# Patient Record
Sex: Male | Born: 1975
Health system: Southern US, Community
[De-identification: ages and names within clinical notes are randomized; demographics above are authoritative.]

## PROBLEM LIST (undated history)

## (undated) DIAGNOSIS — I10 Essential (primary) hypertension: Secondary | ICD-10-CM

## (undated) DIAGNOSIS — J309 Allergic rhinitis, unspecified: Secondary | ICD-10-CM

## (undated) DIAGNOSIS — E785 Hyperlipidemia, unspecified: Secondary | ICD-10-CM

## (undated) DIAGNOSIS — I509 Heart failure, unspecified: Secondary | ICD-10-CM

## (undated) DIAGNOSIS — M7660 Achilles tendinitis, unspecified leg: Secondary | ICD-10-CM

## (undated) DIAGNOSIS — M722 Plantar fascial fibromatosis: Secondary | ICD-10-CM

## (undated) DIAGNOSIS — E669 Obesity, unspecified: Secondary | ICD-10-CM

## (undated) DIAGNOSIS — I428 Other cardiomyopathies: Secondary | ICD-10-CM

## (undated) DIAGNOSIS — G4730 Sleep apnea, unspecified: Secondary | ICD-10-CM

## (undated) DIAGNOSIS — K219 Gastro-esophageal reflux disease without esophagitis: Secondary | ICD-10-CM

## (undated) DIAGNOSIS — I2510 Atherosclerotic heart disease of native coronary artery without angina pectoris: Secondary | ICD-10-CM

## (undated) HISTORY — DX: Heart failure, unspecified: I50.9

## (undated) HISTORY — DX: Hyperlipidemia, unspecified: E78.5

## (undated) HISTORY — DX: Other cardiomyopathies: I42.8

## (undated) HISTORY — DX: Atherosclerotic heart disease of native coronary artery without angina pectoris: I25.10

## (undated) HISTORY — DX: Gastro-esophageal reflux disease without esophagitis: K21.9

## (undated) HISTORY — DX: Allergic rhinitis, unspecified: J30.9

## (undated) HISTORY — DX: Obesity, unspecified: E66.9

## (undated) HISTORY — PX: NO PAST SURGERIES: SHX2092

## (undated) HISTORY — DX: Plantar fascial fibromatosis: M72.2

## (undated) HISTORY — DX: Achilles tendinitis, unspecified leg: M76.60

## (undated) HISTORY — DX: Essential (primary) hypertension: I10

## (undated) HISTORY — DX: Sleep apnea, unspecified: G47.30

---

## 1999-09-11 ENCOUNTER — Encounter: Payer: Self-pay | Admitting: Emergency Medicine

## 1999-09-11 ENCOUNTER — Emergency Department (HOSPITAL_COMMUNITY): Admission: EM | Admit: 1999-09-11 | Discharge: 1999-09-11 | Payer: Self-pay | Admitting: Emergency Medicine

## 2000-02-25 ENCOUNTER — Emergency Department (HOSPITAL_COMMUNITY): Admission: EM | Admit: 2000-02-25 | Discharge: 2000-02-25 | Payer: Self-pay | Admitting: Emergency Medicine

## 2001-07-23 ENCOUNTER — Encounter: Payer: Self-pay | Admitting: Emergency Medicine

## 2001-07-23 ENCOUNTER — Emergency Department (HOSPITAL_COMMUNITY): Admission: EM | Admit: 2001-07-23 | Discharge: 2001-07-23 | Payer: Self-pay | Admitting: Emergency Medicine

## 2002-07-09 ENCOUNTER — Emergency Department (HOSPITAL_COMMUNITY): Admission: EM | Admit: 2002-07-09 | Discharge: 2002-07-09 | Payer: Self-pay | Admitting: Emergency Medicine

## 2002-07-09 ENCOUNTER — Encounter: Payer: Self-pay | Admitting: Emergency Medicine

## 2002-07-11 ENCOUNTER — Encounter: Payer: Self-pay | Admitting: Internal Medicine

## 2002-07-11 ENCOUNTER — Inpatient Hospital Stay (HOSPITAL_COMMUNITY): Admission: AD | Admit: 2002-07-11 | Discharge: 2002-07-14 | Payer: Self-pay | Admitting: Internal Medicine

## 2002-11-11 ENCOUNTER — Encounter: Admission: RE | Admit: 2002-11-11 | Discharge: 2002-11-11 | Payer: Self-pay | Admitting: Occupational Medicine

## 2002-11-11 ENCOUNTER — Encounter: Payer: Self-pay | Admitting: Occupational Medicine

## 2003-04-19 ENCOUNTER — Emergency Department (HOSPITAL_COMMUNITY): Admission: AD | Admit: 2003-04-19 | Discharge: 2003-04-19 | Payer: Self-pay | Admitting: Emergency Medicine

## 2003-05-11 ENCOUNTER — Emergency Department (HOSPITAL_COMMUNITY): Admission: AD | Admit: 2003-05-11 | Discharge: 2003-05-11 | Payer: Self-pay | Admitting: Family Medicine

## 2003-06-01 ENCOUNTER — Emergency Department (HOSPITAL_COMMUNITY): Admission: AD | Admit: 2003-06-01 | Discharge: 2003-06-01 | Payer: Self-pay | Admitting: Family Medicine

## 2003-06-02 ENCOUNTER — Emergency Department (HOSPITAL_COMMUNITY): Admission: AD | Admit: 2003-06-02 | Discharge: 2003-06-02 | Payer: Self-pay | Admitting: Family Medicine

## 2003-07-21 ENCOUNTER — Emergency Department (HOSPITAL_COMMUNITY): Admission: AD | Admit: 2003-07-21 | Discharge: 2003-07-21 | Payer: Self-pay | Admitting: Family Medicine

## 2004-03-05 ENCOUNTER — Emergency Department (HOSPITAL_COMMUNITY): Admission: EM | Admit: 2004-03-05 | Discharge: 2004-03-05 | Payer: Self-pay | Admitting: Emergency Medicine

## 2004-03-28 ENCOUNTER — Emergency Department (HOSPITAL_COMMUNITY): Admission: EM | Admit: 2004-03-28 | Discharge: 2004-03-28 | Payer: Self-pay | Admitting: Family Medicine

## 2004-05-09 ENCOUNTER — Emergency Department (HOSPITAL_COMMUNITY): Admission: EM | Admit: 2004-05-09 | Discharge: 2004-05-09 | Payer: Self-pay | Admitting: Family Medicine

## 2004-06-23 ENCOUNTER — Emergency Department (HOSPITAL_COMMUNITY): Admission: EM | Admit: 2004-06-23 | Discharge: 2004-06-23 | Payer: Self-pay | Admitting: Emergency Medicine

## 2004-06-30 ENCOUNTER — Ambulatory Visit: Payer: Self-pay | Admitting: Cardiology

## 2004-09-16 ENCOUNTER — Ambulatory Visit: Payer: Self-pay

## 2004-09-16 ENCOUNTER — Ambulatory Visit: Payer: Self-pay | Admitting: Internal Medicine

## 2004-09-17 ENCOUNTER — Ambulatory Visit: Payer: Self-pay

## 2004-09-21 ENCOUNTER — Ambulatory Visit: Payer: Self-pay | Admitting: Cardiology

## 2004-10-04 ENCOUNTER — Inpatient Hospital Stay (HOSPITAL_BASED_OUTPATIENT_CLINIC_OR_DEPARTMENT_OTHER): Admission: RE | Admit: 2004-10-04 | Discharge: 2004-10-04 | Payer: Self-pay | Admitting: Cardiology

## 2004-10-04 ENCOUNTER — Ambulatory Visit: Payer: Self-pay | Admitting: Cardiology

## 2004-10-07 ENCOUNTER — Inpatient Hospital Stay (HOSPITAL_BASED_OUTPATIENT_CLINIC_OR_DEPARTMENT_OTHER): Admission: RE | Admit: 2004-10-07 | Discharge: 2004-10-07 | Payer: Self-pay | Admitting: Cardiology

## 2004-10-07 ENCOUNTER — Ambulatory Visit: Payer: Self-pay | Admitting: Cardiology

## 2004-10-14 ENCOUNTER — Ambulatory Visit: Payer: Self-pay

## 2004-11-11 ENCOUNTER — Ambulatory Visit: Payer: Self-pay | Admitting: Cardiology

## 2005-01-30 ENCOUNTER — Emergency Department (HOSPITAL_COMMUNITY): Admission: EM | Admit: 2005-01-30 | Discharge: 2005-01-30 | Payer: Self-pay | Admitting: Family Medicine

## 2005-03-03 ENCOUNTER — Ambulatory Visit: Payer: Self-pay | Admitting: Internal Medicine

## 2005-03-21 ENCOUNTER — Ambulatory Visit: Payer: Self-pay | Admitting: Cardiology

## 2005-03-21 ENCOUNTER — Ambulatory Visit: Payer: Self-pay

## 2005-04-07 ENCOUNTER — Ambulatory Visit: Payer: Self-pay | Admitting: Internal Medicine

## 2005-06-17 ENCOUNTER — Emergency Department (HOSPITAL_COMMUNITY): Admission: EM | Admit: 2005-06-17 | Discharge: 2005-06-17 | Payer: Self-pay | Admitting: Family Medicine

## 2005-06-22 ENCOUNTER — Ambulatory Visit: Payer: Self-pay | Admitting: Cardiology

## 2005-07-06 ENCOUNTER — Ambulatory Visit: Payer: Self-pay

## 2005-08-10 ENCOUNTER — Ambulatory Visit: Payer: Self-pay | Admitting: Cardiology

## 2005-08-22 ENCOUNTER — Ambulatory Visit: Payer: Self-pay | Admitting: Internal Medicine

## 2005-12-30 ENCOUNTER — Emergency Department (HOSPITAL_COMMUNITY): Admission: EM | Admit: 2005-12-30 | Discharge: 2005-12-30 | Payer: Self-pay | Admitting: Family Medicine

## 2006-03-06 ENCOUNTER — Ambulatory Visit: Payer: Self-pay | Admitting: Cardiology

## 2006-04-06 ENCOUNTER — Ambulatory Visit: Payer: Self-pay | Admitting: Internal Medicine

## 2006-05-08 ENCOUNTER — Emergency Department (HOSPITAL_COMMUNITY): Admission: EM | Admit: 2006-05-08 | Discharge: 2006-05-08 | Payer: Self-pay | Admitting: Emergency Medicine

## 2006-07-19 ENCOUNTER — Ambulatory Visit: Payer: Self-pay | Admitting: Cardiology

## 2006-07-19 LAB — CONVERTED CEMR LAB
BUN: 12 mg/dL (ref 6–23)
CO2: 27 meq/L (ref 19–32)
Calcium: 8.8 mg/dL (ref 8.4–10.5)
Chloride: 105 meq/L (ref 96–112)
Creatinine, Ser: 1 mg/dL (ref 0.4–1.5)
GFR calc Af Amer: 111 mL/min
GFR calc non Af Amer: 92 mL/min
Glucose, Bld: 106 mg/dL — ABNORMAL HIGH (ref 70–99)
Potassium: 3.8 meq/L (ref 3.5–5.1)
Pro B Natriuretic peptide (BNP): 15 pg/mL (ref 0.0–100.0)
Sodium: 139 meq/L (ref 135–145)

## 2006-07-28 ENCOUNTER — Ambulatory Visit: Payer: Self-pay

## 2006-08-09 ENCOUNTER — Ambulatory Visit: Payer: Self-pay | Admitting: Endocrinology

## 2006-09-29 ENCOUNTER — Emergency Department (HOSPITAL_COMMUNITY): Admission: EM | Admit: 2006-09-29 | Discharge: 2006-09-29 | Payer: Self-pay | Admitting: Emergency Medicine

## 2006-10-20 ENCOUNTER — Ambulatory Visit: Payer: Self-pay | Admitting: Cardiology

## 2006-12-10 ENCOUNTER — Emergency Department (HOSPITAL_COMMUNITY): Admission: EM | Admit: 2006-12-10 | Discharge: 2006-12-10 | Payer: Self-pay | Admitting: Family Medicine

## 2007-03-12 ENCOUNTER — Encounter: Payer: Self-pay | Admitting: *Deleted

## 2007-03-12 DIAGNOSIS — I10 Essential (primary) hypertension: Secondary | ICD-10-CM

## 2007-03-12 DIAGNOSIS — E785 Hyperlipidemia, unspecified: Secondary | ICD-10-CM | POA: Insufficient documentation

## 2007-03-12 DIAGNOSIS — G4733 Obstructive sleep apnea (adult) (pediatric): Secondary | ICD-10-CM | POA: Insufficient documentation

## 2007-03-12 DIAGNOSIS — G4730 Sleep apnea, unspecified: Secondary | ICD-10-CM

## 2007-03-12 HISTORY — DX: Essential (primary) hypertension: I10

## 2007-03-12 HISTORY — DX: Hyperlipidemia, unspecified: E78.5

## 2007-03-12 HISTORY — DX: Sleep apnea, unspecified: G47.30

## 2007-05-30 ENCOUNTER — Ambulatory Visit: Payer: Self-pay | Admitting: Cardiology

## 2007-05-30 LAB — CONVERTED CEMR LAB
ALT: 38 units/L (ref 0–53)
AST: 26 units/L (ref 0–37)
Albumin: 3.7 g/dL (ref 3.5–5.2)
Alkaline Phosphatase: 59 units/L (ref 39–117)
BUN: 12 mg/dL (ref 6–23)
Bilirubin, Direct: 0.1 mg/dL (ref 0.0–0.3)
CO2: 29 meq/L (ref 19–32)
Calcium: 8.6 mg/dL (ref 8.4–10.5)
Chloride: 104 meq/L (ref 96–112)
Cholesterol: 178 mg/dL (ref 0–200)
Creatinine, Ser: 1 mg/dL (ref 0.4–1.5)
GFR calc Af Amer: 111 mL/min
GFR calc non Af Amer: 91 mL/min
Glucose, Bld: 102 mg/dL — ABNORMAL HIGH (ref 70–99)
HDL: 33.9 mg/dL — ABNORMAL LOW (ref >39.0)
LDL Cholesterol: 133 mg/dL — ABNORMAL HIGH (ref 0–99)
Potassium: 4.1 meq/L (ref 3.5–5.1)
Sodium: 139 meq/L (ref 135–145)
Total Bilirubin: 0.7 mg/dL (ref 0.3–1.2)
Total CHOL/HDL Ratio: 5.3
Total Protein: 7 g/dL (ref 6.0–8.3)
Triglycerides: 58 mg/dL (ref 0–149)
VLDL: 12 mg/dL (ref 0–40)

## 2007-12-04 ENCOUNTER — Ambulatory Visit: Payer: Self-pay | Admitting: Internal Medicine

## 2007-12-04 DIAGNOSIS — I509 Heart failure, unspecified: Secondary | ICD-10-CM | POA: Insufficient documentation

## 2007-12-04 DIAGNOSIS — I2510 Atherosclerotic heart disease of native coronary artery without angina pectoris: Secondary | ICD-10-CM

## 2007-12-04 HISTORY — DX: Heart failure, unspecified: I50.9

## 2007-12-04 HISTORY — DX: Atherosclerotic heart disease of native coronary artery without angina pectoris: I25.10

## 2007-12-09 ENCOUNTER — Emergency Department (HOSPITAL_COMMUNITY): Admission: EM | Admit: 2007-12-09 | Discharge: 2007-12-09 | Payer: Self-pay | Admitting: Emergency Medicine

## 2008-04-17 ENCOUNTER — Ambulatory Visit: Payer: Self-pay | Admitting: Internal Medicine

## 2008-04-28 ENCOUNTER — Ambulatory Visit: Payer: Self-pay | Admitting: Cardiology

## 2008-05-12 ENCOUNTER — Ambulatory Visit: Payer: Self-pay

## 2008-05-12 ENCOUNTER — Encounter: Payer: Self-pay | Admitting: Cardiology

## 2008-07-30 ENCOUNTER — Telehealth (INDEPENDENT_AMBULATORY_CARE_PROVIDER_SITE_OTHER): Payer: Self-pay | Admitting: *Deleted

## 2008-09-07 ENCOUNTER — Emergency Department (HOSPITAL_BASED_OUTPATIENT_CLINIC_OR_DEPARTMENT_OTHER): Admission: EM | Admit: 2008-09-07 | Discharge: 2008-09-07 | Payer: Self-pay | Admitting: Emergency Medicine

## 2008-09-17 ENCOUNTER — Ambulatory Visit: Payer: Self-pay | Admitting: Cardiology

## 2008-09-17 LAB — CONVERTED CEMR LAB
BUN: 14 mg/dL (ref 6–23)
CO2: 28 meq/L (ref 19–32)
Calcium: 8.8 mg/dL (ref 8.4–10.5)
Chloride: 108 meq/L (ref 96–112)
Creatinine, Ser: 0.9 mg/dL (ref 0.4–1.5)
GFR calc non Af Amer: 123.65 mL/min (ref >60)
Glucose, Bld: 77 mg/dL (ref 70–99)
Potassium: 3.8 meq/L (ref 3.5–5.1)
Sodium: 141 meq/L (ref 135–145)

## 2008-10-16 ENCOUNTER — Ambulatory Visit: Payer: Self-pay | Admitting: Internal Medicine

## 2008-10-16 DIAGNOSIS — M7660 Achilles tendinitis, unspecified leg: Secondary | ICD-10-CM | POA: Insufficient documentation

## 2008-10-16 HISTORY — DX: Achilles tendinitis, unspecified leg: M76.60

## 2008-10-21 ENCOUNTER — Encounter: Payer: Self-pay | Admitting: Internal Medicine

## 2008-10-21 LAB — CONVERTED CEMR LAB
ALT: 32 units/L (ref 0–53)
AST: 25 units/L (ref 0–37)
Albumin: 3.7 g/dL (ref 3.5–5.2)
Alkaline Phosphatase: 71 units/L (ref 39–117)
BUN: 9 mg/dL (ref 6–23)
Basophils Absolute: 0.1 10*3/uL (ref 0.0–0.1)
Basophils Relative: 0.7 % (ref 0.0–3.0)
Bilirubin Urine: NEGATIVE
Bilirubin, Direct: 0.1 mg/dL (ref 0.0–0.3)
CO2: 29 meq/L (ref 19–32)
Calcium: 8.7 mg/dL (ref 8.4–10.5)
Chloride: 109 meq/L (ref 96–112)
Cholesterol: 228 mg/dL — ABNORMAL HIGH (ref 0–200)
Creatinine, Ser: 0.9 mg/dL (ref 0.4–1.5)
Direct LDL: 164.4 mg/dL
Eosinophils Absolute: 0.4 10*3/uL (ref 0.0–0.7)
Eosinophils Relative: 4.7 % (ref 0.0–5.0)
GFR calc non Af Amer: 123.59 mL/min (ref >60)
Glucose, Bld: 76 mg/dL (ref 70–99)
HCT: 42.3 % (ref 39.0–52.0)
HDL: 28.8 mg/dL — ABNORMAL LOW (ref >39.00)
Hemoglobin: 14.4 g/dL (ref 13.0–17.0)
Ketones, ur: NEGATIVE mg/dL
Leukocytes, UA: NEGATIVE
Lymphocytes Relative: 32.7 % (ref 12.0–46.0)
Lymphs Abs: 2.6 10*3/uL (ref 0.7–4.0)
MCHC: 34.1 g/dL (ref 30.0–36.0)
MCV: 87.7 fL (ref 78.0–100.0)
Monocytes Absolute: 0.9 10*3/uL (ref 0.1–1.0)
Monocytes Relative: 11 % (ref 3.0–12.0)
Neutro Abs: 4.1 10*3/uL (ref 1.4–7.7)
Neutrophils Relative %: 50.9 % (ref 43.0–77.0)
Nitrite: NEGATIVE
Platelets: 213 10*3/uL (ref 150.0–400.0)
Potassium: 3.7 meq/L (ref 3.5–5.1)
RBC: 4.82 M/uL (ref 4.22–5.81)
RDW: 13.1 % (ref 11.5–14.6)
Sodium: 142 meq/L (ref 135–145)
Specific Gravity, Urine: 1.02 (ref 1.000–1.030)
TSH: 3.12 microintl units/mL (ref 0.35–5.50)
Total Bilirubin: 0.7 mg/dL (ref 0.3–1.2)
Total CHOL/HDL Ratio: 8
Total Protein, Urine: NEGATIVE mg/dL
Total Protein: 7.3 g/dL (ref 6.0–8.3)
Triglycerides: 178 mg/dL — ABNORMAL HIGH (ref 0.0–149.0)
Urine Glucose: NEGATIVE mg/dL
Urobilinogen, UA: 0.2 (ref 0.0–1.0)
VLDL: 35.6 mg/dL (ref 0.0–40.0)
WBC: 8.1 10*3/uL (ref 4.5–10.5)
pH: 6.5 (ref 5.0–8.0)

## 2008-11-24 ENCOUNTER — Telehealth: Payer: Self-pay | Admitting: Cardiology

## 2008-12-16 ENCOUNTER — Telehealth: Payer: Self-pay | Admitting: Cardiology

## 2009-01-06 ENCOUNTER — Ambulatory Visit: Payer: Self-pay | Admitting: Internal Medicine

## 2009-02-12 ENCOUNTER — Encounter (INDEPENDENT_AMBULATORY_CARE_PROVIDER_SITE_OTHER): Payer: Self-pay | Admitting: *Deleted

## 2009-04-01 ENCOUNTER — Ambulatory Visit: Payer: Self-pay | Admitting: Internal Medicine

## 2009-04-03 ENCOUNTER — Ambulatory Visit (HOSPITAL_COMMUNITY): Admission: RE | Admit: 2009-04-03 | Discharge: 2009-04-03 | Payer: Self-pay | Admitting: Internal Medicine

## 2009-04-21 DIAGNOSIS — E669 Obesity, unspecified: Secondary | ICD-10-CM

## 2009-04-21 DIAGNOSIS — I428 Other cardiomyopathies: Secondary | ICD-10-CM

## 2009-04-21 HISTORY — DX: Obesity, unspecified: E66.9

## 2009-04-21 HISTORY — DX: Other cardiomyopathies: I42.8

## 2009-05-09 ENCOUNTER — Ambulatory Visit: Payer: Self-pay | Admitting: Family Medicine

## 2009-05-11 ENCOUNTER — Ambulatory Visit: Payer: Self-pay | Admitting: Cardiology

## 2009-05-12 LAB — CONVERTED CEMR LAB
BUN: 11 mg/dL (ref 6–23)
CO2: 29 meq/L (ref 19–32)
Calcium: 8.5 mg/dL (ref 8.4–10.5)
Chloride: 106 meq/L (ref 96–112)
Creatinine, Ser: 0.9 mg/dL (ref 0.4–1.5)
GFR calc non Af Amer: 123.19 mL/min (ref >60)
Glucose, Bld: 118 mg/dL — ABNORMAL HIGH (ref 70–99)
Potassium: 3.6 meq/L (ref 3.5–5.1)
Sodium: 142 meq/L (ref 135–145)

## 2009-06-22 ENCOUNTER — Telehealth (INDEPENDENT_AMBULATORY_CARE_PROVIDER_SITE_OTHER): Payer: Self-pay | Admitting: *Deleted

## 2009-06-24 ENCOUNTER — Telehealth: Payer: Self-pay | Admitting: Cardiology

## 2009-08-13 ENCOUNTER — Ambulatory Visit: Payer: Self-pay | Admitting: Internal Medicine

## 2009-08-13 DIAGNOSIS — K5289 Other specified noninfective gastroenteritis and colitis: Secondary | ICD-10-CM | POA: Insufficient documentation

## 2009-08-13 DIAGNOSIS — J309 Allergic rhinitis, unspecified: Secondary | ICD-10-CM

## 2009-08-13 HISTORY — DX: Allergic rhinitis, unspecified: J30.9

## 2009-08-13 LAB — CONVERTED CEMR LAB
ALT: 28 units/L (ref 0–53)
AST: 20 units/L (ref 0–37)
Albumin: 3.7 g/dL (ref 3.5–5.2)
Alkaline Phosphatase: 70 units/L (ref 39–117)
BUN: 8 mg/dL (ref 6–23)
Basophils Absolute: 0 10*3/uL (ref 0.0–0.1)
Basophils Relative: 0.4 % (ref 0.0–3.0)
Bilirubin, Direct: 0.1 mg/dL (ref 0.0–0.3)
CO2: 30 meq/L (ref 19–32)
Calcium: 8.5 mg/dL (ref 8.4–10.5)
Chloride: 108 meq/L (ref 96–112)
Cholesterol, target level: 200 mg/dL
Creatinine, Ser: 0.9 mg/dL (ref 0.4–1.5)
Eosinophils Absolute: 0.4 10*3/uL (ref 0.0–0.7)
Eosinophils Relative: 6.9 % — ABNORMAL HIGH (ref 0.0–5.0)
GFR calc non Af Amer: 123.01 mL/min (ref >60)
Glucose, Bld: 101 mg/dL — ABNORMAL HIGH (ref 70–99)
HCT: 42.5 % (ref 39.0–52.0)
HDL goal, serum: 40 mg/dL
Hemoglobin: 13.8 g/dL (ref 13.0–17.0)
LDL Goal: 100 mg/dL
Lymphocytes Relative: 33.1 % (ref 12.0–46.0)
Lymphs Abs: 2.1 10*3/uL (ref 0.7–4.0)
MCHC: 32.5 g/dL (ref 30.0–36.0)
MCV: 88.1 fL (ref 78.0–100.0)
Monocytes Absolute: 0.7 10*3/uL (ref 0.1–1.0)
Monocytes Relative: 10.8 % (ref 3.0–12.0)
Neutro Abs: 3.1 10*3/uL (ref 1.4–7.7)
Neutrophils Relative %: 48.8 % (ref 43.0–77.0)
Platelets: 201 10*3/uL (ref 150.0–400.0)
Potassium: 3.8 meq/L (ref 3.5–5.1)
RBC: 4.82 M/uL (ref 4.22–5.81)
RDW: 13.4 % (ref 11.5–14.6)
Sodium: 142 meq/L (ref 135–145)
Total Bilirubin: 0.4 mg/dL (ref 0.3–1.2)
Total Protein: 7.2 g/dL (ref 6.0–8.3)
WBC: 6.3 10*3/uL (ref 4.5–10.5)

## 2009-08-14 ENCOUNTER — Encounter: Payer: Self-pay | Admitting: Cardiology

## 2009-10-01 ENCOUNTER — Emergency Department (HOSPITAL_COMMUNITY): Admission: EM | Admit: 2009-10-01 | Discharge: 2009-10-01 | Payer: Self-pay | Admitting: Family Medicine

## 2009-12-23 ENCOUNTER — Emergency Department (HOSPITAL_COMMUNITY): Admission: EM | Admit: 2009-12-23 | Discharge: 2009-12-23 | Payer: Self-pay | Admitting: Emergency Medicine

## 2010-01-07 ENCOUNTER — Encounter (INDEPENDENT_AMBULATORY_CARE_PROVIDER_SITE_OTHER): Payer: Self-pay | Admitting: *Deleted

## 2010-01-07 ENCOUNTER — Ambulatory Visit: Payer: Self-pay | Admitting: Internal Medicine

## 2010-01-07 DIAGNOSIS — J019 Acute sinusitis, unspecified: Secondary | ICD-10-CM | POA: Insufficient documentation

## 2010-02-16 ENCOUNTER — Ambulatory Visit: Payer: Self-pay | Admitting: Internal Medicine

## 2010-03-09 ENCOUNTER — Encounter: Payer: Self-pay | Admitting: Endocrinology

## 2010-03-09 ENCOUNTER — Ambulatory Visit: Payer: Self-pay | Admitting: Internal Medicine

## 2010-03-09 DIAGNOSIS — I889 Nonspecific lymphadenitis, unspecified: Secondary | ICD-10-CM | POA: Insufficient documentation

## 2010-03-12 ENCOUNTER — Emergency Department (HOSPITAL_COMMUNITY): Admission: EM | Admit: 2010-03-12 | Discharge: 2010-03-13 | Payer: Self-pay | Admitting: Emergency Medicine

## 2010-03-13 ENCOUNTER — Encounter (INDEPENDENT_AMBULATORY_CARE_PROVIDER_SITE_OTHER): Payer: Self-pay | Admitting: Emergency Medicine

## 2010-03-13 ENCOUNTER — Ambulatory Visit: Payer: Self-pay | Admitting: Vascular Surgery

## 2010-03-13 ENCOUNTER — Ambulatory Visit (HOSPITAL_COMMUNITY): Admission: RE | Admit: 2010-03-13 | Discharge: 2010-03-13 | Payer: Self-pay | Admitting: Emergency Medicine

## 2010-03-15 ENCOUNTER — Telehealth: Payer: Self-pay | Admitting: Internal Medicine

## 2010-04-28 ENCOUNTER — Emergency Department (HOSPITAL_COMMUNITY): Admission: EM | Admit: 2010-04-28 | Discharge: 2010-04-28 | Payer: Self-pay | Admitting: Family Medicine

## 2010-07-20 NOTE — Assessment & Plan Note (Signed)
Summary: food poisoning?/feels awful-lb   Vital Signs:  Patient profile:   35 year old male Height:      69 inches Weight:      383 pounds BMI:     56.76 O2 Sat:      97 % on Room air Temp:     96.8 degrees F oral Pulse rate:   60 / minute Pulse rhythm:   regular Resp:     16 per minute BP sitting:   118 / 82  (left arm) Cuff size:   large  Vitals Entered By: Rock Nephew CMA (August 13, 2009 9:45 AM)  Nutrition Counseling: Patient's BMI is greater than 25 and therefore counseled on weight management options.  O2 Flow:  Room air CC: vomiting, diarrhea, fatigue, bodyache x sunday, Hypertension Management, Lipid Management Is Patient Diabetic? No   Primary Care Provider:  [name] MD  CC:  vomiting, diarrhea, fatigue, bodyache x sunday, Hypertension Management, and Lipid Management.  History of Present Illness: New to me he complains that 4 days ago he developed N/V/D and has missed work. Those symptoms have resolved but he still has muscle aches and fatigue.   He is also having a flare of nasal allergies with nasal congestion and post-nasal drip.  Hypertension History:      He denies headache, chest pain, palpitations, dyspnea with exertion, orthopnea, PND, peripheral edema, visual symptoms, neurologic problems, syncope, and side effects from treatment.  He notes no problems with any antihypertensive medication side effects.        Positive major cardiovascular risk factors include hyperlipidemia and hypertension.  Negative major cardiovascular risk factors include male age less than 48 years old, no history of diabetes, negative family history for ischemic heart disease, and non-tobacco-user status.        Positive history for target organ damage include ASHD (either angina/prior MI/prior CABG) and cardiac end organ damage (either CHF or LVH).  Further assessment for target organ damage reveals no history of stroke/TIA, peripheral vascular disease, renal insufficiency,  or hypertensive retinopathy.    Lipid Management History:      Positive NCEP/ATP III risk factors include HDL cholesterol less than 40, hypertension, and ASHD (either angina/prior MI/prior CABG).  Negative NCEP/ATP III risk factors include male age less than 65 years old, non-diabetic, no family history for ischemic heart disease, non-tobacco-user status, no prior stroke/TIA, no peripheral vascular disease, and no history of aortic aneurysm.        The patient states that he knows about the "Therapeutic Lifestyle Change" diet.  His compliance with the TLC diet is not at all.  The patient expresses understanding of adjunctive measures for cholesterol lowering.  Adjunctive measures started by the patient include limit alcohol consumpton.  He expresses no side effects from his lipid-lowering medication.  The patient denies any symptoms to suggest myopathy or liver disease.      Current Medications (verified): 1)  Adult Aspirin Low Strength 81 Mg  Tbdp (Aspirin) .... Take 1 By Mouth Qd 2)  Diovan 160 Mg  Tabs (Valsartan) .... Take 1 By Mouth Two Times A Day Qd 3)  Furosemide 20 Mg  Tabs (Furosemide) .... 2 Tabs By Mouth Once Daily 4)  Norvasc 5 Mg  Tabs (Amlodipine Besylate) .... Take 1 By Mouth Qd 5)  Coreg 25 Mg  Tabs (Carvedilol) .... Take 2 By Mouth Two Times A Day Qd 6)  Elocon 0.1 %  Crea (Mometasone Furoate) .... Use Asd Two Times A Day  As Needed 7)  Tussionex Pennkinetic Er 8-10 Mg/68ml Lqcr (Chlorpheniramine-Hydrocodone) .Marland Kitchen.. 1 Tsp Two Times A Day As Needed Cough  Allergies (verified): 1)  Ace Inhibitors  Past History:  Past Medical History: Current Problems:  CARDIOMYOPATHY (ICD-425.4) CONGESTIVE HEART FAILURE (ICD-428.0) HYPERTENSION (ICD-401.9) HYPERLIPIDEMIA (ICD-272.4) SLEEP APNEA (ICD-780.57) Cough due to Ace Inhibitors restrictive lung disease Allergic rhinitis  Family History: Reviewed history from 12/04/2007 and no changes required. brother with asthma both parents  with HTN, CAD/MI  Social History: Reviewed history from 12/04/2007 and no changes required. 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   ENT:  Complains of nasal congestion and postnasal drainage; denies decreased hearing, difficulty swallowing, ear discharge, earache, hoarseness, nosebleeds, ringing in ears, sinus pressure, and sore throat. GI:  Complains of diarrhea; denies abdominal pain, bloody stools, change in bowel habits, indigestion, loss of appetite, nausea, vomiting, vomiting blood, and yellowish skin color.  Physical Exam  General:  alert, well-developed, well-nourished, well-hydrated, appropriate dress, cooperative to examination, good hygiene, and overweight-appearing.   Head:  normocephalic, atraumatic, no abnormalities observed, and no abnormalities palpated.   Eyes:  No corneal or conjunctival inflammation noted. EOMI. Perrla. Funduscopic exam benign, without hemorrhages, exudates or papilledema. Vision grossly normal. Ears:  R ear normal and L ear normal.   Nose:  no external erythema, no airflow obstruction, no intranasal foreign body, no nasal polyps, no nasal mucosal lesions, no mucosal friability, no active bleeding or clots, no sinus percussion tenderness, no septum abnormalities, nasal dischargemucosal pallor, and mucosal edema.   Mouth:  Oral mucosa and oropharynx without lesions or exudates.  Teeth in good repair. Neck:  supple, full ROM, no masses, no thyromegaly, no JVD, normal carotid upstroke, and no cervical lymphadenopathy.   Lungs:  normal respiratory effort, no intercostal retractions, no accessory muscle use, normal  breath sounds, no dullness, no fremitus, no crackles, and no wheezes.   Heart:  normal rate, regular rhythm, no murmur, no gallop, no rub, and no JVD.   Abdomen:  soft, non-tender, normal bowel sounds, no distention, no masses, no guarding, no rigidity, no rebound tenderness, no abdominal hernia, no inguinal hernia, no hepatomegaly, and no splenomegaly.   Msk:  No deformity or scoliosis noted of thoracic or lumbar spine.   Pulses:  R and L carotid,radial,femoral,dorsalis pedis and posterior tibial pulses are full and equal bilaterally Extremities:  No clubbing, cyanosis, edema, or deformity noted with normal full range of motion of all joints.   Neurologic:  No cranial nerve deficits noted. Station and gait are normal. Plantar reflexes are down-going bilaterally. DTRs are symmetrical throughout. Sensory, motor and coordinative functions appear intact. Skin:  Intact without suspicious lesions or rashes Cervical Nodes:  no anterior cervical adenopathy and no posterior cervical adenopathy.   Axillary Nodes:  no R axillary adenopathy and no L axillary adenopathy.   Inguinal Nodes:  no R inguinal adenopathy and no L inguinal adenopathy.   Psych:  Cognition and judgment appear intact. Alert and cooperative with normal attention span and concentration. No apparent delusions, illusions, hallucinations   Impression & Recommendations:  Problem # 1:  ALLERGIC RHINITIS (ICD-477.9)  he will not use a steroid nasal spray so will offer depo-medrol today and he'll continue the otc anti-histamine  Discussed  use of allergy medications and environmental measures.   Orders: Admin of Therapeutic Inj  intramuscular or subcutaneous ([address]) Depo- Medrol 40mg  (J1030)  Problem # 2:  GASTROENTERITIS (ICD-558.9) Assessment: New this sounds viral in nature so will treat supportively Orders: Venipuncture (60454) TLB-BMP (Basic Metabolic Panel-BMET) (80048-METABOL) TLB-CBC Platelet - w/Differential  (85025-CBCD) TLB-Hepatic/Liver Function Pnl (80076-HEPATIC) Depo- Medrol 80mg  (J1040)  Discussed use of medication and role of diet. Encouraged clear liquids and electrolyte replacement fluids. Instructed to call if any signs of worsening dehydration.   Problem # 3:  HYPERTENSION (ICD-401.9) Assessment: Improved  His updated medication list for this problem includes:    Diovan 160 Mg Tabs (Valsartan) .Marland Kitchen... Take 1 by mouth two times a day qd    Furosemide 20 Mg Tabs (Furosemide) .Marland Kitchen... 2 tabs by mouth once daily    Norvasc 5 Mg Tabs (Amlodipine besylate) .Marland Kitchen... Take 1 by mouth qd    Coreg 25 Mg Tabs (Carvedilol) .Marland Kitchen... Take 2 by mouth two times a day qd  Orders: Venipuncture ([address]) TLB-BMP (Basic Metabolic Panel-BMET) (80048-METABOL) TLB-CBC Platelet - w/Differential (85025-CBCD) TLB-Hepatic/Liver Function Pnl (80076-HEPATIC)  BP today: 118/82 Prior BP: 124/80 (05/11/2009)  Labs Reviewed: K+: 3.6 (05/11/2009) Creat: : 0.9 (05/11/2009)   Chol: 228 (10/16/2008)   HDL: 28.80 (10/16/2008)   LDL: 133 (05/30/2007)   TG: 178.0 (10/16/2008)  Complete Medication List: 1)  Adult Aspirin Low Strength 81 Mg Tbdp (Aspirin) .... Take 1 by mouth qd 2)  Diovan 160 Mg Tabs (Valsartan) .... Take 1 by mouth two times a day qd 3)  Furosemide 20 Mg Tabs (Furosemide) .... 2 tabs by mouth once daily 4)  Norvasc 5 Mg Tabs (Amlodipine besylate) .... Take 1 by mouth qd 5)  Coreg 25 Mg Tabs (Carvedilol) .... Take 2 by mouth two times a day qd 6)  Elocon 0.1 % Crea (Mometasone furoate) .... Use asd two times a day as needed  Hypertension Assessment/Plan:      The patient's hypertensive risk group is category C: Target organ damage and/or diabetes.  Today's blood pressure is 118/82.  His blood pressure goal is < 140/90.  Lipid Assessment/Plan:      Based on NCEP/ATP III, the patient's risk factor category is "history of coronary disease, peripheral vascular disease, cerebrovascular disease, or aortic  aneurysm".  The patient's lipid goals are as follows: Total cholesterol goal is 200; LDL cholesterol goal is 100; HDL cholesterol goal is 40; Triglyceride goal is 150.    Patient Instructions: 1)  Please schedule a follow-up appointment in 2 weeks. 2)  It is important that you exercise regularly at least 20 minutes 5 times a week. If you develop chest pain, have severe difficulty breathing, or feel very tired , stop exercising immediately and seek medical attention. 3)  You need to lose weight. Consider a lower calorie diet and regular exercise.  4)  Check your Blood Pressure regularly. If it is above 130/80: you should make an appointment. 5)  teh main problem with gastroenteritis is dehydration. Drink plenty of fluids and take solids as you feel better. If you are unable to keep anything down and/or you show signs of dehydration(dry/cracked lips, lack of tears, not urinating, very sleepy), call our office.   Medication Administration  Injection # 1:    Medication: Depo- Medrol 80mg     Diagnosis: GASTROENTERITIS (ICD-558.9)    Route: IM    Site: R deltoid    Exp Date: 02/2012    Lot #: Franchot Heidelberg    Mfr:  pfizer    Patient tolerated injection without complications    Given by: Rock Nephew CMA (August 13, 2009 10:14 AM)  Injection # 2:    Medication: Depo- Medrol 40mg     Diagnosis: ALLERGIC RHINITIS (ICD-477.9)    Route: IM    Site: R deltoid    Exp Date: 02/2012    Lot #: Bud Face    Mfr: pfizer    Patient tolerated injection without complications    Given by: Rock Nephew CMA (August 13, 2009 10:14 AM)  Orders Added: 1)  Venipuncture [40981] 2)  TLB-BMP (Basic Metabolic Panel-BMET) [80048-METABOL] 3)  TLB-CBC Platelet - w/Differential [85025-CBCD] 4)  TLB-Hepatic/Liver Function Pnl [80076-HEPATIC] 5)  Admin of Therapeutic Inj  intramuscular or subcutaneous [96372] 6)  Depo- Medrol 40mg  [J1030] 7)  Depo- Medrol 80mg  [J1040] 8)  Est. Patient Level V [19147]

## 2010-07-20 NOTE — Letter (Signed)
Summary: Out of Work  [facility]  [address] [place], [place] [address]   Phone: [phone]  Fax: [phone]    January 07, 2010   Employee:  [name], Montez Hageman    To Whom It May Concern:   For Medical reasons, please excuse the above named employee from work for the following dates:  Start:   01/07/2010  End:   01/08/2010  If you need additional information, please feel free to contact our office.         Sincerely,    Dr. Oliver Barre

## 2010-07-20 NOTE — Progress Notes (Signed)
Summary: REFILLMEDS  Medications Added LANOXIN 0.125 MG TABS (DIGOXIN)  LASIX 20 MG TABS (FUROSEMIDE)  NORVASC 10 MG TABS (AMLODIPINE BESYLATE)  TUSSIONEX PENNKINETIC ER 8-10 MG/5ML LQCR (CHLORPHENIRAMINE-HYDROCODONE) Apply 1 teaspoon by mouth twice a day       Phone Note Refill Request Call back at Home Phone [phone] Message from:  Patient on November 24, 2008 9:58 AM  Refills Requested: Medication #1:  NORVASC 5 MG  TABS take 1 by mouth qd [facility] [address]    Method Requested: Fax to Local Pharmacy Initial call taken by: Lorne Skeens,  November 24, 2008 9:58 AM    New/Updated Medications: LANOXIN 0.125 MG TABS (DIGOXIN)  LASIX 20 MG TABS (FUROSEMIDE)  NORVASC 10 MG TABS (AMLODIPINE BESYLATE)  TUSSIONEX PENNKINETIC ER 8-10 MG/5ML LQCR (CHLORPHENIRAMINE-HYDROCODONE) Apply 1 teaspoon by mouth twice a day   Prescriptions: NORVASC 5 MG  TABS (AMLODIPINE BESYLATE) take 1 by mouth qd  #30 x 12   Entered by:   Kem Parkinson   Authorized by:   Ferman Hamming, MD, [facility]   Signed by:   Kem Parkinson on 11/24/2008   Method used:   Electronically to        [facility] (retail)       [address]       [place], [place]  [address]       Ph: [phone]       Fax: [phone]   RxID:   [id number]

## 2010-07-20 NOTE — Progress Notes (Signed)
Summary: Call Report  Phone Note Other Incoming   Caller: [facility] Summary of Call: [facility] Triage Call Report Triage Record Num: [id number] Operator: Frederico Hamman Patient Name: Tyler [name]. Call Date & Time: 03/12/2010 9:52:43PM Patient Phone: [phone] PCP: Patient Gender: Male PCP Fax : Patient DOB: Apr 21, 1976 Practice Name: [facility] Reason for Call: Wife/ Latoya states front leg from just below knee to ankle is red and warm- onset 9/22. Muscle aches on 9/23. Leg slightly swollen. Being treated for enlarge lymph node- was seen in office 9/21.C/o tightness when standing. Advised to be seen in ED and follow up with office . Care advice given. Protocol(s) Used: Leg Non-Injury Recommended Outcome per Protocol: See ED Immediately Reason for Outcome: Recent onset of one-sided pain, tenderness, or aching that may worsen with standing or walking OR a cord-like swelling that may feel warm, look red or discolored. Care Advice:  ~ Another adult should drive.  ~ Call EMS [phone] if chest pain or shortness of breath develops; or begins coughing or spitting up blood.  ~ Do not massage affected area if red, swollen, warm, or tender to touch OR if history of blood clots.  ~ Elevate part to improve circulation and decrease discomfort.  ~ IMMEDIATE ACTION Write down provider's name. List or place the following in a bag for transport with the patient: current prescription and/or nonprescription medications; alternative treatments, therapies and medications; and street drugs.  ~ 09/ Initial call taken by: Margaret Pyle, CMA,  March 15, 2010 9:13 AM  Follow-up for Phone Call        noted Follow-up by: [name] MD,  March 15, 2010 2:10 PM

## 2010-07-20 NOTE — Letter (Signed)
Summary: Generic Letter  [facility]  [address] [place], [place] [address]   Phone: [phone]  Fax: [phone]    10/21/2008  STARR ENGEL [address] [place], [place]  [address]  Dear [name],  We have tried several times to contact you at the home number you have listed on your chart and have been unsuccesful. We would like to give you the following information on behalf of Dr. Jonny Ruiz:  Your recent labs were negative and normal  with No Acute problems, but there is a marked elevated cholesterol. So Dr. Jonny Ruiz would like you to do the following       1)   start a medication called "simvastatin" 40 mg per day - which I have a prescription here for           you in the office to pick up.           up        2)  After the medication has been started come back and have follow up labs in 4 wks. Please           call our office at [phone] and schedule these future labs.          Sincerely,   Windell Norfolk

## 2010-07-20 NOTE — Letter (Signed)
Summary: Out of Work  [facility]  [address] [place], [place] [address]   Phone: [phone]  Fax: [phone]      August 13, 2009   Employee:  [name], Montez Hageman    To Whom It May Concern:   For Medical reasons, please excuse the above named employee from work for the following dates:  Start:   08/10/2009  End:   08/17/2009  If you need additional information, please feel free to contact our office.         Sincerely,    Etta Grandchild MD

## 2010-07-20 NOTE — Assessment & Plan Note (Signed)
Summary: LUMP ON SIDE OF FACE/NWS  #   Vital Signs:  Patient profile:   35 year old male Height:      69 inches Weight:      379.50 pounds BMI:     56.24 O2 Sat:      96 % on Room air Temp:     97.9 degrees F oral Pulse rate:   71 / minute BP sitting:   138 / 82  (left arm) Cuff size:   large  Vitals Entered By: Margaret Pyle, CMA (March 09, 2010 5:09 PM)  O2 Flow:  Room air  CC: Swollen glands RT face   Primary Provider:  [name] MD  CC:  Swollen glands RT face.  History of Present Illness: pt states few days of slight swelling at the right pre-auricular area, and assoc pain.  no recent injury or dental work.    Allergies: 1)  Ace Inhibitors  Past History:  Past Medical History: Last updated: 08/13/2009 Current Problems:  CARDIOMYOPATHY (ICD-425.4) CONGESTIVE HEART FAILURE (ICD-428.0) HYPERTENSION (ICD-401.9) HYPERLIPIDEMIA (ICD-272.4) SLEEP APNEA (ICD-780.57) Cough due to Ace Inhibitors restrictive lung disease Allergic rhinitis  Review of Systems  The patient denies fever.         denies dysphagia  Physical Exam  General:  morbidly obese.  no distress  Head:  head: no deformity eyes: no periorbital swelling, no proptosis external nose and ears are normal mouth: no lesion seen there is slight swelling and tenderness at the right pre-auricular area. Ears:  right eac and tm are normal Neck:  Supple without thyroid enlargement or tenderness.  Cervical Nodes:  No significant adenopathy.    Impression & Recommendations:  Problem # 1:  LYMPHADENITIS (ICD-289.3) Assessment New uncertain etiology  Complete Medication List: 1)  Adult Aspirin Low Strength 81 Mg Tbdp (Aspirin) .... Take 1 by mouth qd 2)  Diovan 160 Mg Tabs (Valsartan) .... Take 1 by mouth two times a day qd 3)  Furosemide 20 Mg Tabs (Furosemide) .... 2 tabs by mouth once daily 4)  Norvasc 5 Mg Tabs (Amlodipine besylate) .... Take 1 by mouth qd 5)  Coreg 25 Mg Tabs  (Carvedilol) .... 2 tabs by mouth two times a day 6)  Elocon 0.1 % Crea (Mometasone furoate) .... Use asd two times a day as needed 7)  Doxycycline Hyclate 100 Mg Tabs (Doxycycline hyclate) .Marland Kitchen.. 1 tab two times a day  Other Orders: Admin 1st Vaccine (54098) Flu Vaccine 31yrs + (11914) Est. Patient Level III ([address])  Patient Instructions: 1)  doxycycline 100 mg two times a day 2)  call next week if not improving, or sooner if worse. Prescriptions: DOXYCYCLINE HYCLATE 100 MG TABS (DOXYCYCLINE HYCLATE) 1 tab two times a day  #14 x 0   Entered and Authorized by:   Minus Breeding MD   Signed by:   Minus Breeding MD on 03/09/2010   Method used:   Electronically to        [facility]  [address] [phone]* (retail)       [address]       [place], [place]  [id number]       Ph: [phone] or [phone]       Fax: [phone]   RxID:   [id number]   Flu Vaccine Consent Questions     Do you have a history of severe allergic reactions to this vaccine? no    Any prior  history of allergic reactions to egg and/or gelatin? no    Do you have a sensitivity to the preservative Thimersol? no    Do you have a past history of Guillan-Barre Syndrome? no    Do you currently have an acute febrile illness? no    Have you ever had a severe reaction to latex? no    Vaccine information given and explained to patient? yes    Are you currently pregnant? no    Lot Number:[id number]   Exp Date:12/18/2010   Site Given RIGHT Deltoid IMlbflu ......... Lamar Sprinkles, CMA  March 09, 2010 5:30 PM

## 2010-07-20 NOTE — Letter (Signed)
Summary: Out of Work  [facility]  [address] [place], [place] [address]   Phone: [phone]  Fax: [phone]    October 16, 2008   Employee:  [name], Montez Hageman    To Whom It May Concern:   For Medical reasons, please excuse the above named employee from work for the following dates:  Start:   October 16, 2008  End:   Oct 20, 2008  If you need additional information, please feel free to contact our office.         Sincerely,    Oliver Barre, MD

## 2010-07-20 NOTE — Miscellaneous (Signed)
  Clinical Lists Changes  Medications: Changed medication from COREG 25 MG  TABS (CARVEDILOL) take 2 by mouth two times a day qd to COREG 25 MG  TABS (CARVEDILOL) 2 tabs by mouth two times a day

## 2010-07-20 NOTE — Assessment & Plan Note (Signed)
Summary: CHEST CONGESTION/ SINUS /NWS   Vital Signs:  Patient profile:   35 year old male Height:      69 inches Weight:      377.50 pounds BMI:     55.95 O2 Sat:      95 % on Room air Temp:     97.6 degrees F oral Pulse rate:   75 / minute BP sitting:   134 / 84  (left arm) Cuff size:   large  Vitals Entered By: Zella Ball Ewing CMA Duncan Dull) (January 07, 2010 1:45 PM)  O2 Flow:  Room air CC: Chest and nasal congestion, cough/RE   Primary Care Provider:  [name] MD  CC:  Chest and nasal congestion and cough/RE.  History of Present Illness: here with 3 days acute onset mild to mod facial pain, pressure, fever , then one day ST and now prod cough with greenish sputum, but Pt denies CP, sob, doe, wheezing, orthopnea, pnd, worsening LE edema, palps, dizziness or syncope .  Pt denies new neuro symptoms such as headache, facial or extremity weakness  Overall good  complaince with meds, good tolerance.    Problems Prior to Update: 1)  Gastroenteritis  (ICD-558.9) 2)  Allergic Rhinitis  (ICD-477.9) 3)  Cardiomyopathy  (ICD-425.4) 4)  Coronary Artery Disease  (ICD-414.00) 5)  Congestive Heart Failure  (ICD-428.0) 6)  Hypertension  (ICD-401.9) 7)  Hyperlipidemia  (ICD-272.4) 8)  Obesity  (ICD-278.00) 9)  Achilles Tendinitis, Bilateral  (ICD-726.71) 10)  Preventive Health Care  (ICD-V70.0) 11)  Sleep Apnea  (ICD-780.57)  Medications Prior to Update: 1)  Adult Aspirin Low Strength 81 Mg  Tbdp (Aspirin) .... Take 1 By Mouth Qd 2)  Diovan 160 Mg  Tabs (Valsartan) .... Take 1 By Mouth Two Times A Day Qd 3)  Furosemide 20 Mg  Tabs (Furosemide) .... 2 Tabs By Mouth Once Daily 4)  Norvasc 5 Mg  Tabs (Amlodipine Besylate) .... Take 1 By Mouth Qd 5)  Coreg 25 Mg  Tabs (Carvedilol) .... 2 Tabs By Mouth Two Times A Day 6)  Elocon 0.1 %  Crea (Mometasone Furoate) .... Use Asd Two Times A Day As Needed  Current Medications (verified): 1)  Adult Aspirin Low Strength 81 Mg  Tbdp (Aspirin) ....  Take 1 By Mouth Qd 2)  Diovan 160 Mg  Tabs (Valsartan) .... Take 1 By Mouth Two Times A Day Qd 3)  Furosemide 20 Mg  Tabs (Furosemide) .... 2 Tabs By Mouth Once Daily 4)  Norvasc 5 Mg  Tabs (Amlodipine Besylate) .... Take 1 By Mouth Qd 5)  Coreg 25 Mg  Tabs (Carvedilol) .... 2 Tabs By Mouth Two Times A Day 6)  Elocon 0.1 %  Crea (Mometasone Furoate) .... Use Asd Two Times A Day As Needed 7)  Azithromycin 250 Mg Tabs (Azithromycin) .... 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  , mild ill  Head:  normocephalic and atraumatic.   Eyes:  vision grossly intact, pupils equal, and pupils round.   Ears:  bilat tm's mild red, sinus tender bilat Nose:  nasal dischargemucosal pallor and mucosal edema.   Mouth:  pharyngeal erythema and fair dentition.   Neck:  supple and cervical lymphadenopathy.   Lungs:  normal respiratory effort and normal breath sounds.   Heart:  normal rate and regular rhythm.   Extremities:  no edema, no erythema    Impression & Recommendations:  Problem # 1:  SINUSITIS- ACUTE-NOS (ICD-461.9)  His updated medication list for this problem includes:    Azithromycin 250 Mg Tabs (Azithromycin) .Marland Kitchen... 2po qd for 1 day, then 1po qd for 4days, then stop treat as above, f/u  any worsening signs or symptoms   Problem # 2:  BRONCHITIS-ACUTE (ICD-466.0)  His updated medication list for this problem includes:    Azithromycin 250 Mg Tabs (Azithromycin) .Marland Kitchen... 2po qd for 1 day, then 1po qd for 4days, then stop treat as above, f/u any worsening signs or symptoms  - as above  Problem # 3:  HYPERTENSION (ICD-401.9)  His updated medication list for this problem includes:    Diovan 160 Mg Tabs (Valsartan) .Marland Kitchen... Take 1 by mouth two times a day qd    Furosemide 20 Mg Tabs (Furosemide) .Marland Kitchen... 2 tabs by mouth once daily    Norvasc 5 Mg Tabs (Amlodipine besylate) .Marland Kitchen... Take 1 by mouth qd    Coreg 25 Mg Tabs (Carvedilol) .Marland Kitchen... 2 tabs by mouth two times a day  BP today: 134/84 Prior BP: 118/82 (08/13/2009)  Prior 10 Yr Risk Heart Disease: N/A (08/13/2009)  Labs Reviewed: K+: 3.8 (08/13/2009) Creat: : 0.9 (08/13/2009)   Chol: 228 (10/16/2008)   HDL: 28.80 (10/16/2008)   LDL: 133 (05/30/2007)   TG: 178.0 (10/16/2008) stable overall by hx and exam, ok to continue meds/tx as is   Problem # 4:  CONGESTIVE HEART FAILURE (ICD-428.0)  His updated medication list for this problem includes:    Adult Aspirin Low Strength 81 Mg Tbdp (Aspirin) .Marland Kitchen... Take 1 by mouth qd    Diovan 160 Mg Tabs (Valsartan) .Marland Kitchen... Take 1 by mouth two times a day qd    Furosemide 20 Mg Tabs (Furosemide) .Marland Kitchen... 2 tabs by mouth once daily    Coreg 25 Mg Tabs (Carvedilol) .Marland Kitchen... 2 tabs by mouth two times a day  stable overall by hx and exam, ok to continue meds/tx as is   Complete Medication List: 1)  Adult Aspirin Low Strength 81 Mg Tbdp (Aspirin) .... Take 1 by mouth qd 2)  Diovan 160 Mg Tabs (Valsartan) .... Take 1 by mouth two times a day qd 3)  Furosemide 20 Mg Tabs (Furosemide) .... 2 tabs by mouth once daily 4)  Norvasc 5 Mg Tabs (Amlodipine besylate) .... Take 1 by mouth qd 5)  Coreg 25 Mg Tabs (Carvedilol) .... 2 tabs by mouth two times a day 6)  Elocon 0.1 % Crea (Mometasone furoate) .... Use asd  two times a day as needed 7)  Azithromycin 250 Mg Tabs (Azithromycin) .... 2po qd for 1 day, then 1po qd for 4days, then stop  Patient Instructions: 1)  Please take all new medications as prescribed 2)  Continue all previous medications as before this visit , including the tussionex you have already at home 3)  you are given the work note today 4)  Please schedule a follow-up appointment as needed.  Prescriptions: AZITHROMYCIN 250 MG TABS (AZITHROMYCIN) 2po qd for 1 day, then 1po qd for 4days, then stop  #6 x 1   Entered and Authorized by:   [name] MD   Signed by:   [name] MD on 01/07/2010   Method used:   Print then Give to Patient   RxID:   [id number]

## 2010-07-20 NOTE — Assessment & Plan Note (Signed)
Summary: COLD//VGJ   Vital Signs:  Patient profile:   35 year old male Weight:      371 pounds O2 Sat:      93 % on Room air Temp:     98.1 degrees F oral Pulse rate:   71 / minute BP sitting:   122 / 74  (left arm) Cuff size:   large  Vitals Entered By: Alfred Levins, CMA (May 09, 2009 11:41 AM)  O2 Flow:  Room air CC: cough, congestion, achy, sob   History of Present Illness: Here for 3 days of aches, HA, ST, SOB, and coughing up yellow sputum. No fever.   Allergies: 1)  Ace Inhibitors  Past History:  Past Medical History: Reviewed history from 04/21/2009 and no changes required. Current Problems:  CARDIOMYOPATHY (ICD-425.4) CORONARY ARTERY DISEASE (ICD-414.00) CONGESTIVE HEART FAILURE (ICD-428.0) HYPERTENSION (ICD-401.9) HYPERLIPIDEMIA (ICD-272.4) SINUSITIS (ICD-473.9) OBESITY (ICD-278.00) ANKLE PAIN, LEFT (ICD-719.47) ACHILLES TENDINITIS, BILATERAL (ICD-726.71) PREVENTIVE HEALTH CARE (ICD-V70.0) RASH-NONVESICULAR (ICD-782.1) SLEEP APNEA (ICD-780.57) Cough due to Ace Inhibitors restrictive lung disease hx of pneumonia 2004 Coronary artery disease - minor  by cath 2006  Past Surgical History: Reviewed history from 12/04/2007 and no changes required. Denies surgical history  Review of Systems  The patient denies anorexia, fever, weight loss, weight gain, vision loss, decreased hearing, hoarseness, chest pain, syncope, peripheral edema, headaches, hemoptysis, abdominal pain, melena, hematochezia, severe indigestion/heartburn, hematuria, incontinence, genital sores, muscle weakness, suspicious skin lesions, transient blindness, difficulty walking, depression, unusual weight change, abnormal bleeding, enlarged lymph nodes, angioedema, breast masses, and testicular masses.    Physical Exam  General:  overweight-appearing.  appears ill Head:  Normocephalic and atraumatic without obvious abnormalities. No apparent alopecia or balding. Eyes:  No corneal or  conjunctival inflammation noted. EOMI. Perrla. Funduscopic exam benign, without hemorrhages, exudates or papilledema. Vision grossly normal. Ears:  External ear exam shows no significant lesions or deformities.  Otoscopic examination reveals clear canals, tympanic membranes are intact bilaterally without bulging, retraction, inflammation or discharge. Hearing is grossly normal bilaterally. Nose:  External nasal examination shows no deformity or inflammation. Nasal mucosa are pink and moist without lesions or exudates. Mouth:  Oral mucosa and oropharynx without lesions or exudates.  Teeth in good repair. Neck:  No deformities, masses, or tenderness noted. Lungs:  scattered rhonchi, no rales Heart:  Normal rate and regular rhythm. S1 and S2 normal without gallop, murmur, click, rub or other extra sounds.   Impression & Recommendations:  Problem # 1:  ACUTE BRONCHITIS (ICD-466.0)  His updated medication list for this problem includes:    Zithromax Z-pak 250 Mg Tabs (Azithromycin) .Marland Kitchen... As directed    Tussionex Pennkinetic Er 8-10 Mg/25ml Lqcr (Chlorpheniramine-hydrocodone) .Marland Kitchen... 1 tsp two times a day as needed cough  Orders: Nebulizer Tx ([address])  Complete Medication List: 1)  Adult Aspirin Low Strength 81 Mg Tbdp (Aspirin) .... Take 1 by mouth qd 2)  Diovan 160 Mg Tabs (Valsartan) .... Take 1 by mouth two times a day qd 3)  Furosemide 20 Mg Tabs (Furosemide) .... Take 1 by mouth qd 4)  Norvasc 5 Mg Tabs (Amlodipine besylate) .... Take 1 by mouth qd 5)  Coreg 25 Mg Tabs (Carvedilol) .... Take 2 by mouth two times a day qd 6)  Elocon 0.1 % Crea (Mometasone furoate) .... Use asd two times a day as needed 7)  Meloxicam 15 Mg Tabs (Meloxicam) .Marland Kitchen.. 1 by mouth once daily as needed pain 8)  Simvastatin 40 Mg Tabs (Simvastatin) .Marland Kitchen.. 1po once daily  9)  Zithromax Z-pak 250 Mg Tabs (Azithromycin) .... As directed 10)  Tussionex Pennkinetic Er 8-10 Mg/81ml Lqcr (Chlorpheniramine-hydrocodone) .Marland Kitchen.. 1 tsp  two times a day as needed cough  Patient Instructions: 1)  Please schedule a follow-up appointment as needed .  Prescriptions: Sandria Senter ER 8-10 MG/5ML LQCR (CHLORPHENIRAMINE-HYDROCODONE) 1 tsp two times a day as needed cough  #200 x 0   Entered and Authorized by:   Nelwyn Salisbury MD   Signed by:   Nelwyn Salisbury MD on 05/09/2009   Method used:   Print then Give to Patient   RxID:   [id number] ZITHROMAX Z-PAK 250 MG TABS (AZITHROMYCIN) as directed  #1 x 0   Entered and Authorized by:   Nelwyn Salisbury MD   Signed by:   Nelwyn Salisbury MD on 05/09/2009   Method used:   Print then Give to Patient   RxID:   [id number]

## 2010-07-20 NOTE — Assessment & Plan Note (Signed)
Summary: BIL HEEL PAIN/$50/JSS-PER PT RS EARLIER DY-HEEL PAIN-STC   Vital Signs:  Patient profile:   35 year old male Height:      69 inches (175.26 cm) Weight:      369.12 pounds (167.78 kg) O2 Sat:      97 % Temp:     98.2 degrees F (36.78 degrees C) oral Pulse rate:   65 / minute BP sitting:   132 / 90  (left arm) Cuff size:   large  Vitals Entered By: Orlan Leavens (January 06, 2009 10:58 AM) CC: Ongoing heel pain. Pt states was rx meloxicam by [facility] med not helping/ also complaining of irritation of (L) eye Is Patient Diabetic? No Pain Assessment Patient in pain? yes     Location: heel Type: aching   Primary Care Provider:  [name] MD  CC:  Ongoing heel pain. Pt states was rx meloxicam by [facility] med not helping/ also complaining of irritation of (L) eye.  History of Present Illness: here for evaluation of R>L heel and ankle pain onset symptoms 4/10 and has been on Mobic 15 mg daily since that time Symptoms have never been relieved in the past 2 months. Reports he was playing tennis last night and was experiencing pain again. This morning felt right ankle is swollen and very difficult to walk due to pain in ankle, right side does not recall feeling or hearing any pop Also, has pain in midfoot  c/o L eye burning since this AM -  no redness no tearing no allergies ?something in it? wants it checked  Current Medications (verified): 1)  Adult Aspirin Low Strength 81 Mg  Tbdp (Aspirin) .... Take 1 By Mouth Qd 2)  Diovan 160 Mg  Tabs (Valsartan) .... Take 1 By Mouth Two Times A Day Qd 3)  Furosemide 20 Mg  Tabs (Furosemide) .... Take 1 By Mouth Qd 4)  Norvasc 5 Mg  Tabs (Amlodipine Besylate) .... Take 1 By Mouth Qd 5)  Coreg 25 Mg  Tabs (Carvedilol) .... Take 2 By Mouth Two Times A Day Qd 6)  Elocon 0.1 %  Crea (Mometasone Furoate) .... Use Asd Two Times A Day As Needed 7)  Meloxicam 15 Mg Tabs (Meloxicam) .Marland Kitchen.. 1 By Mouth Once Daily As Needed Pain 8)  Simvastatin 40 Mg  Tabs (Simvastatin) .Marland Kitchen.. 1po Once Daily  Allergies (verified): 1)  Ace Inhibitors  Past History:  Past Medical History: Reviewed history from 12/04/2007 and no changes required. Hyperlipidemia Hypertension Nonischemic cardiomyopathy Cough due to Ace Inhibitors Congestive heart failure restrictive lung disease hx of pneumonia 2004 Coronary artery disease - minor  by cath 2006  Review of Systems  The patient denies fever, weight loss, chest pain, syncope, and peripheral edema.    Physical Exam  General:  overweight-appearing.  alert, well-developed, well-nourished, and cooperative to examination.    Eyes:  vision grossly intact; pupils equal, round and reactive to light.  conjunctiva and lids normal.   no foreign body noted in left eye Msk:  pain to palpation at right Achilles tendon attachment. No appreciable divet or irregularity to palpation. Achilles function intact bilaterally. R foot with inward pronation while standing as compared to left. 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 Take 1 by mouth qd 2)  Diovan 160 Mg Tabs (Valsartan) .... Take 1 by mouth two times a day qd 3)  Furosemide 20 Mg Tabs (Furosemide) .... Take 1 by mouth qd 4)  Norvasc 5 Mg Tabs (Amlodipine besylate) .... Take 1 by mouth qd 5)  Coreg 25 Mg Tabs (Carvedilol) .... Take 2 by mouth two  times a day qd 6)  Elocon 0.1 % Crea (Mometasone furoate) .... Use asd two times a day as needed 7)  Meloxicam 15 Mg Tabs (Meloxicam) .Marland Kitchen.. 1 by mouth once daily as needed pain 8)  Simvastatin 40 Mg Tabs (Simvastatin) .Marland Kitchen.. 1po once daily  Patient Instructions: 1)  you have an appointment today at [facility] with sports medicine specialist Dr. Margaretha Sheffield at 3:00pm. call [phone] if questions or directions needed.

## 2010-07-20 NOTE — Progress Notes (Signed)
Summary: [facility] [employer] request for records  [facility] Office request for records. Request forwarded to [employer]. Dena Chavis  June 22, 2009 12:07 PM

## 2010-07-20 NOTE — Progress Notes (Signed)
Summary: refill  Phone Note Refill Request   Refills Requested: Medication #1:  COREG 25 MG  TABS take 2 by mouth two times a day qd   Supply Requested: 3 months [facility] on [address]   Method Requested: Fax to Local Pharmacy Initial call taken by: Migdalia Dk,  June 24, 2009 9:42 AM    Prescriptions: COREG 25 MG  TABS (CARVEDILOL) take 2 by mouth two times a day qd  #120 x 3   Entered by:   Kem Parkinson   Authorized by:   Ferman Hamming, MD, [facility]   Signed by:   Kem Parkinson on 06/24/2009   Method used:   Electronically to        [facility] (retail)       [address]       [place], [place]  [address]       Ph: [phone]       Fax: [phone]   RxID:   [id number]

## 2010-07-20 NOTE — Progress Notes (Signed)
Summary: pt needs meds today  Phone Note Refill Request Call back at Home Phone [phone] Message from:  Patient on [facility] on [address] [phone]  Refills Requested: Medication #1:  NORVASC 10 MG TABS pt is completely out pls call today  Initial call taken by: Omer Jack,  December 16, 2008 2:21 PM Caller: Patient  Follow-up for Phone Call        pt was in hospital in march 2010 and pt norvsc was 5 mg two times a day so i refilled it to 10mg  1 once daily told pharmacy and called pt Follow-up by: Kem Parkinson,  December 16, 2008 2:47 PM

## 2010-07-20 NOTE — Assessment & Plan Note (Signed)
Summary: YEARLY/SL/ appt is 3:45/ gd   Primary Bradi Arbuthnot:  [name] MD  CC:  pt complains of heart burn and sob.  History of Present Illness: [name] is a pleasant gentleman who has a history of hypertension and nonischemic cardiomyopathy.  However, his most recent MUGA performed on July 29, 2006, showed that his ejection fraction had improved to 60%. I last saw him in November of 2009. We repeated his echocardiogram at that time and indeed his LV function was normal. There was mild left ventricular hypertrophy and mild left atrial enlargement. Since I last saw him he denies any dyspnea on exertion, orthopnea, PND,  palpitations, syncope or chest pain. He has noticed mild pedal edema worse towards the end of the day.  Current Medications (verified): 1)  Adult Aspirin Low Strength 81 Mg  Tbdp (Aspirin) .... Take 1 By Mouth Qd 2)  Diovan 160 Mg  Tabs (Valsartan) .... Take 1 By Mouth Two Times A Day Qd 3)  Furosemide 20 Mg  Tabs (Furosemide) .... 2 Tabs By Mouth Once Daily 4)  Norvasc 5 Mg  Tabs (Amlodipine Besylate) .... Take 1 By Mouth Qd 5)  Coreg 25 Mg  Tabs (Carvedilol) .... Take 2 By Mouth Two Times A Day Qd 6)  Elocon 0.1 %  Crea (Mometasone Furoate) .... Use Asd Two Times A Day As Needed 7)  Tussionex Pennkinetic Er 8-10 Mg/72ml Lqcr (Chlorpheniramine-Hydrocodone) .Marland Kitchen.. 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 Remaining systems are negative.   Vital Signs:  Patient profile:   35 year old male Height:      69 inches Weight:      376 pounds BMI:     55.73 Pulse rate:   71 / minute Resp:     12 per minute BP sitting:   124 / 80  (left arm)  Vitals Entered By: Kem Parkinson (May 11, 2009 4:09 PM)  Physical Exam  General:  well-developed morbidly obese in no acute distress. Skin is warm and dry. Head:  HEENT is normal. Neck:  supple no bruits. No thyromegaly. Lungs:  clear to auscultation Heart:  regular rate and rhythm Abdomen:  soft and nontender. No masses palpated. Extremities:  trace edema. Neurologic:  grossly intact.   EKG  Procedure date:  05/11/2009  Findings:      Sinus rhythm at a rate of 71. Axis normal. No ST changes.  Impression & Recommendations:  Problem # 1:  CARDIOMYOPATHY (ICD-425.4) His LV function has improved on most recent echocardiogram. Continue beta blocker and ARB as well as diuretic. Check bmet. His updated medication list for this problem includes:    Adult Aspirin Low Strength 81 Mg Tbdp (Aspirin) .Marland Kitchen... Take 1 by mouth qd    Diovan 160 Mg Tabs (Valsartan) .Marland Kitchen... Take 1 by mouth two times a day qd    Furosemide 20 Mg Tabs (Furosemide) .Marland Kitchen... 2 tabs by mouth once daily    Norvasc 5 Mg  Tabs (Amlodipine besylate) .Marland Kitchen... Take 1 by mouth qd    Coreg 25 Mg Tabs (Carvedilol) .Marland Kitchen... Take 2 by mouth two times a day qd  Problem # 2:  HYPERTENSION (ICD-401.9) His blood pressure is controlled on present medications. Will continue. His updated medication list for this problem includes:    Adult Aspirin Low Strength 81 Mg Tbdp (Aspirin) .Marland Kitchen... Take 1 by mouth qd    Diovan 160 Mg Tabs (Valsartan) .Marland Kitchen... Take 1 by mouth two times a day qd    Furosemide 20 Mg Tabs (Furosemide) .Marland Kitchen... 2 tabs by mouth once daily    Norvasc 5 Mg Tabs (Amlodipine besylate) .Marland Kitchen... Take 1 by mouth qd    Coreg 25  Mg Tabs (Carvedilol) .Marland Kitchen... Take 2 by mouth two times a day qd  Orders: TLB-BMP (Basic Metabolic Panel-BMET) (80048-METABOL)  Problem # 3:  HYPERLIPIDEMIA (ICD-272.4) Continue diet. Management per his primary care. The following medications were removed from the medication list:    Simvastatin 40 Mg Tabs (Simvastatin) .Marland Kitchen... 1po once daily  Problem # 4:  OBESITY (ICD-278.00) Discussed the importance of weight loss.  Problem # 5:  SLEEP APNEA (ICD-780.57) Management per  primary care.  Patient Instructions: 1)  Your physician recommends that you schedule a follow-up appointment in: ONE YEAR

## 2010-07-20 NOTE — Letter (Signed)
Summary: Out of Work  [facility]  [address] [place], [place] [address]   Phone: [phone]  Fax: [phone]    March 09, 2010   Employee:  [name], Montez Hageman    To Whom It May Concern:   For Medical reasons, please excuse the above named employee from work for the following dates:  Start:   03/09/10  End:   03/11/10    Sincerely,    Minus Breeding MD

## 2010-07-20 NOTE — Assessment & Plan Note (Signed)
Summary: BANGED UP L ANKLE/ SWELLING /NWS #   Vital Signs:  Patient profile:   35 year old male Height:      69 inches (175.26 cm) Weight:      379.50 pounds (172.50 kg) O2 Sat:      95 % on Room air Temp:     97.3 degrees F (36.28 degrees C) oral Pulse rate:   60 / minute BP sitting:   138 / 88  (left arm) Cuff size:   large  Vitals Entered By: Josph Macho CMA (April 01, 2009 3:03 PM)  O2 Flow:  Room air CC: Left ankle swollen X1 month/ pt states he wants flu vax/ CF   Primary Care Provider:  [name] MD  CC:  Left ankle swollen X1 month/ pt states he wants flu vax/ CF.  History of Present Illness: here 4 wks after unfortnately slipped a bit with walking down a few stairs where he missed a step such that the medial left ankle area scraped the side of the wooden stair to the next step with all his wt behind it, causing immed severe pain and sweling and bruising;  he has tried to tx  conservatively with OTC meds, elevation, ice and heat intermittently, but pain, swelling and bruising actually persist even the 4 wks; still limps to walk somewhat though not as bad; also with known achilles bone spur per last visit with dr Felicity Coyer but this is not involved this time;  no further falls, trauma, hx of gout, fever, or other joint involvment.  Pain overall now 5/10.  Pt denies CP, sob, doe, wheezing, orthopnea, pnd, worsening LE edema, palps, dizziness or syncope   Pt denies new neuro symptoms such as headache, facial or extremity weakness   Problems Prior to Update: 1)  Ankle Pain, Left  (ICD-719.47) 2)  Achilles Tendinitis, Bilateral  (ICD-726.71) 3)  Preventive Health Care  (ICD-V70.0) 4)  Rash-nonvesicular  (ICD-782.1) 5)  Coronary Artery Disease  (ICD-414.00) 6)  Congestive Heart Failure  (ICD-428.0) 7)  Sleep Apnea  (ICD-780.57) 8)  Hypertension  (ICD-401.9) 9)  Hyperlipidemia  (ICD-272.4)  Medications Prior to Update: 1)  Adult Aspirin Low Strength 81 Mg  Tbdp  (Aspirin) .... Take 1 By Mouth Qd 2)  Diovan 160 Mg  Tabs (Valsartan) .... Take 1 By Mouth Two Times A Day Qd 3)  Furosemide 20 Mg  Tabs (Furosemide) .... Take 1 By Mouth Qd 4)  Norvasc 5 Mg  Tabs (Amlodipine Besylate) .... Take 1 By Mouth Qd 5)  Coreg 25 Mg  Tabs (Carvedilol) .... Take 2 By Mouth Two Times A Day Qd 6)  Elocon 0.1 %  Crea (Mometasone Furoate) .... Use Asd Two Times A Day As Needed 7)  Meloxicam 15 Mg Tabs (Meloxicam) .Marland Kitchen.. 1 By Mouth Once Daily As Needed Pain 8)  Simvastatin 40 Mg Tabs (Simvastatin) .Marland Kitchen.. 1po Once Daily  Current Medications (verified): 1)  Adult Aspirin Low Strength 81 Mg  Tbdp (Aspirin) .... Take 1 By Mouth Qd 2)  Diovan 160 Mg  Tabs (Valsartan) .... Take 1 By Mouth Two Times A Day Qd 3)  Furosemide 20 Mg  Tabs (Furosemide) .... Take 1 By Mouth Qd 4)  Norvasc 5 Mg  Tabs (Amlodipine Besylate) .... Take 1 By Mouth Qd 5)  Coreg 25 Mg  Tabs (Carvedilol) .... Take 2 By Mouth Two Times A Day Qd 6)  Elocon 0.1 %  Crea (Mometasone Furoate) .... Use Asd Two Times A Day As  Needed 7)  Meloxicam 15 Mg Tabs (Meloxicam) .Marland Kitchen.. 1 By Mouth Once Daily As Needed Pain 8)  Simvastatin 40 Mg Tabs (Simvastatin) .Marland Kitchen.. 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   Head:  normocephalic and atraumatic.   Eyes:  vision grossly intact, pupils equal,  and pupils round.   Ears:  R ear normal and L ear normal.   Nose:  no external deformity and no nasal discharge.   Mouth:  no gingival abnormalities and pharynx pink and moist.   Neck:  supple and no masses.   Lungs:  normal respiratory effort and normal breath sounds.   Heart:  normal rate and regular rhythm.   Msk:  left medial ankle and distal leg wtih 1-2+ swelling, tender and bruise medial aspect only; ankle with FROM Extremities:  some left foot puffiness, but o/w no edema, no erythema  Neurologic:  left foot and leg o/w neuro intact   Impression & Recommendations:  Problem # 1:  ANKLE PAIN, LEFT (ICD-719.47) ? fx - for film today, consider ortho Orders: T-Ankle Comp Left Min 3 Views (73610TC)  Problem # 2:  HYPERTENSION (ICD-401.9)  His updated medication list for this problem includes:    Diovan 160 Mg Tabs (Valsartan) .Marland Kitchen... Take 1 by mouth two times a day qd    Furosemide 20 Mg Tabs (Furosemide) .Marland Kitchen... Take 1 by mouth qd    Norvasc 5 Mg Tabs (Amlodipine besylate) .Marland Kitchen... Take 1 by mouth qd    Coreg 25 Mg Tabs (Carvedilol) .Marland Kitchen... Take 2 by mouth two times a day once daily  BP today: 138/88 Prior BP: 132/90 (01/06/2009)  Labs Reviewed: K+: 3.7 (10/16/2008) Creat: : 0.9 (10/16/2008)   Chol: 228 (10/16/2008)   HDL: 28.80 (10/16/2008)   LDL: 133 (05/30/2007)   TG: 178.0 (10/16/2008) stable overall by hx and exam, ok to continue meds/tx as is   Problem # 3:  CONGESTIVE HEART FAILURE (ICD-428.0)  His updated medication list for this problem includes:    Adult Aspirin Low Strength 81 Mg Tbdp (Aspirin) .Marland Kitchen... Take 1 by mouth qd    Diovan 160 Mg Tabs (Valsartan) .Marland Kitchen... Take 1 by mouth two times a day qd    Furosemide 20 Mg Tabs (Furosemide) .Marland Kitchen... Take 1 by mouth qd    Coreg 25 Mg Tabs (Carvedilol) .Marland Kitchen... Take 2 by mouth two times a day qd stable overall by hx and exam, ok to continue meds/tx as is   Problem # 4:  HYPERLIPIDEMIA (ICD-272.4)  His updated medication list for this  problem includes:    Simvastatin 40 Mg Tabs (Simvastatin) .Marland Kitchen... 1po once daily  Labs Reviewed: SGOT: 25 (10/16/2008)   SGPT: 32 (10/16/2008)   HDL:28.80 (10/16/2008), 33.9 (05/30/2007)  LDL:133 (05/30/2007)  Chol:228 (10/16/2008), 178 (05/30/2007)  Trig:178.0 (10/16/2008), 58 (05/30/2007) d/w pt - has not yet started the statin - urged to do so  Complete Medication List: 1)  Adult Aspirin Low  Strength 81 Mg Tbdp (Aspirin) .... Take 1 by mouth qd 2)  Diovan 160 Mg Tabs (Valsartan) .... Take 1 by mouth two times a day qd 3)  Furosemide 20 Mg Tabs (Furosemide) .... Take 1 by mouth qd 4)  Norvasc 5 Mg Tabs (Amlodipine besylate) .... Take 1 by mouth qd 5)  Coreg 25 Mg Tabs (Carvedilol) .... Take 2 by mouth two times a day qd 6)  Elocon 0.1 % Crea (Mometasone furoate) .... Use asd two times a day as needed 7)  Meloxicam 15 Mg Tabs (Meloxicam) .Marland Kitchen.. 1 by mouth once daily as needed pain 8)  Simvastatin 40 Mg Tabs (Simvastatin) .Marland Kitchen.. 1po once daily  Other Orders: Admin 1st Vaccine (66063) Flu Vaccine 12yrs + (01601) Flu Vaccine Consent Questions     Do you have a history of severe allergic reactions to this vaccine? no    Any prior history of allergic reactions to egg and/or gelatin? no    Do you have a sensitivity to the preservative Thimersol? no    Do you have a past history of Guillan-Barre Syndrome? no    Do you currently have an acute febrile illness? no    Have you ever had a severe reaction to latex? no    Vaccine information given and explained to patient? yes    Are you currently pregnant? no    Lot Number:[id number]   Exp Date:12/17/2009   Site Given  Left Deltoid IM Flu Vaccine 36yrs + (09323)  Patient Instructions: 1)  you had the flu shot today 2)  please start the simvastatin 40 mg per day 3)  Please go to Radiology in the basement level for your X-Ray today  4)  Continue all previous medications as before this visit  5)  please followup in April  2011 Prescriptions: SIMVASTATIN 40 MG TABS (SIMVASTATIN) 1po once daily  #90 x 3   Entered and Authorized by:   [name] MD   Signed by:   [name] MD on 04/01/2009   Method used:   Print then Give to Patient   RxID:   [id number]  .lbflu

## 2010-07-20 NOTE — Consult Note (Signed)
Summary: [facility]  [facility]   Imported By: Lester Harrah 01/14/2009 07:57:56  _____________________________________________________________________  External Attachment:    Type:   Image     Comment:   External Document

## 2010-07-20 NOTE — Assessment & Plan Note (Signed)
Summary: RASH/ $50 /NWS   Vital Signs:  Patient Profile:   35 Years Old Male Weight:      341 pounds Pulse rate:   60 / minute Pulse rhythm:   regular BP sitting:   179 / 112  (right arm) Cuff size:   large  Pt. in pain?   no  Vitals Entered By: Rock Nephew CMA (December 04, 2007 9:31 AM)                  History of Present Illness: admits to not taking the diovan and coreg second of the day doses on regular basis    Updated Prior Medication List: ADULT ASPIRIN LOW STRENGTH 81 MG  TBDP (ASPIRIN) take 1 by mouth qd DIOVAN 160 MG  TABS (VALSARTAN) take 1 by mouth two times a day qd FUROSEMIDE 20 MG  TABS (FUROSEMIDE) take 1 by mouth qd NORVASC 5 MG  TABS (AMLODIPINE BESYLATE) take 1 by mouth qd COREG 25 MG  TABS (CARVEDILOL) take 2 by mouth two times a day qd ELOCON 0.1 %  CREA (MOMETASONE FUROATE) use asd two times a day as needed  Current Allergies (reviewed today): ACE INHIBITORS  Past Medical History:    Reviewed history from 03/12/2007 and no changes required:       Hyperlipidemia       Hypertension       Nonischemic cardiomyopathy       Cough due to Ace Inhibitors       Congestive heart failure       restrictive lung disease       hx of pneumonia 2004       Coronary artery disease - minor  by cath 2006  Past Surgical History:    Reviewed history and no changes required:       Denies surgical history   Family History:    Reviewed history and no changes required:       brother with asthma       both parents with HTN, CAD/MI  Social History:    Reviewed history and no changes required:       city bus driver       Married       2 children       Never Smoked       Alcohol use-no   Risk Factors:  Tobacco use:  never Alcohol use:  no   Review of Systems       all otherwise negative    Physical Exam  General:     Well-developed,well-nourished,in no acute distress; alert,appropriate and cooperative throughout examination Head:  Normocephalic and atraumatic without obvious abnormalities. No apparent alopecia or balding. Eyes:     No corneal or conjunctival inflammation noted. EOMI. Perrla. Ears:     External ear exam shows no significant lesions or deformities.  Otoscopic examination reveals clear canals, tympanic membranes are intact bilaterally without bulging, retraction, inflammation or discharge. Hearing is grossly normal bilaterally. Nose:     External nasal examination shows no deformity or inflammation. Nasal mucosa are pink and moist without lesions or exudates. Mouth:     Oral mucosa and oropharynx without lesions or exudates.  Teeth in good repair. Neck:     No deformities, masses, or tenderness noted. Lungs:     Normal respiratory effort, chest expands symmetrically. Lungs are clear to auscultation, no crackles or wheezes. Heart:     Normal rate and regular rhythm. S1 and S2 normal without gallop, murmur,  click, rub or other extra sounds. Extremities:     no edema, no ulcers  Skin:     3 small areas approx 1 cm to left elbow, right mid abd and right distal ant thigh similar with scaly erythem rash, nontender    Impression & Recommendations:  Problem # 1:  RASH-NONVESICULAR (ICD-782.1) c/w eczema - tx with elocon cream prn His updated medication list for this problem includes:    Elocon 0.1 % Crea (Mometasone furoate) ..... Use asd two times a day as needed   Problem # 2:  HYPERTENSION (ICD-401.9)  His updated medication list for this problem includes:    Diovan 160 Mg Tabs (Valsartan) .Marland Kitchen... Take 1 by mouth two times a day qd    Furosemide 20 Mg Tabs (Furosemide) .Marland Kitchen... Take 1 by mouth qd    Norvasc 5 Mg Tabs (Amlodipine besylate) .Marland Kitchen... Take 1 by mouth qd    Coreg 25 Mg Tabs (Carvedilol) .Marland Kitchen... Take 2 by mouth two times a day qd  BP today: 179/112 Prior BP: 105/62 (08/09/2006)  Labs Reviewed: Creat: 1.0 (05/30/2007) Chol: 178 (05/30/2007)   HDL: 33.9 (05/30/2007)   LDL: 133 (05/30/2007)    TG: 58 (05/30/2007) d/w pt - urged compliance with meds; advised on the option to change to coreg CR to get once dialy dosing option, or change of the diovan and norvasc to azor to do the same, but will cost more money and he will d/w his cardioloigist - crenshaw  Problem # 3:  HYPERLIPIDEMIA (ICD-272.4)  Labs Reviewed: Chol: 178 (05/30/2007)   HDL: 33.9 (05/30/2007)   LDL: 133 (05/30/2007)   TG: 58 (05/30/2007) SGOT: 26 (05/30/2007)   SGPT: 38 (05/30/2007)  also d/w pt importance of cont'd diet and meds - he declines any changes and will cont to work on his diet, wt loss  Complete Medication List: 1)  Adult Aspirin Low Strength 81 Mg Tbdp (Aspirin) .... Take 1 by mouth qd 2)  Diovan 160 Mg Tabs (Valsartan) .... Take 1 by mouth two times a day qd 3)  Furosemide 20 Mg Tabs (Furosemide) .... Take 1 by mouth qd 4)  Norvasc 5 Mg Tabs (Amlodipine besylate) .... Take 1 by mouth qd 5)  Coreg 25 Mg Tabs (Carvedilol) .... Take 2 by mouth two times a day qd 6)  Elocon 0.1 % Crea (Mometasone furoate) .... Use asd two times a day as needed   Patient Instructions: 1)  please stop by the pharmacy to see how much one month of Azor 5/40 mg would cost, compared to the DIOVAN plus norvasc per month - if the azor costs the same or less, bring this up at your next heart doctor appt to have this changed (dr Jens Som)  2)  remember to take all of your medication all of the time 3)  use the cream as directed for the rash 4)  Please schedule a follow-up appointment in 1 year. or sooner if needed   Prescriptions: ELOCON 0.1 %  CREA (MOMETASONE FUROATE) use asd two times a day as needed  #1 x 1   Entered and Authorized by:   [name] MD   Signed by:   [name] MD on 12/04/2007   Method used:   Print then Give to Patient   RxID:   [id number]  ]

## 2010-07-20 NOTE — Letter (Signed)
Summary: Appointment - Reminder 2  [facility], Main Office  [address]   [place], [place] [address]   Phone: [phone]  Fax: [phone]     February 12, 2009 MRN: [id number]   Tyler [name] [address] [place], [place]  [address]   Dear [name],  Our records indicate that it is time to schedule a follow-up appointment.  Dr.Crenshaw recommended that you follow up with [place] in  Nov 2010 . It is very important that we reach you to schedule this appointment. We look forward to participating in your health care needs. Please contact us at the number listed above at your earliest convenience to schedule your appointment.  If you are unable to make an appointment at this time, give [place] a call so we can update our records.     Sincerely,    Lorne Skeens  [facility] Scheduling Team

## 2010-07-20 NOTE — Assessment & Plan Note (Signed)
Summary: COUGH/NWS   Vital Signs:  Patient Profile:   35 Years Old Male Weight:      354 pounds O2 Sat:      97 % O2 treatment:    Room Air Temp:     97.1 degrees F oral Pulse rate:   65 / minute BP sitting:   120 / 78  (left arm) Cuff size:   large  Vitals Entered By: Payton Spark CMA (April 17, 2008 4:25 PM)                 Chief Complaint:  Cough.  History of Present Illness: here with acute onset myalgias, fever, fatigue, st with nonprod cough, headache but no abd pains or diarrhea for 3 days - wife and son with similar in last week - son tx for bronchitis at pediatrician's    Updated Prior Medication List: ADULT ASPIRIN LOW STRENGTH 81 MG  TBDP (ASPIRIN) take 1 by mouth qd DIOVAN 160 MG  TABS (VALSARTAN) take 1 by mouth two times a day qd FUROSEMIDE 20 MG  TABS (FUROSEMIDE) take 1 by mouth qd NORVASC 5 MG  TABS (AMLODIPINE BESYLATE) take 1 by mouth qd COREG 25 MG  TABS (CARVEDILOL) take 2 by mouth two times a day qd ELOCON 0.1 %  CREA (MOMETASONE FUROATE) use asd two times a day as needed TAMIFLU 75 MG CAPS (OSELTAMIVIR PHOSPHATE) 1 by mouth two times a day TUSSIONEX PENNKINETIC ER 8-10 MG/5ML LQCR (CHLORPHENIRAMINE-HYDROCODONE) 1 tsp by mouth two times a day as needed cough  Current Allergies (reviewed today): ACE INHIBITORS  Past Medical History:    Reviewed history from 12/04/2007 and no changes required:       Hyperlipidemia       Hypertension       Nonischemic cardiomyopathy       Cough due to Ace Inhibitors       Congestive heart failure       restrictive lung disease       hx of pneumonia 2004       Coronary artery disease - minor  by cath 2006  Past Surgical History:    Reviewed history from 12/04/2007 and no changes required:       Denies surgical history   Social History:    Reviewed history 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  , mild ill  Head:     Normocephalic and atraumatic without obvious abnormalities. No apparent alopecia or balding. Eyes:     No corneal or conjunctival inflammation noted. EOMI. Perrla.  Ears:     bilat tm's red, sinus nontender Nose:     nasal dischargemucosal pallor and mucosal erythema.   Mouth:     pharyngeal erythema and fair dentition.   Neck:     supple and full ROM.   Lungs:     Normal respiratory effort, chest expands symmetrically. Lungs are clear to auscultation, no crackles or wheezes. Heart:     Normal rate and regular rhythm. S1 and S2 normal without gallop, murmur, click, rub or other extra sounds. Extremities:  no edema, no ulcers     Impression & Recommendations:  Problem # 1:  VIRAL INFECTION-UNSPEC (ICD-079.99)  His updated medication list for this problem includes:    Adult Aspirin Low Strength 81 Mg Tbdp (Aspirin) .Marland Kitchen... Take 1 by mouth qd    Tussionex Pennkinetic Er 8-10 Mg/54ml Lqcr (Chlorpheniramine-hydrocodone) .Marland Kitchen... 1 tsp by mouth two times a day as needed cough   Problem # 2:  HYPERTENSION (ICD-401.9)  His updated medication list for this problem includes:    Diovan 160 Mg Tabs (Valsartan) .Marland Kitchen... Take 1 by mouth two times a day qd    Furosemide 20 Mg Tabs (Furosemide) .Marland Kitchen... Take 1 by mouth qd    Norvasc 5 Mg Tabs (Amlodipine besylate) .Marland Kitchen... Take 1 by mouth qd    Coreg 25 Mg Tabs (Carvedilol) .Marland Kitchen... Take 2 by mouth two times a day qd  BP today: 120/78 Prior BP: 179/112 (12/04/2007)  Labs Reviewed: Creat: 1.0 (05/30/2007) Chol: 178 (05/30/2007)   HDL: 33.9 (05/30/2007)   LDL: 133 (05/30/2007)   TG: 58 (05/30/2007) stable overall by hx and exam, ok to continue meds/tx as is   Complete Medication List: 1)  Adult Aspirin Low Strength 81 Mg Tbdp (Aspirin) .... Take 1 by mouth qd 2)  Diovan 160 Mg Tabs (Valsartan) ....  Take 1 by mouth two times a day qd 3)  Furosemide 20 Mg Tabs (Furosemide) .... Take 1 by mouth qd 4)  Norvasc 5 Mg Tabs (Amlodipine besylate) .... Take 1 by mouth qd 5)  Coreg 25 Mg Tabs (Carvedilol) .... Take 2 by mouth two times a day qd 6)  Elocon 0.1 % Crea (Mometasone furoate) .... Use asd two times a day as needed 7)  Tamiflu 75 Mg Caps (Oseltamivir phosphate) .Marland Kitchen.. 1 by mouth two times a day 8)  Tussionex Pennkinetic Er 8-10 Mg/54ml Lqcr (Chlorpheniramine-hydrocodone) .Marland Kitchen.. 1 tsp by mouth two times a day as needed cough   Patient Instructions: 1)  Please take all new medications as prescribed 2)  Continue all medications that you may have been taking previously 3)  Please schedule a follow-up appointment about sept 2010 or sooner if needed   Prescriptions: TUSSIONEX PENNKINETIC ER 8-10 MG/5ML LQCR (CHLORPHENIRAMINE-HYDROCODONE) 1 tsp by mouth two times a day as needed cough  #6 oz x 1   Entered and Authorized by:   [name] MD   Signed by:   [name] MD on 04/17/2008   Method used:   Print then Give to Patient   RxID:   [id number] TAMIFLU 75 MG CAPS (OSELTAMIVIR PHOSPHATE) 1 by mouth two times a day  #10 x 0   Entered and Authorized by:   [name] MD   Signed by:   [name] MD on 04/17/2008   Method used:   Print then Give to Patient   RxID:   [id number]  ]

## 2010-07-20 NOTE — Progress Notes (Signed)
  Phone Note Call from Patient Call back at Home Phone [phone]   Caller: [phone] Call For: [name] MD Summary of Call: per [place] Coke call c/o of sweeling in his ankle  swollen hurt when he walks  Initial call taken by: Shelbie Proctor,  July 30, 2008 1:45 PM  Follow-up for Phone Call        a new problem - suggest OV or urgent care  Follow-up by: [name] MD,  July 30, 2008 2:48 PM  Additional Follow-up for Phone Call Additional follow up Details #1::        called pt to inform stated  he called dr Deborah Chalk office he increased his fluid pill Additional Follow-up by: Shelbie Proctor,  July 30, 2008 2:50 PM

## 2010-07-20 NOTE — Letter (Signed)
Summary: Results Follow-up Letter  [facility]  [address] [place], [place] [address]   Phone: [phone]  Fax: [phone]    08/13/2009  [address] [place], [place]  [address]  Dear [name],   The following are the results of your recent test(s):  Test     Result     Kidney     normal Liver       normal CBC       normal   _________________________________________________________  Please call for an appointment as directed _________________________________________________________ _________________________________________________________ _________________________________________________________  Sincerely,  Sanda Linger MD [facility]

## 2010-08-16 ENCOUNTER — Encounter: Payer: Self-pay | Admitting: Cardiology

## 2010-08-16 ENCOUNTER — Ambulatory Visit (INDEPENDENT_AMBULATORY_CARE_PROVIDER_SITE_OTHER): Payer: BC Managed Care – PPO | Admitting: Cardiology

## 2010-08-16 ENCOUNTER — Other Ambulatory Visit: Payer: Self-pay | Admitting: Cardiology

## 2010-08-16 DIAGNOSIS — I428 Other cardiomyopathies: Secondary | ICD-10-CM

## 2010-08-16 DIAGNOSIS — I10 Essential (primary) hypertension: Secondary | ICD-10-CM

## 2010-08-16 LAB — BASIC METABOLIC PANEL
BUN: 12 mg/dL (ref 6–23)
CO2: 28 mEq/L (ref 19–32)
Calcium: 9 mg/dL (ref 8.4–10.5)
Chloride: 104 mEq/L (ref 96–112)
Creatinine, Ser: 1 mg/dL (ref 0.4–1.5)
GFR: 113.53 mL/min (ref >60.00)
Glucose, Bld: 100 mg/dL — ABNORMAL HIGH (ref 70–99)
Potassium: 3.9 mEq/L (ref 3.5–5.1)
Sodium: 140 mEq/L (ref 135–145)

## 2010-08-26 NOTE — Assessment & Plan Note (Signed)
Summary: rov- gd unable to confirm appt lmom=mj   Primary Provider:  [name] MD  CC:  pt complains of tingling on right of chest.  History of Present Illness: [name] is a pleasant gentleman who has a history of hypertension and nonischemic cardiomyopathy.  However, his most recent MUGA performed on July 29, 2006, showed that his ejection fraction had improved to 60%. Echocardiogram in Nov 2009 showed that his LV function was normal. There was mild left ventricular hypertrophy and mild left atrial enlargement. Since I last saw him in Nov 2010, he denies any dyspnea on exertion, orthopnea, PND,  palpitations, syncope, pedal edema or chest pain.   Current Medications (verified): 1)  Adult Aspirin Low Strength 81 Mg  Tbdp (Aspirin) .... Take 1 By Mouth Qd 2)  Diovan 160 Mg  Tabs (Valsartan) .... Take 1 By Mouth Two Times A Day Qd 3)  Furosemide 20 Mg  Tabs (Furosemide) .... 2 Tabs By Mouth Once Daily 4)  Norvasc 5 Mg  Tabs (Amlodipine Besylate) .... Take 1 By Mouth Qd 5)  Coreg 25 Mg  Tabs (Carvedilol) .... 2 Tabs By Mouth Two Times A Day 6)  Elocon 0.1 %  Crea (Mometasone Furoate) .... Use Asd Two Times A Day As Needed 7)  Fish Oil   Oil (Fish Oil) .Marland Kitchen.. 1 Tab By Mouth Once Daily 8)  Vitamin C 100 Mg Tabs (Ascorbic Acid) .Marland Kitchen.. 1 Tab By Mouth Once Daily 9)  Multivitamins   Tabs (Multiple Vitamin) .Marland Kitchen.. 1 Tab By Mouth Once Daily  Allergies: 1)  Ace Inhibitors  Past History:  Past Medical History: CARDIOMYOPATHY (ICD-425.4) imroved CONGESTIVE HEART FAILURE (ICD-428.0) HYPERTENSION (ICD-401.9) HYPERLIPIDEMIA (ICD-272.4) SLEEP APNEA (ICD-780.57) Cough due to Ace Inhibitors restrictive lung disease Allergic rhinitis  Past Surgical History: Reviewed history from 12/04/2007 and no changes required. Denies surgical history  Social History: Reviewed history from 12/04/2007 and no changes required. 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 Remaining systems are negative.   Vital Signs:  Patient profile:   35 year old male Height:      69 inches Weight:      386 pounds BMI:     57.21 Pulse rate:   70 / minute Resp:     16 per minute BP sitting:   160 / 100  (left arm)  Vitals Entered By: Kem Parkinson (August 16, 2010 9:33 AM)  Physical Exam  General:  Well-developed obese in no acute distress.  Skin is warm and dry.  HEENT is normal.  Neck is supple. No thyromegaly.  Chest is clear to auscultation with normal expansion.  Cardiovascular exam is regular rate and rhythm.  Abdominal exam nontender or distended. No masses palpated. Extremities show no edema. neuro grossly intact    EKG  Procedure date:  08/16/2010  Findings:      Sinus rhythm with no ST changes.  Impression & Recommendations:  Problem # 1:  CARDIOMYOPATHY (ICD-425.4) Improved. Continue present medications. His updated medication list for this problem includes:    Adult Aspirin Low Strength 81 Mg Tbdp (Aspirin) .Marland Kitchen... Take 1 by mouth qd    Diovan 160 Mg Tabs (Valsartan) .Marland Kitchen... Take 1 by mouth two times a day qd    Furosemide 20 Mg Tabs (Furosemide) .Marland Kitchen... 2 tabs by mouth  once daily    Norvasc 5 Mg Tabs (Amlodipine besylate) .Marland Kitchen... Take 1 by mouth qd    Coreg 25 Mg Tabs (Carvedilol) .Marland Kitchen... 2 tabs by mouth two times a day  Problem # 2:  HYPERTENSION (ICD-401.9) Blood pressure elevated today. However he has not taken his medications. We will follow this and we can increase as needed. He states it is typically normal with medications. Check potassium and renal function. His updated medication list for this problem includes:    Adult Aspirin Low Strength 81 Mg Tbdp (Aspirin) .Marland Kitchen... Take 1 by mouth qd    Diovan 160 Mg Tabs (Valsartan) .Marland Kitchen... Take 1 by  mouth two times a day qd    Furosemide 20 Mg Tabs (Furosemide) .Marland Kitchen... 2 tabs by mouth once daily    Norvasc 5 Mg Tabs (Amlodipine besylate) .Marland Kitchen... Take 1 by mouth qd    Coreg 25 Mg Tabs (Carvedilol) .Marland Kitchen... 2 tabs by mouth two times a day  Orders: TLB-BMP (Basic Metabolic Panel-BMET) (80048-METABOL)  Problem # 3:  HYPERLIPIDEMIA (ICD-272.4) Management per primary care.  Problem # 4:  SLEEP APNEA (ICD-780.57)  Patient Instructions: 1)  DR DAVE GRAPEY=[id number] UROLOGIST

## 2010-09-02 LAB — POCT I-STAT, CHEM 8
BUN: 15 mg/dL (ref 6–23)
Calcium, Ion: 1.14 mmol/L (ref 1.12–1.32)
Chloride: 105 mEq/L (ref 96–112)
Creatinine, Ser: 1 mg/dL (ref 0.4–1.5)
Glucose, Bld: 114 mg/dL — ABNORMAL HIGH (ref 70–99)
HCT: 43 % (ref 39.0–52.0)
Hemoglobin: 14.6 g/dL (ref 13.0–17.0)
Potassium: 3.7 mEq/L (ref 3.5–5.1)
Sodium: 142 mEq/L (ref 135–145)
TCO2: 29 mmol/L (ref 0–100)

## 2010-09-02 LAB — DIFFERENTIAL
Basophils Absolute: 0 10*3/uL (ref 0.0–0.1)
Basophils Relative: 1 % (ref 0–1)
Eosinophils Absolute: 0.2 10*3/uL (ref 0.0–0.7)
Eosinophils Relative: 5 % (ref 0–5)
Lymphocytes Relative: 35 % (ref 12–46)
Lymphs Abs: 1.7 10*3/uL (ref 0.7–4.0)
Monocytes Absolute: 0.6 10*3/uL (ref 0.1–1.0)
Monocytes Relative: 12 % (ref 3–12)
Neutro Abs: 2.4 10*3/uL (ref 1.7–7.7)
Neutrophils Relative %: 49 % (ref 43–77)

## 2010-09-02 LAB — CBC
HCT: 40.1 % (ref 39.0–52.0)
Hemoglobin: 13.5 g/dL (ref 13.0–17.0)
MCH: 28.4 pg (ref 26.0–34.0)
MCHC: 33.7 g/dL (ref 30.0–36.0)
MCV: 84.2 fL (ref 78.0–100.0)
Platelets: 174 10*3/uL (ref 150–400)
RBC: 4.76 MIL/uL (ref 4.22–5.81)
RDW: 14.6 % (ref 11.5–15.5)
WBC: 4.9 10*3/uL (ref 4.0–10.5)

## 2010-09-05 LAB — POCT RAPID STREP A (OFFICE): Streptococcus, Group A Screen (Direct): POSITIVE — AB

## 2010-09-07 ENCOUNTER — Other Ambulatory Visit: Payer: Self-pay | Admitting: Cardiology

## 2010-10-04 ENCOUNTER — Inpatient Hospital Stay (INDEPENDENT_AMBULATORY_CARE_PROVIDER_SITE_OTHER)
Admission: RE | Admit: 2010-10-04 | Discharge: 2010-10-04 | Disposition: A | Discharge status: Home / Self Care | Payer: BC Managed Care – PPO | Source: Ambulatory Visit | Attending: Family Medicine | Admitting: Family Medicine

## 2010-10-04 ENCOUNTER — Ambulatory Visit: Payer: BC Managed Care – PPO | Admitting: Internal Medicine

## 2010-10-04 DIAGNOSIS — J309 Allergic rhinitis, unspecified: Secondary | ICD-10-CM

## 2010-10-04 DIAGNOSIS — J40 Bronchitis, not specified as acute or chronic: Secondary | ICD-10-CM

## 2010-11-02 NOTE — Assessment & Plan Note (Signed)
[facility]                            CARDIOLOGY OFFICE NOTE   Tyler [name]                      MRN:          [id number]  DATE:04/28/2008                            DOB:          June 25, 1975    [name] is a pleasant gentleman who has a history of hypertension  and nonischemic cardiomyopathy.  However, his most recent MUGA performed  on July 29, 2006, showed that his ejection fraction had improved to  60%.  Since I last saw him, he is doing well symptomatically.  He did  have a recent URI, but has otherwise had no dyspnea, chest pain,  palpitations, or syncope.  There is no pedal edema.   MEDICATIONS:  1. Aspirin 81 mg p.o. daily.  2. Diovan 160 mg p.o. b.i.d.  3. Lasix 20 mg p.o. daily.  4. Norvasc 10 mg p.o. daily.  5. Coreg 50 mg p.o. b.i.d.  6. Multivitamin.  7. Fish oil.   PHYSICAL EXAMINATION:  VITAL SIGNS:  Blood pressure of 126/76 and his  pulse is 56.  He weighs 355 pounds.  HEENT:  Normal.  NECK:  Supple.  CHEST:  Clear.  CARDIOVASCULAR:  Regular rate and rhythm.  ABDOMEN:  No tenderness.  EXTREMITIES:  No edema.   Electrocardiogram shows sinus rhythm at a rate of 54.  The axis is  normal.  There are no significant ST changes.   DIAGNOSES:  1. History of nonischemic cardiomyopathy - his ejection fraction is      improved by most recent MUGA in February 2008.  We will plan to      repeat his echocardiogram.  He will continue on his ARB, Lasix, and      Coreg.  2. Hypertension - his blood pressure is adequately controlled on his      present medications.  I will check a BMET and follow his potassium      and renal function.  3. History of cough with ACE inhibition.  4. History of hyperlipidemia - we will check lipids and add additional      medications as indicated.  5. History of sleep apnea.   We will see him back in 12 months.     Tyler [name] Tyler Som, MD, [facility]  Electronically Signed    BSC/MedQ  DD:  04/28/2008  DT: 04/29/2008  Job #: [id number]

## 2010-11-02 NOTE — Assessment & Plan Note (Signed)
[place] [facility]                            CARDIOLOGY OFFICE NOTE   NAME:Tyler [name]                      MRN:          [id number]  DATE:05/30/2007                            DOB:          12-19-1975    Tyler [name] is a 35 year old gentleman who has a history of hypertension and a  nonischemic cardiomyopathy.  His most recent MUGA was performed on  July 29, 2006 and his ejection fraction was 60% which had improved.  Since I last saw him there is no dyspnea, chest pain, syncope or pedal  edema.  However, he does state that over the past 2 months he has  noticed fatigue feeling as well as mild nausea after taking his BiDil.  He ran out of this medication for 4 days and when he was not taken it he  was much improved.   PRESENT MEDICATIONS:  1. Aspirin 81 mg p.o. daily.  2. Diovan 160 mg p.o. b.i.d.  3. Digitek 0.125 mg p.o. daily.  4. Lasix 20 mg p.o. daily.  5. Norvasc 5 mg p.o. daily.  6. Coreg 50 mg p.o. b.i.d.  7. BiDil 1 tablet p.o. b.i.d.  8. Multivitamin.   PHYSICAL EXAMINATION:  Shows a blood pressure of 136/80 and his pulse is  64.  HEENT:  Normal.  NECK:  Supple.  CHEST:  Clear.  CARDIOVASCULAR:  Reveals a regular rate and rhythm.  ABDOMINAL:  Shows no tenderness.  EXTREMITIES:  Show no edema.   DIAGNOSES:  1. History of nonischemic cardiomyopathy -- His ejection fraction is      improved to normal by his most recent MUGA.  We will continue with      medical therapy.  I will continue with his Diovan, Lasix, Coreg.      He is complaining of a weak feeling and nausea after taking his      BiDil which did improve after he missed several doses.  We will      therefore discontinue this medication.  I wonder if his blood      pressure may be decreasing immediately after taking his BiDil.  He      states it resolves after approximately 30 minutes.  We will      continue off of this medication and I am concerned that his blood      pressure  may increase off of that higher dose of BiDil.  We will      therefore increase his Norvasc to 10 mg p.o. daily.  He will track      his blood pressure and we will adjust his medications as indicated.      Note, I will discontinue his digoxin now that his left ventricular      function has improved.  2. Hypertension -- We will adjust his medications as described above.      I have asked him to track his blood pressure and I will see him      back in approximately 4 weeks to make sure that he is not becoming      hypertensive off of his  BiDil.  3. History of cough with angiotensin converting enzyme inhibitors.  4. History of hyperlipidemia -- He has made significant lifestyle      changes.  He is exercising and trying to follow a diet.  We will      plan to repeat his lipids and liver and I would recommend a statin      if his LDL remains as high as it has been in the past.  Note, he      was not willing to take this previously but he would consider it      now.  We will also check a BMET.  5. History of sleep apnea.   I will see him back in 4-6 weeks as described above.  Note, we will also  give the flu shot today.     Madolyn Frieze Jens Som, MD, [facility]  Electronically Signed    BSC/MedQ  DD: 05/30/2007  DT: 05/30/2007  Job #: [id number]

## 2010-11-05 NOTE — Assessment & Plan Note (Signed)
[facility]                            CARDIOLOGY OFFICE NOTE   Tyler [name]                      MRN:          [id number]  DATE:07/19/2006                            DOB:          02/14/1976    Tyler [name] is a gentleman who has nonischemic cardiomyopathy.  Since I  last saw him, he has denies any dyspnea, chest pain, palpitations or  syncope.  There is no pedal edema.  He has noticed that his blood  pressure has been elevated.  He recently resumed his Biodel at 2 pills 3  times daily, and he had been taking it twice daily.   MEDICATIONS:  1. Aspirin 81 mg p.o. daily.  2. Diovan 160 mg p.o. b.i.d.  3. Digitek 0.125 mg p.o. daily.  4. Lasix 20 mg p.o. daily.  5. Coreg 50 mg p.o. b.i.d.  6. Biodel 1 tablet 2 p.o. t.i.d.   PHYSICAL EXAMINATION:  Shows a blood pressure of 140/102.  His pulse is  336.  NECK:  Supple.  CHEST:  Clear.  CARDIOVASCULAR EXAM:  Regular rate and rhythm.  EXTREMITIES:  Show no edema.  His electrocardiogram shows a sinus rhythm at a rate of 58.  There are  nonspecific ST changes.   DIAGNOSES:  1. Nonischemic cardiomyopathy.  2. Hypertension.  3. History of cough with ACE inhibitors.  4. History of sleep apnea.   PLAN:  [name] is doing well from a symptomatic standpoint.  However, his blood pressure remains elevated.  We will add Norvasc 5 mg  p.o. daily and increase as needed.  We will check a BMET today to follow  his potassium and renal function as well as a BNP.  We will plan to  repeat his MUGA to reassess his LV function.  Finally, he did have an  LDL of 183 previously on laboratories.  We recommended adding Zocor 40  mg p.o. nightly, but he did not initiate this, and he would prefer not  to do this at this point.  He would prefer diet and exercise and repeat  this in 3 months, and we will do that when he returns to see me.     Madolyn Frieze Jens Som, MD, [facility]  Electronically Signed    BSC/MedQ   DD: 07/19/2006  DT: 07/19/2006  Job #: [id number]

## 2010-11-05 NOTE — Assessment & Plan Note (Signed)
[facility]                              CARDIOLOGY OFFICE NOTE   Jerrye Beavers LENO MATHES                      MRN:          [id number]  DATE:03/06/2006                            DOB:          1975/09/29    Tyler [name] is a gentleman with nonischemic cardiomyopathy.  Since I last  saw him, there is no dyspnea, chest pain, palpitations, syncope, or pedal  edema.   MEDICATIONS:  1. Aspirin 81 mg p.o. daily.  2. Diovan 160 mg p.o. b.i.d.  3. Digitek 0.125 mg p.o. daily.  4. Lasix 20 mg p.o. daily.  5. Coreg 50 mg p.o. b.i.d.  6. BiDil 1 mg p.o. t.i.d.   PHYSICAL EXAMINATION:  VITAL SIGNS:  His physical exam today shows a blood  pressure of 140/89, and his pulse is 58.  He weighs 345 pounds.  NECK:  Supple.  CHEST:  Clear.  CARDIOVASCULAR:  Regular rate and rhythm.  EXTREMITIES:  Show no edema.   His electrocardiogram shows a normal sinus rhythm at a rate of 60.  There  are nonspecific ST changes.   DIAGNOSES:  1. Nonischemic cardiomyopathy.  2. Hypertension.  3. History of cough with ACE inhibitors.  4. History of sleep apnea.   PLAN:  Tyler [name] is now class I congestive heart failure.  His ejection  fraction at MUGA was recently 42%.  We will continue with medical therapy.  We will increase his BiDil to 2 tablets p.o. t.i.d.  I will also check a  BMET today to follow his potassium and renal function.  We will see him back  in six months.                              Madolyn Frieze Jens Som, MD, [facility]    BSC/MedQ  DD:  03/06/2006  DT:  03/07/2006  Job #:  [id number]

## 2010-11-05 NOTE — Cardiovascular Report (Signed)
[name], [name] NO.:  [id number]   MEDICAL RECORD NO.:  [id number]          PATIENT TYPE:  AMB   LOCATION:                               FACILITY:  [facility]   PHYSICIAN:  Rollene Rotunda, M.D.   DATE OF BIRTH:  05-Jan-1976   DATE OF PROCEDURE:  DATE OF DISCHARGE:                              CARDIAC CATHETERIZATION   PRIMARY CARE PHYSICIAN:  None.   PULMOLOGIST:  Dr. Fannie Knee   PROCEDURE:  Left heart catheterization/coronary arteriography.   INDICATIONS:  Evaluate patient with cardiomyopathy.   PROCEDURE NOTE:  Left heart catheterization performed via the right brachial  approach.  The vessel was cannulated using an anterior wall puncture.  A #6-  French brachial sheath was inserted.  4000 units of heparin was  administered.  Judkins, pigtail, and Castillo catheters were utilized.  The  patient tolerated the procedure well and left the laboratory in stable  condition.   HEMODYNAMICS:  LV 137/30, AO 145/103.   CORONARIES:  The left main was normal.   The LAD was large wrapping the apex.  It was normal throughout its course.  There was moderate size first and second diagonal which was normal.   The circumflex in the mid AV groove had 25% stenosis after a large mid  obtuse marginal.  First obtuse marginal was large and branching and normal.  The second obtuse marginal was small to moderate sized and normal.   The right coronary artery was a dominant vessel.  There was diffuse 25-30%  plaque throughout the course of this vessel.  The PDA was moderate in size  with diffuse 25-30% plaque.   LEFT VENTRICULOGRAM:  The left ventriculogram was obtained in the RAO  projection.  EF was 20% with global hypokinesis.   CONCLUSION:  Nonischemic cardiomyopathy with nonobstructive coronary plaque.   PLAN:  The patient will be managed medically for his nonischemic  cardiomyopathy.       ___________________________________________  Rollene Rotunda, M.D.    JH/MEDQ  D:  10/07/2004  T:  10/07/2004  Job:  [id number]   cc:   Joni Fears D. Maple Hudson, M.D.   Olga Millers, M.D. [facility]

## 2010-11-05 NOTE — Procedures (Signed)
[place] [facility]                             NUCLEAR IMAGING STUDY   Tyler [name]                      MRN:          [id number]  DATE:07/29/2006                            DOB:          [birth year]    REFERRING PHYSICIAN:  Madolyn Frieze. Jens Som, MD, [facility]   MUGA STUDY:   REFERRING:  1. Madolyn Frieze. Jens Som, MD, [facility]  2. [name], M.D.   CLINICAL DATA:  A 35 year old gentleman with a history of non-ischemic  cardiomyopathy.   The patient's erythrocytes were labeled using the Ultratag method with  32 mCi of technetium-99 labeled per technitate.  Images were  reconstructed in the anterior, lateral and left anterior oblique views.  Regional and global left ventricular systolic function were entirely  normal.  Estimated ejection fraction was 0.60.  When compared to a prior  study performed in January of 2007, there has been continuing recovery  of left ventricular systolic function.     [name]. Dietrich Pates, MD, [facility]  Electronically Signed    RMR/MedQ  DD: 07/31/2006  DT: 07/31/2006  Job #: [id number]

## 2010-11-05 NOTE — Assessment & Plan Note (Signed)
[facility]                            CARDIOLOGY OFFICE NOTE   Tyler [name] Tyler [name]                      MRN:          [id number]  DATE:10/20/2006                            DOB:          Jul 28, 1975    [name] is a very pleasant gentleman who has a nonischemic  cardiomyopathy. However, we did perform a MUGA on him on July 29, 2006 and his ejection fraction has now improved to 60%. Since I last saw  him he denies any chest pain, orthopnea, PND, pedal edema, palpitations,  or syncope.   His medications include;  1. Aspirin 81 mg p.o. daily.  2. Diovan 160 mg p.o. b.i.d.  3. Digitek 0.25 mg p.o. daily.  4. Lasix 20 mg p.o. daily.  5. BiDil 2 p.o. t.i.d.  6. Norvasc 5 mg p.o. daily.  7. Coreg 50 mg p.o. b.i.d.   PHYSICAL EXAMINATION:  Shows a blood pressure of 103/64 and his pulse is  75. He weighs 322 pounds.  CHEST: Clear.  CARDIOVASCULAR EXAM: Reveals a regular rate and rhythm.  EXTREMITIES: Show no edema.   DIAGNOSES:  1. Nonischemic cardiomyopathy, now improved by MUGA. We will continue      with his present medications as his blood pressure is well      controlled. He will need to return in 3 months for a BMET. I will      otherwise see him back in 9 months.  2. Hypertension- his blood pressure is controlled on his present      medications.  3. History of cough with ACE inhibitors.  4. Hyperlipidemia- he would prefer to continue with diet and has not      wished to proceed with statin in the past.  5. History of sleep apnea.     Tyler Frieze Jens Som, MD, [facility]  Electronically Signed    BSC/MedQ  DD: 10/20/2006  DT: 10/20/2006  Job #: [id number]

## 2010-11-05 NOTE — Discharge Summary (Signed)
NAMEDINERO, CHAVIRA NO.:  [id number]   MEDICAL RECORD NO.:  [id number]                   PATIENT TYPE:  INP   LOCATION:  5531                                 FACILITY:  [facility]   PHYSICIAN:  Candyce Churn, M.D.          DATE OF BIRTH:  1975-09-10   DATE OF ADMISSION:  07/11/2002  DATE OF DISCHARGE:  07/14/2002                                 DISCHARGE SUMMARY   DISCHARGE DIAGNOSES:  1. Left lower lobe pneumatic, symptomatically improved.  2. Mild dehydration, resolved.  3. Hypertension.   DISCHARGE MEDICATIONS:  1. Diovan/HCT 160/12.5 1 q.d.  2. Norvasc 5 mg q.d.  3. Tequin 400 mg q.d. x  7 more days.   HISTORY OF PRESENT ILLNESS:  The patient is a 35 year old male with a  history of obesity and hypertension, who presented [facility]  emergency room with a week history of cough. He was seen on July 09, 2002, at the [facility] ER and diagnosed with left lobe pneumonia  and put on Zithromax and Tussionex. He also got a shot of Rocephin.   After 2 days of still having  fevers and coughing and feeling short of  breath, he presented to [facility] and was seen by Dr.  Georgann Housekeeper and his O2 saturation on room air was 86%. He was admitted for  IV antibiotics and IV fluids. The patient was having  nausea and vomiting.   HOSPITAL COURSE:  The patient was admitted on July 09, 2002, and started  on intravenous Rocephin and azithromycin. He improved on a daily basis, but  was febrile on the 1st and 2nd days of admission. His T-max on the day prior  to  discharge was 101 and he was  afebrile on the day of discharge.   On the day of discharge his lungs were clear to auscultation. Cardiac was  regular rate and rhythm without murmurs, rubs, gallops and his abdomen was  soft, nontender, extremities without edema. His vital signs revealed a blood  pressure 169/100, but he had not been on blood pressure  medications while  hospitalized. Pulse was 75 and regular. Respiratory rate was 18 and easy.   The plan on discharge was to resume hypertensive medications and treat with  Tequin 400 mg q.d. x 7 more days. I will try to provide samples from the  office for his medications.   DISPOSITION:  The patient will be discharged to home and will follow up in  the office later this week.   DISCHARGE INSTRUCTIONS:  He will notify me if he develops shortness of  breath or temperature of 101 or greater. I think he can resume work on  July 17, 2002, if he continues to feel better.  Candyce Churn, M.D.   RNG/MEDQ  D:  07/14/2002  T:  07/14/2002  Job:  [id number]
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
## 2010-12-09 ENCOUNTER — Other Ambulatory Visit: Payer: Self-pay | Admitting: Cardiology

## 2011-01-16 ENCOUNTER — Emergency Department (HOSPITAL_BASED_OUTPATIENT_CLINIC_OR_DEPARTMENT_OTHER)
Admission: EM | Admit: 2011-01-16 | Discharge: 2011-01-16 | Disposition: A | Discharge status: Home / Self Care | Payer: BC Managed Care – PPO | Attending: Emergency Medicine | Admitting: Emergency Medicine

## 2011-01-16 ENCOUNTER — Encounter: Payer: Self-pay | Admitting: *Deleted

## 2011-01-16 DIAGNOSIS — I509 Heart failure, unspecified: Secondary | ICD-10-CM | POA: Insufficient documentation

## 2011-01-16 DIAGNOSIS — L600 Ingrowing nail: Secondary | ICD-10-CM

## 2011-01-16 DIAGNOSIS — I10 Essential (primary) hypertension: Secondary | ICD-10-CM | POA: Insufficient documentation

## 2011-01-16 HISTORY — DX: Heart failure, unspecified: I50.9

## 2011-01-16 HISTORY — DX: Essential (primary) hypertension: I10

## 2011-01-16 MED ORDER — CEPHALEXIN 500 MG PO CAPS: 500.0000 mg | ORAL_CAPSULE | Freq: Four times a day (QID) | ORAL | Status: AC

## 2011-01-16 MED ORDER — LIDOCAINE HCL 2 % IJ SOLN
20.0000 mL | Freq: Once | INTRAMUSCULAR | Status: AC
  Administered 2011-01-16: 400 mg

## 2011-01-16 MED ORDER — LIDOCAINE HCL 2 % IJ SOLN
INTRAMUSCULAR | Status: AC
  Administered 2011-01-16: 400 mg
  Filled 2011-01-16: qty 1

## 2011-01-16 NOTE — ED Provider Notes (Signed)
History     Chief Complaint  Patient presents with  . Ingrown Toenail   HPI Comments: Pt states that he tried to remove the part of the nail.pt states that the symptoms seemed like them were getting better and then he went to the gym and now he is having worsening drainage  Patient is a 35 y.o. male presenting with toe pain. The history is provided by the patient. No language interpreter was used.  Toe Pain This is a new problem. The current episode started in the past 7 days. The problem occurs constantly. The problem has been gradually worsening. Pertinent negatives include no numbness or weakness. Exacerbated by: palpation. He has tried nothing for the symptoms.    Past Medical History  Diagnosis Date  . Hypertension   . CHF (congestive heart failure)     History reviewed. No pertinent past surgical history.  No family history on file.  History  Substance Use Topics  . Smoking status: Never Smoker   . Smokeless tobacco: Not on file  . Alcohol Use: No      Review of Systems  Constitutional: Negative.   Respiratory: Negative.   Cardiovascular: Negative.   Skin:       Pt c/o drainage from the left great toe nail   Neurological: Negative for weakness and numbness.    Physical Exam  BP 130/80  Pulse 74  Temp(Src) 98.4 F (36.9 C) (Oral)  Resp 22  SpO2 98%  Physical Exam  Vitals reviewed. Constitutional: He appears well-developed and well-nourished.  Cardiovascular: Normal rate and regular rhythm.   Pulmonary/Chest: Effort normal and breath sounds normal.  Musculoskeletal: Normal range of motion.  Neurological: He is alert.  Skin:       Pt has a localized area of tender and tenderness to the lateral aspect of the left great toe    ED Course  NAIL REMOVAL Date/Time: 01/16/2011 4:17 PM Performed by: Teressa Lower Authorized by: Glynn Octave Consent: Verbal consent obtained. Written consent not obtained. Risks and benefits: risks, benefits and  alternatives were discussed Consent given by: patient Patient understanding: patient states understanding of the procedure being performed Patient identity confirmed: verbally with patient Time out: Immediately prior to procedure a "time out" was called to verify the correct patient, procedure, equipment, support staff and site/side marked as required. Location: left foot Anesthesia: digital block Local anesthetic: lidocaine 2% without epinephrine Patient sedated: no Preparation: skin prepped with Betadine Amount removed: partial Wedge excision of skin of nail fold: yes Nail bed sutured: no Nail matrix removed: partial Removed nail replaced and anchored: no Patient tolerance: Patient tolerated the procedure well with no immediate complications.    MDM Pt nail partially removed to fix the ingrow toenail:pt has some mild infection to the area will treat for infection:pt states tetanus utd      Teressa Lower, NP 01/16/11 1620

## 2011-01-16 NOTE — ED Notes (Signed)
Right foot soaked in betadine mixed in water.

## 2011-01-16 NOTE — ED Notes (Signed)
Ingrown left great toenail for a few. Drainage and soreness.

## 2011-01-17 NOTE — ED Provider Notes (Signed)
Medical screening examination/treatment/procedure(s) were performed by non-physician practitioner and as supervising physician I was immediately available for consultation/collaboration.   Glynn Octave, MD 01/17/11 [phone]

## 2011-01-20 ENCOUNTER — Other Ambulatory Visit: Payer: Self-pay | Admitting: Cardiology

## 2011-04-28 ENCOUNTER — Ambulatory Visit (INDEPENDENT_AMBULATORY_CARE_PROVIDER_SITE_OTHER): Payer: BC Managed Care – PPO | Admitting: Internal Medicine

## 2011-04-28 ENCOUNTER — Encounter: Payer: Self-pay | Admitting: Internal Medicine

## 2011-04-28 VITALS — BP 132/80 | HR 78 | Temp 98.4°F | Ht 69.0 in | Wt 373.0 lb

## 2011-04-28 DIAGNOSIS — Z0001 Encounter for general adult medical examination with abnormal findings: Secondary | ICD-10-CM | POA: Insufficient documentation

## 2011-04-28 DIAGNOSIS — I10 Essential (primary) hypertension: Secondary | ICD-10-CM

## 2011-04-28 DIAGNOSIS — L600 Ingrowing nail: Secondary | ICD-10-CM | POA: Insufficient documentation

## 2011-04-28 DIAGNOSIS — J209 Acute bronchitis, unspecified: Secondary | ICD-10-CM

## 2011-04-28 DIAGNOSIS — Z0000 Encounter for general adult medical examination without abnormal findings: Secondary | ICD-10-CM

## 2011-04-28 DIAGNOSIS — Z3009 Encounter for other general counseling and advice on contraception: Secondary | ICD-10-CM

## 2011-04-28 MED ORDER — HYDROCODONE-HOMATROPINE 5-1.5 MG/5ML PO SYRP: 5.0000 mL | ORAL_SOLUTION | Freq: Four times a day (QID) | ORAL | Status: AC | PRN

## 2011-04-28 MED ORDER — AZITHROMYCIN 250 MG PO TABS: ORAL_TABLET | ORAL | Status: AC

## 2011-04-28 NOTE — Assessment & Plan Note (Signed)
Lateral left great toe - for podiatry referral, no s/s infection, ulcer,  to f/u any worsening symptoms or concerns

## 2011-04-28 NOTE — Patient Instructions (Addendum)
Take all new medications as prescribed Continue all other medications as before You will be contacted regarding the referral for: podiatry, and urology Please return in 3 mo with Lab testing done 3-5 days before

## 2011-04-28 NOTE — Assessment & Plan Note (Signed)
Mild to mod, for antibx course,  to f/u any worsening symptoms or concerns, also for cough med prn 

## 2011-04-28 NOTE — Assessment & Plan Note (Signed)
stable overall by hx and exam, most recent data reviewed with pt, and pt to continue medical treatment as before  BP Readings from Last 3 Encounters:  04/28/11 132/80  01/16/11 130/80  08/16/10 160/100

## 2011-04-28 NOTE — Progress Notes (Signed)
Subjective:    Patient ID: Tyler [name], male    DOB: May 16, 1976, 35 y.o.   MRN: [id number]  HPI  Here with acute onset mild to mod 2-3 days ST, HA, general weakness and malaise, with prod cough greenish sputum, but Pt denies chest pain, increased sob or doe, wheezing, orthopnea, PND, increased LE swelling, palpitations, dizziness or syncope.  Pt denies new neurological symptoms such as new headache, or facial or extremity weakness or numbness   Pt denies polydipsia, polyuria  Pt states overall good compliance with meds, trying to follow lower cholesterol, diabetic diet, wt overall stable but little exercise however.  Does have 3 mo increaseingly symptomatic left great toe ingrown nail pain, now with some assoc swelling, worse pain to stand and walk, overall mild but consistent. No fever, red streaks,drainage, ulcer or hx of same.  Also needs referral for vasectomy per his request Past Medical History  Diagnosis Date  . Hypertension   . CHF (congestive heart failure)   . ACHILLES TENDINITIS, BILATERAL 10/16/2008  . ALLERGIC RHINITIS 08/13/2009  . CARDIOMYOPATHY 04/21/2009  . CONGESTIVE HEART FAILURE 12/04/2007  . CORONARY ARTERY DISEASE 12/04/2007  . HYPERLIPIDEMIA 03/12/2007  . HYPERTENSION 03/12/2007  . OBESITY 04/21/2009  . SLEEP APNEA 03/12/2007   No past surgical history on file.  reports that he has never smoked. He does not have any smokeless tobacco history on file. He reports that he does not drink alcohol or use illicit drugs. family history includes Asthma in his brother and Hypertension in his other. No Known Allergies Current Outpatient Prescriptions on File Prior to Visit  Medication Sig Dispense Refill  . albuterol (PROVENTIL,VENTOLIN) 90 MCG/ACT inhaler Inhale 2 puffs into the lungs 3 (three) times daily as needed. For coughing and wheezing        . amLODipine (NORVASC) 5 MG tablet TAKE ONE TABLET BY MOUTH EVERY DAY  90 tablet  3  . aspirin 81 MG tablet Take 81 mg by mouth  daily.        . carvedilol (COREG) 25 MG tablet        . FISH OIL-CHOLECALCIFEROL PO Take 1 capsule by mouth daily.        . furosemide (LASIX) 20 MG tablet        . Multiple Vitamins-Minerals (MULTIVITAMIN WITH MINERALS) tablet Take 1 tablet by mouth daily.        Marland Kitchen PRESCRIPTION MEDICATION Apply 1 application topically daily as needed. Dry skin cream for face       . valsartan (DIOVAN) 160 MG tablet Take 160 mg by mouth 2 (two) times daily.        Marland Kitchen amLODipine (NORVASC) 2.5 MG tablet Take 2.5 mg by mouth daily.         Review of Systems Review of Systems  Constitutional: Negative for diaphoresis and unexpected weight change.  HENT: Negative for drooling and tinnitus.   Eyes: Negative for photophobia and visual disturbance.  Respiratory: Negative for choking and stridor.   Gastrointestinal: Negative for vomiting and blood in stool.  Genitourinary: Negative for hematuria and decreased urine volume.  Musculoskeletal: Negative for gait problem. Marland Kitchen  except for that noted above    Objective:   Physical Exam BP 132/80  Pulse 78  Temp(Src) 98.4 F (36.9 C) (Oral)  Ht 5\' 9"  (1.753 m)  Wt 373 lb (169.192 kg)  BMI 55.08 kg/m2  SpO2 96% Physical Exam  VS noted, mild ill Constitutional: Pt appears well-developed and well-nourished.  HENT: Head: Normocephalic.  Right Ear: External ear normal.  Left Ear: External ear normal.  Bilat tm's mild erythema.  Sinus nontender.  Pharynx mild erythema Eyes: Conjunctivae and EOM are normal. Pupils are equal, round, and reactive to light.  Neck: Normal range of motion. Neck supple.  Cardiovascular: Normal rate and regular rhythm.   Pulmonary/Chest: Effort normal and breath sounds normal.  Neurological: Pt is alert. No cranial nerve deficit.  Skin: Skin is warm. No erythema. left great toenail with lateral aspect 1+ ingrown, mild tender/re/swelling without paronychia or drainage Psychiatric: Pt behavior is normal. Thought content normal.       Assessment & Plan:

## 2011-05-24 ENCOUNTER — Ambulatory Visit: Payer: BC Managed Care – PPO

## 2011-07-13 ENCOUNTER — Encounter: Payer: Self-pay | Admitting: Cardiology

## 2011-07-13 ENCOUNTER — Ambulatory Visit (INDEPENDENT_AMBULATORY_CARE_PROVIDER_SITE_OTHER): Payer: BC Managed Care – PPO | Admitting: Cardiology

## 2011-07-13 DIAGNOSIS — I10 Essential (primary) hypertension: Secondary | ICD-10-CM

## 2011-07-13 DIAGNOSIS — R079 Chest pain, unspecified: Secondary | ICD-10-CM | POA: Insufficient documentation

## 2011-07-13 DIAGNOSIS — E785 Hyperlipidemia, unspecified: Secondary | ICD-10-CM

## 2011-07-13 DIAGNOSIS — I428 Other cardiomyopathies: Secondary | ICD-10-CM

## 2011-07-13 NOTE — Assessment & Plan Note (Signed)
Patient's symptoms sound to be GI related; suggested fu with his primary care and over the counter pepcid.

## 2011-07-13 NOTE — Assessment & Plan Note (Signed)
Blood pressure controlled. Continue present medications and followup with primary care.

## 2011-07-13 NOTE — Assessment & Plan Note (Signed)
Plan repeat echocardiogram. Continue beta blocker and ARB. If LV function remains normal he will continue same medications and followup as needed.

## 2011-07-13 NOTE — Progress Notes (Signed)
   HPI:[name] is a pleasant gentleman who has a history of hypertension and nonischemic cardiomyopathy.  Cardiac catheterization in 2006 revealed normal left main, normal LAD, 25% circumflex, 25-30% right coronary artery and 25-30% PDA. Ejection fraction was 20%. However, his most recent MUGA performed on July 29, 2006, showed that his ejection fraction had improved to 60%. Echocardiogram in Nov 2009 showed that his LV function was normal. There was mild left ventricular hypertrophy and mild left atrial enlargement. Since I last saw him in Feb 2012, he denies any dyspnea on exertion, orthopnea, PND,  palpitations, syncope, pedal edema or exertional chest pain. He does describe occasional burning pain in the left upper chest. It occurs after eating and improves with Rolaids and belching.  Current Outpatient Prescriptions  Medication Sig Dispense Refill  . albuterol (PROVENTIL,VENTOLIN) 90 MCG/ACT inhaler Inhale 2 puffs into the lungs 3 (three) times daily as needed. For coughing and wheezing        . amLODipine (NORVASC) 5 MG tablet TAKE ONE TABLET BY MOUTH EVERY DAY  90 tablet  3  . aspirin 81 MG tablet Take 81 mg by mouth daily.        . carvedilol (COREG) 25 MG tablet        . FISH OIL-CHOLECALCIFEROL PO Take 1 capsule by mouth daily.        . furosemide (LASIX) 20 MG tablet Take 40 mg by mouth daily.       . Multiple Vitamins-Minerals (MULTIVITAMIN WITH MINERALS) tablet Take 1 tablet by mouth daily.        Marland Kitchen PRESCRIPTION MEDICATION Apply 1 application topically daily as needed. Dry skin cream for face       . valsartan (DIOVAN) 160 MG tablet Take 160 mg by mouth 2 (two) times daily.           Past Medical History  Diagnosis Date  . Hypertension   . CHF (congestive heart failure)   . ACHILLES TENDINITIS, BILATERAL 10/16/2008  . ALLERGIC RHINITIS 08/13/2009  . CARDIOMYOPATHY 04/21/2009  . CONGESTIVE HEART FAILURE 12/04/2007  . CORONARY ARTERY DISEASE 12/04/2007  . HYPERLIPIDEMIA  03/12/2007  . HYPERTENSION 03/12/2007  . OBESITY 04/21/2009  . SLEEP APNEA 03/12/2007    No past surgical history on file.  History   Social History  . Marital Status: Married    Spouse Name: N/A    Number of Children: 2  . Years of Education: N/A   Occupational History  .     Social History Main Topics  . Smoking status: Never Smoker   . Smokeless tobacco: Never Used  . Alcohol Use: No  . Drug Use: No  . Sexually Active: Not on file   Other Topics Concern  . Not on file   Social History Narrative  . No narrative on file    ROS: no fevers or chills, productive cough, hemoptysis, dysphasia, odynophagia, melena, hematochezia, dysuria, hematuria, rash, seizure activity, orthopnea, PND, pedal edema, claudication. Remaining systems are negative.  Physical Exam: Well-developed morbidly obese in no acute distress.  Skin is warm and dry.  HEENT is normal.  Neck is supple. No thyromegaly.  Chest is clear to auscultation with normal expansion.  Cardiovascular exam is regular rate and rhythm.  Abdominal exam nontender or distended. No masses palpated. Extremities show no edema. neuro grossly intact  ECG normal sinus rhythm at a rate of 75. Nonspecific ST changes.

## 2011-07-13 NOTE — Patient Instructions (Signed)
Your physician recommends that you schedule a follow-up appointment in: AS NEEDED  Your physician has requested that you have an echocardiogram. Echocardiography is a painless test that uses sound waves to create images of your heart. It provides your doctor with information about the size and shape of your heart and how well your heart's chambers and valves are working. This procedure takes approximately one hour. There are no restrictions for this procedure.   

## 2011-07-13 NOTE — Assessment & Plan Note (Signed)
Management per primary care. 

## 2011-07-22 ENCOUNTER — Ambulatory Visit (HOSPITAL_COMMUNITY): Payer: BC Managed Care – PPO | Attending: Internal Medicine

## 2011-07-22 DIAGNOSIS — I2510 Atherosclerotic heart disease of native coronary artery without angina pectoris: Secondary | ICD-10-CM | POA: Insufficient documentation

## 2011-07-22 DIAGNOSIS — I10 Essential (primary) hypertension: Secondary | ICD-10-CM | POA: Insufficient documentation

## 2011-07-22 DIAGNOSIS — E785 Hyperlipidemia, unspecified: Secondary | ICD-10-CM | POA: Insufficient documentation

## 2011-07-22 DIAGNOSIS — I428 Other cardiomyopathies: Secondary | ICD-10-CM

## 2011-07-22 DIAGNOSIS — E669 Obesity, unspecified: Secondary | ICD-10-CM | POA: Insufficient documentation

## 2011-08-02 ENCOUNTER — Ambulatory Visit (INDEPENDENT_AMBULATORY_CARE_PROVIDER_SITE_OTHER): Payer: BC Managed Care – PPO | Admitting: Internal Medicine

## 2011-08-02 ENCOUNTER — Encounter: Payer: Self-pay | Admitting: Internal Medicine

## 2011-08-02 ENCOUNTER — Other Ambulatory Visit (INDEPENDENT_AMBULATORY_CARE_PROVIDER_SITE_OTHER): Payer: BC Managed Care – PPO

## 2011-08-02 VITALS — BP 140/90 | HR 68 | Temp 98.2°F | Ht 69.0 in | Wt 380.1 lb

## 2011-08-02 DIAGNOSIS — R7302 Impaired glucose tolerance (oral): Secondary | ICD-10-CM

## 2011-08-02 DIAGNOSIS — Z0000 Encounter for general adult medical examination without abnormal findings: Secondary | ICD-10-CM

## 2011-08-02 DIAGNOSIS — R7309 Other abnormal glucose: Secondary | ICD-10-CM

## 2011-08-02 LAB — URINALYSIS, ROUTINE W REFLEX MICROSCOPIC
Bilirubin Urine: NEGATIVE
Hgb urine dipstick: NEGATIVE
Ketones, ur: NEGATIVE
Leukocytes, UA: NEGATIVE
Nitrite: NEGATIVE
Specific Gravity, Urine: 1.015 (ref 1.000–1.030)
Total Protein, Urine: NEGATIVE
Urine Glucose: NEGATIVE
Urobilinogen, UA: 0.2 (ref 0.0–1.0)
pH: 7 (ref 5.0–8.0)

## 2011-08-02 LAB — CBC WITH DIFFERENTIAL/PLATELET
Basophils Absolute: 0 10*3/uL (ref 0.0–0.1)
Basophils Relative: 0.3 % (ref 0.0–3.0)
Eosinophils Absolute: 0.5 10*3/uL (ref 0.0–0.7)
Eosinophils Relative: 5.2 % — ABNORMAL HIGH (ref 0.0–5.0)
HCT: 44.8 % (ref 39.0–52.0)
Hemoglobin: 14.7 g/dL (ref 13.0–17.0)
Lymphocytes Relative: 31.8 % (ref 12.0–46.0)
Lymphs Abs: 2.8 10*3/uL (ref 0.7–4.0)
MCHC: 32.8 g/dL (ref 30.0–36.0)
MCV: 87.1 fl (ref 78.0–100.0)
Monocytes Absolute: 0.7 10*3/uL (ref 0.1–1.0)
Monocytes Relative: 8.4 % (ref 3.0–12.0)
Neutro Abs: 4.8 10*3/uL (ref 1.4–7.7)
Neutrophils Relative %: 54.3 % (ref 43.0–77.0)
Platelets: 237 10*3/uL (ref 150.0–400.0)
RBC: 5.15 Mil/uL (ref 4.22–5.81)
RDW: 15.2 % — ABNORMAL HIGH (ref 11.5–14.6)
WBC: 8.8 10*3/uL (ref 4.5–10.5)

## 2011-08-02 LAB — HEMOGLOBIN A1C: Hgb A1c MFr Bld: 6.7 % — ABNORMAL HIGH (ref 4.6–6.5)

## 2011-08-02 NOTE — Assessment & Plan Note (Signed)
stable overall by hx and exam, most recent data reviewed with pt, and pt to continue medical treatment as before Lab Results  Component Value Date   WBC 4.9 03/12/2010   HGB 14.6 03/12/2010   HCT 43.0 03/12/2010   PLT 174 03/12/2010   GLUCOSE 100* 08/16/2010   CHOL 228* 10/16/2008   TRIG 178.0* 10/16/2008   HDL 28.80* 10/16/2008   LDLDIRECT 164.4 10/16/2008   LDLCALC 133* 05/30/2007   ALT 28 08/13/2009   AST 20 08/13/2009   NA 140 08/16/2010   K 3.9 08/16/2010   CL 104 08/16/2010   CREATININE 1.0 08/16/2010   BUN 12 08/16/2010   CO2 28 08/16/2010   TSH 3.12 10/16/2008

## 2011-08-02 NOTE — Progress Notes (Signed)
Subjective:    Patient ID: Tyler [name], male    DOB: October 17, 1975, 36 y.o.   MRN: [id number]  HPI Here for wellness and f/u;  Overall doing ok;  Pt denies CP, worsening SOB, DOE, wheezing, orthopnea, PND, worsening LE edema, palpitations, dizziness or syncope.  Pt denies neurological change such as new Headache, facial or extremity weakness.  Pt denies polydipsia, polyuria, or low sugar symptoms. Pt states overall good compliance with treatment and medications, good tolerability, and trying to follow lower cholesterol diet.  Pt denies worsening depressive symptoms, suicidal ideation or panic. No fever, wt loss, night sweats, loss of appetite, or other constitutional symptoms.  Pt states good ability with ADL's, low fall risk, home safety reviewed and adequate, no significant changes in hearing or vision, and occasionally active with exercise. Past Medical History  Diagnosis Date  . Hypertension   . CHF (congestive heart failure)   . ACHILLES TENDINITIS, BILATERAL 10/16/2008  . ALLERGIC RHINITIS 08/13/2009  . CARDIOMYOPATHY 04/21/2009  . CONGESTIVE HEART FAILURE 12/04/2007  . CORONARY ARTERY DISEASE 12/04/2007  . HYPERLIPIDEMIA 03/12/2007  . HYPERTENSION 03/12/2007  . OBESITY 04/21/2009  . SLEEP APNEA 03/12/2007   No past surgical history on file.  reports that he has never smoked. He has never used smokeless tobacco. He reports that he does not drink alcohol or use illicit drugs. family history includes Asthma in his brother and Hypertension in his other. No Known Allergies Current Outpatient Prescriptions on File Prior to Visit  Medication Sig Dispense Refill  . albuterol (PROVENTIL,VENTOLIN) 90 MCG/ACT inhaler Inhale 2 puffs into the lungs 3 (three) times daily as needed. For coughing and wheezing        . amLODipine (NORVASC) 5 MG tablet TAKE ONE TABLET BY MOUTH EVERY DAY  90 tablet  3  . aspirin 81 MG tablet Take 81 mg by mouth daily.        . carvedilol (COREG) 25 MG tablet        . FISH  OIL-CHOLECALCIFEROL PO Take 1 capsule by mouth daily.        . furosemide (LASIX) 20 MG tablet Take 40 mg by mouth daily.       . Multiple Vitamins-Minerals (MULTIVITAMIN WITH MINERALS) tablet Take 1 tablet by mouth daily.        Marland Kitchen PRESCRIPTION MEDICATION Apply 1 application topically daily as needed. Dry skin cream for face       . valsartan (DIOVAN) 160 MG tablet Take 160 mg by mouth 2 (two) times daily.         Review of Systems Review of Systems  Constitutional: Negative for diaphoresis, activity change, appetite change and unexpected weight change.  HENT: Negative for hearing loss, ear pain, facial swelling, mouth sores and neck stiffness.   Eyes: Negative for pain, redness and visual disturbance.  Respiratory: Negative for shortness of breath and wheezing.   Cardiovascular: Negative for chest pain and palpitations.  Gastrointestinal: Negative for diarrhea, blood in stool, abdominal distention and rectal pain.  Genitourinary: Negative for hematuria, flank pain and decreased urine volume.  Musculoskeletal: Negative for myalgias and joint swelling.  Skin: Negative for color change and wound.  Neurological: Negative for syncope and numbness.  Hematological: Negative for adenopathy.  Psychiatric/Behavioral: Negative for hallucinations, self-injury, decreased concentration and agitation.      Objective:   Physical Exam BP 140/90  Pulse 68  Temp(Src) 98.2 F (36.8 C) (Oral)  Ht 5\' 9"  (1.753 m)  Wt 380 lb 2 oz (172.424  kg)  BMI 56.13 kg/m2  SpO2 97% Physical Exam  VS noted Constitutional: Pt is oriented to person, place, and time. Appears well-developed and well-nourished.  HENT:  Head: Normocephalic and atraumatic.  Right Ear: External ear normal.  Left Ear: External ear normal.  Nose: Nose normal.  Mouth/Throat: Oropharynx is clear and moist.  Eyes: Conjunctivae and EOM are normal. Pupils are equal, round, and reactive to light.  Neck: Normal range of motion. Neck supple. No  JVD present. No tracheal deviation present.  Cardiovascular: Normal rate, regular rhythm, normal heart sounds and intact distal pulses.   Pulmonary/Chest: Effort normal and breath sounds normal.  Abdominal: Soft. Bowel sounds are normal. There is no tenderness.  Musculoskeletal: Normal range of motion. Exhibits no edema.  Lymphadenopathy:  Has no cervical adenopathy.  Neurological: Pt is alert and oriented to person, place, and time. Pt has normal reflexes. No cranial nerve deficit.  Skin: Skin is warm and dry. No rash noted.  Psychiatric:  Has  normal mood and affect. Behavior is normal.     Assessment & Plan:

## 2011-08-02 NOTE — Patient Instructions (Signed)
Continue all other medications as before Please go to LAB in the Basement for the blood and/or urine tests to be done today Please call the phone number [phone] (the PhoneTree System) for results of testing in 2-3 days;  When calling, simply dial the number, and when prompted enter the MRN number above (the Medical Record Number) and the # key, then the message should start. Please return in 1 year for your yearly visit, or sooner if needed, with Lab testing done 3-5 days before  

## 2011-08-02 NOTE — Assessment & Plan Note (Signed)

## 2011-08-03 LAB — HEPATIC FUNCTION PANEL
ALT: 36 U/L (ref 0–53)
AST: 25 U/L (ref 0–37)
Albumin: 3.9 g/dL (ref 3.5–5.2)
Alkaline Phosphatase: 80 U/L (ref 39–117)
Bilirubin, Direct: 0 mg/dL (ref 0.0–0.3)
Total Bilirubin: 0.4 mg/dL (ref 0.3–1.2)
Total Protein: 7.6 g/dL (ref 6.0–8.3)

## 2011-08-03 LAB — BASIC METABOLIC PANEL
BUN: 13 mg/dL (ref 6–23)
CO2: 29 mEq/L (ref 19–32)
Calcium: 9.2 mg/dL (ref 8.4–10.5)
Chloride: 106 mEq/L (ref 96–112)
Creatinine, Ser: 1 mg/dL (ref 0.4–1.5)
GFR: 112.94 mL/min (ref >60.00)
Glucose, Bld: 106 mg/dL — ABNORMAL HIGH (ref 70–99)
Potassium: 4.1 mEq/L (ref 3.5–5.1)
Sodium: 141 mEq/L (ref 135–145)

## 2011-08-03 LAB — LIPID PANEL
Cholesterol: 235 mg/dL — ABNORMAL HIGH (ref 0–200)
HDL: 38.2 mg/dL — ABNORMAL LOW (ref >39.00)
Total CHOL/HDL Ratio: 6
Triglycerides: 192 mg/dL — ABNORMAL HIGH (ref 0.0–149.0)
VLDL: 38.4 mg/dL (ref 0.0–40.0)

## 2011-08-03 LAB — TSH: TSH: 2.22 u[IU]/mL (ref 0.35–5.50)

## 2011-08-03 LAB — LDL CHOLESTEROL, DIRECT: Direct LDL: 183.6 mg/dL

## 2011-08-05 ENCOUNTER — Other Ambulatory Visit: Payer: Self-pay | Admitting: Internal Medicine

## 2011-08-05 DIAGNOSIS — Z79899 Other long term (current) drug therapy: Secondary | ICD-10-CM

## 2011-08-05 DIAGNOSIS — E785 Hyperlipidemia, unspecified: Secondary | ICD-10-CM

## 2011-08-05 MED ORDER — ATORVASTATIN CALCIUM 20 MG PO TABS: 20.0000 mg | ORAL_TABLET | Freq: Every day | ORAL | Status: DC

## 2011-08-10 ENCOUNTER — Other Ambulatory Visit: Payer: Self-pay | Admitting: Cardiology

## 2011-09-23 ENCOUNTER — Other Ambulatory Visit: Payer: Self-pay

## 2011-09-23 ENCOUNTER — Other Ambulatory Visit: Payer: Self-pay | Admitting: Cardiology

## 2011-09-23 MED ORDER — FUROSEMIDE 20 MG PO TABS: 40.0000 mg | ORAL_TABLET | Freq: Every day | ORAL | Status: DC

## 2011-10-27 ENCOUNTER — Ambulatory Visit: Payer: BC Managed Care – PPO | Admitting: Internal Medicine

## 2011-11-01 ENCOUNTER — Ambulatory Visit (INDEPENDENT_AMBULATORY_CARE_PROVIDER_SITE_OTHER): Payer: BC Managed Care – PPO | Admitting: Internal Medicine

## 2011-11-01 ENCOUNTER — Other Ambulatory Visit (INDEPENDENT_AMBULATORY_CARE_PROVIDER_SITE_OTHER): Payer: BC Managed Care – PPO

## 2011-11-01 ENCOUNTER — Encounter: Payer: Self-pay | Admitting: Internal Medicine

## 2011-11-01 VITALS — BP 122/82 | HR 74 | Temp 98.4°F | Ht 69.0 in | Wt 386.0 lb

## 2011-11-01 DIAGNOSIS — G478 Other sleep disorders: Secondary | ICD-10-CM

## 2011-11-01 DIAGNOSIS — L909 Atrophic disorder of skin, unspecified: Secondary | ICD-10-CM

## 2011-11-01 DIAGNOSIS — E1165 Type 2 diabetes mellitus with hyperglycemia: Secondary | ICD-10-CM | POA: Insufficient documentation

## 2011-11-01 DIAGNOSIS — IMO0001 Reserved for inherently not codable concepts without codable children: Secondary | ICD-10-CM

## 2011-11-01 DIAGNOSIS — M722 Plantar fascial fibromatosis: Secondary | ICD-10-CM

## 2011-11-01 DIAGNOSIS — E669 Obesity, unspecified: Secondary | ICD-10-CM

## 2011-11-01 DIAGNOSIS — E785 Hyperlipidemia, unspecified: Secondary | ICD-10-CM

## 2011-11-01 DIAGNOSIS — R7309 Other abnormal glucose: Secondary | ICD-10-CM

## 2011-11-01 DIAGNOSIS — J309 Allergic rhinitis, unspecified: Secondary | ICD-10-CM

## 2011-11-01 DIAGNOSIS — K219 Gastro-esophageal reflux disease without esophagitis: Secondary | ICD-10-CM

## 2011-11-01 DIAGNOSIS — L918 Other hypertrophic disorders of the skin: Secondary | ICD-10-CM | POA: Insufficient documentation

## 2011-11-01 DIAGNOSIS — E119 Type 2 diabetes mellitus without complications: Secondary | ICD-10-CM | POA: Insufficient documentation

## 2011-11-01 DIAGNOSIS — R7302 Impaired glucose tolerance (oral): Secondary | ICD-10-CM

## 2011-11-01 DIAGNOSIS — Z0000 Encounter for general adult medical examination without abnormal findings: Secondary | ICD-10-CM

## 2011-11-01 DIAGNOSIS — G4730 Sleep apnea, unspecified: Secondary | ICD-10-CM | POA: Insufficient documentation

## 2011-11-01 HISTORY — DX: Plantar fascial fibromatosis: M72.2

## 2011-11-01 HISTORY — DX: Gastro-esophageal reflux disease without esophagitis: K21.9

## 2011-11-01 LAB — URINALYSIS, ROUTINE W REFLEX MICROSCOPIC
Bilirubin Urine: NEGATIVE
Hgb urine dipstick: NEGATIVE
Ketones, ur: NEGATIVE
Leukocytes, UA: NEGATIVE
Nitrite: NEGATIVE
Specific Gravity, Urine: 1.025 (ref 1.000–1.030)
Total Protein, Urine: NEGATIVE
Urine Glucose: NEGATIVE
Urobilinogen, UA: 0.2 (ref 0.0–1.0)
pH: 6 (ref 5.0–8.0)

## 2011-11-01 LAB — CBC WITH DIFFERENTIAL/PLATELET
Basophils Absolute: 0.1 10*3/uL (ref 0.0–0.1)
Basophils Relative: 1.1 % (ref 0.0–3.0)
Eosinophils Absolute: 0.6 10*3/uL (ref 0.0–0.7)
Eosinophils Relative: 7.2 % — ABNORMAL HIGH (ref 0.0–5.0)
HCT: 42 % (ref 39.0–52.0)
Hemoglobin: 13.9 g/dL (ref 13.0–17.0)
Lymphocytes Relative: 27.2 % (ref 12.0–46.0)
Lymphs Abs: 2.4 10*3/uL (ref 0.7–4.0)
MCHC: 33 g/dL (ref 30.0–36.0)
MCV: 86.4 fl (ref 78.0–100.0)
Monocytes Absolute: 0.9 10*3/uL (ref 0.1–1.0)
Monocytes Relative: 10.5 % (ref 3.0–12.0)
Neutro Abs: 4.7 10*3/uL (ref 1.4–7.7)
Neutrophils Relative %: 54 % (ref 43.0–77.0)
Platelets: 233 10*3/uL (ref 150.0–400.0)
RBC: 4.86 Mil/uL (ref 4.22–5.81)
RDW: 14.9 % — ABNORMAL HIGH (ref 11.5–14.6)
WBC: 8.8 10*3/uL (ref 4.5–10.5)

## 2011-11-01 LAB — BASIC METABOLIC PANEL
BUN: 14 mg/dL (ref 6–23)
CO2: 29 mEq/L (ref 19–32)
Calcium: 9 mg/dL (ref 8.4–10.5)
Chloride: 106 mEq/L (ref 96–112)
Creatinine, Ser: 1.3 mg/dL (ref 0.4–1.5)
GFR: 78.1 mL/min (ref >60.00)
Glucose, Bld: 92 mg/dL (ref 70–99)
Potassium: 3.9 mEq/L (ref 3.5–5.1)
Sodium: 142 mEq/L (ref 135–145)

## 2011-11-01 LAB — LIPID PANEL
Cholesterol: 164 mg/dL (ref 0–200)
HDL: 36.1 mg/dL — ABNORMAL LOW (ref >39.00)
LDL Cholesterol: 101 mg/dL — ABNORMAL HIGH (ref 0–99)
Total CHOL/HDL Ratio: 5
Triglycerides: 135 mg/dL (ref 0.0–149.0)
VLDL: 27 mg/dL (ref 0.0–40.0)

## 2011-11-01 LAB — HEPATIC FUNCTION PANEL
ALT: 32 U/L (ref 0–53)
AST: 25 U/L (ref 0–37)
Albumin: 3.7 g/dL (ref 3.5–5.2)
Alkaline Phosphatase: 71 U/L (ref 39–117)
Bilirubin, Direct: 0.1 mg/dL (ref 0.0–0.3)
Total Bilirubin: 0.4 mg/dL (ref 0.3–1.2)
Total Protein: 7.5 g/dL (ref 6.0–8.3)

## 2011-11-01 LAB — TSH: TSH: 2.37 u[IU]/mL (ref 0.35–5.50)

## 2011-11-01 LAB — HEMOGLOBIN A1C: Hgb A1c MFr Bld: 6.5 % (ref 4.6–6.5)

## 2011-11-01 MED ORDER — PANTOPRAZOLE SODIUM 40 MG PO TBEC: 40.0000 mg | DELAYED_RELEASE_TABLET | Freq: Every day | ORAL | Status: DC

## 2011-11-01 MED ORDER — METHYLPREDNISOLONE ACETATE 80 MG/ML IJ SUSP
120.0000 mg | Freq: Once | INTRAMUSCULAR | Status: AC
  Administered 2011-11-01: 120 mg via INTRAMUSCULAR

## 2011-11-01 NOTE — Assessment & Plan Note (Signed)
stable overall by hx and exam, most recent data reviewed with pt, and pt to continue medical treatment as before le Lab Results  Component Value Date   HGBA1C 6.5 11/01/2011

## 2011-11-01 NOTE — Assessment & Plan Note (Signed)
stable overall by hx and exam, most recent data reviewed with pt, and pt to continue medical treatment as before Lab Results  Component Value Date   LDLCALC 101* 11/01/2011

## 2011-11-01 NOTE — Assessment & Plan Note (Signed)
For pulm eval ? Need osa eval

## 2011-11-01 NOTE — Assessment & Plan Note (Signed)
Mild to mod, for PPI, antireflux diet,  to f/u any worsening symptoms or concerns

## 2011-11-01 NOTE — Assessment & Plan Note (Signed)
Mild, for podiatry referral, tylenol prn

## 2011-11-01 NOTE — Assessment & Plan Note (Signed)
Mild to mod seasonal flare, for depomedrol IM,  to f/u any worsening symptoms or concerns 

## 2011-11-01 NOTE — Progress Notes (Signed)
Subjective:    Patient ID: Tyler [name], male    DOB: 1975-12-25, 36 y.o.   MRN: [id number]  HPI Here to f/u; overall doing ok,  Pt denies chest pain, increased sob or doe, wheezing, orthopnea, PND, increased LE swelling, palpitations, dizziness or syncope.  Pt denies new neurological symptoms such as new headache, or facial or extremity weakness or numbness   Pt denies polydipsia, polyuria, or low sugar symptoms such as weakness or confusion improved with po intake.  Pt states overall good compliance with meds, trying to follow lower cholesterol, diabetic diet, wt overall stable but little exercise however.  CBG's in low 100's.  Has mult skin tags to axilla, neck bilat groin area need removed as they get irritated.  Does have several wks ongoing nasal allergy symptoms with clear congestion, itch and sneeze, without fever, pain, ST, cough or wheezing.  Cannot lose wt despite more exercise and better diet, is interested in wt loss surgury.  Has also 2-3 wks bilat heel pain, without swelling, reddness but severe worse to stand/walk.  Has also had 2 mo mild to mod  worsening reflux, without dysphagia, abd pain, n/v, bowel change or blood.  Wife also mentions he seems to stop breathing very often and snores to sleep, he denies daytime somnolence or AM headache, elev BP worsening.   Past Medical History  Diagnosis Date  . Hypertension   . CHF (congestive heart failure)   . ACHILLES TENDINITIS, BILATERAL 10/16/2008  . ALLERGIC RHINITIS 08/13/2009  . CARDIOMYOPATHY 04/21/2009  . CONGESTIVE HEART FAILURE 12/04/2007  . CORONARY ARTERY DISEASE 12/04/2007  . HYPERLIPIDEMIA 03/12/2007  . HYPERTENSION 03/12/2007  . OBESITY 04/21/2009  . SLEEP APNEA 03/12/2007  . Plantar fasciitis, bilateral 11/01/2011  . GERD (gastroesophageal reflux disease) 11/01/2011   No past surgical history on file.  reports that he has never smoked. He has never used smokeless tobacco. He reports that he does not drink alcohol or use illicit  drugs. family history includes Asthma in his brother and Hypertension in his other. No Known Allergies Current Outpatient Prescriptions on File Prior to Visit  Medication Sig Dispense Refill  . albuterol (PROVENTIL,VENTOLIN) 90 MCG/ACT inhaler Inhale 2 puffs into the lungs 3 (three) times daily as needed. For coughing and wheezing        . amLODipine (NORVASC) 5 MG tablet TAKE ONE TABLET BY MOUTH EVERY DAY  90 tablet  3  . aspirin 81 MG tablet Take 81 mg by mouth daily.        Marland Kitchen atorvastatin (LIPITOR) 20 MG tablet Take 1 tablet (20 mg total) by mouth daily.  90 tablet  3  . carvedilol (COREG) 25 MG tablet        . DIOVAN 160 MG tablet TAKE ONE TABLET BY MOUTH TWICE DAILY  60 each  12  . FISH OIL-CHOLECALCIFEROL PO Take 1 capsule by mouth daily.        . furosemide (LASIX) 20 MG tablet Take 2 tablets (40 mg total) by mouth daily.  30 tablet  6  . Multiple Vitamins-Minerals (MULTIVITAMIN WITH MINERALS) tablet Take 1 tablet by mouth daily.        Marland Kitchen PRESCRIPTION MEDICATION Apply 1 application topically daily as needed. Dry skin cream for face       . pantoprazole (PROTONIX) 40 MG tablet Take 1 tablet (40 mg total) by mouth daily.  90 tablet  3   No current facility-administered medications on file prior to visit.   Review of Systems  Review of Systems  Constitutional: Negative for diaphoresis and unexpected weight change.  HENT: Negative for drooling and tinnitus.   Eyes: Negative for photophobia and visual disturbance.  Respiratory: Negative for choking and stridor.   Gastrointestinal: Negative for vomiting and blood in stool.  Genitourinary: Negative for hematuria and decreased urine volume.  Skin: Negative for color change and wound.  Neurological: Negative for tremors and numbness.  Psychiatric/Behavioral: Negative for decreased concentration. The patient is not hyperactive.       Objective:   Physical Exam BP 122/82  Pulse 74  Temp(Src) 98.4 F (36.9 C) (Oral)  Ht 5\' 9"  (1.753  m)  Wt 386 lb (175.088 kg)  BMI 57.00 kg/m2  SpO2 97% Physical Exam  VS noted Constitutional: Pt appears well-developed and well-nourished.  HENT: Head: Normocephalic.  Right Ear: External ear normal.  Left Ear: External ear normal.  Eyes: Conjunctivae and EOM are normal. Pupils are equal, round, and reactive to light.  Neck: Normal range of motion. Neck supple.  Cardiovascular: Normal rate and regular rhythm.   Pulmonary/Chest: Effort normal and breath sounds normal.  Abd:  Soft, NT, non-distended, + BS - benign Neurological: Pt is alert. No cranial nerve deficit.  Bilat heels with marked tender, no ulcer, red, swelling Skin: Skin is warm. No erythema. Mult skin tags to bilat groin, neck, axillas Psychiatric: Pt behavior is normal. Thought content normal.     Assessment & Plan:

## 2011-11-01 NOTE — Assessment & Plan Note (Signed)
Numerous = for derm referral,  to f/u any worsening symptoms or concerns

## 2011-11-01 NOTE — Patient Instructions (Signed)
You had the steroid shot today for the allergies Take all new medications as prescribed - the reflux medication (generic protonix) Continue all other medications as before, including the allegra Please go to LAB in the Basement for the blood and/or urine tests to be done today You will be contacted by phone if any changes need to be made immediately.  Otherwise, you will receive a letter about your results with an explanation. You will be contacted regarding the referral for: Dermatology, Pulmonary (for sleep breathing problem), General Surgury (for wt loss surgury), and Podiatry (for heel pain)

## 2011-11-01 NOTE — Assessment & Plan Note (Signed)
Morbid, may in fact be too large for lap band, but will try referral

## 2011-11-23 ENCOUNTER — Ambulatory Visit (INDEPENDENT_AMBULATORY_CARE_PROVIDER_SITE_OTHER): Payer: BC Managed Care – PPO | Admitting: General Surgery

## 2011-11-23 ENCOUNTER — Encounter (INDEPENDENT_AMBULATORY_CARE_PROVIDER_SITE_OTHER): Payer: Self-pay | Admitting: General Surgery

## 2011-11-23 VITALS — BP 154/84 | HR 84 | Resp 20 | Ht 69.0 in | Wt 377.0 lb

## 2011-11-23 DIAGNOSIS — Z6841 Body Mass Index (BMI) 40.0 and over, adult: Secondary | ICD-10-CM

## 2011-11-23 NOTE — Patient Instructions (Signed)
Call us with your decision of how you would like to proceed.   The support group meets the third Thursday of the month at [employer] at [employer] in [place].

## 2011-11-23 NOTE — Progress Notes (Signed)
Patient ID: Tyler [name], male   DOB: 06-Apr-1976, 36 y.o.   MRN: [id number]  Chief Complaint  Patient presents with  . Weight Loss Surgery    lap band initial    HPI Tyler [name] is a 36 y.o. male.   HPI  36 year old morbidly obese African American male referred by Dr. Jonny [name] for evaluation for weight loss surgery. The patient was sent thru our fast track LapBand pathway. The patient states that he has struggled all his life with his weight. He has tried the [employer] without any long-term success. Despite several attempts for sustained weight loss the patient has been unsuccessful. He is interested in learning more about weight loss surgery. He exercises on occasion which includes walking on a treadmill and doing the elliptical.  Past Medical History  Diagnosis Date  . Hypertension   . CHF (congestive heart failure)   . ACHILLES TENDINITIS, BILATERAL 10/16/2008  . ALLERGIC RHINITIS 08/13/2009  . CARDIOMYOPATHY 04/21/2009  . CONGESTIVE HEART FAILURE 12/04/2007  . CORONARY ARTERY DISEASE 12/04/2007  . HYPERLIPIDEMIA 03/12/2007  . HYPERTENSION 03/12/2007  . OBESITY 04/21/2009  . SLEEP APNEA 03/12/2007  . Plantar fasciitis, bilateral 11/01/2011  . GERD (gastroesophageal reflux disease) 11/01/2011    No past surgical history on file.  Family History  Problem Relation Age of Onset  . Asthma Brother   . Hypertension Other     Social History History  Substance Use Topics  . Smoking status: Never Smoker   . Smokeless tobacco: Never Used  . Alcohol Use: No    No Known Allergies  Current Outpatient Prescriptions  Medication Sig Dispense Refill  . albuterol (PROVENTIL,VENTOLIN) 90 MCG/ACT inhaler Inhale 2 puffs into the lungs 3 (three) times daily as needed. For coughing and wheezing        . amLODipine (NORVASC) 5 MG tablet TAKE ONE TABLET BY MOUTH EVERY DAY  90 tablet  3  . aspirin 81 MG tablet Take 81 mg by mouth daily.        Marland Kitchen atorvastatin (LIPITOR) 20 MG tablet Take 1  tablet (20 mg total) by mouth daily.  90 tablet  3  . carvedilol (COREG) 25 MG tablet        . DIOVAN 160 MG tablet TAKE ONE TABLET BY MOUTH TWICE DAILY  60 each  12  . FISH OIL-CHOLECALCIFEROL PO Take 1 capsule by mouth daily.        . furosemide (LASIX) 20 MG tablet Take 2 tablets (40 mg total) by mouth daily.  30 tablet  6  . Multiple Vitamins-Minerals (MULTIVITAMIN WITH MINERALS) tablet Take 1 tablet by mouth daily.        . pantoprazole (PROTONIX) 40 MG tablet Take 1 tablet (40 mg total) by mouth daily.  90 tablet  3  . PRESCRIPTION MEDICATION Apply 1 application topically daily as needed. Dry skin cream for face         Review of Systems Review of Systems  Constitutional: Negative for activity change and appetite change.       Has had elevated Blood sugars in past, says A1C was 6.5 in past; on no meds for blood sugar  HENT: Negative for nosebleeds, congestion and neck pain.   Eyes: Negative for photophobia and visual disturbance.  Respiratory: Negative for chest tightness and shortness of breath.        Wife reports +snoring, stop breathing during sleep; has repeat Sleep study on 6/12; uses inhaler rarely  Cardiovascular: Negative for chest pain,  palpitations and leg swelling.       Denies CP, SOB, DOE; has seen Dr Tyler [name] in the past  Gastrointestinal: Negative for abdominal pain, diarrhea and constipation.       +heartburn on meds  Genitourinary: Negative for dysuria, urgency, hematuria and difficulty urinating.  Musculoskeletal: Negative for back pain and arthralgias.       Negative DVT on ultrasound from 2011  Skin: Negative for pallor and rash.       Has had some cellulitis on LLE in past  Neurological: Negative for tremors, seizures, facial asymmetry, speech difficulty and weakness.  Psychiatric/Behavioral: Negative for behavioral problems and self-injury. The patient is not nervous/anxious.     Blood pressure 154/84, pulse 84, resp. rate 20, height 5\' 9"  (1.753 m), weight  377 lb (171.006 kg).  Physical Exam Physical Exam  Vitals reviewed. Constitutional: He is oriented to person, place, and time. He appears well-developed and well-nourished. No distress.       Morbidly obese  HENT:  Head: Normocephalic and atraumatic.  Right Ear: External ear normal.  Left Ear: External ear normal.  Eyes: Conjunctivae and EOM are normal. No scleral icterus.  Neck: Normal range of motion. Neck supple. No tracheal deviation present. No thyromegaly present.  Cardiovascular: Normal rate, regular rhythm, normal heart sounds and intact distal pulses.   Pulmonary/Chest: Effort normal and breath sounds normal. No stridor. No respiratory distress. He has no wheezes.  Abdominal: Soft. Bowel sounds are normal. He exhibits no distension. There is no tenderness. There is no rebound.  Musculoskeletal: Normal range of motion. He exhibits no edema and no tenderness.  Neurological: He is alert and oriented to person, place, and time. He exhibits normal muscle tone.  Skin: Skin is warm and dry. No rash noted. He is not diaphoretic. No erythema.  Psychiatric: He has a normal mood and affect. His behavior is normal. Judgment and thought content normal.    Data Reviewed Dr Raphael Gibney note from May 2013 Cardiology note from 04/2008 Echo results 07/2011: EF: 55-60%, LA dilated Labs from 10/2011 - normal bmet, lfts, cbc, tsh, A1C 6.5; LDL 101  Assessment    Morbid obesity BMI 55.67 Hypertension Non-ischemic cardiomyopathy GERD Dyslipidemia Probable OSA Diabetes Mellitus, Type 2 - non-medicated    Plan    The patient meets weight loss surgery criteria and would benefit from weight loss surgery.  We discussed laparoscopic adjustable gastric banding. The patient was given Agricultural engineer. We discussed the risk and benefits of surgery including but not limited to bleeding, infection, injury to surrounding structures, blood clot formation such as deep venous thrombosis or pulmonary  embolism, need to convert to an open procedure, band slippage, band erosion, failure to loose weight, port complications (leak or flippage), potential need for reoperative surgery, esophageal dilatation, worsening reflux, and vitamin deficiencies. We discussed the typical post operative recovery course. We discussed that their postoperative diet will be modified for several weeks. We specifically talked about the need to be on a liquid diet for one to 2 weeks after surgery. We also discussed the typical postoperative course with a laparoscopic adjustable gastric band and the need for frequent postoperative visits to assess the volume status of the band.  We discussed the typical expected weight loss with a laparoscopic adjustable gastric band. I explained to the patient that they can expect to lose 40-60% of their excess body weight if they are compliant with their postoperative instructions. However I did explain that some patients loose less than 40% and  some patients lose more than 60% of their excess body weight.  I explained that the likelihood of improvement in their obesity is good.  We discussed laparoscopic Roux-en-Y gastric bypass. We discussed the preoperative, operative and postoperative process. Using diagrams, I explained the surgery in detail including the performance of an EGD near the end of the surgery and an Upper GI swallow study on POD 1. We discussed the typical hospital course including a 2-3 day stay baring any complications.   The patient was given educational material. I quoted the patient that they can expect to lose 50-70% of their excess weight with the gastric bypass. We did discuss the possibility of weight regain several years after the procedure.  We discussed the risk and benefits of surgery including but not limited to anesthesia risk, bleeding, infection, anastomotic edema requiring a few additional days in the hospital, postop nausea, blood clot formation, anastomotic leak,  anastomotic stricture, ulcer formation, death, respiratory complications, intestinal blockage, internal hernia, gallstone formation, vitamin and nutritional deficiencies, injury to surrounding structures, failure to lose weight and mood changes.  We discussed that before and after surgery that there would be an alteration in their diet. I explained that we have put them on a diet 2 weeks before surgery. I also explained that they would be on a liquid diet for 2 weeks after surgery. We discussed that they would have to avoid certain foods such as sugar after surgery. We discussed the importance of physical activity as well as compliance with our dietary and supplement recommendations and routine follow-up.  We also discussed sleeve gastrectomy along with its risks, benefits, and outcomes.   The patient is unsure if he would like to proceed with surgery. We discussed him attending one of the support group meetings.  I asked him to call us when he has decided on whether or not he would like to proceed with the evaluation process. I explained that he could decide which procedure on down the road but the first decision is to decide whether or not he wants weight loss surgery.   I explained to the patient that we will start our evaluation process (once we hear from him) which includes Upper GI to evaluate stomach and swallowing anatomy, nutritionist consultation, psychiatrist consultation, EKG, CXR, abdominal ultrasound, cardiac and pulmonary clearance.   Mary Sella. Andrey Campanile, MD, FACS General, Bariatric, & Minimally Invasive Surgery [facility], [place]            [facility] M 11/23/2011, 11:39 AM

## 2011-11-30 ENCOUNTER — Institutional Professional Consult (permissible substitution): Payer: BC Managed Care – PPO | Admitting: Pulmonary Disease

## 2011-12-02 ENCOUNTER — Ambulatory Visit (INDEPENDENT_AMBULATORY_CARE_PROVIDER_SITE_OTHER): Payer: Self-pay | Admitting: Surgery

## 2011-12-07 ENCOUNTER — Telehealth (INDEPENDENT_AMBULATORY_CARE_PROVIDER_SITE_OTHER): Payer: Self-pay | Admitting: General Surgery

## 2011-12-07 NOTE — Telephone Encounter (Signed)
Called patient to check on status of his decision on Bariatric surgery. Left message for him to call me back.

## 2011-12-12 ENCOUNTER — Other Ambulatory Visit (HOSPITAL_COMMUNITY): Payer: BC Managed Care – PPO

## 2011-12-26 ENCOUNTER — Institutional Professional Consult (permissible substitution): Payer: BC Managed Care – PPO | Admitting: Pulmonary Disease

## 2011-12-29 ENCOUNTER — Other Ambulatory Visit: Payer: Self-pay | Admitting: Cardiology

## 2011-12-29 NOTE — Telephone Encounter (Signed)
Fax Received. Refill Completed. Cheila Wickstrom Chowoe (R.M.A)   

## 2011-12-31 ENCOUNTER — Ambulatory Visit: Payer: BC Managed Care – PPO | Admitting: *Deleted

## 2012-01-21 ENCOUNTER — Other Ambulatory Visit: Payer: Self-pay | Admitting: Cardiology

## 2012-02-18 ENCOUNTER — Ambulatory Visit (INDEPENDENT_AMBULATORY_CARE_PROVIDER_SITE_OTHER): Payer: BC Managed Care – PPO | Admitting: Internal Medicine

## 2012-02-18 ENCOUNTER — Encounter: Payer: Self-pay | Admitting: Internal Medicine

## 2012-02-18 VITALS — BP 134/86 | HR 88 | Temp 98.6°F | Ht 69.0 in | Wt 365.0 lb

## 2012-02-18 DIAGNOSIS — J069 Acute upper respiratory infection, unspecified: Secondary | ICD-10-CM

## 2012-02-18 MED ORDER — AMOXICILLIN 500 MG PO TABS: 1000.0000 mg | ORAL_TABLET | Freq: Two times a day (BID) | ORAL | Status: AC

## 2012-02-18 MED ORDER — HYDROCODONE-HOMATROPINE 5-1.5 MG/5ML PO SYRP: 5.0000 mL | ORAL_SOLUTION | Freq: Every evening | ORAL | Status: AC | PRN

## 2012-02-18 MED ORDER — KETOCONAZOLE 2 % EX GEL: 1.0000 "application " | Freq: Every day | CUTANEOUS | Status: DC | PRN

## 2012-02-18 NOTE — Assessment & Plan Note (Signed)
Seems to be sinus and bronchial Had post tussive vomiting here in office as well Discussed viral etiology Will treat with hydrocodone cough syrup Rx for amoxil to use next week if seems to have signs of secondary bacterial infection

## 2012-02-18 NOTE — Patient Instructions (Signed)
Please start the amoxicillin antibiotic if you get worse next week, instead of better---or you start with a lot of purulent phlegm

## 2012-02-18 NOTE — Progress Notes (Signed)
Subjective:    Patient ID: Tyler [name], male    DOB: 10-Jul-1975, 36 y.o.   MRN: [id number]  HPI Started with itchy nose and throat Some sneezing Now with some cough Has phlegm--seems stuck in throat  All started yesterday No fever Starting to have some SOB Some sore throat 2 days ago No ear pain  Tried allegra and cough syrup--?slight help  Current Outpatient Prescriptions on File Prior to Visit  Medication Sig Dispense Refill  . albuterol (PROVENTIL,VENTOLIN) 90 MCG/ACT inhaler Inhale 2 puffs into the lungs 3 (three) times daily as needed. For coughing and wheezing        . amLODipine (NORVASC) 5 MG tablet TAKE ONE TABLET BY MOUTH EVERY DAY  30 tablet  12  . aspirin 81 MG tablet Take 81 mg by mouth daily.        Marland Kitchen atorvastatin (LIPITOR) 20 MG tablet Take 1 tablet (20 mg total) by mouth daily.  90 tablet  3  . carvedilol (COREG) 25 MG tablet TAKE TWO TABLETS BY MOUTH TWICE DAILY  120 tablet  5  . DIOVAN 160 MG tablet TAKE ONE TABLET BY MOUTH TWICE DAILY  60 each  12  . fexofenadine (ALLEGRA) 180 MG tablet Take 180 mg by mouth daily.      Marland Kitchen FISH OIL-CHOLECALCIFEROL PO Take 1 capsule by mouth daily.        . furosemide (LASIX) 20 MG tablet Take 2 tablets (40 mg total) by mouth daily.  30 tablet  6  . Multiple Vitamins-Minerals (MULTIVITAMIN WITH MINERALS) tablet Take 1 tablet by mouth daily.        . pantoprazole (PROTONIX) 40 MG tablet Take 1 tablet (40 mg total) by mouth daily.  90 tablet  3  . PRESCRIPTION MEDICATION Apply 1 application topically daily as needed. Dry skin cream for face         No Known Allergies  Past Medical History  Diagnosis Date  . Hypertension   . CHF (congestive heart failure)   . ACHILLES TENDINITIS, BILATERAL 10/16/2008  . ALLERGIC RHINITIS 08/13/2009  . CARDIOMYOPATHY 04/21/2009  . CONGESTIVE HEART FAILURE 12/04/2007  . CORONARY ARTERY DISEASE 12/04/2007  . HYPERLIPIDEMIA 03/12/2007  . HYPERTENSION 03/12/2007  . OBESITY 04/21/2009  . SLEEP  APNEA 03/12/2007  . Plantar fasciitis, bilateral 11/01/2011  . GERD (gastroesophageal reflux disease) 11/01/2011    No past surgical history on file.  Family History  Problem Relation Age of Onset  . Asthma Brother   . Hypertension Other     History   Social History  . Marital Status: Married    Spouse Name: N/A    Number of Children: 2  . Years of Education: N/A   Occupational History  .     Social History Main Topics  . Smoking status: Never Smoker   . Smokeless tobacco: Never Used  . Alcohol Use: No  . Drug Use: No  . Sexually Active: Not on file   Other Topics Concern  . Not on file   Social History Narrative  . No narrative on file   Review of Systems No rash  No vomiting or diarrhea Appetite is okay     Objective:   Physical Exam  Constitutional: He appears well-developed and well-nourished. No distress.       Coarse cough  HENT:  Mouth/Throat: Oropharynx is clear and moist. No oropharyngeal exudate.       No sinus tenderness TMs normal Moderate nasal inflammation and erythema  Neck:  Normal range of motion. Neck supple.       Mild non tender bilateral anterior cervical nodes  Pulmonary/Chest: Effort normal and breath sounds normal. No respiratory distress. He has no wheezes. He has no rales.          Assessment & Plan:

## 2012-03-20 ENCOUNTER — Other Ambulatory Visit: Payer: Self-pay | Admitting: Cardiology

## 2012-06-01 ENCOUNTER — Ambulatory Visit (INDEPENDENT_AMBULATORY_CARE_PROVIDER_SITE_OTHER): Payer: BC Managed Care – PPO | Admitting: Internal Medicine

## 2012-06-01 ENCOUNTER — Encounter: Payer: Self-pay | Admitting: Internal Medicine

## 2012-06-01 ENCOUNTER — Other Ambulatory Visit: Payer: BC Managed Care – PPO

## 2012-06-01 ENCOUNTER — Ambulatory Visit (INDEPENDENT_AMBULATORY_CARE_PROVIDER_SITE_OTHER)
Admission: RE | Admit: 2012-06-01 | Discharge: 2012-06-01 | Disposition: A | Discharge status: Home / Self Care | Payer: BC Managed Care – PPO | Source: Ambulatory Visit | Attending: Internal Medicine | Admitting: Internal Medicine

## 2012-06-01 ENCOUNTER — Other Ambulatory Visit (INDEPENDENT_AMBULATORY_CARE_PROVIDER_SITE_OTHER): Payer: BC Managed Care – PPO

## 2012-06-01 VITALS — BP 132/82 | HR 87 | Temp 99.7°F | Ht 69.0 in | Wt 361.0 lb

## 2012-06-01 DIAGNOSIS — R05 Cough: Secondary | ICD-10-CM | POA: Insufficient documentation

## 2012-06-01 DIAGNOSIS — I10 Essential (primary) hypertension: Secondary | ICD-10-CM

## 2012-06-01 DIAGNOSIS — IMO0001 Reserved for inherently not codable concepts without codable children: Secondary | ICD-10-CM

## 2012-06-01 DIAGNOSIS — R059 Cough, unspecified: Secondary | ICD-10-CM

## 2012-06-01 LAB — LIPID PANEL
Cholesterol: 102 mg/dL (ref 0–200)
HDL: 39.6 mg/dL (ref >39.00)
LDL Cholesterol: 58 mg/dL (ref 0–99)
Total CHOL/HDL Ratio: 3
Triglycerides: 20 mg/dL (ref 0.0–149.0)
VLDL: 4 mg/dL (ref 0.0–40.0)

## 2012-06-01 LAB — CBC WITH DIFFERENTIAL/PLATELET
Basophils Absolute: 0 10*3/uL (ref 0.0–0.1)
Basophils Relative: 0.2 % (ref 0.0–3.0)
Eosinophils Absolute: 0.1 10*3/uL (ref 0.0–0.7)
Eosinophils Relative: 0.4 % (ref 0.0–5.0)
HCT: 42.9 % (ref 39.0–52.0)
Hemoglobin: 14.3 g/dL (ref 13.0–17.0)
Lymphocytes Relative: 7.1 % — ABNORMAL LOW (ref 12.0–46.0)
Lymphs Abs: 1.6 10*3/uL (ref 0.7–4.0)
MCHC: 33.3 g/dL (ref 30.0–36.0)
MCV: 84.8 fl (ref 78.0–100.0)
Monocytes Absolute: 1.9 10*3/uL — ABNORMAL HIGH (ref 0.1–1.0)
Monocytes Relative: 8.3 % (ref 3.0–12.0)
Neutro Abs: 19.2 10*3/uL — ABNORMAL HIGH (ref 1.4–7.7)
Neutrophils Relative %: 84 % — ABNORMAL HIGH (ref 43.0–77.0)
Platelets: 223 10*3/uL (ref 150.0–400.0)
RBC: 5.05 Mil/uL (ref 4.22–5.81)
RDW: 14.5 % (ref 11.5–14.6)
WBC: 22.8 10*3/uL (ref 4.5–10.5)

## 2012-06-01 LAB — BASIC METABOLIC PANEL
BUN: 14 mg/dL (ref 6–23)
CO2: 30 mEq/L (ref 19–32)
Calcium: 8.8 mg/dL (ref 8.4–10.5)
Chloride: 102 mEq/L (ref 96–112)
Creatinine, Ser: 1.3 mg/dL (ref 0.4–1.5)
GFR: 82.16 mL/min (ref >60.00)
Glucose, Bld: 114 mg/dL — ABNORMAL HIGH (ref 70–99)
Potassium: 3.9 mEq/L (ref 3.5–5.1)
Sodium: 137 mEq/L (ref 135–145)

## 2012-06-01 LAB — URINALYSIS, ROUTINE W REFLEX MICROSCOPIC
Bilirubin Urine: NEGATIVE
Ketones, ur: NEGATIVE
Leukocytes, UA: NEGATIVE
Nitrite: NEGATIVE
Specific Gravity, Urine: 1.02 (ref 1.000–1.030)
Total Protein, Urine: NEGATIVE
Urine Glucose: NEGATIVE
Urobilinogen, UA: 1 (ref 0.0–1.0)
pH: 6 (ref 5.0–8.0)

## 2012-06-01 LAB — HEPATIC FUNCTION PANEL
ALT: 33 U/L (ref 0–53)
AST: 21 U/L (ref 0–37)
Albumin: 4 g/dL (ref 3.5–5.2)
Alkaline Phosphatase: 62 U/L (ref 39–117)
Bilirubin, Direct: 0.2 mg/dL (ref 0.0–0.3)
Total Bilirubin: 1 mg/dL (ref 0.3–1.2)
Total Protein: 8 g/dL (ref 6.0–8.3)

## 2012-06-01 LAB — HEMOGLOBIN A1C
Hgb A1c MFr Bld: 6.5 % (ref 4.6–6.5)
Hgb A1c MFr Bld: 6.5 % (ref 4.6–6.5)

## 2012-06-01 MED ORDER — LEVOFLOXACIN 500 MG PO TABS: 500.0000 mg | ORAL_TABLET | Freq: Every day | ORAL | Status: DC

## 2012-06-01 MED ORDER — ONDANSETRON HCL 4 MG PO TABS: 4.0000 mg | ORAL_TABLET | Freq: Three times a day (TID) | ORAL | Status: DC | PRN

## 2012-06-01 MED ORDER — HYDROCODONE-HOMATROPINE 5-1.5 MG/5ML PO SYRP: 5.0000 mL | ORAL_SOLUTION | Freq: Four times a day (QID) | ORAL | Status: DC | PRN

## 2012-06-01 NOTE — Patient Instructions (Addendum)
Take all new medications as prescribed - the antibiotic, cough medicine, and nausea medication if needed Continue all other medications as before Please go to XRAY in the Basement for the x-ray test Please go to LAB in the Basement for the blood and/or urine tests to be done today You will be contacted by phone if any changes need to be made immediately.  Otherwise, you will receive a letter about your results with an explanation, but please check with MyChart first. Thank you for enrolling in MyChart. Please follow the instructions below to securely access your online medical record. MyChart allows you to send messages to your doctor, view your test results, renew your prescriptions, schedule appointments, and more. To Log into MyChart, please go to [email or web address], and your Username is: [email or web address]

## 2012-06-02 ENCOUNTER — Encounter: Payer: Self-pay | Admitting: Internal Medicine

## 2012-06-02 NOTE — Assessment & Plan Note (Signed)
Unclear etioloty but suspect acute pneumonia, for cxr and levaquin/cough med,  to f/u any worsening symptoms or concerns

## 2012-06-02 NOTE — Assessment & Plan Note (Signed)
stable overall by hx and exam, most recent data reviewed with pt, and pt to continue medical treatment as before Lab Results  Component Value Date   HGBA1C 6.5 06/01/2012

## 2012-06-02 NOTE — Assessment & Plan Note (Signed)
stable overall by hx and exam, most recent data reviewed with pt, and pt to continue medical treatment as before BP Readings from Last 3 Encounters:  06/01/12 132/82  02/18/12 134/86  11/23/11 154/84

## 2012-06-02 NOTE — Progress Notes (Signed)
Subjective:    Patient ID: Tyler [name], male    DOB: 12/21/1975, 36 y.o.   MRN: [id number]  HPI  Here with acute onset ST and cough with marked onset general malaise and weakness, but Pt denies chest pain, increased sob or doe, wheezing, orthopnea, PND, increased LE swelling, palpitations, dizziness or syncope.  Pt denies new neurological symptoms such as new headache, or facial or extremity weakness or numbness   Pt denies polydipsia, polyuria, or low sugar symptoms such as weakness or confusion improved with po intake.  Pt states overall good compliance with meds.  No falls.   Past Medical History  Diagnosis Date  . Hypertension   . CHF (congestive heart failure)   . ACHILLES TENDINITIS, BILATERAL 10/16/2008  . ALLERGIC RHINITIS 08/13/2009  . CARDIOMYOPATHY 04/21/2009  . CONGESTIVE HEART FAILURE 12/04/2007  . CORONARY ARTERY DISEASE 12/04/2007  . HYPERLIPIDEMIA 03/12/2007  . HYPERTENSION 03/12/2007  . OBESITY 04/21/2009  . SLEEP APNEA 03/12/2007  . Plantar fasciitis, bilateral 11/01/2011  . GERD (gastroesophageal reflux disease) 11/01/2011   No past surgical history on file.  reports that he has never smoked. He has never used smokeless tobacco. He reports that he does not drink alcohol or use illicit drugs. family history includes Asthma in his brother and Hypertension in his other. No Known Allergies Current Outpatient Prescriptions on File Prior to Visit  Medication Sig Dispense Refill  . albuterol (PROVENTIL,VENTOLIN) 90 MCG/ACT inhaler Inhale 2 puffs into the lungs 3 (three) times daily as needed. For coughing and wheezing        . amLODipine (NORVASC) 5 MG tablet TAKE ONE TABLET BY MOUTH EVERY DAY  30 tablet  12  . aspirin 81 MG tablet Take 81 mg by mouth daily.        Marland Kitchen atorvastatin (LIPITOR) 20 MG tablet Take 1 tablet (20 mg total) by mouth daily.  90 tablet  3  . carvedilol (COREG) 25 MG tablet TAKE TWO TABLETS BY MOUTH TWICE DAILY  120 tablet  5  . DIOVAN 160 MG tablet TAKE ONE  TABLET BY MOUTH TWICE DAILY  60 each  12  . fexofenadine (ALLEGRA) 180 MG tablet Take 180 mg by mouth daily.      Marland Kitchen FISH OIL-CHOLECALCIFEROL PO Take 1 capsule by mouth daily.        . furosemide (LASIX) 20 MG tablet TAKE TWO TABLETS BY MOUTH EVERY DAY  30 tablet  5  . Ketoconazole 2 % GEL Apply 1 application topically daily as needed.  45 g  0  . Multiple Vitamins-Minerals (MULTIVITAMIN WITH MINERALS) tablet Take 1 tablet by mouth daily.        . pantoprazole (PROTONIX) 40 MG tablet Take 1 tablet (40 mg total) by mouth daily.  90 tablet  3  . PRESCRIPTION MEDICATION Apply 1 application topically daily as needed. Dry skin cream for face         Review of Systems  Constitutional: Negative for diaphoresis and unexpected weight change.  HENT: Negative for tinnitus.   Eyes: Negative for photophobia and visual disturbance.  Respiratory: Negative for choking and stridor.   Gastrointestinal: Negative for vomiting and blood in stool.  Genitourinary: Negative for hematuria and decreased urine volume.  Musculoskeletal: Negative for gait problem.  Skin: Negative for color change and wound.  Neurological: Negative for tremors and numbness.  Psychiatric/Behavioral: Negative for decreased concentration. The patient is not hyperactive.       Objective:   Physical Exam BP 132/82  Pulse 87  Temp 99.7 F (37.6 C) (Oral)  Ht 5\' 9"  (1.753 m)  Wt 361 lb (163.749 kg)  BMI 53.31 kg/m2  SpO2 91% Physical Exam  VS noted, mod ill appaering, fatigued, weak, hard to sit up from lying on exam table. Constitutional: Pt appears well-developed and well-nourished. /morbid obese HENT: Head: Normocephalic.  Right Ear: External ear normal.  Left Ear: External ear normal.  Eyes: Conjunctivae and EOM are normal. Pupils are equal, round, and reactive to light.  Neck: Normal range of motion. Neck supple.  Cardiovascular: Normal rate and regular rhythm.   Pulmonary/Chest: Effort normal and breath sounds normal.  - no  rales or wheeze Abd:  Soft, NT, non-distended, + BS Neurological: Pt is alert. Not confused  Skin: Skin is warm. No erythema.  Psychiatric: Pt behavior is normal. Thought content normal.     Assessment & Plan:

## 2012-06-07 LAB — CULTURE, BLOOD (SINGLE): Organism ID, Bacteria: NO GROWTH

## 2012-06-09 ENCOUNTER — Encounter (HOSPITAL_COMMUNITY): Payer: Self-pay | Admitting: Emergency Medicine

## 2012-06-09 ENCOUNTER — Emergency Department (HOSPITAL_COMMUNITY)
Admission: EM | Admit: 2012-06-09 | Discharge: 2012-06-09 | Disposition: A | Discharge status: Home / Self Care | Payer: BC Managed Care – PPO | Attending: Emergency Medicine | Admitting: Emergency Medicine

## 2012-06-09 ENCOUNTER — Emergency Department (HOSPITAL_COMMUNITY): Payer: BC Managed Care – PPO

## 2012-06-09 DIAGNOSIS — E785 Hyperlipidemia, unspecified: Secondary | ICD-10-CM | POA: Insufficient documentation

## 2012-06-09 DIAGNOSIS — Z79899 Other long term (current) drug therapy: Secondary | ICD-10-CM | POA: Insufficient documentation

## 2012-06-09 DIAGNOSIS — R05 Cough: Secondary | ICD-10-CM

## 2012-06-09 DIAGNOSIS — E669 Obesity, unspecified: Secondary | ICD-10-CM | POA: Insufficient documentation

## 2012-06-09 DIAGNOSIS — G4730 Sleep apnea, unspecified: Secondary | ICD-10-CM | POA: Insufficient documentation

## 2012-06-09 DIAGNOSIS — Z7982 Long term (current) use of aspirin: Secondary | ICD-10-CM | POA: Insufficient documentation

## 2012-06-09 DIAGNOSIS — I10 Essential (primary) hypertension: Secondary | ICD-10-CM | POA: Insufficient documentation

## 2012-06-09 DIAGNOSIS — I428 Other cardiomyopathies: Secondary | ICD-10-CM | POA: Insufficient documentation

## 2012-06-09 DIAGNOSIS — Z8739 Personal history of other diseases of the musculoskeletal system and connective tissue: Secondary | ICD-10-CM | POA: Insufficient documentation

## 2012-06-09 DIAGNOSIS — I2510 Atherosclerotic heart disease of native coronary artery without angina pectoris: Secondary | ICD-10-CM | POA: Insufficient documentation

## 2012-06-09 DIAGNOSIS — I509 Heart failure, unspecified: Secondary | ICD-10-CM | POA: Insufficient documentation

## 2012-06-09 DIAGNOSIS — J309 Allergic rhinitis, unspecified: Secondary | ICD-10-CM | POA: Insufficient documentation

## 2012-06-09 DIAGNOSIS — R059 Cough, unspecified: Secondary | ICD-10-CM | POA: Insufficient documentation

## 2012-06-09 DIAGNOSIS — K219 Gastro-esophageal reflux disease without esophagitis: Secondary | ICD-10-CM | POA: Insufficient documentation

## 2012-06-09 MED ORDER — HYDROCOD POLST-CHLORPHEN POLST 10-8 MG/5ML PO LQCR: 5.0000 mL | Freq: Two times a day (BID) | ORAL | Status: DC | PRN

## 2012-06-09 NOTE — ED Provider Notes (Signed)
History     CSN: [id number]  Arrival date & time 06/09/12  2018   First MD Initiated Contact with Patient 06/09/12 2125      Chief Complaint  Patient presents with  . Cough    (Consider location/radiation/quality/duration/timing/severity/associated sxs/prior treatment) HPI Comments: Patient presents with a chief complaint of cough.  He reports that his cough has been present for the past two weeks.  He was seen by his PCP eight days ago and was diagnosed with Pneumonia.  He was then started on a ten day course of Levaquin, which he has been taking as directed.  His last dose is scheduled for tomorrow.  He reports that his symptoms have improved somewhat, but he continues to have a persistent cough.  He states that he has used his Albuterol inhaler today, but does not feel that it has helped.  She has also taken Hycodan for his cough, but does not feel that it has helped.  He states that Tussionex has helped his cough in the past.    Patient is a 36 y.o. male presenting with cough. The history is provided by the patient.  Cough The cough is non-productive. There has been no fever. Pertinent negatives include no chest pain, no chills, no ear pain, no rhinorrhea, no shortness of breath and no wheezing. He is not a smoker. His past medical history does not include COPD or asthma.    Past Medical History  Diagnosis Date  . Hypertension   . CHF (congestive heart failure)   . ACHILLES TENDINITIS, BILATERAL 10/16/2008  . ALLERGIC RHINITIS 08/13/2009  . CARDIOMYOPATHY 04/21/2009  . CONGESTIVE HEART FAILURE 12/04/2007  . CORONARY ARTERY DISEASE 12/04/2007  . HYPERLIPIDEMIA 03/12/2007  . HYPERTENSION 03/12/2007  . OBESITY 04/21/2009  . SLEEP APNEA 03/12/2007  . Plantar fasciitis, bilateral 11/01/2011  . GERD (gastroesophageal reflux disease) 11/01/2011    History reviewed. No pertinent past surgical history.  Family History  Problem Relation Age of Onset  . Asthma Brother   . Hypertension  Other     History  Substance Use Topics  . Smoking status: Never Smoker   . Smokeless tobacco: Never Used  . Alcohol Use: No      Review of Systems  Constitutional: Negative for fever and chills.  HENT: Negative for ear pain, congestion and rhinorrhea.   Respiratory: Positive for cough. Negative for shortness of breath and wheezing.   Cardiovascular: Negative.  Negative for chest pain.  Gastrointestinal: Negative for nausea and vomiting.  All other systems reviewed and are negative.    Allergies  Review of patient's allergies indicates no known allergies.  Home Medications   Current Outpatient Rx  Name  Route  Sig  Dispense  Refill  . ALBUTEROL 90 MCG/ACT IN AERS   Inhalation   Inhale 2 puffs into the lungs 3 (three) times daily as needed. For coughing and wheezing           . AMLODIPINE BESYLATE 5 MG PO TABS      TAKE ONE TABLET BY MOUTH EVERY DAY   30 tablet   12   . ASPIRIN EC 81 MG PO TBEC   Oral   Take 81 mg by mouth daily.         . ATORVASTATIN CALCIUM 20 MG PO TABS   Oral   Take 1 tablet (20 mg total) by mouth daily.   90 tablet   3   . CARVEDILOL 25 MG PO TABS  TAKE TWO TABLETS BY MOUTH TWICE DAILY   120 tablet   5   . DIOVAN 160 MG PO TABS      TAKE ONE TABLET BY MOUTH TWICE DAILY   60 each   12   . FEXOFENADINE HCL 180 MG PO TABS   Oral   Take 180 mg by mouth daily.         Marland Kitchen FISH OIL-CHOLECALCIFEROL PO   Oral   Take 1 capsule by mouth daily.           . FUROSEMIDE 20 MG PO TABS      TAKE TWO TABLETS BY MOUTH EVERY DAY   30 tablet   5   . GUAIFENESIN 100 MG/5ML PO SYRP   Oral   Take 200 mg by mouth 3 (three) times daily as needed. For cough         . HYDROCODONE-HOMATROPINE 5-1.5 MG/5ML PO SYRP   Oral   Take 5 mLs by mouth every 6 (six) hours as needed for cough.   120 mL   1   . LEVOFLOXACIN 500 MG PO TABS   Oral   Take 1 tablet (500 mg total) by mouth daily.   10 tablet   0   .  MULTI-VITAMIN/MINERALS PO TABS   Oral   Take 1 tablet by mouth daily.           Marland Kitchen ONDANSETRON HCL 4 MG PO TABS   Oral   Take 1 tablet (4 mg total) by mouth every 8 (eight) hours as needed for nausea.   40 tablet   0   . PANTOPRAZOLE SODIUM 40 MG PO TBEC   Oral   Take 1 tablet (40 mg total) by mouth daily.   90 tablet   3   . PRESCRIPTION MEDICATION   Topical   Apply 1 application topically daily as needed. Dry skin cream for face          . KETOCONAZOLE 2 % EX GEL   Apply externally   Apply 1 application topically daily as needed.   45 g   0     BP 131/91  Pulse 79  Temp 98 F (36.7 C) (Oral)  Resp 18  Ht 5\' 9"  (1.753 m)  Wt 360 lb (163.295 kg)  BMI 53.16 kg/m2  SpO2 97%  Physical Exam  Nursing note and vitals reviewed. Constitutional: He appears well-developed and well-nourished. No distress.  HENT:  Head: Normocephalic and atraumatic.  Mouth/Throat: Oropharynx is clear and moist.  Neck: Normal range of motion. Neck supple.  Cardiovascular: Normal rate, regular rhythm and normal heart sounds.   Pulmonary/Chest: Effort normal and breath sounds normal. No respiratory distress. He has no wheezes. He has no rales.  Neurological: He is alert.  Skin: Skin is warm and dry. He is not diaphoretic.  Psychiatric: He has a normal mood and affect.    ED Course  Procedures (including critical care time)  Labs Reviewed - No data to display Dg Chest 2 View  06/09/2012  *RADIOLOGY REPORT*  Clinical Data: Cough and recent pneumonia.  CHEST - 2 VIEW  Comparison: 06/01/2012  Findings: Two views of the chest were obtained.  There is improved aeration in the left lung but there are prominent interstitial markings bilaterally.  Findings could represent chronic changes versus mild edema.  Heart size is within normal limits.  Trachea is midline.  No definite pleural effusions.  IMPRESSION:  Improved aeration at the left lung base compared to  the prior examination.  Prominent  interstitial lung markings bilaterally.  Findings could represent chronic changes and/or mild edema.   Original Report Authenticated By: Richarda Overlie, M.D.      No diagnosis found.    MDM  Patient presenting with persistent cough.  CXR shows improved aeration.  No respiratory distress.  Lungs CTAB.  Therefore, feel that patient can be discharged home.  Patient given prescription for Tussionex.        Pascal Lux Lugoff, PA-C 06/10/12 [phone]

## 2012-06-09 NOTE — ED Notes (Signed)
Pt presents to the ed with a cough that began today.  Pt has been dx with pneumonia and will finish his antibiotic regiment tomorrow.

## 2012-06-09 NOTE — ED Notes (Signed)
Patient given discharge instructions, information, prescriptions, and diet order. Patient states that they adequately understand discharge information given and to return to ED if symptoms return or worsen.     

## 2012-06-10 NOTE — ED Provider Notes (Signed)
Medical screening examination/treatment/procedure(s) were performed by non-physician practitioner and as supervising physician I was immediately available for consultation/collaboration.  Riel Hirschman, MD 06/10/12 2107 

## 2012-06-11 ENCOUNTER — Telehealth: Payer: Self-pay | Admitting: Cardiology

## 2012-06-11 NOTE — Telephone Encounter (Signed)
plz return call to pt [phone] regarding pt possible pneumonia. He would like to speak with someone to make sure his heart is OK.

## 2012-06-11 NOTE — Telephone Encounter (Signed)
Called patient back. He states that he has had pneumonia treated with antibiotics and Tussinex cough syrup but is still complaining of lingering cough that is bothering him. He states that he feels that he is still congested but can's cough up the mucous. He states that he had a repeat chest xray that showed his pneumonia was resolved. Advised him to call Dr.John (PCP) concerning the lingering cough.

## 2012-07-27 ENCOUNTER — Ambulatory Visit (INDEPENDENT_AMBULATORY_CARE_PROVIDER_SITE_OTHER): Payer: BC Managed Care – PPO | Admitting: Internal Medicine

## 2012-07-27 ENCOUNTER — Encounter: Payer: Self-pay | Admitting: Internal Medicine

## 2012-07-27 VITALS — BP 124/80 | HR 85 | Temp 98.9°F | Ht 69.0 in | Wt 364.0 lb

## 2012-07-27 DIAGNOSIS — Z0000 Encounter for general adult medical examination without abnormal findings: Secondary | ICD-10-CM

## 2012-07-27 DIAGNOSIS — E785 Hyperlipidemia, unspecified: Secondary | ICD-10-CM

## 2012-07-27 DIAGNOSIS — J019 Acute sinusitis, unspecified: Secondary | ICD-10-CM

## 2012-07-27 DIAGNOSIS — IMO0001 Reserved for inherently not codable concepts without codable children: Secondary | ICD-10-CM

## 2012-07-27 DIAGNOSIS — I10 Essential (primary) hypertension: Secondary | ICD-10-CM

## 2012-07-27 MED ORDER — HYDROCOD POLST-CHLORPHEN POLST 10-8 MG/5ML PO LQCR: 5.0000 mL | Freq: Two times a day (BID) | ORAL | Status: DC | PRN

## 2012-07-27 MED ORDER — AZITHROMYCIN 250 MG PO TABS: ORAL_TABLET | ORAL | Status: DC

## 2012-07-27 NOTE — Assessment & Plan Note (Signed)
stable overall by history and exam, recent data reviewed with pt, and pt to continue medical treatment as before,  to f/u any worsening symptoms or concerns Lab Results  Component Value Date   LDLCALC 58 06/01/2012

## 2012-07-27 NOTE — Assessment & Plan Note (Signed)
stable overall by history and exam, recent data reviewed with pt, and pt to continue medical treatment as before,  to f/u any worsening symptoms or concerns BP Readings from Last 3 Encounters:  07/27/12 124/80  06/09/12 144/83  06/01/12 132/82

## 2012-07-27 NOTE — Assessment & Plan Note (Signed)
stable overall by history and exam, recent data reviewed with pt, and pt to continue medical treatment as before,  to f/u any worsening symptoms or concerns Lab Results  Component Value Date   HGBA1C 6.5 06/01/2012    

## 2012-07-27 NOTE — Progress Notes (Signed)
Subjective:    Patient ID: Tyler [name], male    DOB: June 20, 1976, 37 y.o.   MRN: [id number]  HPI   Here with 2-3 days acute onset fever, facial pain, pressure, headache, general weakness and malaise, and greenish d/c, with mild ST and cough, but pt denies chest pain, wheezing, increased sob or doe, orthopnea, PND, increased LE swelling, palpitations, dizziness or syncope.   Pt denies polydipsia, polyuria, or low sugar symptoms such as weakness or confusion improved with po intake.  Pt states overall good compliance with meds, trying to follow lower cholesterol, diabetic diet, wt overall stable but little exercise however.    Pt denies new neurological symptoms such as new headache, or facial or extremity weakness or numbness Past Medical History  Diagnosis Date  . Hypertension   . CHF (congestive heart failure)   . ACHILLES TENDINITIS, BILATERAL 10/16/2008  . ALLERGIC RHINITIS 08/13/2009  . CARDIOMYOPATHY 04/21/2009  . CONGESTIVE HEART FAILURE 12/04/2007  . CORONARY ARTERY DISEASE 12/04/2007  . HYPERLIPIDEMIA 03/12/2007  . HYPERTENSION 03/12/2007  . OBESITY 04/21/2009  . SLEEP APNEA 03/12/2007  . Plantar fasciitis, bilateral 11/01/2011  . GERD (gastroesophageal reflux disease) 11/01/2011   No past surgical history on file.  reports that he has never smoked. He has never used smokeless tobacco. He reports that he does not drink alcohol or use illicit drugs. family history includes Asthma in his brother and Hypertension in his other. No Known Allergies Current Outpatient Prescriptions on File Prior to Visit  Medication Sig Dispense Refill  . albuterol (PROVENTIL,VENTOLIN) 90 MCG/ACT inhaler Inhale 2 puffs into the lungs 3 (three) times daily as needed. For coughing and wheezing        . amLODipine (NORVASC) 5 MG tablet TAKE ONE TABLET BY MOUTH EVERY DAY  30 tablet  12  . aspirin EC 81 MG tablet Take 81 mg by mouth daily.      Marland Kitchen atorvastatin (LIPITOR) 20 MG tablet Take 1 tablet (20 mg total) by  mouth daily.  90 tablet  3  . carvedilol (COREG) 25 MG tablet TAKE TWO TABLETS BY MOUTH TWICE DAILY  120 tablet  5  . DIOVAN 160 MG tablet TAKE ONE TABLET BY MOUTH TWICE DAILY  60 each  12  . fexofenadine (ALLEGRA) 180 MG tablet Take 180 mg by mouth daily.      Marland Kitchen FISH OIL-CHOLECALCIFEROL PO Take 1 capsule by mouth daily.        . furosemide (LASIX) 20 MG tablet TAKE TWO TABLETS BY MOUTH EVERY DAY  30 tablet  5  . guaifenesin (ROBITUSSIN) 100 MG/5ML syrup Take 200 mg by mouth 3 (three) times daily as needed. For cough      . HYDROcodone-homatropine (HYCODAN) 5-1.5 MG/5ML syrup Take 5 mLs by mouth every 6 (six) hours as needed for cough.  120 mL  1  . Ketoconazole 2 % GEL Apply 1 application topically daily as needed.  45 g  0  . Multiple Vitamins-Minerals (MULTIVITAMIN WITH MINERALS) tablet Take 1 tablet by mouth daily.        . ondansetron (ZOFRAN) 4 MG tablet Take 1 tablet (4 mg total) by mouth every 8 (eight) hours as needed for nausea.  40 tablet  0  . pantoprazole (PROTONIX) 40 MG tablet Take 1 tablet (40 mg total) by mouth daily.  90 tablet  3  . PRESCRIPTION MEDICATION Apply 1 application topically daily as needed. Dry skin cream for face         Review  of Systems  Constitutional: Negative for unexpected weight change, or unusual diaphoresis  HENT: Negative for tinnitus.   Eyes: Negative for photophobia and visual disturbance.  Respiratory: Negative for choking and stridor.   Gastrointestinal: Negative for vomiting and blood in stool.  Genitourinary: Negative for hematuria and decreased urine volume.  Musculoskeletal: Negative for acute joint swelling Skin: Negative for color change and wound.  Neurological: Negative for tremors and numbness other than noted  Psychiatric/Behavioral: Negative for decreased concentration or  hyperactivity.       Objective:   Physical Exam BP 124/80  Pulse 85  Temp 98.9 F (37.2 C) (Oral)  Ht 5\' 9"  (1.753 m)  Wt 364 lb (165.109 kg)  BMI 53.75  kg/m2  SpO2 96% VS noted, mild ill Constitutional: Pt appears well-developed and well-nourished.  HENT: Head: NCAT.  Right Ear: External ear normal.  Left Ear: External ear normal.  Bilat tm's with mild erythema.  Max sinus areas mild tender.  Pharynx with mild erythema, no exudate Eyes: Conjunctivae and EOM are normal. Pupils are equal, round, and reactive to light.  Neck: Normal range of motion. Neck supple.  Cardiovascular: Normal rate and regular rhythm.   Pulmonary/Chest: Effort normal and breath sounds normal.  Neurological: Pt is alert. Not confused  Skin: Skin is warm. No erythema.  Psychiatric: Pt behavior is normal. Thought content normal.     Assessment & Plan:

## 2012-07-27 NOTE — Assessment & Plan Note (Signed)
Mild to mod, for antibx course,  to f/u any worsening symptoms or concerns 

## 2012-07-27 NOTE — Patient Instructions (Addendum)
Please take all new medication as prescribed - the antibiotic, cough medicine You are given the work note Please continue all other medications as before, and refills have been done if requested. Thank you for enrolling in MyChart. Please follow the instructions below to securely access your online medical record. MyChart allows you to send messages to your doctor, view your test results, renew your prescriptions, schedule appointments, and more. Please return in 6 months, or sooner if needed, with Lab testing done 3-5 days before

## 2012-08-02 ENCOUNTER — Encounter: Payer: BC Managed Care – PPO | Admitting: Internal Medicine

## 2012-08-04 ENCOUNTER — Other Ambulatory Visit: Payer: Self-pay

## 2012-08-22 ENCOUNTER — Other Ambulatory Visit: Payer: Self-pay | Admitting: Cardiology

## 2012-08-22 ENCOUNTER — Other Ambulatory Visit: Payer: Self-pay | Admitting: Internal Medicine

## 2012-09-08 ENCOUNTER — Emergency Department (HOSPITAL_COMMUNITY)
Admission: EM | Admit: 2012-09-08 | Discharge: 2012-09-08 | Disposition: A | Discharge status: Home / Self Care | Payer: BC Managed Care – PPO | Attending: Emergency Medicine | Admitting: Emergency Medicine

## 2012-09-08 DIAGNOSIS — R221 Localized swelling, mass and lump, neck: Secondary | ICD-10-CM | POA: Insufficient documentation

## 2012-09-08 DIAGNOSIS — E785 Hyperlipidemia, unspecified: Secondary | ICD-10-CM | POA: Insufficient documentation

## 2012-09-08 DIAGNOSIS — I509 Heart failure, unspecified: Secondary | ICD-10-CM | POA: Insufficient documentation

## 2012-09-08 DIAGNOSIS — K0889 Other specified disorders of teeth and supporting structures: Secondary | ICD-10-CM

## 2012-09-08 DIAGNOSIS — Z8709 Personal history of other diseases of the respiratory system: Secondary | ICD-10-CM | POA: Insufficient documentation

## 2012-09-08 DIAGNOSIS — I2510 Atherosclerotic heart disease of native coronary artery without angina pectoris: Secondary | ICD-10-CM | POA: Insufficient documentation

## 2012-09-08 DIAGNOSIS — I10 Essential (primary) hypertension: Secondary | ICD-10-CM | POA: Insufficient documentation

## 2012-09-08 DIAGNOSIS — K089 Disorder of teeth and supporting structures, unspecified: Secondary | ICD-10-CM | POA: Insufficient documentation

## 2012-09-08 DIAGNOSIS — Z8739 Personal history of other diseases of the musculoskeletal system and connective tissue: Secondary | ICD-10-CM | POA: Insufficient documentation

## 2012-09-08 DIAGNOSIS — E669 Obesity, unspecified: Secondary | ICD-10-CM | POA: Insufficient documentation

## 2012-09-08 DIAGNOSIS — R519 Headache, unspecified: Secondary | ICD-10-CM

## 2012-09-08 DIAGNOSIS — K219 Gastro-esophageal reflux disease without esophagitis: Secondary | ICD-10-CM | POA: Insufficient documentation

## 2012-09-08 DIAGNOSIS — R220 Localized swelling, mass and lump, head: Secondary | ICD-10-CM | POA: Insufficient documentation

## 2012-09-08 DIAGNOSIS — Z8679 Personal history of other diseases of the circulatory system: Secondary | ICD-10-CM | POA: Insufficient documentation

## 2012-09-08 DIAGNOSIS — Z7982 Long term (current) use of aspirin: Secondary | ICD-10-CM | POA: Insufficient documentation

## 2012-09-08 DIAGNOSIS — G4730 Sleep apnea, unspecified: Secondary | ICD-10-CM | POA: Insufficient documentation

## 2012-09-08 DIAGNOSIS — Z79899 Other long term (current) drug therapy: Secondary | ICD-10-CM | POA: Insufficient documentation

## 2012-09-08 MED ORDER — PENICILLIN V POTASSIUM 500 MG PO TABS: 500.0000 mg | ORAL_TABLET | Freq: Four times a day (QID) | ORAL | Status: AC

## 2012-09-08 MED ORDER — OXYCODONE-ACETAMINOPHEN 5-325 MG PO TABS: 1.0000 | ORAL_TABLET | ORAL | Status: DC | PRN

## 2012-09-08 MED ORDER — NAPROXEN 500 MG PO TABS: 500.0000 mg | ORAL_TABLET | Freq: Two times a day (BID) | ORAL | Status: DC

## 2012-09-08 NOTE — ED Provider Notes (Signed)
History    This chart was scribed for non-physician practitioner working with Nelia Shi, MD by Leone Payor, ED Scribe. This patient was seen in room WTR6/WTR6 and the patient's care was started at 1801.   CSN: [id number]  Arrival date & time 09/08/12  1801   First MD Initiated Contact with Patient 09/08/12 1811      No chief complaint on file.    The history is provided by the patient. No language interpreter was used.    Tyler [name] is a 37 y.o. male who presents to the Emergency Department complaining of dental pain to left upper side starting last night. Pain is sharp.  States eating makes the pain worse. Nothing alleviates the pain. States he took OTC pain medication with no relief. He denies facial swelling, nasal congestion, fever.   Pt denies smoking and alcohol use.  Past Medical History  Diagnosis Date  . Hypertension   . CHF (congestive heart failure)   . ACHILLES TENDINITIS, BILATERAL 10/16/2008  . ALLERGIC RHINITIS 08/13/2009  . CARDIOMYOPATHY 04/21/2009  . CONGESTIVE HEART FAILURE 12/04/2007  . CORONARY ARTERY DISEASE 12/04/2007  . HYPERLIPIDEMIA 03/12/2007  . HYPERTENSION 03/12/2007  . OBESITY 04/21/2009  . SLEEP APNEA 03/12/2007  . Plantar fasciitis, bilateral 11/01/2011  . GERD (gastroesophageal reflux disease) 11/01/2011    No past surgical history on file.  Family History  Problem Relation Age of Onset  . Asthma Brother   . Hypertension Other     History  Substance Use Topics  . Smoking status: Never Smoker   . Smokeless tobacco: Never Used  . Alcohol Use: No      Review of Systems  Constitutional: Negative for fever.  HENT: Positive for dental problem. Negative for congestion.     Allergies  Review of patient's allergies indicates no known allergies.  Home Medications   Current Outpatient Rx  Name  Route  Sig  Dispense  Refill  . albuterol (PROVENTIL,VENTOLIN) 90 MCG/ACT inhaler   Inhalation   Inhale 2 puffs into the lungs 3 (three)  times daily as needed. For coughing and wheezing           . amLODipine (NORVASC) 5 MG tablet      TAKE ONE TABLET BY MOUTH EVERY DAY   30 tablet   12   . aspirin EC 81 MG tablet   Oral   Take 81 mg by mouth daily.         Marland Kitchen atorvastatin (LIPITOR) 20 MG tablet      TAKE ONE TABLET BY MOUTH EVERY DAY   90 tablet   3   . azithromycin (ZITHROMAX Z-PAK) 250 MG tablet      Use as directed   6 each   1   . carvedilol (COREG) 25 MG tablet      TAKE TWO TABLETS BY MOUTH TWICE DAILY   120 tablet   5   . chlorpheniramine-HYDROcodone (TUSSIONEX PENNKINETIC ER) 10-8 MG/5ML LQCR   Oral   Take 5 mLs by mouth every 12 (twelve) hours as needed.   140 mL   1   . DIOVAN 160 MG tablet      TAKE ONE TABLET BY MOUTH TWICE DAILY   60 tablet   2   . fexofenadine (ALLEGRA) 180 MG tablet   Oral   Take 180 mg by mouth daily.         Marland Kitchen FISH OIL-CHOLECALCIFEROL PO   Oral   Take 1 capsule by mouth  daily.           . furosemide (LASIX) 20 MG tablet      TAKE TWO TABLETS BY MOUTH EVERY DAY   30 tablet   5   . guaifenesin (ROBITUSSIN) 100 MG/5ML syrup   Oral   Take 200 mg by mouth 3 (three) times daily as needed. For cough         . HYDROcodone-homatropine (HYCODAN) 5-1.5 MG/5ML syrup   Oral   Take 5 mLs by mouth every 6 (six) hours as needed for cough.   120 mL   1   . Ketoconazole 2 % GEL   Apply externally   Apply 1 application topically daily as needed.   45 g   0   . Multiple Vitamins-Minerals (MULTIVITAMIN WITH MINERALS) tablet   Oral   Take 1 tablet by mouth daily.           . ondansetron (ZOFRAN) 4 MG tablet   Oral   Take 1 tablet (4 mg total) by mouth every 8 (eight) hours as needed for nausea.   40 tablet   0   . pantoprazole (PROTONIX) 40 MG tablet   Oral   Take 1 tablet (40 mg total) by mouth daily.   90 tablet   3   . PRESCRIPTION MEDICATION   Topical   Apply 1 application topically daily as needed. Dry skin cream for face             There were no vitals taken for this visit.  Physical Exam  Nursing note and vitals reviewed. Constitutional: He is oriented to person, place, and time. He appears well-developed and well-nourished. No distress.  HENT:  Head: Normocephalic and atraumatic.  Fluid behind left eardrum, otherwise normal.   Left upper first, second, third molars are absent. No swelling of gums and no tenderness to palpation. Carries in the left upper 1st and 2nd premolar.   No tenderness over sinuses.   Eyes: Conjunctivae and EOM are normal.  Neck: Normal range of motion.  Pulmonary/Chest: Effort normal.  Musculoskeletal: Normal range of motion.  Lymphadenopathy:    He has no cervical adenopathy.  Neurological: He is alert and oriented to person, place, and time.  Skin: Skin is warm and dry. No rash noted. He is not diaphoretic.  Psychiatric: He has a normal mood and affect. His behavior is normal.    ED Course  Procedures (including critical care time)  DIAGNOSTIC STUDIES: Oxygen Saturation is 96% on room air, adequate by my interpretation.    COORDINATION OF CARE: 6:25 PM Discussed treatment plan with pt at bedside and pt agreed to plan.    Labs Reviewed - No data to display No results found.   1. Pain, dental   2. Facial pain       MDM  Pt with dental pain, no signs of an abscess at this time. Pain just started over night. Pt does have a dentist. Will call them on Monday. Will do percocet at home for pain, penicillin for possible early infection. Follow up   I personally performed the services described in this documentation, which was scribed in my presence. The recorded information has been reviewed and is accurate.   Lottie Mussel, PA-C 09/08/12 1832

## 2012-09-08 NOTE — ED Notes (Signed)
Pt c/o dental pain to L upper side of mouth since yesterday. Pt states he has a dentist that he goes to. Pt has been taking Motrin, but it is not working anymore. Pt has no acute distress. Pt states he drove himself here.

## 2012-09-10 NOTE — ED Provider Notes (Signed)
Medical screening examination/treatment/procedure(s) were performed by non-physician practitioner and as supervising physician I was immediately available for consultation/collaboration.   Arlyss Weathersby L Jeidy Hoerner, MD 09/10/12 0552 

## 2012-09-16 ENCOUNTER — Other Ambulatory Visit: Payer: Self-pay | Admitting: Cardiology

## 2012-10-13 ENCOUNTER — Other Ambulatory Visit: Payer: Self-pay | Admitting: Cardiology

## 2012-10-29 ENCOUNTER — Telehealth: Payer: Self-pay | Admitting: Internal Medicine

## 2012-10-29 DIAGNOSIS — K068 Other specified disorders of gingiva and edentulous alveolar ridge: Secondary | ICD-10-CM

## 2012-10-29 NOTE — Telephone Encounter (Signed)
Called left message to call back 

## 2012-10-29 NOTE — Telephone Encounter (Signed)
Spoke to Dr Egbert Garibaldi, local oral surgury  Pt with c/o recurring right, then left oozing post oral surgury for over 1 wk, with stopping asa prior to surgury; no hx of bleeding diathesis or von willebrands or ITP or other -   Robin to call pt - ? Hematologic/coagulation problem such as platelets or other -   First step is cbc and INR - I will order, ok for pt to do tomorrow

## 2012-10-30 NOTE — Telephone Encounter (Signed)
Patient called back and requested a call back with detailed message of MD instructions.  I did call the patient informed of MD instructions

## 2012-10-30 NOTE — Telephone Encounter (Signed)
Called left message to call back on home and cell number

## 2012-10-31 ENCOUNTER — Other Ambulatory Visit (INDEPENDENT_AMBULATORY_CARE_PROVIDER_SITE_OTHER): Payer: BC Managed Care – PPO

## 2012-10-31 ENCOUNTER — Other Ambulatory Visit: Payer: Self-pay | Admitting: Internal Medicine

## 2012-10-31 DIAGNOSIS — K068 Other specified disorders of gingiva and edentulous alveolar ridge: Secondary | ICD-10-CM

## 2012-10-31 DIAGNOSIS — R791 Abnormal coagulation profile: Secondary | ICD-10-CM

## 2012-10-31 DIAGNOSIS — K055 Other periodontal diseases: Secondary | ICD-10-CM

## 2012-10-31 LAB — CBC WITH DIFFERENTIAL/PLATELET
Basophils Absolute: 0.1 10*3/uL (ref 0.0–0.1)
Basophils Relative: 0.6 % (ref 0.0–3.0)
Eosinophils Absolute: 0.3 10*3/uL (ref 0.0–0.7)
Eosinophils Relative: 3.5 % (ref 0.0–5.0)
HCT: 39.3 % (ref 39.0–52.0)
Hemoglobin: 13.4 g/dL (ref 13.0–17.0)
Lymphocytes Relative: 29.1 % (ref 12.0–46.0)
Lymphs Abs: 2.4 10*3/uL (ref 0.7–4.0)
MCHC: 33.9 g/dL (ref 30.0–36.0)
MCV: 84.7 fl (ref 78.0–100.0)
Monocytes Absolute: 0.7 10*3/uL (ref 0.1–1.0)
Monocytes Relative: 8.9 % (ref 3.0–12.0)
Neutro Abs: 4.8 10*3/uL (ref 1.4–7.7)
Neutrophils Relative %: 57.9 % (ref 43.0–77.0)
Platelets: 225 10*3/uL (ref 150.0–400.0)
RBC: 4.65 Mil/uL (ref 4.22–5.81)
RDW: 15.3 % — ABNORMAL HIGH (ref 11.5–14.6)
WBC: 8.2 10*3/uL (ref 4.5–10.5)

## 2012-10-31 LAB — APTT: aPTT: 32.2 s — ABNORMAL HIGH (ref 21.7–28.8)

## 2012-10-31 LAB — PROTIME-INR
INR: 1.1 ratio — ABNORMAL HIGH (ref 0.8–1.0)
Prothrombin Time: 12 s (ref 10.2–12.4)

## 2012-11-01 ENCOUNTER — Telehealth: Payer: Self-pay | Admitting: Oncology

## 2012-11-01 NOTE — Telephone Encounter (Signed)
LVOM FOR PT TO RETURN CALL IN RE NP APPT.  °

## 2012-11-06 ENCOUNTER — Other Ambulatory Visit: Payer: Self-pay | Admitting: Cardiology

## 2012-11-06 ENCOUNTER — Other Ambulatory Visit: Payer: Self-pay | Admitting: Internal Medicine

## 2012-11-27 ENCOUNTER — Encounter: Payer: Self-pay | Admitting: Internal Medicine

## 2012-11-27 ENCOUNTER — Ambulatory Visit (INDEPENDENT_AMBULATORY_CARE_PROVIDER_SITE_OTHER)
Admission: RE | Admit: 2012-11-27 | Discharge: 2012-11-27 | Disposition: A | Discharge status: Home / Self Care | Payer: BC Managed Care – PPO | Source: Ambulatory Visit | Attending: Internal Medicine | Admitting: Internal Medicine

## 2012-11-27 ENCOUNTER — Ambulatory Visit (INDEPENDENT_AMBULATORY_CARE_PROVIDER_SITE_OTHER): Payer: BC Managed Care – PPO | Admitting: Internal Medicine

## 2012-11-27 VITALS — BP 120/72 | HR 81 | Temp 100.5°F | Ht 69.0 in | Wt 375.5 lb

## 2012-11-27 DIAGNOSIS — R062 Wheezing: Secondary | ICD-10-CM

## 2012-11-27 DIAGNOSIS — J209 Acute bronchitis, unspecified: Secondary | ICD-10-CM

## 2012-11-27 DIAGNOSIS — I10 Essential (primary) hypertension: Secondary | ICD-10-CM

## 2012-11-27 DIAGNOSIS — R110 Nausea: Secondary | ICD-10-CM

## 2012-11-27 MED ORDER — HYDROCODONE-HOMATROPINE 5-1.5 MG/5ML PO SYRP: 5.0000 mL | ORAL_SOLUTION | Freq: Four times a day (QID) | ORAL | Status: DC | PRN

## 2012-11-27 MED ORDER — PREDNISONE 10 MG PO TABS: ORAL_TABLET | ORAL | Status: DC

## 2012-11-27 MED ORDER — ONDANSETRON HCL 4 MG PO TABS: 4.0000 mg | ORAL_TABLET | Freq: Three times a day (TID) | ORAL | Status: DC | PRN

## 2012-11-27 MED ORDER — LEVOFLOXACIN 250 MG PO TABS: 250.0000 mg | ORAL_TABLET | Freq: Every day | ORAL | Status: DC

## 2012-11-27 MED ORDER — METHYLPREDNISOLONE ACETATE 80 MG/ML IJ SUSP
80.0000 mg | Freq: Once | INTRAMUSCULAR | Status: AC
  Administered 2012-11-27: 80 mg via INTRAMUSCULAR

## 2012-11-27 NOTE — Patient Instructions (Addendum)
You had the steroid shot today Please take all new medication as prescribed - the antibiotic, cough medicine, nausea medication, and prednisone Please continue all other medications as before, and refills have been done if requested. You are given the work note today Please have the pharmacy call with any other refills you may need.  Please go to the XRAY Department in the Basement (go straight as you get off the elevator) for the x-ray testing  You will be contacted by phone if any changes need to be made immediately.  Otherwise, you will receive a letter about your results with an explanation, but please check with MyChart first.  Thank you for enrolling in MyChart. Please follow the instructions below to securely access your online medical record. MyChart allows you to send messages to your doctor, view your test results, renew your prescriptions, schedule appointments, and more.  Please return in August 2014, or sooner if needed, with Lab testing done 3-5 days before

## 2012-11-28 NOTE — Assessment & Plan Note (Signed)
stable overall by history and exam, recent data reviewed with pt, and pt to continue medical treatment as before,  to f/u any worsening symptoms or concerns BP Readings from Last 3 Encounters:  11/27/12 120/72  09/08/12 145/76  07/27/12 124/80

## 2012-11-28 NOTE — Assessment & Plan Note (Signed)
Ok for antiemetic prn,  to f/u any worsening symptoms or concerns

## 2012-11-28 NOTE — Assessment & Plan Note (Addendum)
Mild to mod, for antibx course,  to f/u any worsening symptoms or concerns, for cxr r/o pna 

## 2012-11-28 NOTE — Progress Notes (Signed)
Subjective:    Patient ID: Tyler [name], male    DOB: [birth year], 37 y.o.   MRN: [id number]  HPI  Here with acute onset mild to mod 2-3 days ST, HA, general weakness and malaise, with prod cough greenish sputum, but Pt denies chest pain, increased sob or doe, wheezing, orthopnea, PND, increased LE swelling, palpitations, dizziness or syncope, except for onset wheezing last PM, with mild sob, hard to take deep breaths.  Remains morbid obese, hard to lose wt.  Also with mild intemittent nausea, no vomiting, and Denies worsening reflux, abd pain, dysphagia, n/v, bowel change or blood.   Pt denies polydipsia, polyuria.  Pt denies new neurological symptoms such as new headache, or facial or extremity weakness or numbness Past Medical History  Diagnosis Date  . Hypertension   . CHF (congestive heart failure)   . ACHILLES TENDINITIS, BILATERAL 10/16/2008  . ALLERGIC RHINITIS 08/13/2009  . CARDIOMYOPATHY 04/21/2009  . CONGESTIVE HEART FAILURE 12/04/2007  . CORONARY ARTERY DISEASE 12/04/2007  . HYPERLIPIDEMIA 03/12/2007  . HYPERTENSION 03/12/2007  . OBESITY 04/21/2009  . SLEEP APNEA 03/12/2007  . Plantar fasciitis, bilateral 11/01/2011  . GERD (gastroesophageal reflux disease) 11/01/2011   No past surgical history on file.  reports that he has never smoked. He has never used smokeless tobacco. He reports that he does not drink alcohol or use illicit drugs. family history includes Asthma in his brother and Hypertension in his other. No Known Allergies Current Outpatient Prescriptions on File Prior to Visit  Medication Sig Dispense Refill  . albuterol (PROVENTIL,VENTOLIN) 90 MCG/ACT inhaler Inhale 2 puffs into the lungs 3 (three) times daily as needed. For coughing and wheezing        . amLODipine (NORVASC) 5 MG tablet TAKE ONE TABLET BY MOUTH EVERY DAY  30 tablet  12  . aspirin EC 81 MG tablet Take 81 mg by mouth daily.      Marland Kitchen atorvastatin (LIPITOR) 20 MG tablet TAKE ONE TABLET BY MOUTH EVERY DAY  90  tablet  3  . azithromycin (ZITHROMAX Z-PAK) 250 MG tablet Use as directed  6 each  1  . carvedilol (COREG) 25 MG tablet TAKE TWO TABLETS BY MOUTH TWICE DAILY  120 tablet  5  . chlorpheniramine-HYDROcodone (TUSSIONEX PENNKINETIC ER) 10-8 MG/5ML LQCR Take 5 mLs by mouth every 12 (twelve) hours as needed.  140 mL  1  . DIOVAN 160 MG tablet TAKE ONE TABLET BY MOUTH TWICE DAILY  60 tablet  2  . fexofenadine (ALLEGRA) 180 MG tablet Take 180 mg by mouth daily.      Marland Kitchen FISH OIL-CHOLECALCIFEROL PO Take 1 capsule by mouth daily.        . furosemide (LASIX) 20 MG tablet TAKE TWO TABLETS BY MOUTH ONCE DAILY  30 tablet  0  . furosemide (LASIX) 20 MG tablet TAKE TWO TABLETS BY MOUTH ONCE DAILY  60 tablet  2  . guaifenesin (ROBITUSSIN) 100 MG/5ML syrup Take 200 mg by mouth 3 (three) times daily as needed. For cough      . Ketoconazole 2 % GEL Apply 1 application topically daily as needed.  45 g  0  . Multiple Vitamins-Minerals (MULTIVITAMIN WITH MINERALS) tablet Take 1 tablet by mouth daily.        . naproxen (NAPROSYN) 500 MG tablet Take 1 tablet (500 mg total) by mouth 2 (two) times daily.  30 tablet  0  . oxyCODONE-acetaminophen (PERCOCET) 5-325 MG per tablet Take 1 tablet by mouth every 4 (four)  hours as needed for pain.  15 tablet  0  . pantoprazole (PROTONIX) 40 MG tablet TAKE ONE TABLET BY MOUTH EVERY DAY  90 tablet  3  . PRESCRIPTION MEDICATION Apply 1 application topically daily as needed. Dry skin cream for face        No current facility-administered medications on file prior to visit.   ,Review of Systems  Constitutional: Negative for unexpected weight change, or unusual diaphoresis  HENT: Negative for tinnitus.   Eyes: Negative for photophobia and visual disturbance.  Respiratory: Negative for choking and stridor.   Gastrointestinal: Negative for vomiting and blood in stool.  Genitourinary: Negative for hematuria and decreased urine volume.  Musculoskeletal: Negative for acute joint  swelling Skin: Negative for color change and wound.  Neurological: Negative for tremors and numbness other than noted  Psychiatric/Behavioral: Negative for decreased concentration or  hyperactivity.       Objective:   Physical Exam BP 120/72  Pulse 81  Temp(Src) 100.5 F (38.1 C) (Oral)  Ht 5\' 9"  (1.753 m)  Wt 375 lb 8 oz (170.326 kg)  BMI 55.43 kg/m2  SpO2 95% VS noted, mild ill Constitutional: Pt appears morbid obese  HENT: Head: NCAT.  Right Ear: External ear normal.  Left Ear: External ear normal.  Bilat tm's with mild erythema.  Max sinus areas mild tender.  Pharynx with mild erythema, no exudate Eyes: Conjunctivae and EOM are normal. Pupils are equal, round, and reactive to light.  Neck: Normal range of motion. Neck supple.  Cardiovascular: Normal rate and regular rhythm.   Pulmonary/Chest: Effort normal and breath sounds decreased bilat, few wheezes  Neurological: Pt is alert. Not confused  Skin: Skin is warm. No erythema.  Psychiatric: Pt behavior is normal. Thought content normal.      Assessment & Plan:

## 2012-11-28 NOTE — Assessment & Plan Note (Signed)
Mild to mod, for depomedrol IM, predpack asd,,  to f/u any worsening symptoms or concerns

## 2013-01-18 ENCOUNTER — Telehealth: Payer: Self-pay | Admitting: Oncology

## 2013-01-18 NOTE — Telephone Encounter (Signed)
PT WIFE CALLED TO SCHEDULED NP APPT 08/21 @ 10:30 W/DR.SHADAD REFERRING DR. Oliver Barre DX- BLEEDING; ABN PTT WELCOME PACKET MAILED.

## 2013-01-18 NOTE — Telephone Encounter (Signed)
C/D 01/18/13 for appt. 02/08/13

## 2013-01-26 ENCOUNTER — Other Ambulatory Visit: Payer: Self-pay | Admitting: Cardiology

## 2013-02-01 ENCOUNTER — Other Ambulatory Visit: Payer: Self-pay | Admitting: Oncology

## 2013-02-01 DIAGNOSIS — R58 Hemorrhage, not elsewhere classified: Secondary | ICD-10-CM

## 2013-02-08 ENCOUNTER — Telehealth: Payer: Self-pay | Admitting: Oncology

## 2013-02-08 ENCOUNTER — Other Ambulatory Visit (HOSPITAL_BASED_OUTPATIENT_CLINIC_OR_DEPARTMENT_OTHER): Payer: BC Managed Care – PPO | Admitting: Lab

## 2013-02-08 ENCOUNTER — Ambulatory Visit (HOSPITAL_BASED_OUTPATIENT_CLINIC_OR_DEPARTMENT_OTHER): Payer: BC Managed Care – PPO | Admitting: Oncology

## 2013-02-08 ENCOUNTER — Encounter: Payer: Self-pay | Admitting: Oncology

## 2013-02-08 ENCOUNTER — Ambulatory Visit: Payer: BC Managed Care – PPO

## 2013-02-08 VITALS — BP 169/89 | HR 59 | Temp 98.1°F | Resp 18 | Ht 69.0 in | Wt 388.8 lb

## 2013-02-08 DIAGNOSIS — R58 Hemorrhage, not elsewhere classified: Secondary | ICD-10-CM

## 2013-02-08 LAB — COMPREHENSIVE METABOLIC PANEL (CC13)
ALT: 33 U/L (ref 0–55)
AST: 21 U/L (ref 5–34)
Albumin: 3.5 g/dL (ref 3.5–5.0)
Alkaline Phosphatase: 102 U/L (ref 40–150)
BUN: 12.3 mg/dL (ref 7.0–26.0)
CO2: 26 mEq/L (ref 22–29)
Calcium: 8.6 mg/dL (ref 8.4–10.4)
Chloride: 108 mEq/L (ref 98–109)
Creatinine: 1 mg/dL (ref 0.7–1.3)
Glucose: 82 mg/dl (ref 70–140)
Potassium: 3.5 mEq/L (ref 3.5–5.1)
Sodium: 143 mEq/L (ref 136–145)
Total Bilirubin: 0.3 mg/dL (ref 0.20–1.20)
Total Protein: 7.5 g/dL (ref 6.4–8.3)

## 2013-02-08 LAB — CBC WITH DIFFERENTIAL/PLATELET
BASO%: 0.7 % (ref 0.0–2.0)
Basophils Absolute: 0 10*3/uL (ref 0.0–0.1)
EOS%: 3.6 % (ref 0.0–7.0)
Eosinophils Absolute: 0.3 10*3/uL (ref 0.0–0.5)
HCT: 42.2 % (ref 38.4–49.9)
HGB: 14.1 g/dL (ref 13.0–17.1)
LYMPH%: 29.5 % (ref 14.0–49.0)
MCH: 28.8 pg (ref 27.2–33.4)
MCHC: 33.3 g/dL (ref 32.0–36.0)
MCV: 86.3 fL (ref 79.3–98.0)
MONO#: 0.8 10*3/uL (ref 0.1–0.9)
MONO%: 11.8 % (ref 0.0–14.0)
NEUT#: 3.9 10*3/uL (ref 1.5–6.5)
NEUT%: 54.4 % (ref 39.0–75.0)
Platelets: 208 10*3/uL (ref 140–400)
RBC: 4.9 10*6/uL (ref 4.20–5.82)
RDW: 14.5 % (ref 11.0–14.6)
WBC: 7.1 10*3/uL (ref 4.0–10.3)
lymph#: 2.1 10*3/uL (ref 0.9–3.3)

## 2013-02-08 LAB — PROTHROMBIN TIME
INR: 0.93 (ref <1.50)
Prothrombin Time: 12.3 seconds (ref 11.6–15.2)

## 2013-02-08 LAB — APTT: aPTT: 31.6 seconds (ref 24–37)

## 2013-02-08 NOTE — Progress Notes (Signed)
Reason for Referral: Bleeding disorder evaluation.   HPI: [name] is a pleasant 37 year old gentleman referred to me for evaluation of a possible bleeding disorder. He is a gentleman with a history of congestive heart failure and obesity. He developed a dental abscess and had dental extraction in May of 2014 and despite stopping the aspirin he developed excessive bleeding. He reported that he did well few days after the dental procedure and about 5-7 days later had excessive bleeding that he had to be reevaluated by his dentist and had to have a suture put him to stop the bleeding. That also happened previously 2 years ago and he had to have another procedure to stop the bleeding also around 5 days after his procedure. He reported previous teeth extractions in the past without any bleeding complications. He never really have any surgeries in the past that he had catheterization for his heart without any complications. He did report prior to those dental procedures excessive use of Advil and Motrin but no aspirin. He never really had any excessive bleeding previously or easy bruisability. Did not have extra bleeding after shaving or accidental skin cuts.   Clinically, [name] now is asymptomatic. He remains in reasonable good health without any recent illnesses or hospitalizations. He has cardiomyopathy that is well compensated and managed with medications. His continued to work and perform most activities of daily living without any hindrance or decline. Has not reported any weight loss or change in his performance status. Has not reported any constitutional symptoms. Has not reported any GI or GU bleeding or easy bruisability or skin changes.   Past Medical History  Diagnosis Date  . Hypertension   . CHF (congestive heart failure)   . ACHILLES TENDINITIS, BILATERAL 10/16/2008  . ALLERGIC RHINITIS 08/13/2009  . CARDIOMYOPATHY 04/21/2009  . CONGESTIVE HEART FAILURE 12/04/2007  . CORONARY ARTERY  DISEASE 12/04/2007  . HYPERLIPIDEMIA 03/12/2007  . HYPERTENSION 03/12/2007  . OBESITY 04/21/2009  . SLEEP APNEA 03/12/2007  . Plantar fasciitis, bilateral 11/01/2011  . GERD (gastroesophageal reflux disease) 11/01/2011  :  No past surgical history on file.:  Current Outpatient Prescriptions  Medication Sig Dispense Refill  . albuterol (PROVENTIL,VENTOLIN) 90 MCG/ACT inhaler Inhale 2 puffs into the lungs 3 (three) times daily as needed. For coughing and wheezing        . amLODipine (NORVASC) 5 MG tablet TAKE ONE TABLET BY MOUTH EVERY DAY  30 tablet  0  . aspirin EC 81 MG tablet Take 81 mg by mouth daily.      Marland Kitchen atorvastatin (LIPITOR) 20 MG tablet TAKE ONE TABLET BY MOUTH EVERY DAY  90 tablet  3  . carvedilol (COREG) 25 MG tablet TAKE TWO TABLETS BY MOUTH TWICE DAILY  120 tablet  0  . chlorpheniramine-HYDROcodone (TUSSIONEX PENNKINETIC ER) 10-8 MG/5ML LQCR Take 5 mLs by mouth every 12 (twelve) hours as needed.  140 mL  1  . DIOVAN 160 MG tablet TAKE ONE TABLET BY MOUTH TWICE DAILY  60 tablet  2  . fexofenadine (ALLEGRA) 180 MG tablet Take 180 mg by mouth daily.      Marland Kitchen FISH OIL-CHOLECALCIFEROL PO Take 1 capsule by mouth daily.        . furosemide (LASIX) 20 MG tablet TAKE TWO TABLETS BY MOUTH ONCE DAILY  60 tablet  2  . guaifenesin (ROBITUSSIN) 100 MG/5ML syrup Take 200 mg by mouth 3 (three) times daily as needed. For cough      . HYDROcodone-homatropine (HYCODAN) 5-1.5 MG/5ML  syrup Take 5 mLs by mouth every 6 (six) hours as needed for cough.  120 mL  1  . Ketoconazole 2 % GEL Apply 1 application topically daily as needed.  45 g  0  . Multiple Vitamins-Minerals (MULTIVITAMIN WITH MINERALS) tablet Take 1 tablet by mouth daily.        . naproxen (NAPROSYN) 500 MG tablet Take 1 tablet (500 mg total) by mouth 2 (two) times daily.  30 tablet  0  . ondansetron (ZOFRAN) 4 MG tablet Take 1 tablet (4 mg total) by mouth every 8 (eight) hours as needed for nausea.  40 tablet  0  . pantoprazole (PROTONIX) 40  MG tablet TAKE ONE TABLET BY MOUTH EVERY DAY  90 tablet  3  . predniSONE (DELTASONE) 10 MG tablet 2 tabs per day for 7 days  18 tablet  0  . PRESCRIPTION MEDICATION Apply 1 application topically daily as needed. Dry skin cream for face       . oxyCODONE-acetaminophen (PERCOCET) 5-325 MG per tablet Take 1 tablet by mouth every 4 (four) hours as needed for pain.  15 tablet  0   No current facility-administered medications for this visit.       No Known Allergies:  Family History  Problem Relation Age of Onset  . Asthma Brother   . Hypertension Other   :  History   Social History  . Marital Status: Married    Spouse Name: N/A    Number of Children: 2  . Years of Education: N/A   Occupational History  .     Social History Main Topics  . Smoking status: Never Smoker   . Smokeless tobacco: Never Used  . Alcohol Use: No  . Drug Use: No  . Sexual Activity: Not on file   Other Topics Concern  . Not on file   Social History Narrative  . No narrative on file  :  A comprehensive review of systems was negative.  Exam: ECOG 0 Blood pressure 169/89, pulse 59, temperature 98.1 F (36.7 C), temperature source Oral, resp. rate 18, height 5\' 9"  (1.753 m), weight 388 lb 12.8 oz (176.359 kg). General appearance: alert and cooperative Head: Normocephalic, without obvious abnormality, atraumatic Nose: Nares normal. Septum midline. Mucosa normal. No drainage or sinus tenderness. Throat: lips, mucosa, and tongue normal; teeth and gums normal Neck: no adenopathy, no carotid bruit, no JVD, supple, symmetrical, trachea midline and thyroid not enlarged, symmetric, no tenderness/mass/nodules Resp: clear to auscultation bilaterally Chest wall: no tenderness Cardio: regular rate and rhythm, S1, S2 normal, no murmur, click, rub or gallop GI: soft, non-tender; bowel sounds normal; no masses,  no organomegaly Extremities: extremities normal, atraumatic, no cyanosis or edema Skin: Skin color,  texture, turgor normal. No rashes or lesions or no evidence of bleeding or bruising and no lesions noted   Recent Labs  02/08/13 1047  WBC 7.1  HGB 14.1  HCT 42.2  PLT 208    Recent Labs  02/08/13 1047  NA 143  K 3.5  CO2 26  GLUCOSE 82  BUN 12.3  CREATININE 1.0  CALCIUM 8.6      Assessment and Plan:   37 year old gentleman with 2 separate episodes of bleeding after dental procedure but without any personal history of excessive bleeding or easy bruisability in the past. He did have an elevated PTT on 10/31/2012 but on repeat today his PTT is normal at 31.6 with the upper limit of normal is 37 seconds. The differential diagnosis  discussed today with [name] includes inherited versus acquired bleeding disorder. Conditions such as hemophilia, von Willebrand's disease, and other factor deficiencies are considered but less likely given the fact that his history does not suggest that and his PTT has normalized. It is very possible that he could have had an acquired mild factor deficiency due to medications such as nonsteroidal anti-inflammatories or other medications leading up to those procedures. In any case, I will work him up for a bleeding disorder and he will return in followup in about 4-6 weeks to discuss these results. I will do my final recommendation at this time which most likely will indicate more of an acquired condition due to medication rather than inherited bleeding disorder.

## 2013-02-08 NOTE — Telephone Encounter (Signed)
gave pt appt for lMd only on 03/22/13

## 2013-02-08 NOTE — Progress Notes (Signed)
Checked in new pt with no financial concerns. °

## 2013-03-04 ENCOUNTER — Other Ambulatory Visit: Payer: Self-pay | Admitting: Cardiology

## 2013-03-18 ENCOUNTER — Telehealth: Payer: Self-pay | Admitting: Cardiology

## 2013-03-18 NOTE — Telephone Encounter (Signed)
Pt c/o bilateral edema both legs last week with increased SOB. Leg edema and sob better now because he elevated his legs over the weekend. 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 Sodium foods to avoid reviewed since he was eating more fast foods/ advised frequent foot pump exercises while sitting a lot of hours.  Pt wants to f/u with Dr Jens Som to have a check up. Pt was told he may not be able to be seen this week but may be set for a regular f/u app in 4-6 weeks since he is not acute. Pt was told Dr/nurse will call back this week.  Pt agreed to plan.

## 2013-03-18 NOTE — Telephone Encounter (Signed)
Pt calling re having swelling in both legs last week and wants a fu with crenshaw this week, sweeking now gone , denies chest pain , pressure, SOB, except when taking a flight of stairs

## 2013-03-19 ENCOUNTER — Ambulatory Visit (INDEPENDENT_AMBULATORY_CARE_PROVIDER_SITE_OTHER): Payer: BC Managed Care – PPO | Admitting: Internal Medicine

## 2013-03-19 ENCOUNTER — Encounter: Payer: Self-pay | Admitting: Internal Medicine

## 2013-03-19 VITALS — BP 134/82 | HR 66 | Temp 98.6°F | Ht 69.0 in | Wt 393.4 lb

## 2013-03-19 DIAGNOSIS — Z0000 Encounter for general adult medical examination without abnormal findings: Secondary | ICD-10-CM

## 2013-03-19 DIAGNOSIS — R0989 Other specified symptoms and signs involving the circulatory and respiratory systems: Secondary | ICD-10-CM

## 2013-03-19 DIAGNOSIS — R0609 Other forms of dyspnea: Secondary | ICD-10-CM

## 2013-03-19 DIAGNOSIS — J309 Allergic rhinitis, unspecified: Secondary | ICD-10-CM

## 2013-03-19 DIAGNOSIS — R0600 Dyspnea, unspecified: Secondary | ICD-10-CM

## 2013-03-19 DIAGNOSIS — J45909 Unspecified asthma, uncomplicated: Secondary | ICD-10-CM | POA: Insufficient documentation

## 2013-03-19 DIAGNOSIS — I10 Essential (primary) hypertension: Secondary | ICD-10-CM

## 2013-03-19 MED ORDER — ALBUTEROL SULFATE HFA 108 (90 BASE) MCG/ACT IN AERS: 2.0000 | INHALATION_SPRAY | Freq: Four times a day (QID) | RESPIRATORY_TRACT | Status: DC | PRN

## 2013-03-19 MED ORDER — BECLOMETHASONE DIPROPIONATE 80 MCG/ACT IN AERS: 1.0000 | INHALATION_SPRAY | RESPIRATORY_TRACT | Status: DC | PRN

## 2013-03-19 MED ORDER — PREDNISONE 10 MG PO TABS: ORAL_TABLET | ORAL | Status: DC

## 2013-03-19 MED ORDER — METHYLPREDNISOLONE ACETATE 80 MG/ML IJ SUSP
80.0000 mg | Freq: Once | INTRAMUSCULAR | Status: AC
  Administered 2013-03-19: 80 mg via INTRAMUSCULAR

## 2013-03-19 NOTE — Assessment & Plan Note (Addendum)
ECG reviewed as per emr - no change, sinus o/w no acute;  Most likely related to allergic type bronchospasm; for depomedrol IM, short low dose predpack, and add qvar at least seasonally to help prevent worsening in the future.  Will hold on PFT's, cxr for now. Declines pulm referral  Note:  Total time for pt hx, exam, review of record with pt in the room, determination of diagnoses and plan for further eval and tx is > 40 min, with over 50% spent in coordination and counseling of patient

## 2013-03-19 NOTE — Progress Notes (Signed)
Subjective:    Patient ID: Tyler [name], male    DOB: 09/03/1975, 37 y.o.   MRN: [id number]  HPI Here to f/u, Does have several wks ongoing nasal allergy symptoms with clearish congestion, itch and sneezing, without fever, pain, ST, cough, swelling or wheezing, except for seasonal chest wheezing when the allergy symptoms worsen x 2 wks, mild with mild sob, happening now seasonally for the last several yrs, this yr now the worst.  Proventil helps but using more and doesn't seem to last.  ? Restrictive lung dz on June 2014 xray.  ECG today without acute.  Last echo jan 2013 with EF 55% Past Medical History  Diagnosis Date  . Hypertension   . CHF (congestive heart failure)   . ACHILLES TENDINITIS, BILATERAL 10/16/2008  . ALLERGIC RHINITIS 08/13/2009  . CARDIOMYOPATHY 04/21/2009  . CONGESTIVE HEART FAILURE 12/04/2007  . CORONARY ARTERY DISEASE 12/04/2007  . HYPERLIPIDEMIA 03/12/2007  . HYPERTENSION 03/12/2007  . OBESITY 04/21/2009  . SLEEP APNEA 03/12/2007  . Plantar fasciitis, bilateral 11/01/2011  . GERD (gastroesophageal reflux disease) 11/01/2011   No past surgical history on file.  reports that he has never smoked. He has never used smokeless tobacco. He reports that he does not drink alcohol or use illicit drugs. family history includes Asthma in his brother; Hypertension in his other. No Known Allergies Current Outpatient Prescriptions on File Prior to Visit  Medication Sig Dispense Refill  . amLODipine (NORVASC) 5 MG tablet TAKE ONE TABLET BY MOUTH ONCE DAILY  90 tablet  0  . aspirin EC 81 MG tablet Take 81 mg by mouth daily.      Marland Kitchen atorvastatin (LIPITOR) 20 MG tablet TAKE ONE TABLET BY MOUTH EVERY DAY  90 tablet  3  . carvedilol (COREG) 25 MG tablet TAKE TWO TABLETS BY MOUTH TWICE DAILY  360 tablet  0  . chlorpheniramine-HYDROcodone (TUSSIONEX PENNKINETIC ER) 10-8 MG/5ML LQCR Take 5 mLs by mouth every 12 (twelve) hours as needed.  140 mL  1  . DIOVAN 160 MG tablet TAKE ONE TABLET BY  MOUTH TWICE DAILY  180 tablet  0  . fexofenadine (ALLEGRA) 180 MG tablet Take 180 mg by mouth daily.      Marland Kitchen FISH OIL-CHOLECALCIFEROL PO Take 1 capsule by mouth daily.        . furosemide (LASIX) 20 MG tablet TAKE TWO TABLETS BY MOUTH ONCE DAILY  180 tablet  0  . guaifenesin (ROBITUSSIN) 100 MG/5ML syrup Take 200 mg by mouth 3 (three) times daily as needed. For cough      . HYDROcodone-homatropine (HYCODAN) 5-1.5 MG/5ML syrup Take 5 mLs by mouth every 6 (six) hours as needed for cough.  120 mL  1  . Ketoconazole 2 % GEL Apply 1 application topically daily as needed.  45 g  0  . Multiple Vitamins-Minerals (MULTIVITAMIN WITH MINERALS) tablet Take 1 tablet by mouth daily.        . naproxen (NAPROSYN) 500 MG tablet Take 1 tablet (500 mg total) by mouth 2 (two) times daily.  30 tablet  0  . ondansetron (ZOFRAN) 4 MG tablet Take 1 tablet (4 mg total) by mouth every 8 (eight) hours as needed for nausea.  40 tablet  0  . oxyCODONE-acetaminophen (PERCOCET) 5-325 MG per tablet Take 1 tablet by mouth every 4 (four) hours as needed for pain.  15 tablet  0  . pantoprazole (PROTONIX) 40 MG tablet TAKE ONE TABLET BY MOUTH EVERY DAY  90 tablet  3  .  PRESCRIPTION MEDICATION Apply 1 application topically daily as needed. Dry skin cream for face        No current facility-administered medications on file prior to visit.   Review of Systems  Constitutional: Negative for unexpected weight change, or unusual diaphoresis  HENT: Negative for tinnitus.   Eyes: Negative for photophobia and visual disturbance.  Respiratory: Negative for choking and stridor.   Gastrointestinal: Negative for vomiting and blood in stool.  Genitourinary: Negative for hematuria and decreased urine volume.  Musculoskeletal: Negative for acute joint swelling Skin: Negative for color change and wound.  Neurological: Negative for tremors and numbness other than noted  Psychiatric/Behavioral: Negative for decreased concentration or   hyperactivity.       Objective:   Physical Exam BP 134/82  Pulse 66  Temp(Src) 98.6 F (37 C) (Oral)  Ht 5\' 9"  (1.753 m)  Wt 393 lb 6 oz (178.434 kg)  BMI 58.06 kg/m2  SpO2 96% VS noted,  Constitutional: Pt appears well-developed and well-nourished.  HENT: Head: NCAT.  Right Ear: External ear normal.  Left Ear: External ear normal.  Eyes: Conjunctivae and EOM are normal. Pupils are equal, round, and reactive to light.  Neck: Normal range of motion. Neck supple.  Cardiovascular: Normal rate and regular rhythm.   Pulmonary/Chest: Effort normal and breath sounds decresaed bilat, no frank wheeze or rales, has just used proventil inhaler.  Neurological: Pt is alert. Not confused  Skin: Skin is warm. No erythema.  Psychiatric: Pt behavior is normal. Thought content normal.     Assessment & Plan:

## 2013-03-19 NOTE — Assessment & Plan Note (Signed)
stable overall by history and exam, recent data reviewed with pt, and pt to continue medical treatment as before,  to f/u any worsening symptoms or concerns BP Readings from Last 3 Encounters:  03/19/13 134/82  02/08/13 169/89  11/27/12 120/72

## 2013-03-19 NOTE — Assessment & Plan Note (Signed)
See above

## 2013-03-19 NOTE — Assessment & Plan Note (Signed)
Also to improve with steroid tx today, gave sample nasonex with instructions, to call for generic flonase if would like to continue, and to try otc allegra prn as well

## 2013-03-19 NOTE — Patient Instructions (Addendum)
You had the steroid shot today Please take all new medication as prescribed  - the lower dose prednisone (just a short course) Please also try the Qvar inhaler at 1 puff twice per day, as this is main treatment for the chest wheezing and asthma-like problem.  You can also use your Albuterol Inhaler for more fast acting quick relief if you need as well.  Remember to rinse your mouth after every Qvar use to avoid thrush developing in the mouth Please take all new medication as prescribed - the nasonex sample at 2 spray per side per day (and you can call for prescription of generic Flonase if this helps) You can also use the OTC Allegra for allergies if you dont like the nasal sprays, but it may not work as well for the congestion Please continue all other medications as before, Your EKG was OK today No further lab work or xray is needed today Please keep your appointments with your specialists as you have planned  Please remember to sign up for My Chart if you have not done so, as this will be important to you in the future with finding out test results, communicating by private email, and scheduling acute appointments online when needed.  Please return in 3 months, or sooner if needed, with Lab testing done 3-5 days before

## 2013-03-22 ENCOUNTER — Ambulatory Visit: Payer: BC Managed Care – PPO | Admitting: Oncology

## 2013-03-22 NOTE — Telephone Encounter (Signed)
Left message for pt to call.

## 2013-04-01 NOTE — Telephone Encounter (Signed)
Pt has been seen by dr Jonny Ruiz for his dyspnea

## 2013-04-25 ENCOUNTER — Other Ambulatory Visit: Payer: Self-pay

## 2013-04-27 ENCOUNTER — Ambulatory Visit (INDEPENDENT_AMBULATORY_CARE_PROVIDER_SITE_OTHER): Payer: BC Managed Care – PPO | Admitting: Family Medicine

## 2013-04-27 ENCOUNTER — Encounter: Payer: Self-pay | Admitting: Family Medicine

## 2013-04-27 VITALS — BP 142/88 | HR 72 | Temp 97.8°F | Wt 388.0 lb

## 2013-04-27 DIAGNOSIS — Z23 Encounter for immunization: Secondary | ICD-10-CM

## 2013-04-27 DIAGNOSIS — J45909 Unspecified asthma, uncomplicated: Secondary | ICD-10-CM

## 2013-04-27 DIAGNOSIS — J069 Acute upper respiratory infection, unspecified: Secondary | ICD-10-CM | POA: Insufficient documentation

## 2013-04-27 DIAGNOSIS — I428 Other cardiomyopathies: Secondary | ICD-10-CM

## 2013-04-27 MED ORDER — METHYLPREDNISOLONE ACETATE 80 MG/ML IJ SUSP: 80.0000 mg | Freq: Once | INTRAMUSCULAR | Status: DC

## 2013-04-27 MED ORDER — AZITHROMYCIN 250 MG PO TABS: ORAL_TABLET | ORAL | Status: DC

## 2013-04-27 MED ORDER — METHYLPREDNISOLONE ACETATE 80 MG/ML IJ SUSP
80.0000 mg | Freq: Once | INTRAMUSCULAR | Status: AC
  Administered 2013-04-27: 80 mg via INTRAMUSCULAR

## 2013-04-27 NOTE — Progress Notes (Signed)
OFFICE NOTE  04/27/2013  CC:  Chief Complaint  Patient presents with  . Cough     HPI: Patient is a 37 y.o. African-American male who is here for cough. Three-four day hx of nasal congestion, PND, cough of some phlegm in throat.  "Chest feels fine", although he says he gets winded climbing a flight of stairs, which he says is not usual for him.  No fever, no new LE swelling.  Has had to use inhaler a few times recently. No chest pain.  No HA or face pain. Last need for systemic steroids was approx 3-4 mo ago per pt.  Pertinent PMH:  Past Medical History  Diagnosis Date  . Hypertension   . CHF (congestive heart failure)   . ACHILLES TENDINITIS, BILATERAL 10/16/2008  . ALLERGIC RHINITIS 08/13/2009  . CARDIOMYOPATHY 04/21/2009  . CONGESTIVE HEART FAILURE 12/04/2007  . CORONARY ARTERY DISEASE 12/04/2007  . HYPERLIPIDEMIA 03/12/2007  . HYPERTENSION 03/12/2007  . OBESITY 04/21/2009  . SLEEP APNEA 03/12/2007  . Plantar fasciitis, bilateral 11/01/2011  . GERD (gastroesophageal reflux disease) 11/01/2011    MEDS: **Not taking prednisone, oxycodone, or hycodan currently. Outpatient Prescriptions Prior to Visit  Medication Sig Dispense Refill  . albuterol (PROVENTIL HFA;VENTOLIN HFA) 108 (90 BASE) MCG/ACT inhaler Inhale 2 puffs into the lungs every 6 (six) hours as needed for wheezing.  1 Inhaler  11  . amLODipine (NORVASC) 5 MG tablet TAKE ONE TABLET BY MOUTH ONCE DAILY  90 tablet  0  . aspirin EC 81 MG tablet Take 81 mg by mouth daily.      Marland Kitchen atorvastatin (LIPITOR) 20 MG tablet TAKE ONE TABLET BY MOUTH EVERY DAY  90 tablet  3  . beclomethasone (QVAR) 80 MCG/ACT inhaler Inhale 1 puff into the lungs as needed.  1 Inhaler  12  . carvedilol (COREG) 25 MG tablet TAKE TWO TABLETS BY MOUTH TWICE DAILY  360 tablet  0  . chlorpheniramine-HYDROcodone (TUSSIONEX PENNKINETIC ER) 10-8 MG/5ML LQCR Take 5 mLs by mouth every 12 (twelve) hours as needed.  140 mL  1  . DIOVAN 160 MG tablet TAKE ONE TABLET BY  MOUTH TWICE DAILY  180 tablet  0  . fexofenadine (ALLEGRA) 180 MG tablet Take 180 mg by mouth daily.      Marland Kitchen FISH OIL-CHOLECALCIFEROL PO Take 1 capsule by mouth daily.        . furosemide (LASIX) 20 MG tablet TAKE TWO TABLETS BY MOUTH ONCE DAILY  180 tablet  0  . Multiple Vitamins-Minerals (MULTIVITAMIN WITH MINERALS) tablet Take 1 tablet by mouth daily.        . naproxen (NAPROSYN) 500 MG tablet Take 1 tablet (500 mg total) by mouth 2 (two) times daily.  30 tablet  0  . pantoprazole (PROTONIX) 40 MG tablet TAKE ONE TABLET BY MOUTH EVERY DAY  90 tablet  3  . guaifenesin (ROBITUSSIN) 100 MG/5ML syrup Take 200 mg by mouth 3 (three) times daily as needed. For cough      . HYDROcodone-homatropine (HYCODAN) 5-1.5 MG/5ML syrup Take 5 mLs by mouth every 6 (six) hours as needed for cough.  120 mL  1  . Ketoconazole 2 % GEL Apply 1 application topically daily as needed.  45 g  0  . ondansetron (ZOFRAN) 4 MG tablet Take 1 tablet (4 mg total) by mouth every 8 (eight) hours as needed for nausea.  40 tablet  0  . oxyCODONE-acetaminophen (PERCOCET) 5-325 MG per tablet Take 1 tablet by mouth every 4 (  four) hours as needed for pain.  15 tablet  0  . predniSONE (DELTASONE) 10 MG tablet 2 tabs per day for 5 days  10 tablet  0  . PRESCRIPTION MEDICATION Apply 1 application topically daily as needed. Dry skin cream for face        No facility-administered medications prior to visit.    PE: Blood pressure 142/88, pulse 72, temperature 97.8 F (36.6 C), temperature source Oral, weight 388 lb (175.996 kg), SpO2 97.00%. VS: noted--normal. Gen: alert, NAD, NONTOXIC APPEARING, morbidly obese-appearing. HEENT: eyes without injection, drainage, or swelling.  Ears: EACs clear, TMs with normal light reflex and landmarks.  Nose: Clear rhinorrhea, with some dried, crusty exudate adherent to mildly injected mucosa.  No purulent d/c.  No paranasal sinus TTP.  No facial swelling.  Throat and mouth without focal lesion.  No  pharyngial swelling, erythema, or exudate.   Neck: supple, no LAD.   LUNGS: CTA bilat, nonlabored resps.   BS somewhat distant, compatible with body habitus. CV: RRR, no m/r/g. EXT: no clubbing or cyanosis.  He has 1+ pitting edema in both LE's but mostly has a "doughy" feel to his LE subQ tissues. SKIN: no rash  LAB: none  IMPRESSION AND PLAN:  URI, likely viral. Also with some mild asthmatic bronchitis sx's. Depo-medrol IM 80mg  given today. Discussed rx'ing a z-pack to "hold" and if URI sx's not improving in a few days then he can fill this. Mucinex DM or plain mucinex use was encouraged. Flu vaccine given IM today.  FOLLOW UP: prn

## 2013-04-29 ENCOUNTER — Telehealth: Payer: Self-pay | Admitting: *Deleted

## 2013-04-29 NOTE — Telephone Encounter (Signed)
[facility] Triage Call Report Triage Record Num: [id number] Operator: Patriciaann Clan Patient Name: Tyler [name]. Call Date & Time: 04/27/2013 10:27:18AM Patient Phone: [phone] PCP: Oliver Barre Patient Gender: Male PCP Fax : [phone] Patient DOB: 24-Nov-1975 [facility] Name: [facility] Reason for Call: Caller: Jim Like; PCP: Oliver Barre (Adults only); CB#: [phone]; Call regarding Cough/Congestion; Patient states he developed cough, congestion. Onset 04/24/13. Patient states he is expectorating brown to clear mucous. Patient states he is taking Mucinex with improvement. Afebrile. Patient states he feels short of breath with exertion. Denies extremity swelling. No chest pain. Triage per Cough and Breathing Problems Protocol. No emergent sx identified. Disposition of " See Provider within 24 hours" obtained related to positive triage assessment for " New or worsening cough and known cardiac or respiratory condition" and " New or increasing production of yellow, green, or brown sputum." Care advice given per guidelines. Patient advised increased fluids, inhaled steam. Call back parameters reviewed. Appt. scheduled for 04/27/13 1130 with Dr. Earley Favor. Protocol(s) Used: Cough - Adult Protocol(s) Used: Upper Respiratory Infection (URI) Recommended Outcome per Protocol: See Provider within 24 hours Reason for Outcome: Cough is the symptom causing most concern New or worsening cough AND known cardiac or respiratory condition Care Advice: ~ IMMEDIATE ACTION 04/27/2013 10:46:59AM Page 1 of 1 [id number]

## 2013-06-24 ENCOUNTER — Other Ambulatory Visit: Payer: Self-pay | Admitting: Cardiology

## 2013-06-28 ENCOUNTER — Encounter: Payer: Self-pay | Admitting: Internal Medicine

## 2013-06-28 ENCOUNTER — Ambulatory Visit (INDEPENDENT_AMBULATORY_CARE_PROVIDER_SITE_OTHER): Payer: BC Managed Care – PPO | Admitting: Internal Medicine

## 2013-06-28 VITALS — BP 130/84 | HR 68 | Temp 99.1°F | Wt 385.0 lb

## 2013-06-28 DIAGNOSIS — I10 Essential (primary) hypertension: Secondary | ICD-10-CM

## 2013-06-28 DIAGNOSIS — E1165 Type 2 diabetes mellitus with hyperglycemia: Secondary | ICD-10-CM

## 2013-06-28 DIAGNOSIS — J209 Acute bronchitis, unspecified: Secondary | ICD-10-CM

## 2013-06-28 DIAGNOSIS — IMO0001 Reserved for inherently not codable concepts without codable children: Secondary | ICD-10-CM

## 2013-06-28 DIAGNOSIS — J45909 Unspecified asthma, uncomplicated: Secondary | ICD-10-CM

## 2013-06-28 DIAGNOSIS — G4710 Hypersomnia, unspecified: Secondary | ICD-10-CM

## 2013-06-28 MED ORDER — PREDNISONE 10 MG PO TABS: ORAL_TABLET | ORAL | Status: DC

## 2013-06-28 MED ORDER — AZITHROMYCIN 250 MG PO TABS: ORAL_TABLET | ORAL | Status: DC

## 2013-06-28 MED ORDER — OSELTAMIVIR PHOSPHATE 75 MG PO CAPS: 75.0000 mg | ORAL_CAPSULE | Freq: Two times a day (BID) | ORAL | Status: DC

## 2013-06-28 MED ORDER — HYDROCOD POLST-CHLORPHEN POLST 10-8 MG/5ML PO LQCR: 5.0000 mL | Freq: Two times a day (BID) | ORAL | Status: DC | PRN

## 2013-06-28 NOTE — Progress Notes (Signed)
Pre visit review using our clinic review tool, if applicable. No additional management support is needed unless otherwise documented below in the visit note. 

## 2013-06-28 NOTE — Patient Instructions (Signed)
Please take all new medication as prescribed Please continue all other medications as before, and refills have been done if requested.  You are given the work note today  Please remember to sign up for My Chart if you have not done so, as this will be important to you in the future with finding out test results, communicating by private email, and scheduling acute appointments online when needed.

## 2013-06-30 DIAGNOSIS — J209 Acute bronchitis, unspecified: Secondary | ICD-10-CM | POA: Insufficient documentation

## 2013-06-30 NOTE — Assessment & Plan Note (Signed)
stable overall by history and exam, recent data reviewed with pt, and pt to continue medical treatment as before,  to f/u any worsening symptoms or concerns Lab Results  Component Value Date   HGBA1C 6.5 06/01/2012

## 2013-06-30 NOTE — Assessment & Plan Note (Signed)
stable overall by history and exam, recent data reviewed with pt, and pt to continue medical treatment as before,  to f/u any worsening symptoms or concerns SpO2 Readings from Last 3 Encounters:  04/27/13 97%  03/19/13 96%  11/27/12 95%

## 2013-06-30 NOTE — Progress Notes (Signed)
Subjective:    Patient ID: Tyler [name], male    DOB: 11-28-1975, 38 y.o.   MRN: [id number]  HPI Here with acute onset mild to mod 2-3 days ST, HA, general weakness and malaise, with prod cough greenish sputum, but Pt denies chest pain, increased sob or doe, wheezing, orthopnea, PND, increased LE swelling, palpitations, dizziness or syncope., but also with signficant malase, general weakness.  Pt denies new neurological symptoms such as new headache, or facial or extremity weakness or numbness  Pt denies polydipsia, polyuria,  Past Medical History  Diagnosis Date  . Hypertension   . CHF (congestive heart failure)   . ACHILLES TENDINITIS, BILATERAL 10/16/2008  . ALLERGIC RHINITIS 08/13/2009  . CARDIOMYOPATHY 04/21/2009  . CONGESTIVE HEART FAILURE 12/04/2007  . CORONARY ARTERY DISEASE 12/04/2007  . HYPERLIPIDEMIA 03/12/2007  . HYPERTENSION 03/12/2007  . OBESITY 04/21/2009  . SLEEP APNEA 03/12/2007  . Plantar fasciitis, bilateral 11/01/2011  . GERD (gastroesophageal reflux disease) 11/01/2011   No past surgical history on file.  reports that he has never smoked. He has never used smokeless tobacco. He reports that he does not drink alcohol or use illicit drugs. family history includes Asthma in his brother; Hypertension in his other. No Known Allergies Current Outpatient Prescriptions on File Prior to Visit  Medication Sig Dispense Refill  . albuterol (PROVENTIL HFA;VENTOLIN HFA) 108 (90 BASE) MCG/ACT inhaler Inhale 2 puffs into the lungs every 6 (six) hours as needed for wheezing.  1 Inhaler  11  . amLODipine (NORVASC) 5 MG tablet TAKE ONE TABLET BY MOUTH ONCE DAILY  90 tablet  0  . aspirin EC 81 MG tablet Take 81 mg by mouth daily.      Marland Kitchen. atorvastatin (LIPITOR) 20 MG tablet TAKE ONE TABLET BY MOUTH EVERY DAY  90 tablet  3  . beclomethasone (QVAR) 80 MCG/ACT inhaler Inhale 1 puff into the lungs as needed.  1 Inhaler  12  . carvedilol (COREG) 25 MG tablet TAKE TWO TABLETS BY MOUTH TWICE DAILY   360 tablet  0  . DIOVAN 160 MG tablet TAKE ONE TABLET BY MOUTH TWICE DAILY  180 tablet  0  . fexofenadine (ALLEGRA) 180 MG tablet Take 180 mg by mouth daily.      Marland Kitchen. FISH OIL-CHOLECALCIFEROL PO Take 1 capsule by mouth daily.        . furosemide (LASIX) 20 MG tablet TAKE TWO TABLETS BY MOUTH ONCE DAILY  180 tablet  0  . Multiple Vitamins-Minerals (MULTIVITAMIN WITH MINERALS) tablet Take 1 tablet by mouth daily.        . naproxen (NAPROSYN) 500 MG tablet Take 1 tablet (500 mg total) by mouth 2 (two) times daily.  30 tablet  0  . pantoprazole (PROTONIX) 40 MG tablet TAKE ONE TABLET BY MOUTH EVERY DAY  90 tablet  3   No current facility-administered medications on file prior to visit.   Review of Systems  Constitutional: Negative for unexpected weight change, or unusual diaphoresis  HENT: Negative for tinnitus.   Eyes: Negative for photophobia and visual disturbance.  Respiratory: Negative for choking and stridor.   Gastrointestinal: Negative for vomiting and blood in stool.  Genitourinary: Negative for hematuria and decreased urine volume.  Musculoskeletal: Negative for acute joint swelling Skin: Negative for color change and wound.  Neurological: Negative for tremors and numbness other than noted  Psychiatric/Behavioral: Negative for decreased concentration or  hyperactivity.       Objective:   Physical Exam BP 130/84  Pulse  68  Temp(Src) 99.1 F (37.3 C) (Oral)  Wt 385 lb (174.635 kg) VS noted, mild ill Constitutional: Pt appears well-developed and well-nourished.  HENT: Head: NCAT.  Right Ear: External ear normal.  Left Ear: External ear normal.  Bilat tm's with mild erythema.  Max sinus areas non tender.  Pharynx with mild erythema, no exudate Eyes: Conjunctivae and EOM are normal. Pupils are equal, round, and reactive to light.  Neck: Normal range of motion. Neck supple.  Cardiovascular: Normal rate and regular rhythm.   Pulmonary/Chest: Effort normal and breath sounds  normal.  - no rales or wheezing Neurological: Pt is alert. Not confused  Skin: Skin is warm. No erythema.  Psychiatric: Pt behavior is normal. Thought content normal.     Assessment & Plan:

## 2013-06-30 NOTE — Assessment & Plan Note (Signed)
stable overall by history and exam, recent data reviewed with pt, and pt to continue medical treatment as before,  to f/u any worsening symptoms or concerns BP Readings from Last 3 Encounters:  06/28/13 130/84  04/27/13 142/88  03/19/13 134/82

## 2013-06-30 NOTE — Assessment & Plan Note (Signed)
Mild to mod, for antibx course,  to f/u any worsening symptoms or concerns 

## 2013-07-02 ENCOUNTER — Telehealth: Payer: Self-pay

## 2013-07-02 NOTE — Telephone Encounter (Signed)
Relevant patient education assigned to patient using Emmi. ° °

## 2013-07-04 ENCOUNTER — Ambulatory Visit: Payer: BC Managed Care – PPO | Admitting: Internal Medicine

## 2013-07-04 ENCOUNTER — Ambulatory Visit (INDEPENDENT_AMBULATORY_CARE_PROVIDER_SITE_OTHER): Payer: BC Managed Care – PPO | Admitting: Internal Medicine

## 2013-07-04 ENCOUNTER — Ambulatory Visit (INDEPENDENT_AMBULATORY_CARE_PROVIDER_SITE_OTHER)
Admission: RE | Admit: 2013-07-04 | Discharge: 2013-07-04 | Disposition: A | Discharge status: Home / Self Care | Payer: BC Managed Care – PPO | Source: Ambulatory Visit | Attending: Internal Medicine | Admitting: Internal Medicine

## 2013-07-04 ENCOUNTER — Encounter: Payer: Self-pay | Admitting: Internal Medicine

## 2013-07-04 VITALS — BP 138/88 | HR 71 | Temp 99.2°F | Ht 69.0 in | Wt 390.0 lb

## 2013-07-04 DIAGNOSIS — R05 Cough: Secondary | ICD-10-CM

## 2013-07-04 DIAGNOSIS — E1165 Type 2 diabetes mellitus with hyperglycemia: Secondary | ICD-10-CM

## 2013-07-04 DIAGNOSIS — R059 Cough, unspecified: Secondary | ICD-10-CM

## 2013-07-04 DIAGNOSIS — J45909 Unspecified asthma, uncomplicated: Secondary | ICD-10-CM

## 2013-07-04 DIAGNOSIS — IMO0001 Reserved for inherently not codable concepts without codable children: Secondary | ICD-10-CM

## 2013-07-04 DIAGNOSIS — R062 Wheezing: Secondary | ICD-10-CM

## 2013-07-04 DIAGNOSIS — J209 Acute bronchitis, unspecified: Secondary | ICD-10-CM

## 2013-07-04 DIAGNOSIS — I10 Essential (primary) hypertension: Secondary | ICD-10-CM

## 2013-07-04 MED ORDER — PREDNISONE 10 MG PO TABS: ORAL_TABLET | ORAL | Status: DC

## 2013-07-04 MED ORDER — LEVOFLOXACIN 500 MG PO TABS: 500.0000 mg | ORAL_TABLET | Freq: Every day | ORAL | Status: DC

## 2013-07-04 MED ORDER — HYDROCODONE-HOMATROPINE 5-1.5 MG/5ML PO SYRP: 5.0000 mL | ORAL_SOLUTION | Freq: Four times a day (QID) | ORAL | Status: DC | PRN

## 2013-07-04 NOTE — Patient Instructions (Signed)
Please take all new medication as prescribed- the antibiotic, cough medicine, and prednisone Please continue all other medications as before, and refills have been done if requested.  Please have the pharmacy call with any other refills you may need.  Please go to the XRAY Department in the Basement (go straight as you get off the elevator) for the x-ray testing  You will be contacted by phone if any changes need to be made immediately.  Otherwise, you will receive a letter about your results with an explanation, but please check with MyChart first.  Please keep your appointments with your specialists as you have planned - Dr Jens Som tomorrow as planned  You are given the work note as well

## 2013-07-04 NOTE — Progress Notes (Signed)
Pre-visit discussion using our clinic review tool. No additional management support is needed unless otherwise documented below in the visit note.  

## 2013-07-04 NOTE — Progress Notes (Signed)
Subjective:    Patient ID: Tyler ScrapeBarry W [name], male    DOB: 17-Sep-1975, 38 y.o.   MRN: [id number]  HPI  Here to f/u last visit with cough/wheezing better for a few days with last tx, then worse again last 3-4 days.  Here with recurring general weakness and malaise, with prod cough with mild wheezing, but Pt denies chest pain, increased sob or doe, orthopnea, PND, increased LE swelling, palpitations, dizziness or syncope. Pt denies new neurological symptoms such as new headache, or facial or extremity weakness or numbness   Pt denies polydipsia, polyuria, Pt states overall good compliance with meds, trying to follow lower cholesterol, diabetic diet, wt overall stable. Past Medical History  Diagnosis Date  . Hypertension   . CHF (congestive heart failure)   . ACHILLES TENDINITIS, BILATERAL 10/16/2008  . ALLERGIC RHINITIS 08/13/2009  . CARDIOMYOPATHY 04/21/2009  . CONGESTIVE HEART FAILURE 12/04/2007  . CORONARY ARTERY DISEASE 12/04/2007  . HYPERLIPIDEMIA 03/12/2007  . HYPERTENSION 03/12/2007  . OBESITY 04/21/2009  . SLEEP APNEA 03/12/2007  . Plantar fasciitis, bilateral 11/01/2011  . GERD (gastroesophageal reflux disease) 11/01/2011   No past surgical history on file.  reports that he has never smoked. He has never used smokeless tobacco. He reports that he does not drink alcohol or use illicit drugs. family history includes Asthma in his brother; Hypertension in his other. No Known Allergies Current Outpatient Prescriptions on File Prior to Visit  Medication Sig Dispense Refill  . albuterol (PROVENTIL HFA;VENTOLIN HFA) 108 (90 BASE) MCG/ACT inhaler Inhale 2 puffs into the lungs every 6 (six) hours as needed for wheezing.  1 Inhaler  11  . amLODipine (NORVASC) 5 MG tablet TAKE ONE TABLET BY MOUTH ONCE DAILY  90 tablet  0  . aspirin EC 81 MG tablet Take 81 mg by mouth daily.      Marland Kitchen. atorvastatin (LIPITOR) 20 MG tablet TAKE ONE TABLET BY MOUTH EVERY DAY  90 tablet  3  . beclomethasone (QVAR) 80 MCG/ACT  inhaler Inhale 1 puff into the lungs as needed.  1 Inhaler  12  . carvedilol (COREG) 25 MG tablet TAKE TWO TABLETS BY MOUTH TWICE DAILY  360 tablet  0  . chlorpheniramine-HYDROcodone (TUSSIONEX PENNKINETIC ER) 10-8 MG/5ML LQCR Take 5 mLs by mouth every 12 (twelve) hours as needed.  140 mL  0  . DIOVAN 160 MG tablet TAKE ONE TABLET BY MOUTH TWICE DAILY  180 tablet  0  . fexofenadine (ALLEGRA) 180 MG tablet Take 180 mg by mouth daily.      Marland Kitchen. FISH OIL-CHOLECALCIFEROL PO Take 1 capsule by mouth daily.        . furosemide (LASIX) 20 MG tablet TAKE TWO TABLETS BY MOUTH ONCE DAILY  180 tablet  0  . Multiple Vitamins-Minerals (MULTIVITAMIN WITH MINERALS) tablet Take 1 tablet by mouth daily.        . naproxen (NAPROSYN) 500 MG tablet Take 1 tablet (500 mg total) by mouth 2 (two) times daily.  30 tablet  0  . pantoprazole (PROTONIX) 40 MG tablet TAKE ONE TABLET BY MOUTH EVERY DAY  90 tablet  3   No current facility-administered medications on file prior to visit.   Review of Systems  Constitutional: Negative for unexpected weight change, or unusual diaphoresis  HENT: Negative for tinnitus.   Eyes: Negative for photophobia and visual disturbance.  Respiratory: Negative for choking and stridor.   Gastrointestinal: Negative for vomiting and blood in stool.  Genitourinary: Negative for hematuria and decreased urine  volume.  Musculoskeletal: Negative for acute joint swelling Skin: Negative for color change and wound.  Neurological: Negative for tremors and numbness other than noted  Psychiatric/Behavioral: Negative for decreased concentration or  hyperactivity.       Objective:   Physical Exam  BP 138/88  Pulse 71  Temp(Src) 99.2 F (37.3 C) (Oral)  Ht 5\' 9"  (1.753 m)  Wt 390 lb (176.903 kg)  BMI 57.57 kg/m2  SpO2 93% VS noted, mild ill Constitutional: Pt appears well-developed and well-nourished.  HENT: Head: NCAT.  Right Ear: External ear normal.  Left Ear: External ear normal.  Eyes:  Conjunctivae and EOM are normal. Pupils are equal, round, and reactive to light.  Neck: Normal range of motion. Neck supple.  Cardiovascular: Normal rate and regular rhythm.   Pulmonary/Chest: Effort normal and breath sounds decreased, few wheeze bilat Neurological: Pt is alert. Not confused  Skin: Skin is warm. No erythema.  Psychiatric: Pt behavior is normal. Thought content normal.     Assessment & Plan:

## 2013-07-05 ENCOUNTER — Ambulatory Visit (INDEPENDENT_AMBULATORY_CARE_PROVIDER_SITE_OTHER): Payer: BC Managed Care – PPO | Admitting: Cardiology

## 2013-07-05 ENCOUNTER — Encounter: Payer: Self-pay | Admitting: Cardiology

## 2013-07-05 VITALS — BP 140/78 | HR 86 | Ht 69.0 in | Wt 390.0 lb

## 2013-07-05 DIAGNOSIS — I2510 Atherosclerotic heart disease of native coronary artery without angina pectoris: Secondary | ICD-10-CM

## 2013-07-05 DIAGNOSIS — I509 Heart failure, unspecified: Secondary | ICD-10-CM

## 2013-07-05 DIAGNOSIS — I10 Essential (primary) hypertension: Secondary | ICD-10-CM

## 2013-07-05 LAB — BASIC METABOLIC PANEL
BUN: 13 mg/dL (ref 6–23)
CO2: 26 mEq/L (ref 19–32)
Calcium: 8.6 mg/dL (ref 8.4–10.5)
Chloride: 103 mEq/L (ref 96–112)
Creatinine, Ser: 1.1 mg/dL (ref 0.4–1.5)
GFR: 100.82 mL/min (ref >60.00)
Glucose, Bld: 113 mg/dL — ABNORMAL HIGH (ref 70–99)
Potassium: 3.8 mEq/L (ref 3.5–5.1)
Sodium: 138 mEq/L (ref 135–145)

## 2013-07-05 LAB — HEPATIC FUNCTION PANEL
ALT: 30 U/L (ref 0–53)
AST: 22 U/L (ref 0–37)
Albumin: 3.8 g/dL (ref 3.5–5.2)
Alkaline Phosphatase: 83 U/L (ref 39–117)
Bilirubin, Direct: 0 mg/dL (ref 0.0–0.3)
Total Bilirubin: 0.5 mg/dL (ref 0.3–1.2)
Total Protein: 7.7 g/dL (ref 6.0–8.3)

## 2013-07-05 LAB — BRAIN NATRIURETIC PEPTIDE: Pro B Natriuretic peptide (BNP): 56 pg/mL (ref 0.0–100.0)

## 2013-07-05 NOTE — Assessment & Plan Note (Signed)
Continue aspirin and statin. 

## 2013-07-05 NOTE — Assessment & Plan Note (Signed)
euvolemic on examination. His symptoms at present sound likely to be a URI. Check BNP. Check potassium and renal function.

## 2013-07-05 NOTE — Assessment & Plan Note (Signed)
Plan continue beta blocker and ARB. Repeat echocardiogram.

## 2013-07-05 NOTE — Assessment & Plan Note (Signed)
Continue statin. Check liver functions.

## 2013-07-05 NOTE — Patient Instructions (Signed)
Your physician wants you to follow-up in: ONE YEAR WITH DR CRENSHAW You will receive a reminder letter in the mail two months in advance. If you don't receive a letter, please call our office to schedule the follow-up appointment.   Your physician has requested that you have an echocardiogram. Echocardiography is a painless test that uses sound waves to create images of your heart. It provides your doctor with information about the size and shape of your heart and how well your heart's chambers and valves are working. This procedure takes approximately one hour. There are no restrictions for this procedure.   Your physician recommends that you HAVE LAB WORK TODAY 

## 2013-07-05 NOTE — Progress Notes (Signed)
HPI: FU hypertension and nonischemic cardiomyopathy. Cardiac catheterization in 2006 revealed normal left main, normal LAD, 25% circumflex, 25-30% right coronary artery and 25-30% PDA. Ejection fraction was 20%. However, his most recent MUGA performed on July 29, 2006, showed that his ejection fraction had improved to 60%. Last echocardiogram in February of 2013 showed normal LV function, grade 1 diastolic dysfunction and moderate to severe left atrial enlargement. Since I last saw him in Jan 2013, he has some dyspnea on exertion but no orthopnea or PND. Occasional mild pedal edema. Occasional indigestion but no exertional chest pain. Recent URI.   Current Outpatient Prescriptions  Medication Sig Dispense Refill  . albuterol (PROVENTIL HFA;VENTOLIN HFA) 108 (90 BASE) MCG/ACT inhaler Inhale 2 puffs into the lungs every 6 (six) hours as needed for wheezing.  1 Inhaler  11  . amLODipine (NORVASC) 5 MG tablet TAKE ONE TABLET BY MOUTH ONCE DAILY  90 tablet  0  . aspirin EC 81 MG tablet Take 81 mg by mouth daily.      Marland Kitchen atorvastatin (LIPITOR) 20 MG tablet TAKE ONE TABLET BY MOUTH EVERY DAY  90 tablet  3  . azithromycin (ZITHROMAX) 250 MG tablet       . beclomethasone (QVAR) 80 MCG/ACT inhaler Inhale 1 puff into the lungs as needed.  1 Inhaler  12  . carvedilol (COREG) 25 MG tablet TAKE TWO TABLETS BY MOUTH TWICE DAILY  360 tablet  0  . chlorpheniramine-HYDROcodone (TUSSIONEX PENNKINETIC ER) 10-8 MG/5ML LQCR Take 5 mLs by mouth every 12 (twelve) hours as needed.  140 mL  0  . DIOVAN 160 MG tablet TAKE ONE TABLET BY MOUTH TWICE DAILY  180 tablet  0  . fexofenadine (ALLEGRA) 180 MG tablet Take 180 mg by mouth daily.      Marland Kitchen FISH OIL-CHOLECALCIFEROL PO Take 1 capsule by mouth daily.        . furosemide (LASIX) 20 MG tablet TAKE TWO TABLETS BY MOUTH ONCE DAILY  180 tablet  0  . HYDROcodone-homatropine (HYCODAN) 5-1.5 MG/5ML syrup Take 5 mLs by mouth every 6 (six) hours as needed for cough.  120 mL   0  . levofloxacin (LEVAQUIN) 500 MG tablet Take 1 tablet (500 mg total) by mouth daily.  10 tablet  0  . Multiple Vitamins-Minerals (MULTIVITAMIN WITH MINERALS) tablet Take 1 tablet by mouth daily.        . naproxen (NAPROSYN) 500 MG tablet Take 1 tablet (500 mg total) by mouth 2 (two) times daily.  30 tablet  0  . pantoprazole (PROTONIX) 40 MG tablet TAKE ONE TABLET BY MOUTH EVERY DAY  90 tablet  3  . predniSONE (DELTASONE) 10 MG tablet 3 tabs by mouth per day for 3 days,2tabs per day for 3 days,1tab per day for 3 days  18 tablet  0   No current facility-administered medications for this visit.     Past Medical History  Diagnosis Date  . Hypertension   . CHF (congestive heart failure)   . ACHILLES TENDINITIS, BILATERAL 10/16/2008  . ALLERGIC RHINITIS 08/13/2009  . CARDIOMYOPATHY 04/21/2009  . CONGESTIVE HEART FAILURE 12/04/2007  . CORONARY ARTERY DISEASE 12/04/2007  . HYPERLIPIDEMIA 03/12/2007  . HYPERTENSION 03/12/2007  . OBESITY 04/21/2009  . SLEEP APNEA 03/12/2007  . Plantar fasciitis, bilateral 11/01/2011  . GERD (gastroesophageal reflux disease) 11/01/2011    History reviewed. No pertinent past surgical history.  History   Social History  . Marital Status: Married    Spouse  Name: N/A    Number of Children: 2  . Years of Education: N/A   Occupational History  .     Social History Main Topics  . Smoking status: Never Smoker   . Smokeless tobacco: Never Used  . Alcohol Use: No  . Drug Use: No  . Sexual Activity: Not on file   Other Topics Concern  . Not on file   Social History Narrative  . No narrative on file    ROS: recent productive cough but no hemoptysis, dysphasia, odynophagia, melena, hematochezia, dysuria, hematuria, rash, seizure activity, orthopnea, PND, claudication. Remaining systems are negative.  Physical Exam: Well-developed morbidly obese in no acute distress.  Skin is warm and dry.  HEENT is normal.  Neck is supple.  Chest is clear to  auscultation with normal expansion.  Cardiovascular exam is regular rate and rhythm.  Abdominal exam nontender or distended. No masses palpated. Extremities show no edema. neuro grossly intact  ECG sinus rhythm at a rate of 86. No significant ST changes.

## 2013-07-05 NOTE — Assessment & Plan Note (Signed)
Blood pressure controlled. Continue present medications. 

## 2013-07-06 NOTE — Assessment & Plan Note (Addendum)
Mild to mod, for antibx course,  to f/u any worsening symptoms or concerns - for cxr, r/o pna

## 2013-07-06 NOTE — Assessment & Plan Note (Signed)
stable overall by history and exam, recent data reviewed with pt, and pt to continue medical treatment as before,  to f/u any worsening symptoms or concerns Lab Results  Component Value Date   HGBA1C 6.5 06/01/2012   To call for onset polys or cbg > 200

## 2013-07-06 NOTE — Assessment & Plan Note (Signed)
stable overall by history and exam, recent data reviewed with pt, and pt to continue medical treatment as before,  to f/u any worsening symptoms or concerns BP Readings from Last 3 Encounters:  07/05/13 140/78  07/04/13 138/88  06/28/13 130/84

## 2013-07-06 NOTE — Assessment & Plan Note (Signed)
Mild to mod, for higher dose predpack asd,  to f/u any worsening symptoms or concerns

## 2013-07-11 ENCOUNTER — Other Ambulatory Visit: Payer: Self-pay | Admitting: Cardiology

## 2013-07-22 ENCOUNTER — Ambulatory Visit: Payer: BC Managed Care – PPO | Admitting: Cardiology

## 2013-07-26 ENCOUNTER — Ambulatory Visit (INDEPENDENT_AMBULATORY_CARE_PROVIDER_SITE_OTHER): Payer: BC Managed Care – PPO | Admitting: Pulmonary Disease

## 2013-07-26 ENCOUNTER — Other Ambulatory Visit: Payer: Self-pay | Admitting: Pulmonary Disease

## 2013-07-26 ENCOUNTER — Encounter: Payer: Self-pay | Admitting: Pulmonary Disease

## 2013-07-26 VITALS — BP 142/96 | HR 68 | Temp 97.4°F | Ht 69.0 in | Wt 393.0 lb

## 2013-07-26 DIAGNOSIS — G4733 Obstructive sleep apnea (adult) (pediatric): Secondary | ICD-10-CM

## 2013-07-26 NOTE — Progress Notes (Signed)
Subjective:    Patient ID: Tyler [name], male    DOB: 01/26/1976, 38 y.o.   MRN: [id number]  HPI The patient is a 38 year old male who comes in today as a self-referral for evaluation of possible sleep apnea. The patient has been noted to have loud snoring by his wife, as well as an abnormal breathing pattern during sleep. The patient states that he awakens 2 times a night, and thinks that he is fairly rested in the mornings upon awakening. He denies any significant inappropriate daytime sleepiness, and only occasionally has issues at night with sleepiness while watching TV or movies. The patient denies any sleepiness while driving, and is currently a bus driver.  He does take an occasional nap on weekends when he tries to catch up on his sleep. The patient states that his weight is up 50 pounds over the last 2 years, and his Epworth score today is 0.   Sleep Questionnaire What time do you typically go to bed?( Between what hours) 1030-1130 1030-1130 at 1040 on 07/26/13 by Maisie Fus, CMA How long does it take you to fall asleep?  at 1040 on 07/26/13 by Maisie Fus, CMA How many times during the night do you wake up? 2 2 at 1040 on 07/26/13 by Maisie Fus, CMA What time do you get out of bed to start your day? No Value 4am/6am at 1040 on 07/26/13 by Maisie Fus, CMA Do you drive or operate heavy machinery in your occupation? YesYes city bus driver at 2952 on 84/13/24 by Maisie Fus, CMA How much has your weight changed (up or down) over the past two years? (In pounds) 20 lb (9.072 kg) 20 lb (9.072 kg) at 1040 on 07/26/13 by Maisie Fus, CMA Have you ever had a sleep study before? No No at 1040 on 07/26/13 by Maisie Fus, CMA Do you currently use CPAP? No No at 1040 on 07/26/13 by Maisie Fus, CMA Do you wear oxygen at any time? No No at 1040 on 07/26/13 by Maisie Fus, CMA   Review of Systems  Constitutional: Negative for fever and  unexpected weight change.  HENT: Negative for congestion, dental problem, ear pain, nosebleeds, postnasal drip, rhinorrhea, sinus pressure, sneezing, sore throat and trouble swallowing.        Recent URI  Eyes: Negative for redness and itching.  Respiratory: Negative for cough, chest tightness, shortness of breath and wheezing.        Recent URI  Cardiovascular: Negative for palpitations and leg swelling.  Gastrointestinal: Negative for nausea and vomiting.  Genitourinary: Negative for dysuria.  Musculoskeletal: Negative for joint swelling.  Skin: Negative for rash.  Neurological: Negative for headaches.  Hematological: Does not bruise/bleed easily.  Psychiatric/Behavioral: Negative for dysphoric mood. The patient is not nervous/anxious.        Objective:   Physical Exam Constitutional:  Morbidly obese male, no acute distress  HENT:  Nares patent without discharge  Oropharynx without exudate, palate and uvula thick and elongated, large tongue.   Eyes:  Perrla, eomi, no scleral icterus  Neck:  No JVD, no TMG  Cardiovascular:  Normal rate, regular rhythm, no rubs or gallops.  No murmurs        Intact distal pulses  Pulmonary :  Normal breath sounds, no stridor or respiratory distress   No rales, rhonchi, or wheezing  Abdominal:  Soft, nondistended, bowel sounds present.  No tenderness noted.   Musculoskeletal:  1+ lower extremity edema noted.  Lymph Nodes:  No cervical lymphadenopathy noted  Skin:  No cyanosis noted  Neurologic:  Alert, appropriate, moves all 4 extremities without obvious deficit.         Assessment & Plan:

## 2013-07-26 NOTE — Assessment & Plan Note (Signed)
The patient's history is suspicious for obstructive sleep apnea, although he does not feel that he has a lot of sleepiness symptoms during the day. The patient is morbidly obese, has had witnessed snoring and apneas during the night, and has a very abnormal upper airway. This combined with his underlying health issues, and his high risk occupation, suggests that he needs to have an evaluation with a sleep study.  It had a long discussion with him about sleep apnea, including its impact to his quality of life and cardiovascular health. I think he is a good candidate for home sleep testing, and the patient is agreeable.

## 2013-07-26 NOTE — Patient Instructions (Signed)
Will schedule for home sleep test, and arrange followup once the results are available. Work on weight loss  

## 2013-07-29 ENCOUNTER — Ambulatory Visit (HOSPITAL_COMMUNITY): Payer: BC Managed Care – PPO | Attending: Cardiology | Admitting: Cardiology

## 2013-07-29 DIAGNOSIS — R0609 Other forms of dyspnea: Secondary | ICD-10-CM | POA: Insufficient documentation

## 2013-07-29 DIAGNOSIS — E785 Hyperlipidemia, unspecified: Secondary | ICD-10-CM | POA: Insufficient documentation

## 2013-07-29 DIAGNOSIS — I509 Heart failure, unspecified: Secondary | ICD-10-CM | POA: Insufficient documentation

## 2013-07-29 DIAGNOSIS — I428 Other cardiomyopathies: Secondary | ICD-10-CM | POA: Insufficient documentation

## 2013-07-29 DIAGNOSIS — I10 Essential (primary) hypertension: Secondary | ICD-10-CM | POA: Insufficient documentation

## 2013-07-29 DIAGNOSIS — I2510 Atherosclerotic heart disease of native coronary artery without angina pectoris: Secondary | ICD-10-CM | POA: Insufficient documentation

## 2013-07-29 DIAGNOSIS — R0989 Other specified symptoms and signs involving the circulatory and respiratory systems: Secondary | ICD-10-CM | POA: Insufficient documentation

## 2013-07-29 DIAGNOSIS — I359 Nonrheumatic aortic valve disorder, unspecified: Secondary | ICD-10-CM | POA: Insufficient documentation

## 2013-07-29 NOTE — Progress Notes (Signed)
Echo performed. 

## 2013-07-31 ENCOUNTER — Other Ambulatory Visit: Payer: Self-pay | Admitting: Cardiology

## 2013-08-01 ENCOUNTER — Other Ambulatory Visit: Payer: Self-pay

## 2013-08-01 ENCOUNTER — Other Ambulatory Visit: Payer: Self-pay | Admitting: Cardiology

## 2013-08-01 MED ORDER — CARVEDILOL 25 MG PO TABS
ORAL_TABLET | ORAL | Status: DC
Start: 2013-08-01 — End: 2014-08-15

## 2013-08-14 ENCOUNTER — Encounter: Payer: Self-pay | Admitting: Internal Medicine

## 2013-08-14 ENCOUNTER — Ambulatory Visit (INDEPENDENT_AMBULATORY_CARE_PROVIDER_SITE_OTHER): Payer: BC Managed Care – PPO | Admitting: Internal Medicine

## 2013-08-14 VITALS — BP 120/78 | HR 72 | Temp 99.5°F | Wt 389.0 lb

## 2013-08-14 DIAGNOSIS — I10 Essential (primary) hypertension: Secondary | ICD-10-CM

## 2013-08-14 DIAGNOSIS — J029 Acute pharyngitis, unspecified: Secondary | ICD-10-CM | POA: Insufficient documentation

## 2013-08-14 DIAGNOSIS — G4733 Obstructive sleep apnea (adult) (pediatric): Secondary | ICD-10-CM

## 2013-08-14 DIAGNOSIS — E1165 Type 2 diabetes mellitus with hyperglycemia: Secondary | ICD-10-CM

## 2013-08-14 DIAGNOSIS — IMO0001 Reserved for inherently not codable concepts without codable children: Secondary | ICD-10-CM

## 2013-08-14 MED ORDER — AMOXICILLIN 500 MG PO CAPS: ORAL_CAPSULE | ORAL | Status: DC

## 2013-08-14 NOTE — Progress Notes (Signed)
Subjective:    Patient ID: Tyler [name], male    DOB: 1975/07/19, 38 y.o.   MRN: [id number]  HPI here with acute c/o severe ST x 3 days, with fever, hard to swallow, feels swollen, with some recent left ear popping/crackling as well but no ear d/c or hearing loss; daughter dx with strep yesterday, now on pcn.  Pt denies chest pain, increased sob or doe, wheezing, orthopnea, PND, increased LE swelling, palpitations, dizziness or syncope.  Has marked fatigue, had to leave work yesterday.  Pt denies polydipsia, polyuria,  Past Medical History  Diagnosis Date  . Hypertension   . CHF (congestive heart failure)   . ACHILLES TENDINITIS, BILATERAL 10/16/2008  . ALLERGIC RHINITIS 08/13/2009  . CARDIOMYOPATHY 04/21/2009  . CONGESTIVE HEART FAILURE 12/04/2007  . CORONARY ARTERY DISEASE 12/04/2007  . HYPERLIPIDEMIA 03/12/2007  . HYPERTENSION 03/12/2007  . OBESITY 04/21/2009  . SLEEP APNEA 03/12/2007  . Plantar fasciitis, bilateral 11/01/2011  . GERD (gastroesophageal reflux disease) 11/01/2011   Past Surgical History  Procedure Laterality Date  . No past surgeries      reports that he has never smoked. He has never used smokeless tobacco. He reports that he does not drink alcohol or use illicit drugs. family history includes Asthma in his brother; Hypertension in his other. No Known Allergies Current Outpatient Prescriptions on File Prior to Visit  Medication Sig Dispense Refill  . albuterol (PROVENTIL HFA;VENTOLIN HFA) 108 (90 BASE) MCG/ACT inhaler Inhale 2 puffs into the lungs every 6 (six) hours as needed for wheezing.  1 Inhaler  11  . amLODipine (NORVASC) 5 MG tablet TAKE ONE TABLET BY MOUTH ONCE DAILY  90 tablet  0  . aspirin EC 81 MG tablet Take 81 mg by mouth daily.      Marland Kitchen. atorvastatin (LIPITOR) 20 MG tablet TAKE ONE TABLET BY MOUTH EVERY DAY  90 tablet  3  . beclomethasone (QVAR) 80 MCG/ACT inhaler Inhale 1 puff into the lungs as needed.  1 Inhaler  12  . carvedilol (COREG) 25 MG tablet  TAKE TWO TABLETS BY MOUTH TWICE DAILY  360 tablet  3  . chlorpheniramine-HYDROcodone (TUSSIONEX PENNKINETIC ER) 10-8 MG/5ML LQCR Take 5 mLs by mouth every 12 (twelve) hours as needed.  140 mL  0  . fexofenadine (ALLEGRA) 180 MG tablet Take 180 mg by mouth daily.      Marland Kitchen. FISH OIL-CHOLECALCIFEROL PO Take 1 capsule by mouth daily.        . furosemide (LASIX) 20 MG tablet TAKE TWO TABLETS BY MOUTH ONCE DAILY  180 tablet  2  . Multiple Vitamins-Minerals (MULTIVITAMIN WITH MINERALS) tablet Take 1 tablet by mouth daily.        . naproxen (NAPROSYN) 500 MG tablet Take 1 tablet (500 mg total) by mouth 2 (two) times daily.  30 tablet  0  . pantoprazole (PROTONIX) 40 MG tablet TAKE ONE TABLET BY MOUTH EVERY DAY  90 tablet  3  . valsartan (DIOVAN) 160 MG tablet TAKE ONE TABLET BY MOUTH TWICE DAILY  180 tablet  2   No current facility-administered medications on file prior to visit.   Review of Systems  Constitutional: Negative for unexpected weight change, or unusual diaphoresis  HENT: Negative for tinnitus.   Eyes: Negative for photophobia and visual disturbance.  Respiratory: Negative for choking and stridor.   Gastrointestinal: Negative for vomiting and blood in stool.  Genitourinary: Negative for hematuria and decreased urine volume.  Musculoskeletal: Negative for acute joint swelling Skin:  Negative for color change and wound.  Neurological: Negative for tremors and numbness other than noted  Psychiatric/Behavioral: Negative for decreased concentration or  hyperactivity.       Objective:   Physical Exam BP 120/78  Pulse 72  Temp(Src) 99.5 F (37.5 C) (Oral)  Wt 389 lb (176.449 kg)  SpO2 96% VS noted, mild ill Constitutional: Pt appears well-developed and well-nourished. Lavella Lemons, markedly fatigued HENT: Head: NCAT.  Right Ear: External ear normal.  Left Ear: External ear normal.  Eyes: Conjunctivae and EOM are normal. Pupils are equal, round, and reactive to light.  Bilat tm's with mild  erythema.  Max sinus areas mild tender.  Pharynx with severe erythema, 1-2+ exudate and left tonsillar hypertrophy Neck: Normal range of motion. Neck supple.  Cardiovascular: Normal rate and regular rhythm.   Pulmonary/Chest: Effort normal and breath sounds normal.  Neurological: Pt is alert. Not confused  Skin: Skin is warm. No erythema.  Psychiatric: Pt behavior is normal. Thought content normal.     Assessment & Plan:

## 2013-08-14 NOTE — Assessment & Plan Note (Signed)
stable overall by history and exam, recent data reviewed with pt, and pt to continue medical treatment as before,  to f/u any worsening symptoms or concerns Lab Results  Component Value Date   HGBA1C 6.5 06/01/2012   To f/u with any worsening polys or cbg > 200

## 2013-08-14 NOTE — Progress Notes (Signed)
Pre visit review using our clinic review tool, if applicable. No additional management support is needed unless otherwise documented below in the visit note. 

## 2013-08-14 NOTE — Assessment & Plan Note (Signed)
stable overall by history and exam, recent data reviewed with pt, and pt to continue medical treatment as before,  to f/u any worsening symptoms or concerns SpO2 Readings from Last 3 Encounters:  08/14/13 96%  07/26/13 99%  07/04/13 93%

## 2013-08-14 NOTE — Assessment & Plan Note (Signed)
Mild to mod, cant r/o acute strep given FH, for antibx course,  to f/u any worsening symptoms or concerns

## 2013-08-14 NOTE — Assessment & Plan Note (Signed)
Possibly sympt worse with some congestion, for work note as well, o/w cont same tx

## 2013-08-14 NOTE — Patient Instructions (Signed)
Please take all new medication as prescribed Please continue all other medications as before, and refills have been done if requested. Please have the pharmacy call with any other refills you may need.  You are given the work note as well

## 2013-08-26 DIAGNOSIS — G4733 Obstructive sleep apnea (adult) (pediatric): Secondary | ICD-10-CM

## 2013-08-28 ENCOUNTER — Encounter: Payer: Self-pay | Admitting: Pulmonary Disease

## 2013-08-28 ENCOUNTER — Telehealth: Payer: Self-pay | Admitting: Pulmonary Disease

## 2013-08-28 DIAGNOSIS — G4733 Obstructive sleep apnea (adult) (pediatric): Secondary | ICD-10-CM

## 2013-08-28 NOTE — Telephone Encounter (Signed)
lmomtcb x1 

## 2013-08-28 NOTE — Telephone Encounter (Signed)
Pt needs ov to review sleep study 

## 2013-08-29 NOTE — Telephone Encounter (Signed)
lmomtcb x 2  

## 2013-08-30 NOTE — Telephone Encounter (Signed)
appt set for 09-20-13. [name], CMA

## 2013-09-10 ENCOUNTER — Encounter: Payer: Self-pay | Admitting: Pulmonary Disease

## 2013-09-12 ENCOUNTER — Other Ambulatory Visit: Payer: Self-pay

## 2013-09-12 MED ORDER — ATORVASTATIN CALCIUM 20 MG PO TABS: 20.0000 mg | ORAL_TABLET | Freq: Every day | ORAL | Status: DC

## 2013-09-20 ENCOUNTER — Ambulatory Visit: Payer: BC Managed Care – PPO | Admitting: Pulmonary Disease

## 2013-10-04 ENCOUNTER — Encounter: Payer: Self-pay | Admitting: Pulmonary Disease

## 2013-10-04 ENCOUNTER — Ambulatory Visit (INDEPENDENT_AMBULATORY_CARE_PROVIDER_SITE_OTHER): Payer: BC Managed Care – PPO | Admitting: Pulmonary Disease

## 2013-10-04 VITALS — BP 138/74 | HR 78 | Temp 97.8°F | Ht 69.0 in | Wt 394.6 lb

## 2013-10-04 DIAGNOSIS — G4733 Obstructive sleep apnea (adult) (pediatric): Secondary | ICD-10-CM

## 2013-10-04 NOTE — Progress Notes (Signed)
   Subjective:    Patient ID: Tyler [name], male    DOB: 10-05-1975, 38 y.o.   MRN: [id number]  HPI Patient comes in today for followup of his recent sleep study. He was found to have mild to moderate OSA, with an AHI of 17 events per hour. He also had significant oxygen desaturation that was transient in nature. I have reviewed the study with him in detail, and answered all of his questions.   Review of Systems  Constitutional: Negative for fever and unexpected weight change.  HENT: Negative for congestion, dental problem, ear pain, nosebleeds, postnasal drip, rhinorrhea, sinus pressure, sneezing, sore throat and trouble swallowing.   Eyes: Negative for redness and itching.  Respiratory: Negative for cough, chest tightness, shortness of breath and wheezing.   Cardiovascular: Negative for palpitations and leg swelling.  Gastrointestinal: Negative for nausea and vomiting.  Genitourinary: Negative for dysuria.  Musculoskeletal: Negative for joint swelling.  Skin: Negative for rash.  Neurological: Negative for headaches.  Hematological: Does not bruise/bleed easily.  Psychiatric/Behavioral: Negative for dysphoric mood. The patient is not nervous/anxious.        Objective:   Physical Exam Obese male in no acute distress Nose without purulence or discharge noted Neck without lymphadenopathy or thyromegaly Lower extremities with mild edema, no cyanosis Alert and oriented, moves all 4 extremities.       Assessment & Plan:

## 2013-10-04 NOTE — Patient Instructions (Signed)
Will start on cpap at a moderate pressure level.  Please call if having issues with tolerance Work on weight loss followup with me in 8 weeks.  

## 2013-10-04 NOTE — Assessment & Plan Note (Signed)
The patient has mild to moderate obstructive sleep apnea, and I have had a long discussion with him about the various treatment options. We discussed a trial of weight loss alone, as well as dental appliance and CPAP. Considering his high-risk occupation, I have asked him to try CPAP. He is willing to do this, even though he does not have any sleepiness with driving. I've also encouraged him to work aggressively on weight loss.

## 2013-10-23 ENCOUNTER — Other Ambulatory Visit: Payer: Self-pay | Admitting: Cardiology

## 2013-11-24 ENCOUNTER — Other Ambulatory Visit: Payer: Self-pay | Admitting: Internal Medicine

## 2013-11-29 ENCOUNTER — Ambulatory Visit: Payer: BC Managed Care – PPO | Admitting: Pulmonary Disease

## 2013-12-02 ENCOUNTER — Encounter: Payer: Self-pay | Admitting: Pulmonary Disease

## 2013-12-23 ENCOUNTER — Telehealth: Payer: Self-pay | Admitting: *Deleted

## 2013-12-23 DIAGNOSIS — IMO0001 Reserved for inherently not codable concepts without codable children: Secondary | ICD-10-CM

## 2013-12-23 DIAGNOSIS — E1165 Type 2 diabetes mellitus with hyperglycemia: Principal | ICD-10-CM

## 2013-12-23 NOTE — Telephone Encounter (Signed)
Left message on machine for patient to schedule an appointment to follow up with diabetes and cholesterol.  Lipid, bmet, a1c  Diabetic bundle

## 2014-01-29 ENCOUNTER — Telehealth: Payer: Self-pay | Admitting: Internal Medicine

## 2014-01-29 NOTE — Telephone Encounter (Signed)
Left voice mail for patient to call back to schedule cpe.  Last cpe was 03/19/2013

## 2014-02-11 ENCOUNTER — Other Ambulatory Visit (INDEPENDENT_AMBULATORY_CARE_PROVIDER_SITE_OTHER): Payer: BC Managed Care – PPO

## 2014-02-11 ENCOUNTER — Encounter: Payer: Self-pay | Admitting: Internal Medicine

## 2014-02-11 ENCOUNTER — Ambulatory Visit (INDEPENDENT_AMBULATORY_CARE_PROVIDER_SITE_OTHER): Payer: BC Managed Care – PPO | Admitting: Internal Medicine

## 2014-02-11 VITALS — BP 116/84 | HR 69 | Temp 98.4°F | Wt 394.5 lb

## 2014-02-11 DIAGNOSIS — IMO0001 Reserved for inherently not codable concepts without codable children: Secondary | ICD-10-CM

## 2014-02-11 DIAGNOSIS — Z23 Encounter for immunization: Secondary | ICD-10-CM

## 2014-02-11 DIAGNOSIS — E669 Obesity, unspecified: Secondary | ICD-10-CM

## 2014-02-11 DIAGNOSIS — E1165 Type 2 diabetes mellitus with hyperglycemia: Secondary | ICD-10-CM

## 2014-02-11 DIAGNOSIS — Z0000 Encounter for general adult medical examination without abnormal findings: Secondary | ICD-10-CM

## 2014-02-11 LAB — URINALYSIS, ROUTINE W REFLEX MICROSCOPIC
Bilirubin Urine: NEGATIVE
Hgb urine dipstick: NEGATIVE
Ketones, ur: NEGATIVE
Leukocytes, UA: NEGATIVE
Nitrite: NEGATIVE
Specific Gravity, Urine: 1.02 (ref 1.000–1.030)
Total Protein, Urine: NEGATIVE
Urine Glucose: NEGATIVE
Urobilinogen, UA: 0.2 (ref 0.0–1.0)
WBC, UA: NONE SEEN (ref 0–?)
pH: 5.5 (ref 5.0–8.0)

## 2014-02-11 LAB — CBC WITH DIFFERENTIAL/PLATELET
Basophils Absolute: 0 10*3/uL (ref 0.0–0.1)
Basophils Relative: 0.5 % (ref 0.0–3.0)
Eosinophils Absolute: 0.3 10*3/uL (ref 0.0–0.7)
Eosinophils Relative: 3.6 % (ref 0.0–5.0)
HCT: 43.2 % (ref 39.0–52.0)
Hemoglobin: 14.2 g/dL (ref 13.0–17.0)
Lymphocytes Relative: 30.9 % (ref 12.0–46.0)
Lymphs Abs: 2.5 10*3/uL (ref 0.7–4.0)
MCHC: 33 g/dL (ref 30.0–36.0)
MCV: 85.8 fl (ref 78.0–100.0)
Monocytes Absolute: 0.8 10*3/uL (ref 0.1–1.0)
Monocytes Relative: 10.2 % (ref 3.0–12.0)
Neutro Abs: 4.4 10*3/uL (ref 1.4–7.7)
Neutrophils Relative %: 54.8 % (ref 43.0–77.0)
Platelets: 233 10*3/uL (ref 150.0–400.0)
RBC: 5.03 Mil/uL (ref 4.22–5.81)
RDW: 15.1 % (ref 11.5–15.5)
WBC: 8.1 10*3/uL (ref 4.0–10.5)

## 2014-02-11 LAB — MICROALBUMIN / CREATININE URINE RATIO
Creatinine,U: 266 mg/dL
Microalb Creat Ratio: 1.2 mg/g (ref 0.0–30.0)
Microalb, Ur: 3.3 mg/dL — ABNORMAL HIGH (ref 0.0–1.9)

## 2014-02-11 LAB — HEMOGLOBIN A1C: Hgb A1c MFr Bld: 6.8 % — ABNORMAL HIGH (ref 4.6–6.5)

## 2014-02-11 NOTE — Patient Instructions (Addendum)
You had the flu shot today  You will be contacted regarding the referral for: General Surgury for the bariatric surgury evaluation  Please continue all other medications as before, and refills have been done if requested.  Please have the pharmacy call with any other refills you may need.  Please continue your efforts at being more active, low cholesterol diet, and weight control.  You are otherwise up to date with prevention measures today.  Please keep your appointments with your specialists as you may have planned  Please go to the LAB in the Basement (turn left off the elevator) for the tests to be done today  You will be contacted by phone if any changes need to be made immediately.  Otherwise, you will receive a letter about your results with an explanation, but please check with MyChart first.  Please remember to sign up for MyChart if you have not done so, as this will be important to you in the future with finding out test results, communicating by private email, and scheduling acute appointments online when needed.  Please return in 6 months, or sooner if needed, with Lab testing done 3-5 days before

## 2014-02-11 NOTE — Assessment & Plan Note (Signed)

## 2014-02-11 NOTE — Progress Notes (Signed)
Pre visit review using our clinic review tool, if applicable. No additional management support is needed unless otherwise documented below in the visit note. 

## 2014-02-11 NOTE — Assessment & Plan Note (Signed)
stable overall by history and exam,  and pt to continue medical treatment as before,  to f/u any worsening symptoms or concerns, for f/u a1c 

## 2014-02-11 NOTE — Assessment & Plan Note (Signed)
For baritatric surg referral

## 2014-02-11 NOTE — Progress Notes (Signed)
Subjective:    Patient ID: Tyler [name], male    DOB: 1976/04/03, 38 y.o.   MRN: [id number]  HPI  Here for wellness and f/u;  Overall doing ok;  Pt denies CP, worsening SOB, DOE, wheezing, orthopnea, PND, worsening LE edema, palpitations, dizziness or syncope.  Pt denies neurological change such as new headache, facial or extremity weakness.  Pt denies polydipsia, polyuria, or low sugar symptoms. Pt states overall good compliance with treatment and medications, good tolerability, and has been trying to follow lower cholesterol diet.  Pt denies worsening depressive symptoms, suicidal ideation or panic. No fever, night sweats, wt loss, loss of appetite, or other constitutional symptoms.  Pt states good ability with ADL's, has low fall risk, home safety reviewed and adequate, no other significant changes in hearing or vision, and only occasionally active with exercise.  No current compalints. Requests referral for bariatric surgury Past Medical History  Diagnosis Date  . Hypertension   . CHF (congestive heart failure)   . ACHILLES TENDINITIS, BILATERAL 10/16/2008  . ALLERGIC RHINITIS 08/13/2009  . CARDIOMYOPATHY 04/21/2009  . CONGESTIVE HEART FAILURE 12/04/2007  . CORONARY ARTERY DISEASE 12/04/2007  . HYPERLIPIDEMIA 03/12/2007  . HYPERTENSION 03/12/2007  . OBESITY 04/21/2009  . SLEEP APNEA 03/12/2007  . Plantar fasciitis, bilateral 11/01/2011  . GERD (gastroesophageal reflux disease) 11/01/2011   Past Surgical History  Procedure Laterality Date  . No past surgeries      reports that he has never smoked. He has never used smokeless tobacco. He reports that he does not drink alcohol or use illicit drugs. family history includes Asthma in his brother; Hypertension in his other. No Known Allergies Current Outpatient Prescriptions on File Prior to Visit  Medication Sig Dispense Refill  . albuterol (PROVENTIL HFA;VENTOLIN HFA) 108 (90 BASE) MCG/ACT inhaler Inhale 2 puffs into the lungs every 6 (six)  hours as needed for wheezing.  1 Inhaler  11  . amLODipine (NORVASC) 5 MG tablet TAKE ONE TABLET BY MOUTH ONCE DAILY  90 tablet  1  . aspirin EC 81 MG tablet Take 81 mg by mouth daily.      Marland Kitchen atorvastatin (LIPITOR) 20 MG tablet Take 1 tablet (20 mg total) by mouth daily at 6 PM.  90 tablet  3  . beclomethasone (QVAR) 80 MCG/ACT inhaler Inhale 1 puff into the lungs as needed.  1 Inhaler  12  . carvedilol (COREG) 25 MG tablet TAKE TWO TABLETS BY MOUTH TWICE DAILY  360 tablet  3  . chlorpheniramine-HYDROcodone (TUSSIONEX PENNKINETIC ER) 10-8 MG/5ML LQCR Take 5 mLs by mouth every 12 (twelve) hours as needed.  140 mL  0  . fexofenadine (ALLEGRA) 180 MG tablet Take 180 mg by mouth daily.      Marland Kitchen FISH OIL-CHOLECALCIFEROL PO Take 1 capsule by mouth daily.        . furosemide (LASIX) 20 MG tablet TAKE TWO TABLETS BY MOUTH ONCE DAILY  180 tablet  2  . Multiple Vitamins-Minerals (MULTIVITAMIN WITH MINERALS) tablet Take 1 tablet by mouth daily.        . naproxen (NAPROSYN) 500 MG tablet Take 1 tablet (500 mg total) by mouth 2 (two) times daily.  30 tablet  0  . pantoprazole (PROTONIX) 40 MG tablet TAKE ONE TABLET BY MOUTH ONCE DAILY  90 tablet  2  . valsartan (DIOVAN) 160 MG tablet TAKE ONE TABLET BY MOUTH TWICE DAILY  180 tablet  2   No current facility-administered medications on file prior to visit.  Review of Systems Constitutional: Negative for increased diaphoresis, other activity, appetite or other siginficant weight change  HENT: Negative for worsening hearing loss, ear pain, facial swelling, mouth sores and neck stiffness.   Eyes: Negative for other worsening pain, redness or visual disturbance.  Respiratory: Negative for shortness of breath and wheezing.   Cardiovascular: Negative for chest pain and palpitations.  Gastrointestinal: Negative for diarrhea, blood in stool, abdominal distention or other pain Genitourinary: Negative for hematuria, flank pain or change in urine volume.    Musculoskeletal: Negative for myalgias or other joint complaints.  Skin: Negative for color change and wound.  Neurological: Negative for syncope and numbness. other than noted Hematological: Negative for adenopathy. or other swelling Psychiatric/Behavioral: Negative for hallucinations, self-injury, decreased concentration or other worsening agitation.      Objective:   Physical Exam BP 116/84  Pulse 69  Temp(Src) 98.4 F (36.9 C) (Oral)  Wt 394 lb 8 oz (178.944 kg)  SpO2 93% VS noted,  Constitutional: Pt is oriented to person, place, and time. Appears well-developed and well-nourished.  Head: Normocephalic and atraumatic.  Right Ear: External ear normal.  Left Ear: External ear normal.  Nose: Nose normal.  Mouth/Throat: Oropharynx is clear and moist.  Eyes: Conjunctivae and EOM are normal. Pupils are equal, round, and reactive to light.  Neck: Normal range of motion. Neck supple. No JVD present. No tracheal deviation present.  Cardiovascular: Normal rate, regular rhythm, normal heart sounds and intact distal pulses.   Pulmonary/Chest: Effort normal and breath sounds without rales or wheezing  Abdominal: Soft. Bowel sounds are normal. NT. No HSM  Musculoskeletal: Normal range of motion. Exhibits no edema.  Lymphadenopathy:  Has no cervical adenopathy.  Neurological: Pt is alert and oriented to person, place, and time. Pt has normal reflexes. No cranial nerve deficit. Motor grossly intact Skin: Skin is warm and dry. No rash noted.  Psychiatric:  Has normal mood and affect. Behavior is normal.     Assessment & Plan:

## 2014-02-12 LAB — HEPATIC FUNCTION PANEL
ALT: 32 U/L (ref 0–53)
AST: 27 U/L (ref 0–37)
Albumin: 3.9 g/dL (ref 3.5–5.2)
Alkaline Phosphatase: 77 U/L (ref 39–117)
Bilirubin, Direct: 0.1 mg/dL (ref 0.0–0.3)
Total Bilirubin: 0.4 mg/dL (ref 0.2–1.2)
Total Protein: 7.2 g/dL (ref 6.0–8.3)

## 2014-02-12 LAB — LIPID PANEL
Cholesterol: 152 mg/dL (ref 0–200)
HDL: 32.9 mg/dL — ABNORMAL LOW (ref >39.00)
LDL Cholesterol: 84 mg/dL (ref 0–99)
NonHDL: 119.1
Total CHOL/HDL Ratio: 5
Triglycerides: 176 mg/dL — ABNORMAL HIGH (ref 0.0–149.0)
VLDL: 35.2 mg/dL (ref 0.0–40.0)

## 2014-02-12 LAB — BASIC METABOLIC PANEL
BUN: 13 mg/dL (ref 6–23)
CO2: 29 mEq/L (ref 19–32)
Calcium: 8.8 mg/dL (ref 8.4–10.5)
Chloride: 103 mEq/L (ref 96–112)
Creatinine, Ser: 1.1 mg/dL (ref 0.4–1.5)
GFR: 97.29 mL/min (ref >60.00)
Glucose, Bld: 88 mg/dL (ref 70–99)
Potassium: 4.3 mEq/L (ref 3.5–5.1)
Sodium: 138 mEq/L (ref 135–145)

## 2014-02-12 LAB — PSA: PSA: 0.6 ng/mL (ref 0.10–4.00)

## 2014-02-12 LAB — TSH: TSH: 2.38 u[IU]/mL (ref 0.35–4.50)

## 2014-06-26 ENCOUNTER — Other Ambulatory Visit: Payer: Self-pay

## 2014-06-26 MED ORDER — AMLODIPINE BESYLATE 5 MG PO TABS: 5.0000 mg | ORAL_TABLET | Freq: Every day | ORAL | Status: DC

## 2014-08-13 ENCOUNTER — Other Ambulatory Visit: Payer: Self-pay

## 2014-08-13 NOTE — Telephone Encounter (Signed)
Approved     Disp Refills Start End   furosemide (LASIX) 20 MG tablet 180 tablet 2 07/11/2013    Sig:  TAKE TWO TABLETS BY MOUTH ONCE DAILY   Class:  Normal   DAW:  No   Authorizing Provider:  Lewayne Bunting, MD   Ordering User:  Valrie Hart, CMA

## 2014-08-15 ENCOUNTER — Other Ambulatory Visit: Payer: Self-pay | Admitting: *Deleted

## 2014-08-15 ENCOUNTER — Other Ambulatory Visit: Payer: Self-pay

## 2014-08-15 MED ORDER — FUROSEMIDE 20 MG PO TABS: 40.0000 mg | ORAL_TABLET | Freq: Every day | ORAL | Status: DC

## 2014-08-15 MED ORDER — VALSARTAN 160 MG PO TABS: 160.0000 mg | ORAL_TABLET | Freq: Two times a day (BID) | ORAL | Status: DC

## 2014-08-15 MED ORDER — CARVEDILOL 25 MG PO TABS: ORAL_TABLET | ORAL | Status: DC

## 2014-08-29 ENCOUNTER — Emergency Department (HOSPITAL_COMMUNITY)
Admission: EM | Admit: 2014-08-29 | Discharge: 2014-08-29 | Disposition: A | Discharge status: Home / Self Care | Payer: BLUE CROSS/BLUE SHIELD | Source: Home / Self Care | Attending: Emergency Medicine | Admitting: Emergency Medicine

## 2014-08-29 ENCOUNTER — Encounter (HOSPITAL_COMMUNITY): Payer: Self-pay | Admitting: Emergency Medicine

## 2014-08-29 DIAGNOSIS — J209 Acute bronchitis, unspecified: Secondary | ICD-10-CM

## 2014-08-29 MED ORDER — HYDROCOD POLST-CHLORPHEN POLST 10-8 MG/5ML PO LQCR: 5.0000 mL | Freq: Two times a day (BID) | ORAL | Status: DC | PRN

## 2014-08-29 MED ORDER — PREDNISONE 50 MG PO TABS: ORAL_TABLET | ORAL | Status: DC

## 2014-08-29 MED ORDER — AZITHROMYCIN 250 MG PO TABS: ORAL_TABLET | ORAL | Status: DC

## 2014-08-29 NOTE — ED Notes (Signed)
C/o cold sx onset yest  Sx include PND, fevers, dry cough, chills Alert, no signs of acute distress.

## 2014-08-29 NOTE — ED Provider Notes (Signed)
CSN: [id number]     Arrival date & time 08/29/14  3428 History   First MD Initiated Contact with Patient 08/29/14 1958     Chief Complaint  Patient presents with  . URI   (Consider location/radiation/quality/duration/timing/severity/associated sxs/prior Treatment) HPI He is a 39 year old man here for evaluation of cough. His symptoms started last night with postnasal drip. He reports a severe cough. It is nonproductive. He has had fever today. Denies shortness of breath. No sore throat. He has tried some Mucinex without much improvement. He states whenever this happens he ends up getting pneumonia.  Past Medical History  Diagnosis Date  . Hypertension   . CHF (congestive heart failure)   . ACHILLES TENDINITIS, BILATERAL 10/16/2008  . ALLERGIC RHINITIS 08/13/2009  . CARDIOMYOPATHY 04/21/2009  . CONGESTIVE HEART FAILURE 12/04/2007  . CORONARY ARTERY DISEASE 12/04/2007  . HYPERLIPIDEMIA 03/12/2007  . HYPERTENSION 03/12/2007  . OBESITY 04/21/2009  . SLEEP APNEA 03/12/2007  . Plantar fasciitis, bilateral 11/01/2011  . GERD (gastroesophageal reflux disease) 11/01/2011   Past Surgical History  Procedure Laterality Date  . No past surgeries     Family History  Problem Relation Age of Onset  . Asthma Brother   . Hypertension Other    History  Substance Use Topics  . Smoking status: Never Smoker   . Smokeless tobacco: Never Used  . Alcohol Use: No    Review of Systems  Constitutional: Positive for fever.  HENT: Positive for congestion and postnasal drip. Negative for sore throat.   Respiratory: Positive for cough. Negative for shortness of breath.   Gastrointestinal: Negative for nausea and vomiting.  Musculoskeletal: Negative for myalgias.  Neurological: Negative for headaches.    Allergies  Review of patient's allergies indicates no known allergies.  Home Medications   Prior to Admission medications   Medication Sig Start Date End Date Taking? Authorizing Provider  aspirin EC  81 MG tablet Take 81 mg by mouth daily.   Yes Historical Provider, MD  carvedilol (COREG) 25 MG tablet TAKE TWO TABLETS BY MOUTH TWICE DAILY 08/15/14  Yes Lewayne Bunting, MD  furosemide (LASIX) 20 MG tablet Take 2 tablets (40 mg total) by mouth daily. 08/15/14  Yes Lewayne Bunting, MD  valsartan (DIOVAN) 160 MG tablet Take 1 tablet (160 mg total) by mouth 2 (two) times daily. 08/15/14  Yes Lewayne Bunting, MD  albuterol (PROVENTIL HFA;VENTOLIN HFA) 108 (90 BASE) MCG/ACT inhaler Inhale 2 puffs into the lungs every 6 (six) hours as needed for wheezing. 03/19/13   [name], MD  amLODipine (NORVASC) 5 MG tablet Take 1 tablet (5 mg total) by mouth daily. 06/26/14   Lewayne Bunting, MD  atorvastatin (LIPITOR) 20 MG tablet Take 1 tablet (20 mg total) by mouth daily at 6 PM. 09/12/13   [name], MD  azithromycin (ZITHROMAX Z-PAK) 250 MG tablet Take 2 pills today, then 1 pill daily until gone. 08/29/14   Charm Rings, MD  beclomethasone (QVAR) 80 MCG/ACT inhaler Inhale 1 puff into the lungs as needed. 03/19/13   [name], MD  chlorpheniramine-HYDROcodone (TUSSIONEX) 10-8 MG/5ML LQCR Take 5 mLs by mouth every 12 (twelve) hours as needed for cough. 08/29/14   Charm Rings, MD  fexofenadine (ALLEGRA) 180 MG tablet Take 180 mg by mouth daily.    Historical Provider, MD  FISH OIL-CHOLECALCIFEROL PO Take 1 capsule by mouth daily.      Historical Provider, MD  Multiple Vitamins-Minerals (MULTIVITAMIN WITH MINERALS) tablet Take  1 tablet by mouth daily.      Historical Provider, MD  naproxen (NAPROSYN) 500 MG tablet Take 1 tablet (500 mg total) by mouth 2 (two) times daily. 09/08/12   Tatyana Kirichenko, PA-C  pantoprazole (PROTONIX) 40 MG tablet TAKE ONE TABLET BY MOUTH ONCE DAILY    [name], MD  predniSONE (DELTASONE) 50 MG tablet Take 1 pill daily for 5 days. 08/29/14   Charm Rings, MD   BP 156/81 mmHg  Pulse 87  Temp(Src) 101.2 F (38.4 C) (Oral)  Resp 22  SpO2 100% Physical Exam   Constitutional: He is oriented to person, place, and time. He appears well-developed and well-nourished. No distress.  Neck: Neck supple.  Cardiovascular: Normal rate, regular rhythm and normal heart sounds.   No murmur heard. Pulmonary/Chest: Effort normal. No respiratory distress. He has wheezes (mmild and scattered). He has no rales.  Neurological: He is alert and oriented to person, place, and time.    ED Course  Procedures (including critical care time) Labs Review Labs Reviewed - No data to display  Imaging Review No results found.   MDM   1. Acute bronchitis, unspecified organism    Will treat with azithromycin, prednisone. Tussionex prescription provided for cough. Return precautions reviewed.    Charm Rings, MD 08/29/14 2016

## 2014-08-29 NOTE — Discharge Instructions (Signed)
You have bronchitis. Take the Z-Pak and prednisone as prescribed. Use the Tussionex twice a day as needed for cough. If you develop difficulty breathing or are not improving in the next 48 hours, please go to the emergency room.

## 2014-08-31 ENCOUNTER — Emergency Department (HOSPITAL_COMMUNITY): Payer: BLUE CROSS/BLUE SHIELD

## 2014-08-31 ENCOUNTER — Encounter (HOSPITAL_COMMUNITY): Payer: Self-pay | Admitting: Emergency Medicine

## 2014-08-31 ENCOUNTER — Emergency Department (HOSPITAL_COMMUNITY)
Admission: EM | Admit: 2014-08-31 | Discharge: 2014-08-31 | Disposition: A | Discharge status: Home / Self Care | Payer: BLUE CROSS/BLUE SHIELD | Attending: Emergency Medicine | Admitting: Emergency Medicine

## 2014-08-31 DIAGNOSIS — I10 Essential (primary) hypertension: Secondary | ICD-10-CM | POA: Diagnosis not present

## 2014-08-31 DIAGNOSIS — J40 Bronchitis, not specified as acute or chronic: Secondary | ICD-10-CM | POA: Diagnosis not present

## 2014-08-31 DIAGNOSIS — K219 Gastro-esophageal reflux disease without esophagitis: Secondary | ICD-10-CM | POA: Insufficient documentation

## 2014-08-31 DIAGNOSIS — Z7982 Long term (current) use of aspirin: Secondary | ICD-10-CM | POA: Diagnosis not present

## 2014-08-31 DIAGNOSIS — Z8739 Personal history of other diseases of the musculoskeletal system and connective tissue: Secondary | ICD-10-CM | POA: Diagnosis not present

## 2014-08-31 DIAGNOSIS — Z792 Long term (current) use of antibiotics: Secondary | ICD-10-CM | POA: Insufficient documentation

## 2014-08-31 DIAGNOSIS — I509 Heart failure, unspecified: Secondary | ICD-10-CM | POA: Diagnosis not present

## 2014-08-31 DIAGNOSIS — Z79899 Other long term (current) drug therapy: Secondary | ICD-10-CM | POA: Diagnosis not present

## 2014-08-31 DIAGNOSIS — E785 Hyperlipidemia, unspecified: Secondary | ICD-10-CM | POA: Insufficient documentation

## 2014-08-31 DIAGNOSIS — Z8669 Personal history of other diseases of the nervous system and sense organs: Secondary | ICD-10-CM | POA: Diagnosis not present

## 2014-08-31 DIAGNOSIS — R05 Cough: Secondary | ICD-10-CM | POA: Diagnosis present

## 2014-08-31 DIAGNOSIS — I2510 Atherosclerotic heart disease of native coronary artery without angina pectoris: Secondary | ICD-10-CM | POA: Insufficient documentation

## 2014-08-31 DIAGNOSIS — Z791 Long term (current) use of non-steroidal anti-inflammatories (NSAID): Secondary | ICD-10-CM | POA: Diagnosis not present

## 2014-08-31 LAB — I-STAT CHEM 8, ED
BUN: 19 mg/dL (ref 6–23)
Calcium, Ion: 1.14 mmol/L (ref 1.12–1.23)
Chloride: 104 mmol/L (ref 96–112)
Creatinine, Ser: 1.2 mg/dL (ref 0.50–1.35)
Glucose, Bld: 227 mg/dL — ABNORMAL HIGH (ref 70–99)
HCT: 45 % (ref 39.0–52.0)
Hemoglobin: 15.3 g/dL (ref 13.0–17.0)
Potassium: 3.9 mmol/L (ref 3.5–5.1)
Sodium: 141 mmol/L (ref 135–145)
TCO2: 21 mmol/L (ref 0–100)

## 2014-08-31 MED ORDER — PREDNISONE 50 MG PO TABS: ORAL_TABLET | ORAL | Status: DC

## 2014-08-31 MED ORDER — IPRATROPIUM-ALBUTEROL 0.5-2.5 (3) MG/3ML IN SOLN
3.0000 mL | Freq: Once | RESPIRATORY_TRACT | Status: AC
  Administered 2014-08-31: 3 mL via RESPIRATORY_TRACT
  Filled 2014-08-31: qty 3

## 2014-08-31 NOTE — ED Provider Notes (Signed)
CSN: [id number]     Arrival date & time 08/31/14  1429 History   First MD Initiated Contact with Patient 08/31/14 1622     Chief Complaint  Patient presents with  . Cough     (Consider location/radiation/quality/duration/timing/severity/associated sxs/prior Treatment) Patient is a 39 y.o. male presenting with cough. The history is provided by the patient.  Cough Cough characteristics:  Productive Sputum characteristics:  Yellow and bloody Severity:  Moderate Onset quality:  Gradual Duration:  3 days Timing:  Intermittent Progression:  Worsening Chronicity:  New Smoker: no   Relieved by:  Nothing Ineffective treatments:  Beta-agonist inhaler and cough suppressants Associated symptoms: sinus congestion and wheezing    Tyler [name] is a 39 y.o. male who presents to the ED with a cough that started 3 days ago. He was evaluated at Urgent Care 2 days ago and treated with Zithromax, prednisone and Tussionex. Patient states that he is not feeling any better and now has noted blood in his sputum. Patient reports hx of CHF and sees a doctor once a year for that.  He denies having been on any long trips, no lower extremity pain. He is a bus Hospital doctor.   Past Medical History  Diagnosis Date  . Hypertension   . CHF (congestive heart failure)   . ACHILLES TENDINITIS, BILATERAL 10/16/2008  . ALLERGIC RHINITIS 08/13/2009  . CARDIOMYOPATHY 04/21/2009  . CONGESTIVE HEART FAILURE 12/04/2007  . CORONARY ARTERY DISEASE 12/04/2007  . HYPERLIPIDEMIA 03/12/2007  . HYPERTENSION 03/12/2007  . OBESITY 04/21/2009  . SLEEP APNEA 03/12/2007  . Plantar fasciitis, bilateral 11/01/2011  . GERD (gastroesophageal reflux disease) 11/01/2011   Past Surgical History  Procedure Laterality Date  . No past surgeries     Family History  Problem Relation Age of Onset  . Asthma Brother   . Hypertension Other    History  Substance Use Topics  . Smoking status: Never Smoker   . Smokeless tobacco: Never Used  .  Alcohol Use: No    Review of Systems  Respiratory: Positive for cough and wheezing.   all other systems negative    Allergies  Review of patient's allergies indicates no known allergies.  Home Medications   Prior to Admission medications   Medication Sig Start Date End Date Taking? Authorizing Provider  albuterol (PROVENTIL HFA;VENTOLIN HFA) 108 (90 BASE) MCG/ACT inhaler Inhale 2 puffs into the lungs every 6 (six) hours as needed for wheezing. 03/19/13   [name], MD  amLODipine (NORVASC) 5 MG tablet Take 1 tablet (5 mg total) by mouth daily. 06/26/14   Lewayne Bunting, MD  aspirin EC 81 MG tablet Take 81 mg by mouth daily.    Historical Provider, MD  atorvastatin (LIPITOR) 20 MG tablet Take 1 tablet (20 mg total) by mouth daily at 6 PM. 09/12/13   [name], MD  azithromycin (ZITHROMAX Z-PAK) 250 MG tablet Take 2 pills today, then 1 pill daily until gone. 08/29/14   Charm Rings, MD  beclomethasone (QVAR) 80 MCG/ACT inhaler Inhale 1 puff into the lungs as needed. 03/19/13   [name], MD  carvedilol (COREG) 25 MG tablet TAKE TWO TABLETS BY MOUTH TWICE DAILY 08/15/14   Lewayne Bunting, MD  chlorpheniramine-HYDROcodone (TUSSIONEX) 10-8 MG/5ML LQCR Take 5 mLs by mouth every 12 (twelve) hours as needed for cough. 08/29/14   Charm Rings, MD  fexofenadine (ALLEGRA) 180 MG tablet Take 180 mg by mouth daily.    Historical Provider, MD  FISH OIL-CHOLECALCIFEROL PO Take 1 capsule by mouth daily.      Historical Provider, MD  furosemide (LASIX) 20 MG tablet Take 2 tablets (40 mg total) by mouth daily. 08/15/14   Lewayne Bunting, MD  Multiple Vitamins-Minerals (MULTIVITAMIN WITH MINERALS) tablet Take 1 tablet by mouth daily.      Historical Provider, MD  naproxen (NAPROSYN) 500 MG tablet Take 1 tablet (500 mg total) by mouth 2 (two) times daily. 09/08/12   Tatyana Kirichenko, PA-C  pantoprazole (PROTONIX) 40 MG tablet TAKE ONE TABLET BY MOUTH ONCE DAILY    [name], MD  predniSONE  (DELTASONE) 50 MG tablet Take 1 pill daily for 5 days. 08/29/14   Charm Rings, MD  predniSONE (DELTASONE) 50 MG tablet Take one tablet daily. Starting the day after you finish you current Rx. 08/31/14   Janne Napoleon, NP  valsartan (DIOVAN) 160 MG tablet Take 1 tablet (160 mg total) by mouth 2 (two) times daily. 08/15/14   Lewayne Bunting, MD   BP 113/50 mmHg  Pulse 71  Temp(Src) 99.2 F (37.3 C) (Oral)  Resp 22  Ht  (1.753 m)  Wt 375 lb (170.099 kg)  BMI 55.35 kg/m2  SpO2 92% Physical Exam  Constitutional: He is oriented to person, place, and time. No distress.  Morbidly obese  Eyes: Conjunctivae and EOM are normal.  Neck: Normal range of motion. Neck supple.  Cardiovascular: Normal rate and regular rhythm.   Pulmonary/Chest: Effort normal. He has wheezes in the right middle field. Rhonchi: occasional.  Abdominal: Soft. Bowel sounds are normal. There is no tenderness.  Musculoskeletal: Normal range of motion.  Neurological: He is alert and oriented to person, place, and time. No cranial nerve deficit.  Skin: Skin is warm and dry.  Psychiatric: He has a normal mood and affect. His behavior is normal.  Nursing note and vitals reviewed.   ED Course  Procedures (including critical care time) Labs Review Labs Reviewed  I-STAT CHEM 8, ED - Abnormal; Notable for the following:    Glucose, Bld 227 (*)    All other components within normal limits    Imaging Review Dg Chest 2 View (if Patient Has Fever And/or Copd)  08/31/2014   CLINICAL DATA:  Cough, fever, midsternal chest pain and shortness of breath 2 days. Patient on antibiotics and today extent hemoptysis.  EXAM: CHEST  2 VIEW  COMPARISON:  07/04/2013  FINDINGS: Lungs are hypoinflated without lobar consolidation or effusion. Slight prominence of the perihilar markings likely due to the degree of hypoinflation. Cardiomediastinal silhouette and remainder of the exam is unchanged.  IMPRESSION: Hypoinflation without acute  cardiopulmonary disease.   Electronically Signed   By: Elberta Fortis M.D.   On: 08/31/2014 15:24    Duo Neb which improved symptoms. Dr. Anitra Lauth in to examine the patient. Patient without respiratory distress, no long trips recently. No shortness of breath.  MDM  39 y.o. male with cough and congestion and concern today due to bright red blood in his sputum. I have reviewed this patient's vital signs, nurses notes, appropriate labs and imaging. I have discussed findings and plan of care with the patient. He voices understanding. He will follow up with his PCP. Stable for d/c without respiratory distress O2 SAT 92-94% on R/A. Will extend prednisone an additional 3 day. He will continue his cough medication and his Z-Pak. Patient will return here as needed for worsening symptoms.   Final diagnoses:  Bronchitis  Geronimo, NP 08/31/14 1800  Gwyneth Sprout, MD 09/01/14 [phone]

## 2014-08-31 NOTE — ED Notes (Signed)
Pt c/o cough ongoing since Friday. Pt seen at urgent care and given prescription medications. Pt reports symptoms worse and that he is finding small blood clots in the phlegm.

## 2014-09-03 ENCOUNTER — Ambulatory Visit (INDEPENDENT_AMBULATORY_CARE_PROVIDER_SITE_OTHER): Payer: BLUE CROSS/BLUE SHIELD | Admitting: Internal Medicine

## 2014-09-03 ENCOUNTER — Other Ambulatory Visit (INDEPENDENT_AMBULATORY_CARE_PROVIDER_SITE_OTHER): Payer: BLUE CROSS/BLUE SHIELD

## 2014-09-03 ENCOUNTER — Encounter: Payer: Self-pay | Admitting: Internal Medicine

## 2014-09-03 ENCOUNTER — Telehealth: Payer: Self-pay | Admitting: *Deleted

## 2014-09-03 ENCOUNTER — Other Ambulatory Visit: Payer: Self-pay | Admitting: Internal Medicine

## 2014-09-03 VITALS — BP 132/88 | HR 59 | Temp 98.7°F | Resp 20 | Ht 69.0 in | Wt 398.1 lb

## 2014-09-03 DIAGNOSIS — Z0000 Encounter for general adult medical examination without abnormal findings: Secondary | ICD-10-CM

## 2014-09-03 DIAGNOSIS — E669 Obesity, unspecified: Secondary | ICD-10-CM

## 2014-09-03 DIAGNOSIS — E119 Type 2 diabetes mellitus without complications: Secondary | ICD-10-CM

## 2014-09-03 DIAGNOSIS — Z0189 Encounter for other specified special examinations: Secondary | ICD-10-CM

## 2014-09-03 DIAGNOSIS — R062 Wheezing: Secondary | ICD-10-CM

## 2014-09-03 DIAGNOSIS — I10 Essential (primary) hypertension: Secondary | ICD-10-CM

## 2014-09-03 LAB — HEPATIC FUNCTION PANEL
ALT: 29 U/L (ref 0–53)
AST: 16 U/L (ref 0–37)
Albumin: 3.8 g/dL (ref 3.5–5.2)
Alkaline Phosphatase: 65 U/L (ref 39–117)
Bilirubin, Direct: 0.1 mg/dL (ref 0.0–0.3)
Total Bilirubin: 0.4 mg/dL (ref 0.2–1.2)
Total Protein: 6.9 g/dL (ref 6.0–8.3)

## 2014-09-03 LAB — BASIC METABOLIC PANEL
BUN: 17 mg/dL (ref 6–23)
CO2: 30 mEq/L (ref 19–32)
Calcium: 8.3 mg/dL — ABNORMAL LOW (ref 8.4–10.5)
Chloride: 104 mEq/L (ref 96–112)
Creatinine, Ser: 1.1 mg/dL (ref 0.40–1.50)
GFR: 94.99 mL/min (ref >60.00)
Glucose, Bld: 211 mg/dL — ABNORMAL HIGH (ref 70–99)
Potassium: 3.3 mEq/L — ABNORMAL LOW (ref 3.5–5.1)
Sodium: 138 mEq/L (ref 135–145)

## 2014-09-03 LAB — LIPID PANEL
Cholesterol: 134 mg/dL (ref 0–200)
HDL: 28.5 mg/dL — ABNORMAL LOW (ref >39.00)
LDL Cholesterol: 71 mg/dL (ref 0–99)
NonHDL: 105.5
Total CHOL/HDL Ratio: 5
Triglycerides: 171 mg/dL — ABNORMAL HIGH (ref 0.0–149.0)
VLDL: 34.2 mg/dL (ref 0.0–40.0)

## 2014-09-03 LAB — HEMOGLOBIN A1C: Hgb A1c MFr Bld: 8.3 % — ABNORMAL HIGH (ref 4.6–6.5)

## 2014-09-03 MED ORDER — BECLOMETHASONE DIPROPIONATE 80 MCG/ACT IN AERS: 1.0000 | INHALATION_SPRAY | RESPIRATORY_TRACT | Status: DC | PRN

## 2014-09-03 MED ORDER — METFORMIN HCL ER 500 MG PO TB24: 500.0000 mg | ORAL_TABLET | Freq: Every day | ORAL | Status: DC

## 2014-09-03 MED ORDER — METHYLPREDNISOLONE ACETATE 80 MG/ML IJ SUSP
80.0000 mg | Freq: Once | INTRAMUSCULAR | Status: AC
  Administered 2014-09-03: 80 mg via INTRAMUSCULAR

## 2014-09-03 NOTE — Assessment & Plan Note (Signed)
stable overall by history and exam, recent data reviewed with pt, and pt to continue medical treatment as before,  to f/u any worsening symptoms or concerns; BP Readings from Last 3 Encounters:  09/03/14 132/88  08/31/14 113/50  08/29/14 156/81

## 2014-09-03 NOTE — Patient Instructions (Addendum)
You had the steroid shot today  OK to finish the prednisone you were treated with from mar 13  Please take all new medication as prescribed - the Qvar   Please continue all other medications as before, including the Albuterol inhaler as needed  Please have the pharmacy call with any other refills you may need.  Please continue your efforts at being more active, low cholesterol diet, and weight control.  Please keep your appointments with your specialists as you may have planned  You will be contacted regarding the referral for: Bariatric surgury, and Pulmonary  Please go to the LAB in the Basement (turn left off the elevator) for the tests to be done today  You will be contacted by phone if any changes need to be made immediately.  Otherwise, you will receive a letter about your results with an explanation, but please check with MyChart first.  Please remember to sign up for MyChart if you have not done so, as this will be important to you in the future with finding out test results, communicating by private email, and scheduling acute appointments online when needed.  Please return in 6 months, or sooner if needed, with Lab testing done 3-5 days before

## 2014-09-03 NOTE — Telephone Encounter (Signed)
[facility] - Client TELEPHONE ADVICE RECORD [facility] Patient Name: Tyler [name] Gender: Male DOB: 1975-10-28 Age: 39 Y 8 M 26 D Return Phone Number: [phone] (Primary), [phone] (Secondary) Address: [address] City/State/Zip: [place] [address] Client [facility] - Client Client Site  [facility] Phys[facility] Oliver Barre Contact Type Call Call Type Triage / Clinical Caller Name Larey Dresser Relationship To Patient Spouse Return Phone Number [phone] (Primary) Chief Complaint Fever (non urgent symptom) (> THREE MONTHS) Initial Comment Caller states, son went to the UC on Friday, dx with bronchitis, has fever, congestion and cough. Temp 102.1 PreDisposition Call Doctor Nurse Assessment Nurse: Annye English, RN, Angelique Blonder Date/Time Lamount Cohen Time): 08/31/2014 1:09:36 PM Confirm and document reason for call. If symptomatic, describe symptoms. ---Pt reports he was seen in UC on Friday, dx with bronchitis, has fever, congestion and cough. Temp 102.1. Placed on Z-pack, Prednisone, and Tussinex. States he cont to have SOB, severe CP when he coughs and coughing bloody sputum. Has the patient traveled out of the country within the last 30 days? ---Not Applicable Does the patient require triage? ---Yes Related visit to physician within the last 2 weeks? ---Yes Does the PT have any chronic conditions? (i.e. diabetes, asthma, etc.) ---Yes List chronic conditions. ---CAD Guidelines Guideline Title Affirmed Question Affirmed Notes Nurse Date/Time Lamount Cohen Time) Coughing Up Blood Difficulty breathing Carmon, RN, Angelique Blonder 08/31/2014 1:12:03 PM Disp. Time Lamount Cohen Time) Disposition Final User 08/31/2014 1:14:22 PM Go to ED Now Yes Carmon, RN, Leighton Ruff Understands: Yes Disagree/Comply: Comply PLEASE NOTE: All timestamps contained within this report are represented as [place] Standard  Time. CONFIDENTIALTY NOTICE: This fax transmission is intended only for the addressee. It contains information that is legally privileged, confidential or otherwise protected from use or disclosure. If you are not the intended recipient, you are strictly prohibited from reviewing, disclosing, copying using or disseminating any of this information or taking any action in reliance on or regarding this information. If you have received this fax in error, please notify us immediately by telephone so that we can arrange for its return to [place]. Phone: [phone], Toll-Free: [phone], Fax: [phone] Page: 2 of 2 Call Id: [id number] Care Advice Given Per Guideline GO TO ED NOW: You need to be seen in the Emergency Department. Go to the ER at ___________ Hospital. Leave now. Drive carefully. BRING MEDICINES: * Please bring a list of your current medicines when you go to the Emergency Department (ER). DRIVING: Another adult should drive. CALL EMS 911 IF: * Severe difficulty breathing * Lips or face turns blue * Passes out or becomes confused. CARE ADVICE given per Coughing Up Blood guideline. After Care Instructions Given Call Event Type User Date / Time Description

## 2014-09-03 NOTE — Progress Notes (Signed)
Pre visit review using our clinic review tool, if applicable. No additional management support is needed unless otherwise documented below in the visit note. 

## 2014-09-03 NOTE — Assessment & Plan Note (Signed)
stable overall by history and exam, recent data reviewed with pt, and pt to continue medical treatment as before,  to f/u any worsening symptoms or concerns Lab Results  Component Value Date   HGBA1C 6.8* 02/11/2014   For fiu a1c

## 2014-09-03 NOTE — Progress Notes (Signed)
Subjective:    Patient ID: Tyler [name], male    DOB: [birth year], 39 y.o.   MRN: [id number]  HPI  Here in f/u from recent ER visit with acute bronchitis, tx with zpack/prednisone/cough med and still some sob and cough scant productive, with some wheezing he's noticed, and recent small BRB with sputum.  CXR mar 13 neg for acute, c/w hyperinflation.  Has note for out of work until tomorrow.  Pt denies chest pain, increased sob or doe, wheezing, orthopnea, PND, increased LE swelling, palpitations, dizziness or syncope, except for the above,  Only thing that seems to work for cough is tussionex, otc not helping, and keeps getting points at work b/c of recurrent coughing illness, then cant drive while taking the tussionex. Still c/o sob/wheeze/tightness, albut MDI helps but only a few hours. Cont's to gain wt, asks for bariatric referral as well.  Has also new DM with mild elev a1c in aug 2015 - due for f/u.  Pt denies polydipsia, polyuria, Not tsking his Qvar lately  Past Medical History  Diagnosis Date  . Hypertension   . CHF (congestive heart failure)   . ACHILLES TENDINITIS, BILATERAL 10/16/2008  . ALLERGIC RHINITIS 08/13/2009  . CARDIOMYOPATHY 04/21/2009  . CONGESTIVE HEART FAILURE 12/04/2007  . CORONARY ARTERY DISEASE 12/04/2007  . HYPERLIPIDEMIA 03/12/2007  . HYPERTENSION 03/12/2007  . OBESITY 04/21/2009  . SLEEP APNEA 03/12/2007  . Plantar fasciitis, bilateral 11/01/2011  . GERD (gastroesophageal reflux disease) 11/01/2011   Past Surgical History  Procedure Laterality Date  . No past surgeries      reports that he has never smoked. He has never used smokeless tobacco. He reports that he does not drink alcohol or use illicit drugs. family history includes Asthma in his brother; Hypertension in his other. No Known Allergies Current Outpatient Prescriptions on File Prior to Visit  Medication Sig Dispense Refill  . albuterol (PROVENTIL HFA;VENTOLIN HFA) 108 (90 BASE) MCG/ACT inhaler Inhale 2  puffs into the lungs every 6 (six) hours as needed for wheezing. 1 Inhaler 11  . amLODipine (NORVASC) 5 MG tablet Take 1 tablet (5 mg total) by mouth daily. 90 tablet 1  . aspirin EC 81 MG tablet Take 81 mg by mouth daily.    Marland Kitchen atorvastatin (LIPITOR) 20 MG tablet Take 1 tablet (20 mg total) by mouth daily at 6 PM. 90 tablet 3  . azithromycin (ZITHROMAX Z-PAK) 250 MG tablet Take 2 pills today, then 1 pill daily until gone. 6 tablet 0  . beclomethasone (QVAR) 80 MCG/ACT inhaler Inhale 1 puff into the lungs as needed. 1 Inhaler 12  . carvedilol (COREG) 25 MG tablet TAKE TWO TABLETS BY MOUTH TWICE DAILY 360 tablet 1  . chlorpheniramine-HYDROcodone (TUSSIONEX) 10-8 MG/5ML LQCR Take 5 mLs by mouth every 12 (twelve) hours as needed for cough. 115 mL 0  . fexofenadine (ALLEGRA) 180 MG tablet Take 180 mg by mouth daily.    Marland Kitchen FISH OIL-CHOLECALCIFEROL PO Take 1 capsule by mouth daily.      . furosemide (LASIX) 20 MG tablet Take 2 tablets (40 mg total) by mouth daily. 180 tablet 0  . Multiple Vitamins-Minerals (MULTIVITAMIN WITH MINERALS) tablet Take 1 tablet by mouth daily.      . naproxen (NAPROSYN) 500 MG tablet Take 1 tablet (500 mg total) by mouth 2 (two) times daily. 30 tablet 0  . pantoprazole (PROTONIX) 40 MG tablet TAKE ONE TABLET BY MOUTH ONCE DAILY 90 tablet 2  . predniSONE (DELTASONE) 50 MG tablet  Take 1 pill daily for 5 days. 5 tablet 0  . predniSONE (DELTASONE) 50 MG tablet Take one tablet daily. Starting the day after you finish you current Rx. 3 tablet 0  . valsartan (DIOVAN) 160 MG tablet Take 1 tablet (160 mg total) by mouth 2 (two) times daily. 180 tablet 2   No current facility-administered medications on file prior to visit.    Review of Systems  Constitutional: Negative for unusual diaphoresis or night sweats HENT: Negative for ringing in ear or discharge Eyes: Negative for double vision or worsening visual disturbance.  Respiratory: Negative for choking and stridor.     Gastrointestinal: Negative for vomiting or other signifcant bowel change Genitourinary: Negative for hematuria or change in urine volume.  Musculoskeletal: Negative for other MSK pain or swelling Skin: Negative for color change and worsening wound.  Neurological: Negative for tremors and numbness other than noted  Psychiatric/Behavioral: Negative for decreased concentration or agitation other than above       Objective:   Physical Exam BP 132/88 mmHg  Pulse 59  Temp(Src) 98.7 F (37.1 C) (Oral)  Resp 20  Ht  (1.753 m)  Wt 398 lb 1.9 oz (180.586 kg)  BMI 58.77 kg/m2  SpO2 93% VS noted,  Constitutional: Pt appears in no significant distress HENT: Head: NCAT.  Right Ear: External ear normal.  Left Ear: External ear normal.  Eyes: . Pupils are equal, round, and reactive to light. Conjunctivae and EOM are normal Neck: Normal range of motion. Neck supple.  Cardiovascular: Normal rate and regular rhythm.   Pulmonary/Chest: Effort normal and breath sounds decreased without rales., trace wheezes bilat Abd:  Soft, NT, ND, + BS Neurological: Pt is alert. Not confused , motor grossly intact Skin: Skin is warm. No rash, no LE edema Psychiatric: Pt behavior is normal. No agitation.      Assessment & Plan:

## 2014-09-03 NOTE — Assessment & Plan Note (Addendum)
Overall mild to mod, ? Copd vs asthma related, ssats ok, ? Obesity hypoventilation or osa element? , for depomedrol IM today, ok to finish the prednisone, to start QVar daily, prn Albut MDI, and refer pulm per pt request (Dr Maple Hudson)

## 2014-09-29 ENCOUNTER — Other Ambulatory Visit: Payer: Self-pay | Admitting: Internal Medicine

## 2014-09-30 ENCOUNTER — Telehealth: Payer: Self-pay | Admitting: *Deleted

## 2014-09-30 NOTE — Progress Notes (Signed)
HPI: FU hypertension and nonischemic cardiomyopathy. Cardiac catheterization in 2006 revealed normal left main, normal LAD, 25% circumflex, 25-30% right coronary artery and 25-30% PDA. Ejection fraction was 20%. However, his most recent MUGA performed on July 29, 2006, showed that his ejection fraction had improved to 60%. Last echocardiogram in February of 2015 showed ejection fraction 50-55%, grade 1 diastolic dysfunction and mild aortic insufficiency. Since I last saw him   Current Outpatient Prescriptions  Medication Sig Dispense Refill  . albuterol (PROVENTIL HFA;VENTOLIN HFA) 108 (90 BASE) MCG/ACT inhaler Inhale 2 puffs into the lungs every 6 (six) hours as needed for wheezing. 1 Inhaler 11  . amLODipine (NORVASC) 5 MG tablet Take 1 tablet (5 mg total) by mouth daily. 90 tablet 1  . aspirin EC 81 MG tablet Take 81 mg by mouth daily.    Marland Kitchen atorvastatin (LIPITOR) 20 MG tablet TAKE ONE TABLET BY MOUTH ONCE DAILY AT  6  PM 90 tablet 2  . azithromycin (ZITHROMAX Z-PAK) 250 MG tablet Take 2 pills today, then 1 pill daily until gone. 6 tablet 0  . beclomethasone (QVAR) 80 MCG/ACT inhaler Inhale 1 puff into the lungs as needed. 1 Inhaler 12  . carvedilol (COREG) 25 MG tablet TAKE TWO TABLETS BY MOUTH TWICE DAILY 360 tablet 1  . chlorpheniramine-HYDROcodone (TUSSIONEX) 10-8 MG/5ML LQCR Take 5 mLs by mouth every 12 (twelve) hours as needed for cough. 115 mL 0  . fexofenadine (ALLEGRA) 180 MG tablet Take 180 mg by mouth daily.    Marland Kitchen FISH OIL-CHOLECALCIFEROL PO Take 1 capsule by mouth daily.      . furosemide (LASIX) 20 MG tablet Take 2 tablets (40 mg total) by mouth daily. 180 tablet 0  . metFORMIN (GLUCOPHAGE-XR) 500 MG 24 hr tablet Take 1 tablet (500 mg total) by mouth daily with breakfast. 90 tablet 3  . Multiple Vitamins-Minerals (MULTIVITAMIN WITH MINERALS) tablet Take 1 tablet by mouth daily.      . naproxen (NAPROSYN) 500 MG tablet Take 1 tablet (500 mg total) by mouth 2 (two) times  daily. 30 tablet 0  . pantoprazole (PROTONIX) 40 MG tablet TAKE ONE TABLET BY MOUTH ONCE DAILY 90 tablet 2  . predniSONE (DELTASONE) 50 MG tablet Take 1 pill daily for 5 days. 5 tablet 0  . predniSONE (DELTASONE) 50 MG tablet Take one tablet daily. Starting the day after you finish you current Rx. 3 tablet 0  . valsartan (DIOVAN) 160 MG tablet Take 1 tablet (160 mg total) by mouth 2 (two) times daily. 180 tablet 2   No current facility-administered medications for this visit.     Past Medical History  Diagnosis Date  . Hypertension   . CHF (congestive heart failure)   . ACHILLES TENDINITIS, BILATERAL 10/16/2008  . ALLERGIC RHINITIS 08/13/2009  . CARDIOMYOPATHY 04/21/2009  . CONGESTIVE HEART FAILURE 12/04/2007  . CORONARY ARTERY DISEASE 12/04/2007  . HYPERLIPIDEMIA 03/12/2007  . HYPERTENSION 03/12/2007  . OBESITY 04/21/2009  . SLEEP APNEA 03/12/2007  . Plantar fasciitis, bilateral 11/01/2011  . GERD (gastroesophageal reflux disease) 11/01/2011    Past Surgical History  Procedure Laterality Date  . No past surgeries      History   Social History  . Marital Status: Married    Spouse Name: N/A  . Number of Children: 2  . Years of Education: N/A   Occupational History  . 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 Smoking status: Never Smoker   .  Smokeless tobacco: Never Used  . Alcohol Use: No  . Drug Use: No  . Sexual Activity: Not on file   Other Topics Concern  . Not on file   Social History Narrative    ROS: no fevers or chills, productive cough, hemoptysis, dysphasia, odynophagia, melena, hematochezia, dysuria, hematuria, rash, seizure activity, orthopnea, PND, pedal edema, claudication. Remaining systems are negative.  Physical Exam: Well-developed well-nourished in no acute distress.  Skin is warm and dry.  HEENT is normal.  Neck is supple.  Chest is clear to auscultation with normal expansion.  Cardiovascular exam is regular rate and rhythm.    Abdominal exam nontender or distended. No masses palpated. Extremities show no edema. neuro grossly intact  ECG     This encounter was created in error - please disregard.

## 2014-09-30 NOTE — Telephone Encounter (Signed)
Left msg on triage stating that md left msg for him to start taking metformin. It stated that if you have congestive heart failure or taking lasix to do not take. Pt states he is has both wanting md to rx something else...Raechel Chute

## 2014-09-30 NOTE — Telephone Encounter (Signed)
No need as this is not a concern.  No one does not take metformin with hx of heart disease or taking lasix  Does NOT need any change

## 2014-10-01 NOTE — Telephone Encounter (Signed)
Called pt no answer LMOM with md response.../lmb 

## 2014-10-03 ENCOUNTER — Encounter: Payer: Self-pay | Admitting: Cardiology

## 2014-10-22 ENCOUNTER — Telehealth: Payer: Self-pay | Admitting: Internal Medicine

## 2014-10-22 NOTE — Telephone Encounter (Signed)
Patient is having a lot of sinus drainage and is requesting a prescription for azithromycin (ZITHROMAX Z-PAK) 250 MG tablet [[id number]] . Pharmacy is [facility] [address].

## 2014-10-22 NOTE — Telephone Encounter (Signed)
Talked to patient and scheduled an appointment for tomorrow evening

## 2014-10-22 NOTE — Telephone Encounter (Signed)
Very sorry., due to office policy, we normally do not prescribe antibiotics by phone request, please consider OV, or try OTC allergy med such as zyrtec and/or Nasacort, as many times the congestion is related to allergies

## 2014-10-23 ENCOUNTER — Encounter: Payer: Self-pay | Admitting: Internal Medicine

## 2014-10-23 ENCOUNTER — Ambulatory Visit (INDEPENDENT_AMBULATORY_CARE_PROVIDER_SITE_OTHER): Payer: BLUE CROSS/BLUE SHIELD | Admitting: Internal Medicine

## 2014-10-23 VITALS — BP 130/84 | HR 66 | Temp 98.6°F | Resp 20 | Ht 69.0 in | Wt 390.0 lb

## 2014-10-23 DIAGNOSIS — J019 Acute sinusitis, unspecified: Secondary | ICD-10-CM | POA: Diagnosis not present

## 2014-10-23 DIAGNOSIS — I10 Essential (primary) hypertension: Secondary | ICD-10-CM

## 2014-10-23 DIAGNOSIS — E119 Type 2 diabetes mellitus without complications: Secondary | ICD-10-CM | POA: Diagnosis not present

## 2014-10-23 MED ORDER — LEVOFLOXACIN 500 MG PO TABS: 500.0000 mg | ORAL_TABLET | Freq: Every day | ORAL | Status: DC

## 2014-10-23 NOTE — Assessment & Plan Note (Signed)
stable overall by history and exam, recent data reviewed with pt, and pt to continue medical treatment as before,  to f/u any worsening symptoms or concerns BP Readings from Last 3 Encounters:  10/23/14 130/84  09/03/14 132/88  08/31/14 113/50

## 2014-10-23 NOTE — Assessment & Plan Note (Signed)
stable overall by history and exam, recent data reviewed with pt, and pt to continue medical treatment as before,  to f/u any worsening symptoms or concerns Lab Results  Component Value Date   HGBA1C 8.3* 09/03/2014

## 2014-10-23 NOTE — Progress Notes (Signed)
Subjective:    Patient ID: Tyler [name], male    DOB: [birth year], 39 y.o.   MRN: [id number]  HPI   Here with 2-3 days acute onset fever, facial pain, pressure, headache, general weakness and malaise, and greenish d/c, with mild ST and cough, but pt denies chest pain, wheezing, increased sob or doe, orthopnea, PND, increased LE swelling, palpitations, dizziness or syncope.  Pt denies polydipsia, polyuria, or low sugar symptoms such as weakness or confusion improved with po intake.  Pt states overall good compliance with meds, trying to follow lower cholesterol, diabetic diet, wt overall stable but little exercise however.    Has not been taking the metformin, decided to work on diet and wt loss first. Pt denies new neurological symptoms such as new headache, or facial or extremity weakness or numbness Past Medical History  Diagnosis Date  . Hypertension   . CHF (congestive heart failure)   . ACHILLES TENDINITIS, BILATERAL 10/16/2008  . ALLERGIC RHINITIS 08/13/2009  . CARDIOMYOPATHY 04/21/2009  . CONGESTIVE HEART FAILURE 12/04/2007  . CORONARY ARTERY DISEASE 12/04/2007  . HYPERLIPIDEMIA 03/12/2007  . HYPERTENSION 03/12/2007  . OBESITY 04/21/2009  . SLEEP APNEA 03/12/2007  . Plantar fasciitis, bilateral 11/01/2011  . GERD (gastroesophageal reflux disease) 11/01/2011   Past Surgical History  Procedure Laterality Date  . No past surgeries      reports that he has never smoked. He has never used smokeless tobacco. He reports that he does not drink alcohol or use illicit drugs. family history includes Asthma in his brother; Hypertension in his other. No Known Allergies Current Outpatient Prescriptions on File Prior to Visit  Medication Sig Dispense Refill  . albuterol (PROVENTIL HFA;VENTOLIN HFA) 108 (90 BASE) MCG/ACT inhaler Inhale 2 puffs into the lungs every 6 (six) hours as needed for wheezing. 1 Inhaler 11  . amLODipine (NORVASC) 5 MG tablet Take 1 tablet (5 mg total) by mouth daily. 90 tablet  1  . aspirin EC 81 MG tablet Take 81 mg by mouth daily.    Marland Kitchen atorvastatin (LIPITOR) 20 MG tablet TAKE ONE TABLET BY MOUTH ONCE DAILY AT  6  PM 90 tablet 2  . azithromycin (ZITHROMAX Z-PAK) 250 MG tablet Take 2 pills today, then 1 pill daily until gone. 6 tablet 0  . beclomethasone (QVAR) 80 MCG/ACT inhaler Inhale 1 puff into the lungs as needed. 1 Inhaler 12  . carvedilol (COREG) 25 MG tablet TAKE TWO TABLETS BY MOUTH TWICE DAILY 360 tablet 1  . chlorpheniramine-HYDROcodone (TUSSIONEX) 10-8 MG/5ML LQCR Take 5 mLs by mouth every 12 (twelve) hours as needed for cough. 115 mL 0  . fexofenadine (ALLEGRA) 180 MG tablet Take 180 mg by mouth daily.    Marland Kitchen FISH OIL-CHOLECALCIFEROL PO Take 1 capsule by mouth daily.      . furosemide (LASIX) 20 MG tablet Take 2 tablets (40 mg total) by mouth daily. 180 tablet 0  . metFORMIN (GLUCOPHAGE-XR) 500 MG 24 hr tablet Take 1 tablet (500 mg total) by mouth daily with breakfast. 90 tablet 3  . Multiple Vitamins-Minerals (MULTIVITAMIN WITH MINERALS) tablet Take 1 tablet by mouth daily.      . naproxen (NAPROSYN) 500 MG tablet Take 1 tablet (500 mg total) by mouth 2 (two) times daily. 30 tablet 0  . pantoprazole (PROTONIX) 40 MG tablet TAKE ONE TABLET BY MOUTH ONCE DAILY 90 tablet 2  . predniSONE (DELTASONE) 50 MG tablet Take 1 pill daily for 5 days. 5 tablet 0  . predniSONE (DELTASONE)  50 MG tablet Take one tablet daily. Starting the day after you finish you current Rx. 3 tablet 0  . valsartan (DIOVAN) 160 MG tablet Take 1 tablet (160 mg total) by mouth 2 (two) times daily. 180 tablet 2   No current facility-administered medications on file prior to visit.   Review of Systems  Constitutional: Negative for unusual diaphoresis or night sweats HENT: Negative for ringing in ear or discharge Eyes: Negative for double vision or worsening visual disturbance.  Respiratory: Negative for choking and stridor.   Gastrointestinal: Negative for vomiting or other signifcant bowel  change Genitourinary: Negative for hematuria or change in urine volume.  Musculoskeletal: Negative for other MSK pain or swelling Skin: Negative for color change and worsening wound.  Neurological: Negative for tremors and numbness other than noted  Psychiatric/Behavioral: Negative for decreased concentration or agitation other than above       Objective:   Physical Exam BP 130/84 mmHg  Pulse 66  Temp(Src) 98.6 F (37 C) (Oral)  Resp 20  Ht 5\' 9"  (1.753 m)  Wt 390 lb (176.903 kg)  BMI 57.57 kg/m2  SpO2 96% VS noted, midl ill Constitutional: Pt appears in no significant distress HENT: Head: NCAT.  Right Ear: External ear normal.  Left Ear: External ear normal.  Bilat tm's with mild erythema.  Max sinus areas mild tender.  Pharynx with mild erythema, no exudate Eyes: . Pupils are equal, round, and reactive to light. Conjunctivae and EOM are normal Neck: Normal range of motion. Neck supple.  Cardiovascular: Normal rate and regular rhythm.   Pulmonary/Chest: Effort normal and breath sounds without rales or wheezing.  Abd:  Soft, NT, ND, + BS Neurological: Pt is alert. Not confused , motor grossly intact Skin: Skin is warm. No rash, no LE edema Psychiatric: Pt behavior is normal. No agitation.     Assessment & Plan:

## 2014-10-23 NOTE — Patient Instructions (Signed)
Please take all new medication as prescribed - the antibiotic  OK to stay off the metformin for now  You can also take Delsym OTC for cough, and/or Mucinex (or it's generic off brand) for congestion, and tylenol as needed for pain.  Please continue all other medications as before, and refills have been done if requested.  Please have the pharmacy call with any other refills you may need.  Please continue your efforts at being more active, low cholesterol diet, and weight control.  Please keep your appointments with your specialists as you may have planned

## 2014-10-23 NOTE — Progress Notes (Signed)
Pre visit review using our clinic review tool, if applicable. No additional management support is needed unless otherwise documented below in the visit note. 

## 2014-10-23 NOTE — Assessment & Plan Note (Signed)
Mild to mod, for antibx course,  to f/u any worsening symptoms or concerns 

## 2014-10-28 ENCOUNTER — Encounter: Payer: Self-pay | Admitting: Cardiology

## 2014-10-28 ENCOUNTER — Ambulatory Visit (INDEPENDENT_AMBULATORY_CARE_PROVIDER_SITE_OTHER): Payer: BLUE CROSS/BLUE SHIELD | Admitting: Cardiology

## 2014-10-28 VITALS — BP 130/88 | HR 57 | Ht 69.0 in | Wt 389.3 lb

## 2014-10-28 DIAGNOSIS — G4733 Obstructive sleep apnea (adult) (pediatric): Secondary | ICD-10-CM

## 2014-10-28 DIAGNOSIS — I5022 Chronic systolic (congestive) heart failure: Secondary | ICD-10-CM

## 2014-10-28 DIAGNOSIS — R079 Chest pain, unspecified: Secondary | ICD-10-CM | POA: Diagnosis not present

## 2014-10-28 DIAGNOSIS — I429 Cardiomyopathy, unspecified: Secondary | ICD-10-CM

## 2014-10-28 DIAGNOSIS — I10 Essential (primary) hypertension: Secondary | ICD-10-CM

## 2014-10-28 DIAGNOSIS — I2510 Atherosclerotic heart disease of native coronary artery without angina pectoris: Secondary | ICD-10-CM | POA: Diagnosis not present

## 2014-10-28 DIAGNOSIS — I428 Other cardiomyopathies: Secondary | ICD-10-CM

## 2014-10-28 DIAGNOSIS — E785 Hyperlipidemia, unspecified: Secondary | ICD-10-CM

## 2014-10-28 NOTE — Progress Notes (Signed)
Cardiology Office Note   Date:  10/28/2014   ID:  Tyler [name], Tyler [name] Jul 05, 1975, MRN [id number]  PCP:  Oliver Barre, MD  Cardiologist:  Dr. Jens Som    Chief Complaint  Patient presents with  . Chest Pain    in middle of chest/lasts for a few seconds and takes his breath away  . Shortness of Breath    on exertion      History of Present Illness: Tyler [name] is a 39 y.o. male who presents for HTN and NICM.  He does have CAD with non obst CAD of LCX and RCA by cath 2006.  Last seen by Dr. Jens Som a year ago and pt was stable.   More complete hx.  Cardiac catheterization in 2006 revealed normal left main, normal LAD, 25% circumflex, 25-30% right coronary artery and 25-30% PDA. Ejection fraction was 20%. However, his most recent MUGA performed on July 29, 2006, showed that his ejection fraction had improved to 60%. Last echocardiogram in February of 2013 showed normal LV function, grade 1 diastolic dysfunction and moderate to severe left atrial enlargement.  Most recent echo  07/2013: Left ventricle: The cavity size was normal. There was moderate focal basal and mild concentric hypertrophy. Systolic function was normal. The estimated ejection fraction was in the range of 50% to 55%. Wall motion was normal; there were no regional wall motion abnormalities. There was an increased relative contribution of atrial contraction to ventricular filling. Doppler parameters are consistent with abnormal left ventricular relaxation (grade 1 diastolic dysfunction). - Aortic valve: Mild regurgitation. - Atrial septum: A patent foramen ovale cannot be excluded. Recommendations: Consider study if clinically indicated with agitated saline contrast administration  Today he presents with yearly visit and chest pain.  He has started to exercise on the treadmill and he develops a burning mid sternal that he is able to walk through.  He thought it was GERD and his PCP placed him  on protonix but symptoms have not changed.  Only occurs with exertion and associated with SOB.  No nausea, diaphoresis.  He tells me his parents had premature CAD.   Also he was told he had mild to mod OSA but has not picked up the CPAP.  He will discuss again with his PCP.   Past Medical History  Diagnosis Date  . Hypertension   . CHF (congestive heart failure)   . ACHILLES TENDINITIS, BILATERAL 10/16/2008  . ALLERGIC RHINITIS 08/13/2009  . CARDIOMYOPATHY 04/21/2009  . CONGESTIVE HEART FAILURE 12/04/2007  . CORONARY ARTERY DISEASE 12/04/2007  . HYPERLIPIDEMIA 03/12/2007  . HYPERTENSION 03/12/2007  . OBESITY 04/21/2009  . SLEEP APNEA 03/12/2007  . Plantar fasciitis, bilateral 11/01/2011  . GERD (gastroesophageal reflux disease) 11/01/2011    Past Surgical History  Procedure Laterality Date  . No past surgeries       Current Outpatient Prescriptions  Medication Sig Dispense Refill  . albuterol (PROVENTIL HFA;VENTOLIN HFA) 108 (90 BASE) MCG/ACT inhaler Inhale 2 puffs into the lungs every 6 (six) hours as needed for wheezing. 1 Inhaler 11  . amLODipine (NORVASC) 5 MG tablet Take 1 tablet (5 mg total) by mouth daily. 90 tablet 1  . aspirin EC 81 MG tablet Take 81 mg by mouth daily.    Marland Kitchen atorvastatin (LIPITOR) 20 MG tablet TAKE ONE TABLET BY MOUTH ONCE DAILY AT  6  PM 90 tablet 2  . azithromycin (ZITHROMAX Z-PAK) 250 MG tablet Take 2 pills today, then 1 pill daily until gone. 6  tablet 0  . beclomethasone (QVAR) 80 MCG/ACT inhaler Inhale 1 puff into the lungs as needed. 1 Inhaler 12  . carvedilol (COREG) 25 MG tablet TAKE TWO TABLETS BY MOUTH TWICE DAILY 360 tablet 1  . chlorpheniramine-HYDROcodone (TUSSIONEX) 10-8 MG/5ML LQCR Take 5 mLs by mouth every 12 (twelve) hours as needed for cough. 115 mL 0  . fexofenadine (ALLEGRA) 180 MG tablet Take 180 mg by mouth daily.    Marland Kitchen FISH OIL-CHOLECALCIFEROL PO Take 1 capsule by mouth daily.      . furosemide (LASIX) 20 MG tablet Take 2 tablets (40 mg  total) by mouth daily. 180 tablet 0  . levofloxacin (LEVAQUIN) 500 MG tablet Take 1 tablet (500 mg total) by mouth daily. 10 tablet 0  . Multiple Vitamins-Minerals (MULTIVITAMIN WITH MINERALS) tablet Take 1 tablet by mouth daily.      . naproxen (NAPROSYN) 500 MG tablet Take 1 tablet (500 mg total) by mouth 2 (two) times daily. 30 tablet 0  . pantoprazole (PROTONIX) 40 MG tablet TAKE ONE TABLET BY MOUTH ONCE DAILY 90 tablet 2  . predniSONE (DELTASONE) 50 MG tablet Take 1 pill daily for 5 days. 5 tablet 0  . predniSONE (DELTASONE) 50 MG tablet Take one tablet daily. Starting the day after you finish you current Rx. 3 tablet 0  . valsartan (DIOVAN) 160 MG tablet Take 1 tablet (160 mg total) by mouth 2 (two) times daily. 180 tablet 2   No current facility-administered medications for this visit.    Allergies:   Review of patient's allergies indicates no known allergies.    Social History:  The patient  reports that he has never smoked. He has never used smokeless tobacco. He reports that he does not drink alcohol or use illicit drugs.   Family History:  The patient's family history includes Asthma in his brother; Hypertension in his other.  And heart disease with both parents per pt.   ROS:  General:no colds or fevers- though did have in Feb this year and treated with steroids and ABX , + weight gain Skin:no rashes or ulcers HEENT:no blurred vision, no congestion CV:see HPI PUL:see HPI GI:no diarrhea constipation or melena, no indigestion GU:no hematuria, no dysuria MS:no joint pain, no claudication Neuro:no syncope, no lightheadedness Endo:+ diabetes, no thyroid disease  Wt Readings from Last 3 Encounters:  10/23/14 390 lb (176.903 kg)  09/03/14 398 lb 1.9 oz (180.586 kg)  08/31/14 375 lb (170.099 kg)     PHYSICAL EXAM: VS:  There were no vitals taken for this visit. , BMI There is no weight on file to calculate BMI. General:Pleasant affect, NAD Skin:Warm and dry, brisk  capillary refill HEENT:normocephalic, sclera clear, mucus membranes moist Neck:supple, no JVD, no bruits  Heart:S1S2 RRR without murmur, gallup, rub or click Lungs:clear without rales, rhonchi, or wheezes QTM:AUQJF, soft, non tender, + BS, do not palpate liver spleen or masses Ext:no lower ext edema, 2+ pedal pulses, 2+ radial pulses Neuro:alert and oriented X 3, MAE, follows commands, + facial symmetry    EKG:  EKG is ordered today. The ekg ordered today demonstrates SB at 57 no acute changes otherwise.     Recent Labs: 02/11/2014: Platelets 233.0; TSH 2.38 08/31/2014: Hemoglobin 15.3 09/03/2014: ALT 29; BUN 17; Creatinine 1.10; Potassium 3.3*; Sodium 138    Lipid Panel    Component Value Date/Time   CHOL 134 09/03/2014 1135   TRIG 171.0* 09/03/2014 1135   HDL 28.50* 09/03/2014 1135   CHOLHDL 5 09/03/2014 1135  VLDL 34.2 09/03/2014 1135   LDLCALC 71 09/03/2014 1135   LDLDIRECT 183.6 08/02/2011 1642       Other studies Reviewed: Additional studies/ records that were reviewed today include: previous notes meds as below.   ASSESSMENT AND PLAN:  Chest pain- ETT myoview- he will keep appt with Dr. Velta Addison in August unless stress test is positive.  Cardiomyopathy with stable echo las year -continue BB and ARB.  EF 50-55% G1DD  Chronic systolic HF, euvolemic today, continue lasix- discussed importance of decreasing salt.   CAD, per cath 2006, now with resuming exercise has had chest burning that he can exercise through associated with DOE.  Will do ETT myoview to eval for ischemia.   Hyperlipidemia  Continue statin.recent LDL 71   Elevated HgBA1C- followed by Dr. Melvyn Novas  DM-2  To begin metformin  OSA- encouraged him to get CPAP  HTN -stable today     Current medicines are reviewed with the patient today.  The patient Has no concerns regarding medicines- pt not on steroids or ABX. He has no questions on planned study.  The following changes have been made:  See  above Labs/ tests ordered today include:see above  Disposition:   FU:  see above  Nyoka Lint, NP  10/28/2014 3:43 PM    [facility] [address] [place], [place], [place]  [address]/ [address] [place], [place] Phone: [phone]; Fax: [phone]  [phone]

## 2014-10-28 NOTE — Patient Instructions (Signed)
Your physician recommends that you schedule a follow-up appointment Keep appointment on 01/18/2014 at 3:45 pm  Your physician has requested that you have en exercise stress myoview. For further information please visit [email or web address] Please follow instruction sheet, as given.

## 2014-10-29 ENCOUNTER — Encounter: Payer: Self-pay | Admitting: Cardiology

## 2014-10-29 ENCOUNTER — Telehealth (HOSPITAL_COMMUNITY): Payer: Self-pay | Admitting: *Deleted

## 2014-11-13 ENCOUNTER — Ambulatory Visit (INDEPENDENT_AMBULATORY_CARE_PROVIDER_SITE_OTHER): Payer: BLUE CROSS/BLUE SHIELD | Admitting: Internal Medicine

## 2014-11-13 VITALS — BP 126/82 | HR 59 | Ht 69.0 in | Wt 388.0 lb

## 2014-11-13 DIAGNOSIS — G4733 Obstructive sleep apnea (adult) (pediatric): Secondary | ICD-10-CM | POA: Diagnosis not present

## 2014-11-13 DIAGNOSIS — R0600 Dyspnea, unspecified: Secondary | ICD-10-CM

## 2014-11-13 DIAGNOSIS — IMO0002 Reserved for concepts with insufficient information to code with codable children: Secondary | ICD-10-CM

## 2014-11-13 DIAGNOSIS — J690 Pneumonitis due to inhalation of food and vomit: Secondary | ICD-10-CM

## 2014-11-13 DIAGNOSIS — J4521 Mild intermittent asthma with (acute) exacerbation: Secondary | ICD-10-CM

## 2014-11-13 NOTE — Patient Instructions (Signed)
Order- New DME new CPAP auto 5-20 cwp, mask of choice, humidifier, supplies                          Dx OSA  Order- PFT   Dx recurrent bronchitis

## 2014-11-13 NOTE — Progress Notes (Signed)
11/13/14- 38 yoM never smoker seen to establish for pulmonary medicine care after bronchitis in Feb, 2016. SOB, prod. cough with green mucus. wants to know if he can get his CPAP machine had sleep study last year. Unattended home sleep study 08/26/13- AHI 17/ hr, weight 393 lbs CPAP was prescribed but he says he never went to pick it up.  Family is concerned about ongoing snoring and witnessed apneas. He is particularly concerned about recurrent bronchitis which she describes as a prolonged and "draining" experience once or twice a year, usually winter or early spring. Usually triggered by colds. History of congestive heart failure followed by cardiology. Pneumonia around a year or 2 ago. Seasonal allergic rhinitis with perennial nasal congestion and drainage. Aware of esophageal reflux. Works as a Midwife with little physical activity. Easily short of breath without change. He denies routine cough, phlegm, wheeze.  Prior to Admission medications   Medication Sig Start Date End Date Taking? Authorizing Provider  albuterol (PROVENTIL HFA;VENTOLIN HFA) 108 (90 BASE) MCG/ACT inhaler Inhale 2 puffs into the lungs every 6 (six) hours as needed for wheezing. 03/19/13  Yes [name], MD  amLODipine (NORVASC) 5 MG tablet Take 1 tablet (5 mg total) by mouth daily. 06/26/14  Yes Lewayne Bunting, MD  aspirin EC 81 MG tablet Take 81 mg by mouth daily.   Yes Historical Provider, MD  atorvastatin (LIPITOR) 20 MG tablet TAKE ONE TABLET BY MOUTH ONCE DAILY AT  6  PM 09/29/14  Yes [name], MD  carvedilol (COREG) 25 MG tablet TAKE TWO TABLETS BY MOUTH TWICE DAILY 08/15/14  Yes Lewayne Bunting, MD  chlorpheniramine-HYDROcodone (TUSSIONEX) 10-8 MG/5ML LQCR Take 5 mLs by mouth every 12 (twelve) hours as needed for cough. 08/29/14  Yes Charm Rings, MD  fexofenadine (ALLEGRA) 180 MG tablet Take 180 mg by mouth daily.   Yes Historical Provider, MD  FISH OIL-CHOLECALCIFEROL PO Take 1 capsule by mouth daily.     Yes  Historical Provider, MD  furosemide (LASIX) 20 MG tablet Take 2 tablets (40 mg total) by mouth daily. 08/15/14  Yes Lewayne Bunting, MD  Multiple Vitamins-Minerals (MULTIVITAMIN WITH MINERALS) tablet Take 1 tablet by mouth daily.     Yes Historical Provider, MD  pantoprazole (PROTONIX) 40 MG tablet TAKE ONE TABLET BY MOUTH ONCE DAILY   Yes [name], MD  valsartan (DIOVAN) 160 MG tablet Take 1 tablet (160 mg total) by mouth 2 (two) times daily. 08/15/14  Yes Lewayne Bunting, MD   Past Medical History  Diagnosis Date  . Hypertension   . CHF (congestive heart failure)   . ACHILLES TENDINITIS, BILATERAL 10/16/2008  . ALLERGIC RHINITIS 08/13/2009  . CARDIOMYOPATHY 04/21/2009  . CONGESTIVE HEART FAILURE 12/04/2007  . CORONARY ARTERY DISEASE 12/04/2007  . HYPERLIPIDEMIA 03/12/2007  . HYPERTENSION 03/12/2007  . OBESITY 04/21/2009  . SLEEP APNEA 03/12/2007  . Plantar fasciitis, bilateral 11/01/2011  . GERD (gastroesophageal reflux disease) 11/01/2011   Past Surgical History  Procedure Laterality Date  . No past surgeries     Family History  Problem Relation Age of Onset  . Asthma Brother   . Hypertension Other    History   Social History  . Marital Status: Married    Spouse Name: N/A  . Number of Children: 2  . Years of Education: N/A   Occupational History  . 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 Smoking status: Never Smoker   .  Smokeless tobacco: Never Used  . Alcohol Use: No  . Drug Use: No  . Sexual Activity: Not on file   Other Topics Concern  . Not on file   Social History Narrative   ROS-see HPI   Negative unless "+" Constitutional:    weight loss, night sweats, fevers, chills,+fatigue, lassitude. HEENT:    headaches, difficulty swallowing, tooth/dental problems, sore throat,       sneezing, itching, ear ache, nasal congestion, post nasal drip, snoring CV:    chest pain, orthopnea, PND, swelling in lower extremities, anasarca,                                   dizziness, palpitations Resp:   +shortness of breath with exertion or at rest.                productive cough,   non-productive cough, coughing up of blood.              change in color of mucus.  wheezing.   Skin:    rash or lesions. GI:  No-   heartburn, indigestion, abdominal pain, nausea, vomiting, diarrhea,                 change in bowel habits, loss of appetite GU: dysuria, change in color of urine, no urgency or frequency.   flank pain. MS:   joint pain, stiffness, decreased range of motion, back pain. Neuro-     nothing unusual Psych:  change in mood or affect.  depression or anxiety.   memory loss.  OBJ- Physical Exam General- Alert, Oriented, Affect-appropriate, Distress- none acute, + morbid                    obesity Skin- rash-none, lesions- none, excoriation- none Lymphadenopathy- none Head- atraumatic            Eyes- Gross vision intact, PERRLA, conjunctivae and secretions clear            Ears- Hearing, canals-normal            Nose- Clear, no-Septal dev, mucus, polyps, erosion, perforation             Throat- Mallampati II , mucosa clear , drainage- none, tonsils- atrophic Neck- flexible , trachea midline, no stridor , thyroid nl, carotid no bruit Chest - symmetrical excursion , unlabored           Heart/CV- RRR , no murmur , no gallop  , no rub, nl s1 s2                           - JVD- none , edema- none, stasis changes- none, varices- none           Lung- clear to P&A, wheeze- none, cough- none , dullness-none, rub- none           Chest wall-  Abd-  Br/ Gen/ Rectal- Not done, not indicated Extrem- cyanosis- none, clubbing, none, atrophy- none, strength- nl Neuro- grossly intact to observation    .

## 2014-11-14 ENCOUNTER — Encounter: Payer: Self-pay | Admitting: Internal Medicine

## 2014-11-14 DIAGNOSIS — J4521 Mild intermittent asthma with (acute) exacerbation: Secondary | ICD-10-CM | POA: Insufficient documentation

## 2014-11-14 NOTE — Assessment & Plan Note (Signed)
He never picked up CPAP originally but he is motivated now and symptoms strongly support ongoing diagnosis of significant obstructive sleep apnea complicated by his morbid obesity. Plan-ordering CPAP with auto titration

## 2014-11-14 NOTE — Assessment & Plan Note (Signed)
Not sure this will be the best diagnostic term for his pattern. He describes sustained viral triggered bronchitis once or twice a year. Plan-PFT

## 2014-11-14 NOTE — Assessment & Plan Note (Signed)
Impact of obesity on obstructive sleep apnea and his other medical problems was discussed. He may become a candidate for bariatric counseling

## 2014-11-14 NOTE — Assessment & Plan Note (Signed)
Morbid obesity and history of congestive heart failure. Never smoked. He may not have primary lung disease. Plan-PFT

## 2014-11-18 ENCOUNTER — Encounter: Payer: Self-pay | Admitting: Internal Medicine

## 2014-11-21 ENCOUNTER — Encounter: Payer: Self-pay | Admitting: Cardiology

## 2014-11-21 ENCOUNTER — Encounter: Payer: Self-pay | Admitting: Cardiovascular Disease

## 2014-11-24 ENCOUNTER — Telehealth (HOSPITAL_COMMUNITY): Payer: Self-pay | Admitting: *Deleted

## 2014-11-24 NOTE — Telephone Encounter (Signed)
Left message on voicemail in reference to upcoming appointment scheduled for 11/26/14. Phone number given for a call back so details instructions can be given. Tyler [name]   

## 2014-11-25 ENCOUNTER — Telehealth (HOSPITAL_COMMUNITY): Payer: Self-pay | Admitting: *Deleted

## 2014-11-25 NOTE — Telephone Encounter (Signed)
Patient given detailed instructions per Myocardial Perfusion Study Information Sheet for test on 11/26/14 at 0815. Patient verbalized understanding. Tyler [name], [name]

## 2014-11-26 ENCOUNTER — Ambulatory Visit (HOSPITAL_COMMUNITY): Payer: BLUE CROSS/BLUE SHIELD | Attending: Cardiology

## 2014-11-26 DIAGNOSIS — I517 Cardiomegaly: Secondary | ICD-10-CM | POA: Diagnosis not present

## 2014-11-26 DIAGNOSIS — I252 Old myocardial infarction: Secondary | ICD-10-CM | POA: Diagnosis not present

## 2014-11-26 DIAGNOSIS — R079 Chest pain, unspecified: Secondary | ICD-10-CM

## 2014-11-26 MED ORDER — TECHNETIUM TC 99M SESTAMIBI GENERIC - CARDIOLITE
33.0000 | Freq: Once | INTRAVENOUS | Status: AC | PRN
  Administered 2014-11-26: 33 via INTRAVENOUS

## 2014-11-26 MED ORDER — REGADENOSON 0.4 MG/5ML IV SOLN
0.4000 mg | Freq: Once | INTRAVENOUS | Status: AC
  Administered 2014-11-26: 0.4 mg via INTRAVENOUS

## 2014-11-27 ENCOUNTER — Ambulatory Visit (HOSPITAL_COMMUNITY): Payer: BLUE CROSS/BLUE SHIELD | Attending: Internal Medicine

## 2014-11-27 LAB — MYOCARDIAL PERFUSION IMAGING
Estimated workload: 10.4 METS
Exercise duration (min): 8 min
Exercise duration (sec): 7 s
LV dias vol: 288 mL
LV sys vol: 180 mL
MPHR: 131 {beats}/min
Nuc Stress EF: 37 %
Peak HR: 131 {beats}/min
Percent HR: 72 %
RATE: 0.26
RPE: 18
Rest HR: 58 {beats}/min
SDS: 2
SRS: 7
SSS: 9
TID: 1.1

## 2014-11-27 MED ORDER — TECHNETIUM TC 99M SESTAMIBI GENERIC - CARDIOLITE
33.0000 | Freq: Once | INTRAVENOUS | Status: AC | PRN
  Administered 2014-11-27: 33 via INTRAVENOUS

## 2014-12-02 ENCOUNTER — Encounter: Payer: Self-pay | Admitting: Cardiology

## 2014-12-02 ENCOUNTER — Telehealth: Payer: Self-pay | Admitting: *Deleted

## 2014-12-02 NOTE — Telephone Encounter (Signed)
Patient had A1C drawn on 09/03/2014

## 2014-12-02 NOTE — Telephone Encounter (Signed)
Pt returning call from yesterday,concerning his stress test results. Please call after 3:30 today.

## 2014-12-03 ENCOUNTER — Other Ambulatory Visit: Payer: Self-pay | Admitting: Internal Medicine

## 2014-12-03 NOTE — Telephone Encounter (Signed)
This encounter was created in error - please disregard.

## 2014-12-04 ENCOUNTER — Other Ambulatory Visit: Payer: Self-pay | Admitting: *Deleted

## 2014-12-04 MED ORDER — FUROSEMIDE 20 MG PO TABS: 40.0000 mg | ORAL_TABLET | Freq: Every day | ORAL | Status: DC

## 2014-12-10 ENCOUNTER — Ambulatory Visit (INDEPENDENT_AMBULATORY_CARE_PROVIDER_SITE_OTHER): Payer: BLUE CROSS/BLUE SHIELD | Admitting: Nurse Practitioner

## 2014-12-10 ENCOUNTER — Encounter: Payer: Self-pay | Admitting: Nurse Practitioner

## 2014-12-10 VITALS — BP 160/90 | HR 73 | Ht 69.0 in | Wt 391.8 lb

## 2014-12-10 DIAGNOSIS — E785 Hyperlipidemia, unspecified: Secondary | ICD-10-CM | POA: Diagnosis not present

## 2014-12-10 DIAGNOSIS — I5022 Chronic systolic (congestive) heart failure: Secondary | ICD-10-CM | POA: Diagnosis not present

## 2014-12-10 DIAGNOSIS — R9439 Abnormal result of other cardiovascular function study: Secondary | ICD-10-CM

## 2014-12-10 DIAGNOSIS — I10 Essential (primary) hypertension: Secondary | ICD-10-CM | POA: Diagnosis not present

## 2014-12-10 DIAGNOSIS — R079 Chest pain, unspecified: Secondary | ICD-10-CM

## 2014-12-10 MED ORDER — NITROGLYCERIN 0.4 MG SL SUBL: 0.4000 mg | SUBLINGUAL_TABLET | SUBLINGUAL | Status: AC | PRN

## 2014-12-10 NOTE — Patient Instructions (Addendum)
We will be checking the following labs today - NONE  Lab next Monday or Tuesday - BMET, CBC, PT, PTT, A1C   Medication Instructions:    Continue with your current medicines.  I have sent in a RX for NTG - Use your NTG under your tongue for recurrent chest pain. May take one tablet every 5 minutes. If you are still having discomfort after 3 tablets in 15 minutes, call 911.   Hold on exercise for now  Limit your activities   Testing/Procedures To Be Arranged:  Cardiac catheterization  Your provider has recommended a cardiac catherization  You are scheduled for a cardiac catheterization next Friday, July 1st at [facility] with Dr. Excell Seltzer or Katrinka Blazing or associate.  Go to [facility] 2[facility] Short Stay next Friday, July 1st at 7am.  Enter thru the [employer] entrance A No food or drink after midnight on Thursday, June 30th. You may take your medications with a sip of water on the day of your procedure.   Coronary Angiogram A coronary angiogram, also called coronary angiography, is an X-ray procedure used to look at the arteries in the heart. In this procedure, a dye (contrast dye) is injected through a long, hollow tube (catheter). The catheter is about the size of a piece of cooked spaghetti and is inserted through your groin, wrist, or arm. The dye is injected into each artery, and X-rays are then taken to show if there is a blockage in the arteries of your heart.  LET [facility] CARE PROVIDER KNOW ABOUT:  Any allergies you have, including allergies to shellfish or contrast dye.   All medicines you are taking, including vitamins, herbs, eye drops, creams, and over-the-counter medicines.   Previous problems you or members of your family have had with the use of anesthetics.   Any blood disorders you have.   Previous surgeries you have had.  History of kidney problems or failure.   Other medical conditions you have.  RISKS AND COMPLICATIONS  Generally, a coronary  angiogram is a safe procedure. However, about 1 person out of 1000 can have problems that may include:  Allergic reaction to the dye.  Bleeding/bruising from the access site or other locations.  Kidney injury, especially in people with impaired kidney function.  Stroke (rare).  Heart attack (rare).  Irregular rhythms (rare)  Death (rare)  BEFORE THE PROCEDURE   Do not eat or drink anything after midnight the night before the procedure or as directed by your health care provider.   Ask your health care provider about changing or stopping your regular medicines. This is especially important if you are taking diabetes medicines or blood thinners.  PROCEDURE  You may be given a medicine to help you relax (sedative) before the procedure. This medicine is given through an intravenous (IV) access tube that is inserted into one of your veins.   The area where the catheter will be inserted will be washed and shaved. This is usually done in the groin but may be done in the fold of your arm (near your elbow) or in the wrist.   A medicine will be given to numb the area where the catheter will be inserted (local anesthetic).   The health care provider will insert the catheter into an artery. The catheter will be guided by using a special type of X-ray (fluoroscopy) of the blood vessel being examined.   A special dye will then be injected into the catheter, and X-rays will be  taken. The dye will help to show where any narrowing or blockages are located in the heart arteries.    AFTER THE PROCEDURE   If the procedure is done through the leg, you will be kept in bed lying flat for several hours. You will be instructed to not bend or cross your legs.  The insertion site will be checked frequently.   The pulse in your feet or wrist will be checked frequently.   Additional blood tests, X-rays, and an electrocardiogram may be done.      Call the [facility] office at [phone] if you have any questions, problems or concerns.

## 2014-12-10 NOTE — Progress Notes (Signed)
CARDIOLOGY OFFICE NOTE  Date:  12/10/2014    [name] Date of Birth: 02-14-1976 Medical Record #[id number]  PCP:  Oliver Barre, MD  Cardiologist:  Jens Som    Chief Complaint  Patient presents with  . Abnormal Myoview    Seen Dr. Jens Som.     History of Present Illness: Tyler [name] is a 39 y.o. male who presents today for a pre cath visit. Seen for Dr. Jens Som. He has chronic systolic HF with NICM. His EF has recovered. Remote cath in 2006 with mild CAD. Other issues as noted below and include HTN, DM, HLD, morbid obesity, OSA (not on CPAP) and GERD.  Here a little over a month ago with chest pain with exertion - referred for Myoview - turned out to be high risk study.   Comes in today. Here alone. Morbidly obese. Just took his medicines before coming here today. BP is up. Works as a Midwife for [employer]. Says he is not aware that he is diabetic - but trying to "watch" his diet. On no DM meds. Weight is up. Some occasional chest pain. Feels like indigestion.  [facility]. Taking lots of supplements - told to hold off on for now. Planning on going on vacation for the weekend. Not short of breath. Has gotten his CPAP now and is trying to use. Considering bariatric surgery.   Past Medical History  Diagnosis Date  . Hypertension   . CHF (congestive heart failure)   . ACHILLES TENDINITIS, BILATERAL 10/16/2008  . ALLERGIC RHINITIS 08/13/2009  . CARDIOMYOPATHY 04/21/2009  . CONGESTIVE HEART FAILURE 12/04/2007  . CORONARY ARTERY DISEASE 12/04/2007  . HYPERLIPIDEMIA 03/12/2007  . HYPERTENSION 03/12/2007  . OBESITY 04/21/2009  . SLEEP APNEA 03/12/2007  . Plantar fasciitis, bilateral 11/01/2011  . GERD (gastroesophageal reflux disease) 11/01/2011    Past Surgical History  Procedure Laterality Date  . No past surgeries       Medications: Current Outpatient Prescriptions  Medication Sig Dispense Refill  . albuterol (PROVENTIL  HFA;VENTOLIN HFA) 108 (90 BASE) MCG/ACT inhaler Inhale 2 puffs into the lungs every 6 (six) hours as needed for wheezing. 1 Inhaler 11  . amLODipine (NORVASC) 5 MG tablet Take 1 tablet (5 mg total) by mouth daily. 90 tablet 1  . aspirin EC 81 MG tablet Take 81 mg by mouth daily.    Marland Kitchen atorvastatin (LIPITOR) 20 MG tablet TAKE ONE TABLET BY MOUTH ONCE DAILY AT  6  PM 90 tablet 2  . carvedilol (COREG) 25 MG tablet TAKE TWO TABLETS BY MOUTH TWICE DAILY 360 tablet 1  . chlorpheniramine-HYDROcodone (TUSSIONEX) 10-8 MG/5ML LQCR Take 5 mLs by mouth every 12 (twelve) hours as needed for cough. 115 mL 0  . fexofenadine (ALLEGRA) 180 MG tablet Take 180 mg by mouth daily.    Marland Kitchen FISH OIL-CHOLECALCIFEROL PO Take 1 capsule by mouth daily.      . furosemide (LASIX) 20 MG tablet Take 2 tablets (40 mg total) by mouth daily. 180 tablet 0  . Multiple Vitamins-Minerals (MULTIVITAMIN WITH MINERALS) tablet Take 1 tablet by mouth daily.      . pantoprazole (PROTONIX) 40 MG tablet TAKE ONE TABLET BY MOUTH ONCE DAILY 90 tablet 1  . valsartan (DIOVAN) 160 MG tablet Take 1 tablet (160 mg total) by mouth 2 (two) times daily. 180 tablet 2   No current facility-administered medications for this visit.    Allergies: No  Known Allergies  Social History: The patient  reports that he has never smoked. He has never used smokeless tobacco. He reports that he does not drink alcohol or use illicit drugs.   Family History: The patient's family history includes Asthma in his brother; Hypertension in his other.   Review of Systems: Please see the history of present illness.   Otherwise, the review of systems is positive for none.   All other systems are reviewed and negative.   Physical Exam: VS:  BP 160/90 mmHg  Pulse 73  Ht 5\' 9"  (1.753 m)  Wt 391 lb 12.8 oz (177.719 kg)  BMI 57.83 kg/m2  SpO2 95% .  BMI Body mass index is 57.83 kg/(m^2).  Wt Readings from Last 3 Encounters:  12/10/14 391 lb 12.8 oz (177.719 kg)    11/26/14 386 lb (175.088 kg)  11/13/14 388 lb (175.996 kg)    General: Morbidly obese but in no acute distress.  HEENT: Normal. Neck: Supple, no JVD, carotid bruits, or masses noted.  Cardiac: Regular rate and rhythm. No murmurs, rubs, or gallops. No edema.  Respiratory:  Lungs are clear to auscultation bilaterally with normal work of breathing.  GI: Soft and nontender.  MS: No deformity or atrophy. Gait and ROM intact. Skin: Warm and dry. Color is normal.  Neuro:  Strength and sensation are intact and no gross focal deficits noted.  Psych: Alert, appropriate and with normal affect.   LABORATORY DATA:  EKG:  EKG is not ordered today.   Lab Results  Component Value Date   WBC 8.1 02/11/2014   HGB 15.3 08/31/2014   HCT 45.0 08/31/2014   PLT 233.0 02/11/2014   GLUCOSE 211* 09/03/2014   CHOL 134 09/03/2014   TRIG 171.0* 09/03/2014   HDL 28.50* 09/03/2014   LDLDIRECT 183.6 08/02/2011   LDLCALC 71 09/03/2014   ALT 29 09/03/2014   AST 16 09/03/2014   NA 138 09/03/2014   K 3.3* 09/03/2014   CL 104 09/03/2014   CREATININE 1.10 09/03/2014   BUN 17 09/03/2014   CO2 30 09/03/2014   TSH 2.38 02/11/2014   PSA 0.60 02/11/2014   INR 0.93 02/08/2013   HGBA1C 8.3* 09/03/2014   MICROALBUR 3.3* 02/11/2014    BNP (last 3 results) No results for input(s): BNP in the last 8760 hours.  ProBNP (last 3 results) No results for input(s): PROBNP in the last 8760 hours.   Other Studies Reviewed Today:   Myoview Interpretation Summary     This is a high risk study with an old infarct in the basal and mid inferior and inferolateral walls with periinfarct ischemia in the apical inferior and lateral walls (SDS 4).  Left ventricle is dilated with moderately impaired LVEF 37% and akinesis of the basal and mid inferior and inferolateral walls.    Cardiac Cath from 2006 CORONARIES: The left main was normal.  The LAD was large wrapping the apex. It was normal throughout its  course. There was moderate size first and second diagonal which was normal.  The circumflex in the mid AV groove had 25% stenosis after a large mid obtuse marginal. First obtuse marginal was large and branching and normal. The second obtuse marginal was small to moderate sized and normal.  The right coronary artery was a dominant vessel. There was diffuse 25-30% plaque throughout the course of this vessel. The PDA was moderate in size with diffuse 25-30% plaque.  LEFT VENTRICULOGRAM: The left ventriculogram was obtained in the RAO projection. EF was  20% with global hypokinesis.  CONCLUSION: Nonischemic cardiomyopathy with nonobstructive coronary plaque.  PLAN: The patient will be managed medically for his nonischemic cardiomyopathy.   ___________________________________________ Rollene Rotunda, M.D.   JH/MEDQ D: 10/07/2004 T: 10/07/2004 Job: [id number]  Echo Study Conclusions from 07/2013  - Left ventricle: The cavity size was normal. There was moderate focal basal and mild concentric hypertrophy. Systolic function was normal. The estimated ejection fraction was in the range of 50% to 55%. Wall motion was normal; there were no regional wall motion abnormalities. There was an increased relative contribution of atrial contraction to ventricular filling. Doppler parameters are consistent with abnormal left ventricular relaxation (grade 1 diastolic dysfunction). - Aortic valve: Mild regurgitation. - Atrial septum: A patent foramen ovale cannot be excluded. Recommendations: Consider study if clinically indicated with agitated saline contrast administration.   Assessment/Plan:  1. Chest pain - abnormal Myoview - arranging cardiac cath for next week. The patient understands that risks include but are not limited to stroke (1 in 1000), death (1 in 1000), kidney failure [usually temporary] (1 in 500), bleeding (1 in 200), allergic  reaction [possibly serious] (1 in 200), and agrees to proceed.   2. NICM - EF had recovered from last echo but now back at 37% per Myoview. No real symptoms and appears compensated.   3. Abnormal Myoview - high risk study noted with drop in EF  4. HTN - BP is up today - he has just had his medicines.   5.  HLD - on statin  6. Morbid obesity  7. Diabetes - explained that he has this diagnosis and the importance of treatment   Current medicines are reviewed with the patient today.  The patient does not have concerns regarding medicines other than what has been noted above.  The following changes have been made:  See above.  Labs/ tests ordered today include:   No orders of the defined types were placed in this encounter.     Disposition:   Further disposition to follow.   Patient is agreeable to this plan and will call if any problems develop in the interim.   Signed: Rosalio Macadamia, RN, ANP-C 12/10/2014 2:10 PM  [facility] [address] [place], [place]  [address] Phone: [phone] Fax: [phone]

## 2014-12-15 ENCOUNTER — Other Ambulatory Visit (INDEPENDENT_AMBULATORY_CARE_PROVIDER_SITE_OTHER): Payer: BLUE CROSS/BLUE SHIELD | Admitting: *Deleted

## 2014-12-15 DIAGNOSIS — I5022 Chronic systolic (congestive) heart failure: Secondary | ICD-10-CM | POA: Diagnosis not present

## 2014-12-15 DIAGNOSIS — R9439 Abnormal result of other cardiovascular function study: Secondary | ICD-10-CM

## 2014-12-15 DIAGNOSIS — E785 Hyperlipidemia, unspecified: Secondary | ICD-10-CM

## 2014-12-15 DIAGNOSIS — R079 Chest pain, unspecified: Secondary | ICD-10-CM

## 2014-12-15 DIAGNOSIS — I10 Essential (primary) hypertension: Secondary | ICD-10-CM

## 2014-12-16 LAB — CBC
HCT: 41.2 % (ref 39.0–52.0)
Hemoglobin: 13.6 g/dL (ref 13.0–17.0)
MCHC: 32.9 g/dL (ref 30.0–36.0)
MCV: 87.1 fl (ref 78.0–100.0)
Platelets: 224 10*3/uL (ref 150.0–400.0)
RBC: 4.73 Mil/uL (ref 4.22–5.81)
RDW: 14.6 % (ref 11.5–15.5)
WBC: 7.9 10*3/uL (ref 4.0–10.5)

## 2014-12-16 LAB — BASIC METABOLIC PANEL
BUN: 16 mg/dL (ref 6–23)
CO2: 26 mEq/L (ref 19–32)
Calcium: 9.1 mg/dL (ref 8.4–10.5)
Chloride: 104 mEq/L (ref 96–112)
Creatinine, Ser: 1.24 mg/dL (ref 0.40–1.50)
GFR: 82.61 mL/min (ref >60.00)
Glucose, Bld: 91 mg/dL (ref 70–99)
Potassium: 3.9 mEq/L (ref 3.5–5.1)
Sodium: 141 mEq/L (ref 135–145)

## 2014-12-16 LAB — APTT: aPTT: 36.5 s — ABNORMAL HIGH (ref 23.4–32.7)

## 2014-12-16 LAB — PROTIME-INR
INR: 1.2 ratio — ABNORMAL HIGH (ref 0.8–1.0)
Prothrombin Time: 13.3 s — ABNORMAL HIGH (ref 9.6–13.1)

## 2014-12-16 LAB — HEMOGLOBIN A1C: Hgb A1c MFr Bld: 6.7 % — ABNORMAL HIGH (ref 4.6–6.5)

## 2014-12-17 ENCOUNTER — Telehealth: Payer: Self-pay | Admitting: Nurse Practitioner

## 2014-12-17 ENCOUNTER — Ambulatory Visit (INDEPENDENT_AMBULATORY_CARE_PROVIDER_SITE_OTHER): Payer: BLUE CROSS/BLUE SHIELD | Admitting: Internal Medicine

## 2014-12-17 DIAGNOSIS — J690 Pneumonitis due to inhalation of food and vomit: Secondary | ICD-10-CM | POA: Diagnosis not present

## 2014-12-17 DIAGNOSIS — IMO0002 Reserved for concepts with insufficient information to code with codable children: Secondary | ICD-10-CM

## 2014-12-17 LAB — PULMONARY FUNCTION TEST
DL/VA % pred: 128 %
DL/VA: 5.91 ml/min/mmHg/L
DLCO unc % pred: 109 %
DLCO unc: 33.9 ml/min/mmHg
FEF 25-75 Post: 3.81 L/sec
FEF 25-75 Pre: 2.97 L/sec
FEF2575-%Change-Post: 28 %
FEF2575-%Pred-Post: 105 %
FEF2575-%Pred-Pre: 82 %
FEV1-%Change-Post: 3 %
FEV1-%Pred-Post: 90 %
FEV1-%Pred-Pre: 87 %
FEV1-Post: 3.14 L
FEV1-Pre: 3.03 L
FEV1FVC-%Change-Post: -2 %
FEV1FVC-%Pred-Pre: 99 %
FEV6-%Change-Post: 7 %
FEV6-%Pred-Post: 96 %
FEV6-%Pred-Pre: 89 %
FEV6-Post: 3.96 L
FEV6-Pre: 3.7 L
FEV6FVC-%Change-Post: 0 %
FEV6FVC-%Pred-Post: 102 %
FEV6FVC-%Pred-Pre: 101 %
FVC-%Change-Post: 6 %
FVC-%Pred-Post: 93 %
FVC-%Pred-Pre: 88 %
FVC-Post: 3.96 L
FVC-Pre: 3.71 L
Post FEV1/FVC ratio: 79 %
Post FEV6/FVC ratio: 100 %
Pre FEV1/FVC ratio: 82 %
Pre FEV6/FVC Ratio: 100 %
RV % pred: 101 %
RV: 1.83 L
TLC % pred: 81 %
TLC: 5.45 L

## 2014-12-17 NOTE — Telephone Encounter (Signed)
Called patient about lab results. Per Norma Fredrickson NP, labs are stable for cardiac cath. Hgb A1C is better, but still elevated-indicates you do have diabetes. Patient verbalized understanding.

## 2014-12-17 NOTE — Progress Notes (Signed)
PFT done today. 

## 2014-12-17 NOTE — Telephone Encounter (Signed)
New message      Pt returning call from office.  Please advise

## 2014-12-19 ENCOUNTER — Ambulatory Visit (HOSPITAL_COMMUNITY)
Admission: RE | Admit: 2014-12-19 | Discharge: 2014-12-19 | Disposition: A | Discharge status: Home / Self Care | Payer: BLUE CROSS/BLUE SHIELD | Source: Ambulatory Visit | Attending: Interventional Cardiology | Admitting: Interventional Cardiology

## 2014-12-19 ENCOUNTER — Encounter (HOSPITAL_COMMUNITY): Payer: Self-pay | Admitting: Interventional Cardiology

## 2014-12-19 ENCOUNTER — Encounter (HOSPITAL_COMMUNITY)
Admission: RE | Disposition: A | Discharge status: Home / Self Care | Payer: Self-pay | Source: Ambulatory Visit | Attending: Interventional Cardiology

## 2014-12-19 DIAGNOSIS — K219 Gastro-esophageal reflux disease without esophagitis: Secondary | ICD-10-CM | POA: Insufficient documentation

## 2014-12-19 DIAGNOSIS — Z7982 Long term (current) use of aspirin: Secondary | ICD-10-CM | POA: Diagnosis not present

## 2014-12-19 DIAGNOSIS — I429 Cardiomyopathy, unspecified: Secondary | ICD-10-CM | POA: Insufficient documentation

## 2014-12-19 DIAGNOSIS — E119 Type 2 diabetes mellitus without complications: Secondary | ICD-10-CM | POA: Insufficient documentation

## 2014-12-19 DIAGNOSIS — E785 Hyperlipidemia, unspecified: Secondary | ICD-10-CM | POA: Diagnosis present

## 2014-12-19 DIAGNOSIS — G4733 Obstructive sleep apnea (adult) (pediatric): Secondary | ICD-10-CM | POA: Diagnosis not present

## 2014-12-19 DIAGNOSIS — Z6841 Body Mass Index (BMI) 40.0 and over, adult: Secondary | ICD-10-CM | POA: Insufficient documentation

## 2014-12-19 DIAGNOSIS — I2510 Atherosclerotic heart disease of native coronary artery without angina pectoris: Secondary | ICD-10-CM | POA: Diagnosis present

## 2014-12-19 DIAGNOSIS — I25111 Atherosclerotic heart disease of native coronary artery with angina pectoris with documented spasm: Secondary | ICD-10-CM

## 2014-12-19 DIAGNOSIS — R9439 Abnormal result of other cardiovascular function study: Secondary | ICD-10-CM

## 2014-12-19 DIAGNOSIS — I10 Essential (primary) hypertension: Secondary | ICD-10-CM | POA: Insufficient documentation

## 2014-12-19 DIAGNOSIS — Z79899 Other long term (current) drug therapy: Secondary | ICD-10-CM | POA: Diagnosis not present

## 2014-12-19 DIAGNOSIS — R931 Abnormal findings on diagnostic imaging of heart and coronary circulation: Secondary | ICD-10-CM | POA: Diagnosis present

## 2014-12-19 DIAGNOSIS — I5022 Chronic systolic (congestive) heart failure: Secondary | ICD-10-CM | POA: Insufficient documentation

## 2014-12-19 HISTORY — PX: CARDIAC CATHETERIZATION: SHX172

## 2014-12-19 SURGERY — LEFT HEART CATH AND CORONARY ANGIOGRAPHY
Anesthesia: LOCAL

## 2014-12-19 MED ORDER — HEPARIN SODIUM (PORCINE) 1000 UNIT/ML IJ SOLN
INTRAMUSCULAR | Status: AC
  Filled 2014-12-19: qty 1

## 2014-12-19 MED ORDER — FENTANYL CITRATE (PF) 100 MCG/2ML IJ SOLN
INTRAMUSCULAR | Status: AC
  Filled 2014-12-19: qty 2

## 2014-12-19 MED ORDER — SODIUM CHLORIDE 0.9 % IV SOLN
INTRAVENOUS | Status: DC
  Administered 2014-12-19: 08:00:00 via INTRAVENOUS

## 2014-12-19 MED ORDER — HEPARIN (PORCINE) IN NACL 2-0.9 UNIT/ML-% IJ SOLN
INTRAMUSCULAR | Status: AC
  Filled 2014-12-19: qty 1000

## 2014-12-19 MED ORDER — SODIUM CHLORIDE 0.9 % IJ SOLN: 3.0000 mL | Freq: Two times a day (BID) | INTRAMUSCULAR | Status: DC

## 2014-12-19 MED ORDER — LIDOCAINE HCL (PF) 1 % IJ SOLN
INTRAMUSCULAR | Status: AC
  Filled 2014-12-19: qty 30

## 2014-12-19 MED ORDER — ACETAMINOPHEN 325 MG PO TABS: 650.0000 mg | ORAL_TABLET | ORAL | Status: DC | PRN

## 2014-12-19 MED ORDER — IOHEXOL 350 MG/ML SOLN
INTRAVENOUS | Status: DC | PRN
  Administered 2014-12-19: 180 mL via INTRAVENOUS

## 2014-12-19 MED ORDER — VERAPAMIL HCL 2.5 MG/ML IV SOLN
INTRAVENOUS | Status: AC
Start: 2014-12-19 — End: 2014-12-19
  Filled 2014-12-19: qty 2

## 2014-12-19 MED ORDER — SODIUM CHLORIDE 0.9 % IJ SOLN: 3.0000 mL | INTRAMUSCULAR | Status: DC | PRN

## 2014-12-19 MED ORDER — SODIUM CHLORIDE 0.9 % IV SOLN: 250.0000 mL | INTRAVENOUS | Status: DC | PRN

## 2014-12-19 MED ORDER — VERAPAMIL HCL 2.5 MG/ML IV SOLN
INTRAVENOUS | Status: DC | PRN
  Administered 2014-12-19: 10:00:00 via INTRA_ARTERIAL

## 2014-12-19 MED ORDER — NITROGLYCERIN 1 MG/10 ML FOR IR/CATH LAB
INTRA_ARTERIAL | Status: DC | PRN
  Administered 2014-12-19: 10:00:00

## 2014-12-19 MED ORDER — ONDANSETRON HCL 4 MG/2ML IJ SOLN: 4.0000 mg | Freq: Four times a day (QID) | INTRAMUSCULAR | Status: DC | PRN

## 2014-12-19 MED ORDER — MIDAZOLAM HCL 2 MG/2ML IJ SOLN
INTRAMUSCULAR | Status: AC
  Filled 2014-12-19: qty 2

## 2014-12-19 MED ORDER — MIDAZOLAM HCL 2 MG/2ML IJ SOLN
INTRAMUSCULAR | Status: DC | PRN
  Administered 2014-12-19 (×2): 1 mg via INTRAVENOUS

## 2014-12-19 MED ORDER — ASPIRIN 81 MG PO CHEW: 81.0000 mg | CHEWABLE_TABLET | ORAL | Status: DC

## 2014-12-19 MED ORDER — FENTANYL CITRATE (PF) 100 MCG/2ML IJ SOLN
INTRAMUSCULAR | Status: DC | PRN
  Administered 2014-12-19 (×2): 50 ug via INTRAVENOUS

## 2014-12-19 MED ORDER — SODIUM CHLORIDE 0.9 % WEIGHT BASED INFUSION: 3.0000 mL/kg/h | INTRAVENOUS | Status: AC

## 2014-12-19 MED ORDER — HEPARIN SODIUM (PORCINE) 1000 UNIT/ML IJ SOLN
INTRAMUSCULAR | Status: DC | PRN
  Administered 2014-12-19: 8000 [IU] via INTRAVENOUS

## 2014-12-19 SURGICAL SUPPLY — 14 items
CATH INFINITI 5 FR JL3.5 (CATHETERS) ×2 IMPLANT
CATH INFINITI 5FR ANG PIGTAIL (CATHETERS) ×1 IMPLANT
CATH INFINITI JR4 5F (CATHETERS) ×2 IMPLANT
CATH SITESEER 5F MULTI A 2 (CATHETERS) IMPLANT
DEVICE RAD COMP TR BAND LRG (VASCULAR PRODUCTS) ×2 IMPLANT
GLIDESHEATH SLEND A-KIT 6F 22G (SHEATH) ×2 IMPLANT
HOVERMATT SINGLE USE (MISCELLANEOUS) ×1 IMPLANT
KIT HEART LEFT (KITS) ×2 IMPLANT
PACK CARDIAC CATHETERIZATION (CUSTOM PROCEDURE TRAY) ×2 IMPLANT
SHEATH PINNACLE 5F 10CM (SHEATH) IMPLANT
TRANSDUCER W/STOPCOCK (MISCELLANEOUS) ×2 IMPLANT
TUBING CIL FLEX 10 FLL-RA (TUBING) ×2 IMPLANT
WIRE EMERALD 3MM-J .035X150CM (WIRE) IMPLANT
WIRE SAFE-T 1.5MM-J .035X260CM (WIRE) ×2 IMPLANT

## 2014-12-19 NOTE — Research (Signed)
CAD LAD Informed Consent   Subject Name: Tyler [name]  Subject met inclusion and exclusion criteria.  The informed consent form, study requirements and expectations were reviewed with the subject and questions and concerns were addressed prior to the signing of the consent form.  The subject verbalized understanding of the trail requirements.  The subject agreed to participate in the CAD LAD trial and signed the informed consent.  The informed consent was obtained prior to performance of any protocol-specific procedures for the subject.  A copy of the signed informed consent was given to the subject and a copy was placed in the subject's medical record.  Vi Whitesel/Hugh Pruitt 12/19/2014, 08:10 am

## 2014-12-19 NOTE — H&P (View-Only) (Signed)
CARDIOLOGY OFFICE NOTE  Date:  12/10/2014    Tyler [name] Date of Birth: 02-14-1976 Medical Record #[id number]  PCP:  Oliver Barre, MD  Cardiologist:  Jens Som    Chief Complaint  Patient presents with  . Abnormal Myoview    Seen Dr. Jens Som.     History of Present Illness: Tyler [name] is a 39 y.o. male who presents today for a pre cath visit. Seen for Dr. Jens Som. He has chronic systolic HF with NICM. His EF has recovered. Remote cath in 2006 with mild CAD. Other issues as noted below and include HTN, DM, HLD, morbid obesity, OSA (not on CPAP) and GERD.  Here a little over a month ago with chest pain with exertion - referred for Myoview - turned out to be high risk study.   Comes in today. Here alone. Morbidly obese. Just took his medicines before coming here today. BP is up. Works as a Midwife for [employer]. Says he is not aware that he is diabetic - but trying to "watch" his diet. On no DM meds. Weight is up. Some occasional chest pain. Feels like indigestion.  [facility] Taking lots of supplements - told to hold off on for now. Planning on going on vacation for the weekend. Not short of breath. Has gotten his CPAP now and is trying to use. Considering bariatric surgery.   Past Medical History  Diagnosis Date  . Hypertension   . CHF (congestive heart failure)   . ACHILLES TENDINITIS, BILATERAL 10/16/2008  . ALLERGIC RHINITIS 08/13/2009  . CARDIOMYOPATHY 04/21/2009  . CONGESTIVE HEART FAILURE 12/04/2007  . CORONARY ARTERY DISEASE 12/04/2007  . HYPERLIPIDEMIA 03/12/2007  . HYPERTENSION 03/12/2007  . OBESITY 04/21/2009  . SLEEP APNEA 03/12/2007  . Plantar fasciitis, bilateral 11/01/2011  . GERD (gastroesophageal reflux disease) 11/01/2011    Past Surgical History  Procedure Laterality Date  . No past surgeries       Medications: Current Outpatient Prescriptions  Medication Sig Dispense Refill  . albuterol (PROVENTIL  HFA;VENTOLIN HFA) 108 (90 BASE) MCG/ACT inhaler Inhale 2 puffs into the lungs every 6 (six) hours as needed for wheezing. 1 Inhaler 11  . amLODipine (NORVASC) 5 MG tablet Take 1 tablet (5 mg total) by mouth daily. 90 tablet 1  . aspirin EC 81 MG tablet Take 81 mg by mouth daily.    Marland Kitchen atorvastatin (LIPITOR) 20 MG tablet TAKE ONE TABLET BY MOUTH ONCE DAILY AT  6  PM 90 tablet 2  . carvedilol (COREG) 25 MG tablet TAKE TWO TABLETS BY MOUTH TWICE DAILY 360 tablet 1  . chlorpheniramine-HYDROcodone (TUSSIONEX) 10-8 MG/5ML LQCR Take 5 mLs by mouth every 12 (twelve) hours as needed for cough. 115 mL 0  . fexofenadine (ALLEGRA) 180 MG tablet Take 180 mg by mouth daily.    Marland Kitchen FISH OIL-CHOLECALCIFEROL PO Take 1 capsule by mouth daily.      . furosemide (LASIX) 20 MG tablet Take 2 tablets (40 mg total) by mouth daily. 180 tablet 0  . Multiple Vitamins-Minerals (MULTIVITAMIN WITH MINERALS) tablet Take 1 tablet by mouth daily.      . pantoprazole (PROTONIX) 40 MG tablet TAKE ONE TABLET BY MOUTH ONCE DAILY 90 tablet 1  . valsartan (DIOVAN) 160 MG tablet Take 1 tablet (160 mg total) by mouth 2 (two) times daily. 180 tablet 2   No current facility-administered medications for this visit.    Allergies: No  Known Allergies  Social History: The patient  reports that he has never smoked. He has never used smokeless tobacco. He reports that he does not drink alcohol or use illicit drugs.   Family History: The patient's family history includes Asthma in his brother; Hypertension in his other.   Review of Systems: Please see the history of present illness.   Otherwise, the review of systems is positive for none.   All other systems are reviewed and negative.   Physical Exam: VS:  BP 160/90 mmHg  Pulse 73  Ht 5' 9" (1.753 m)  Wt 391 lb 12.8 oz (177.719 kg)  BMI 57.83 kg/m2  SpO2 95% .  BMI Body mass index is 57.83 kg/(m^2).  Wt Readings from Last 3 Encounters:  12/10/14 391 lb 12.8 oz (177.719 kg)    11/26/14 386 lb (175.088 kg)  11/13/14 388 lb (175.996 kg)    General: Morbidly obese but in no acute distress.  HEENT: Normal. Neck: Supple, no JVD, carotid bruits, or masses noted.  Cardiac: Regular rate and rhythm. No murmurs, rubs, or gallops. No edema.  Respiratory:  Lungs are clear to auscultation bilaterally with normal work of breathing.  GI: Soft and nontender.  MS: No deformity or atrophy. Gait and ROM intact. Skin: Warm and dry. Color is normal.  Neuro:  Strength and sensation are intact and no gross focal deficits noted.  Psych: Alert, appropriate and with normal affect.   LABORATORY DATA:  EKG:  EKG is not ordered today.   Lab Results  Component Value Date   WBC 8.1 02/11/2014   HGB 15.3 08/31/2014   HCT 45.0 08/31/2014   PLT 233.0 02/11/2014   GLUCOSE 211* 09/03/2014   CHOL 134 09/03/2014   TRIG 171.0* 09/03/2014   HDL 28.50* 09/03/2014   LDLDIRECT 183.6 08/02/2011   LDLCALC 71 09/03/2014   ALT 29 09/03/2014   AST 16 09/03/2014   NA 138 09/03/2014   K 3.3* 09/03/2014   CL 104 09/03/2014   CREATININE 1.10 09/03/2014   BUN 17 09/03/2014   CO2 30 09/03/2014   TSH 2.38 02/11/2014   PSA 0.60 02/11/2014   INR 0.93 02/08/2013   HGBA1C 8.3* 09/03/2014   MICROALBUR 3.3* 02/11/2014    BNP (last 3 results) No results for input(s): BNP in the last 8760 hours.  ProBNP (last 3 results) No results for input(s): PROBNP in the last 8760 hours.   Other Studies Reviewed Today:   Myoview Interpretation Summary     This is a high risk study with an old infarct in the basal and mid inferior and inferolateral walls with periinfarct ischemia in the apical inferior and lateral walls (SDS 4).  Left ventricle is dilated with moderately impaired LVEF 37% and akinesis of the basal and mid inferior and inferolateral walls.    Cardiac Cath from 2006 CORONARIES: The left main was normal.  The LAD was large wrapping the apex. It was normal throughout its  course. There was moderate size first and second diagonal which was normal.  The circumflex in the mid AV groove had 25% stenosis after a large mid obtuse marginal. First obtuse marginal was large and branching and normal. The second obtuse marginal was small to moderate sized and normal.  The right coronary artery was a dominant vessel. There was diffuse 25-30% plaque throughout the course of this vessel. The PDA was moderate in size with diffuse 25-30% plaque.  LEFT VENTRICULOGRAM: The left ventriculogram was obtained in the RAO projection. EF was   20% with global hypokinesis.  CONCLUSION: Nonischemic cardiomyopathy with nonobstructive coronary plaque.  PLAN: The patient will be managed medically for his nonischemic cardiomyopathy.   ___________________________________________ James Hochrein, M.D.   JH/MEDQ D: 10/07/2004 T: 10/07/2004 Job: [id number]  Echo Study Conclusions from 07/2013  - Left ventricle: The cavity size was normal. There was moderate focal basal and mild concentric hypertrophy. Systolic function was normal. The estimated ejection fraction was in the range of 50% to 55%. Wall motion was normal; there were no regional wall motion abnormalities. There was an increased relative contribution of atrial contraction to ventricular filling. Doppler parameters are consistent with abnormal left ventricular relaxation (grade 1 diastolic dysfunction). - Aortic valve: Mild regurgitation. - Atrial septum: A patent foramen ovale cannot be excluded. Recommendations: Consider study if clinically indicated with agitated saline contrast administration.   Assessment/Plan:  1. Chest pain - abnormal Myoview - arranging cardiac cath for next week. The patient understands that risks include but are not limited to stroke (1 in 1000), death (1 in 1000), kidney failure [usually temporary] (1 in 500), bleeding (1 in 200), allergic  reaction [possibly serious] (1 in 200), and agrees to proceed.   2. NICM - EF had recovered from last echo but now back at 37% per Myoview. No real symptoms and appears compensated.   3. Abnormal Myoview - high risk study noted with drop in EF  4. HTN - BP is up today - he has just had his medicines.   5.  HLD - on statin  6. Morbid obesity  7. Diabetes - explained that he has this diagnosis and the importance of treatment   Current medicines are reviewed with the patient today.  The patient does not have concerns regarding medicines other than what has been noted above.  The following changes have been made:  See above.  Labs/ tests ordered today include:   No orders of the defined types were placed in this encounter.     Disposition:   Further disposition to follow.   Patient is agreeable to this plan and will call if any problems develop in the interim.   Signed: Srah Ake C. Omkar Stratmann, RN, ANP-C 12/10/2014 2:10 PM  [facility] [address] [place], [place]  [address] Phone: [phone] Fax: [phone]         

## 2014-12-19 NOTE — Progress Notes (Signed)
Right radial site stable with occlusive dressing.  Pt and wife verbalized understanding of discharge instructions and restrictions.  Ready for discharge

## 2014-12-19 NOTE — Discharge Instructions (Signed)
Radial Site Care °Refer to this sheet in the next few weeks. These instructions provide you with information on caring for yourself after your procedure. Your caregiver may also give you more specific instructions. Your treatment has been planned according to current medical practices, but problems sometimes occur. Call your caregiver if you have any problems or questions after your procedure. °HOME CARE INSTRUCTIONS °· You may shower the day after the procedure. Remove the bandage (dressing) and gently wash the site with plain soap and water. Gently pat the site dry. °· Do not apply powder or lotion to the site. °· Do not submerge the affected site in water for 3 to 5 days. °· Inspect the site at least twice daily. °· Do not flex or bend the affected arm for 24 hours. °· No lifting over 5 pounds (2.3 kg) for 5 days after your procedure. °· Do not drive home if you are discharged the same day of the procedure. Have someone else drive you. °· You may drive 24 hours after the procedure unless otherwise instructed by your caregiver. °· Do not operate machinery or power tools for 24 hours. °· A responsible adult should be with you for the first 24 hours after you arrive home. °What to expect: °· Any bruising will usually fade within 1 to 2 weeks. °· Blood that collects in the tissue (hematoma) may be painful to the touch. It should usually decrease in size and tenderness within 1 to 2 weeks. °SEEK IMMEDIATE MEDICAL CARE IF: °· You have unusual pain at the radial site. °· You have redness, warmth, swelling, or pain at the radial site. °· You have drainage (other than a small amount of blood on the dressing). °· You have chills. °· You have a fever or persistent symptoms for more than 72 hours. °· You have a fever and your symptoms suddenly get worse. °· Your arm becomes pale, cool, tingly, or numb. °· You have heavy bleeding from the site. Hold pressure on the site. °Document Released: 07/09/2010 Document Revised:  08/29/2011 Document Reviewed: 07/09/2010 °ExitCare® Patient Information ©2015 [employer]. This information is not intended to replace advice given to you by your health care provider. Make sure you discuss any questions you have with your health care provider. ° °

## 2014-12-19 NOTE — Interval H&P Note (Signed)
Cath Lab Visit (complete for each Cath Lab visit)  Clinical Evaluation Leading to the Procedure:   ACS: No.  Non-ACS:    Anginal Classification: CCS III  Anti-ischemic medical therapy: Maximal Therapy (2 or more classes of medications)  Non-Invasive Test Results: High-risk stress test findings: cardiac mortality >3%/year  Prior CABG: No previous CABG      History and Physical Interval Note:  12/19/2014 9:13 AM  Tyler [name]  has presented today for surgery, with the diagnosis of cp, abnornal myoview  The various methods of treatment have been discussed with the patient and family. After consideration of risks, benefits and other options for treatment, the patient has consented to  Procedure(s): Left Heart Cath and Coronary Angiography (N/A) as a surgical intervention .  The patient's history has been reviewed, patient examined, no change in status, stable for surgery.  I have reviewed the patient's chart and labs.  Questions were answered to the patient's satisfaction.     Lesleigh Noe

## 2014-12-23 ENCOUNTER — Telehealth: Payer: Self-pay | Admitting: Cardiology

## 2014-12-23 ENCOUNTER — Ambulatory Visit (INDEPENDENT_AMBULATORY_CARE_PROVIDER_SITE_OTHER): Payer: BLUE CROSS/BLUE SHIELD | Admitting: Cardiology

## 2014-12-23 ENCOUNTER — Encounter: Payer: Self-pay | Admitting: Cardiology

## 2014-12-23 VITALS — BP 156/98 | HR 67 | Ht 69.0 in | Wt 386.0 lb

## 2014-12-23 DIAGNOSIS — I10 Essential (primary) hypertension: Secondary | ICD-10-CM

## 2014-12-23 DIAGNOSIS — I2510 Atherosclerotic heart disease of native coronary artery without angina pectoris: Secondary | ICD-10-CM

## 2014-12-23 DIAGNOSIS — E785 Hyperlipidemia, unspecified: Secondary | ICD-10-CM

## 2014-12-23 DIAGNOSIS — I5022 Chronic systolic (congestive) heart failure: Secondary | ICD-10-CM

## 2014-12-23 DIAGNOSIS — I429 Cardiomyopathy, unspecified: Secondary | ICD-10-CM | POA: Diagnosis not present

## 2014-12-23 DIAGNOSIS — G4733 Obstructive sleep apnea (adult) (pediatric): Secondary | ICD-10-CM

## 2014-12-23 MED ORDER — HYDRALAZINE HCL 25 MG PO TABS: 25.0000 mg | ORAL_TABLET | Freq: Three times a day (TID) | ORAL | Status: DC

## 2014-12-23 MED ORDER — SPIRONOLACTONE 25 MG PO TABS: 25.0000 mg | ORAL_TABLET | Freq: Every day | ORAL | Status: DC

## 2014-12-23 MED ORDER — ATORVASTATIN CALCIUM 80 MG PO TABS: 80.0000 mg | ORAL_TABLET | Freq: Every day | ORAL | Status: DC

## 2014-12-23 MED ORDER — ISOSORBIDE MONONITRATE ER 30 MG PO TB24
30.0000 mg | ORAL_TABLET | Freq: Every day | ORAL | Status: DC
Start: 2014-12-23 — End: 2015-03-11

## 2014-12-23 MED ORDER — FUROSEMIDE 20 MG PO TABS: 20.0000 mg | ORAL_TABLET | Freq: Every day | ORAL | Status: DC

## 2014-12-23 NOTE — Assessment & Plan Note (Signed)
euvolemic on examination. Decrease Lasix to 20 mg daily and add spironolactone 25 mg daily. Check potassium and renal function in 1 week.

## 2014-12-23 NOTE — Telephone Encounter (Signed)
No sooner appt available with dr Jens Som Will forward to dr Jens Som for any med changes prior to appt.

## 2014-12-23 NOTE — Assessment & Plan Note (Signed)
Blood pressure elevated. Add hydralazine 25 mg by mouth 3 times a day and Imdur 30 mg daily. Titrate medications to control pressure. I have asked him to purchase a blood pressure cuff and follow his blood pressure at home as well.

## 2014-12-23 NOTE — Patient Instructions (Signed)
Your physician recommends that you schedule a follow-up appointment in: AS SCHEDULED  INCREASE ATORVASTATIN TO 80 MG ONCE DAILY= 4 OF THE 20 MG TABLETS ONCE DAILY  Your physician recommends that you return for lab work in: 4 WEEKS = DO NOT EAT PRIOR TO LAB WORK  DECREASE FUROSEMIDE TO 20 MG ONCE DAILY= ONE TABLET ONCE DAILY  START SPIRONOLACTONE 25 MG ONCE DAILY  Your physician recommends that you return for lab work in: ONE WEEK  START HYDRALAZINE 25 MG ONE TABLET THREE TIMES DAILY  START ISOSORBIDE 30 MG ONCE DAILY

## 2014-12-23 NOTE — Telephone Encounter (Signed)
Left message for pt of dr Ludwig Clarks recommendations. Requested pt to call back if he was having problems or needed to talk to me.

## 2014-12-23 NOTE — Assessment & Plan Note (Signed)
Patient on CPAP

## 2014-12-23 NOTE — Assessment & Plan Note (Signed)
We discussed weight loss. 

## 2014-12-23 NOTE — Telephone Encounter (Signed)
Patient brought FMLA forms to office for Dr Jens Som to complete and sign.  Received FMLA paperwork and signed AUTH.  Patient to bring check or Money order back Wednesday a.m. For [employer] to process. lp

## 2014-12-23 NOTE — Assessment & Plan Note (Signed)
Possibly hypertensive mediated. Continue carvedilol and ARB. Add hydralazine/nitrates. Add spironolactone 25 mg daily. Titrate medications as tolerated by blood pressure. Plan repeat echocardiogram once blood pressure controlled. If ejection fraction less than 35% would need to consider ICD.

## 2014-12-23 NOTE — Assessment & Plan Note (Signed)
Continue aspirin and statin. 

## 2014-12-23 NOTE — Assessment & Plan Note (Signed)
Given documented coronary artery disease increase Lipitor to 80 mg daily and check lipids and liver in 4 weeks.

## 2014-12-23 NOTE — Telephone Encounter (Signed)
Pt is calling in to see if he can be seen sooner than 8/1 because when he had his stress test on 6/9, it stated that he had a weak heart muscle and he may need his medications adjusted. Please call  Thanks

## 2014-12-23 NOTE — Telephone Encounter (Signed)
Tyler [name] is returning your call [name] .Marland Kitchen

## 2014-12-23 NOTE — Telephone Encounter (Signed)
This encounter was created in error - please disregard.

## 2014-12-23 NOTE — Progress Notes (Signed)
HPI: FU hypertension and nonischemic cardiomyopathy. Patient had a previous nonischemic cardiomyopathy which improved on fu echos. However he was seen recently for chest pain. Nuclear study June 2016 showed an ejection fraction of 37%. There is an old inferior/inferior lateral infarct with peri-infarct ischemia. Cardiac catheterization July 2016 showed a 90% first posterior lateral branch, 90% distal LAD and 50% proximal RCA. Medical therapy recommended. Ejection fraction 25-35%. Since last seen he denies dyspnea on exertion, orthopnea, PND, pedal edema, chest pain or syncope. He was not taking all of his blood pressure medications prior to catheterization.  Current Outpatient Prescriptions  Medication Sig Dispense Refill  . albuterol (PROVENTIL HFA;VENTOLIN HFA) 108 (90 BASE) MCG/ACT inhaler Inhale 2 puffs into the lungs every 6 (six) hours as needed for wheezing. 1 Inhaler 11  . amLODipine (NORVASC) 5 MG tablet Take 1 tablet (5 mg total) by mouth daily. 90 tablet 1  . aspirin EC 81 MG tablet Take 81 mg by mouth daily.    Marland Kitchen atorvastatin (LIPITOR) 80 MG tablet Take 1 tablet (80 mg total) by mouth daily at 6 PM. 90 tablet 3  . carvedilol (COREG) 25 MG tablet TAKE TWO TABLETS BY MOUTH TWICE DAILY 360 tablet 1  . chlorpheniramine-HYDROcodone (TUSSIONEX) 10-8 MG/5ML LQCR Take 5 mLs by mouth every 12 (twelve) hours as needed for cough. 115 mL 0  . fexofenadine (ALLEGRA) 180 MG tablet Take 180 mg by mouth daily.    Marland Kitchen FISH OIL-CHOLECALCIFEROL PO Take 1 capsule by mouth daily.      . furosemide (LASIX) 20 MG tablet Take 1 tablet (20 mg total) by mouth daily. 180 tablet 0  . Multiple Vitamins-Minerals (MULTIVITAMIN WITH MINERALS) tablet Take 1 tablet by mouth daily.      . nitroGLYCERIN (NITROSTAT) 0.4 MG SL tablet Place 1 tablet (0.4 mg total) under the tongue every 5 (five) minutes as needed for chest pain. 25 tablet 3  . pantoprazole (PROTONIX) 40 MG tablet TAKE ONE TABLET BY MOUTH ONCE DAILY 90  tablet 1  . valsartan (DIOVAN) 160 MG tablet Take 1 tablet (160 mg total) by mouth 2 (two) times daily. 180 tablet 2  . hydrALAZINE (APRESOLINE) 25 MG tablet Take 1 tablet (25 mg total) by mouth 3 (three) times daily. 270 tablet 3  . isosorbide mononitrate (IMDUR) 30 MG 24 hr tablet Take 1 tablet (30 mg total) by mouth daily. 90 tablet 3  . spironolactone (ALDACTONE) 25 MG tablet Take 1 tablet (25 mg total) by mouth daily. 90 tablet 3   No current facility-administered medications for this visit.     Past Medical History  Diagnosis Date  . Hypertension   . CHF (congestive heart failure)   . ACHILLES TENDINITIS, BILATERAL 10/16/2008  . ALLERGIC RHINITIS 08/13/2009  . CARDIOMYOPATHY 04/21/2009  . CONGESTIVE HEART FAILURE 12/04/2007  . CORONARY ARTERY DISEASE 12/04/2007  . HYPERLIPIDEMIA 03/12/2007  . HYPERTENSION 03/12/2007  . OBESITY 04/21/2009  . SLEEP APNEA 03/12/2007  . Plantar fasciitis, bilateral 11/01/2011  . GERD (gastroesophageal reflux disease) 11/01/2011    Past Surgical History  Procedure Laterality Date  . No past surgeries    . Cardiac catheterization N/A 12/19/2014    Procedure: Left Heart Cath and Coronary Angiography;  Surgeon: Lyn Records, MD;  Location: [facility] INVASIVE CV LAB;  Service: Cardiovascular;  Laterality: N/A;    History   Social History  . Marital Status: Married    Spouse Name: N/A  . Number of Children: 2  . Years  of Education: N/A   Occupational History  . 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 Smoking status: Never Smoker   . Smokeless tobacco: Never Used  . Alcohol Use: No  . Drug Use: No  . Sexual Activity: Not on file   Other Topics Concern  . Not on file   Social History Narrative    ROS: no fevers or chills, productive cough, hemoptysis, dysphasia, odynophagia, melena, hematochezia, dysuria, hematuria, rash, seizure activity, orthopnea, PND, pedal edema, claudication. Remaining systems are negative.  Physical  Exam: Well-developed morbidly obese in no acute distress.  Skin is warm and dry.  HEENT is normal.  Neck is supple.  Chest is clear to auscultation with normal expansion.  Cardiovascular exam is regular rate and rhythm.  Abdominal exam nontender or distended. No masses palpated. Extremities show no edema. neuro grossly intact

## 2014-12-23 NOTE — Telephone Encounter (Signed)
Fu with me as scheduled or PAOV if needed sooner. Tyler [name]

## 2014-12-24 ENCOUNTER — Telehealth: Payer: Self-pay | Admitting: Cardiology

## 2014-12-24 NOTE — Progress Notes (Signed)
This encounter was created in error - please disregard.

## 2014-12-24 NOTE — Telephone Encounter (Signed)
7.6.16 Patient brought money order for [employer] to process his FMLA forms for Dr Jens Som to sign.  Signed Auth, FMLA form and money order sent to [employer] @ [facility] to process.  Sent via courier 12/24/14. lp

## 2015-01-01 ENCOUNTER — Telehealth: Payer: Self-pay | Admitting: Cardiology

## 2015-01-01 NOTE — Telephone Encounter (Signed)
Received FMLA forms back from [employer] for Dr Jens Som to review and sign.  Put in Dr Ludwig Clarks correspondence box for his review and sign. (01/05/15). lp

## 2015-01-02 LAB — BASIC METABOLIC PANEL WITH GFR
BUN: 16 mg/dL (ref 6–23)
CO2: 23 mEq/L (ref 19–32)
Calcium: 9.1 mg/dL (ref 8.4–10.5)
Chloride: 107 mEq/L (ref 96–112)
Creat: 1.02 mg/dL (ref 0.50–1.35)
GFR, Est African American: >89 mL/min
GFR, Est Non African American: >89 mL/min
Glucose, Bld: 84 mg/dL (ref 70–99)
Potassium: 4 mEq/L (ref 3.5–5.3)
Sodium: 141 mEq/L (ref 135–145)

## 2015-01-02 LAB — HEPATIC FUNCTION PANEL
ALT: 24 U/L (ref 0–53)
AST: 19 U/L (ref 0–37)
Albumin: 4.3 g/dL (ref 3.5–5.2)
Alkaline Phosphatase: 68 U/L (ref 39–117)
Bilirubin, Direct: 0.1 mg/dL (ref 0.0–0.3)
Indirect Bilirubin: 0.3 mg/dL (ref 0.2–1.2)
Total Bilirubin: 0.4 mg/dL (ref 0.2–1.2)
Total Protein: 7.4 g/dL (ref 6.0–8.3)

## 2015-01-02 LAB — LIPID PANEL
Cholesterol: 113 mg/dL (ref 0–200)
HDL: 32 mg/dL — ABNORMAL LOW (ref >=40)
LDL Cholesterol: 65 mg/dL (ref 0–99)
Total CHOL/HDL Ratio: 3.5 Ratio
Triglycerides: 79 mg/dL (ref <150)
VLDL: 16 mg/dL (ref 0–40)

## 2015-01-07 ENCOUNTER — Encounter: Payer: Self-pay | Admitting: Internal Medicine

## 2015-01-12 ENCOUNTER — Telehealth: Payer: Self-pay | Admitting: Cardiology

## 2015-01-12 ENCOUNTER — Encounter: Payer: Self-pay | Admitting: *Deleted

## 2015-01-12 NOTE — Telephone Encounter (Signed)
Pt is returning Debra's call in regards to some FMLA papers. He stated that he gets off at 3:30pm and could come to the office to pick up the papers if that is what the call was about.   Thanks

## 2015-01-12 NOTE — Telephone Encounter (Signed)
Received completed and signed FMLA [facility]) back from Dr Jens Som.  Patient picked up completed and signed forms. lp

## 2015-01-12 NOTE — Telephone Encounter (Signed)
Left message for pt to call.

## 2015-01-12 NOTE — Telephone Encounter (Signed)
Pt says he also need a note for work for 12-19-14. He would like to pick it up today please.

## 2015-01-12 NOTE — Telephone Encounter (Signed)
Patient here and letter given for work for 12-19-14. FMLA paperwork completed and given to the patient.

## 2015-01-15 NOTE — Progress Notes (Signed)
HPI: FU hypertension and nonischemic cardiomyopathy. Patient had a previous nonischemic cardiomyopathy which improved on fu echos. However he was seen recently for chest pain. Nuclear study June 2016 showed an ejection fraction of 37%. There is an old inferior/inferior lateral infarct with peri-infarct ischemia. Cardiac catheterization July 2016 showed a 90% first posterior lateral branch, 90% distal LAD and 50% proximal RCA. Medical therapy recommended. Ejection fraction 25-35%. Since last seen, the patient denies any dyspnea on exertion, orthopnea, PND, pedal edema, palpitations, syncope or chest pain.   Current Outpatient Prescriptions  Medication Sig Dispense Refill  . albuterol (PROVENTIL HFA;VENTOLIN HFA) 108 (90 BASE) MCG/ACT inhaler Inhale 2 puffs into the lungs every 6 (six) hours as needed for wheezing. 1 Inhaler 11  . amLODipine (NORVASC) 5 MG tablet Take 1 tablet (5 mg total) by mouth daily. 90 tablet 1  . aspirin EC 81 MG tablet Take 81 mg by mouth daily.    Marland Kitchen atorvastatin (LIPITOR) 80 MG tablet Take 1 tablet (80 mg total) by mouth daily at 6 PM. 90 tablet 3  . carvedilol (COREG) 25 MG tablet TAKE TWO TABLETS BY MOUTH TWICE DAILY 360 tablet 1  . chlorpheniramine-HYDROcodone (TUSSIONEX) 10-8 MG/5ML LQCR Take 5 mLs by mouth every 12 (twelve) hours as needed for cough. 115 mL 0  . fexofenadine (ALLEGRA) 180 MG tablet Take 180 mg by mouth daily.    Marland Kitchen FISH OIL-CHOLECALCIFEROL PO Take 1 capsule by mouth daily.      . furosemide (LASIX) 20 MG tablet Take 1 tablet (20 mg total) by mouth daily. 180 tablet 0  . hydrALAZINE (APRESOLINE) 50 MG tablet Take 1 tablet (50 mg total) by mouth 3 (three) times daily. 90 tablet 12  . isosorbide mononitrate (IMDUR) 30 MG 24 hr tablet Take 1 tablet (30 mg total) by mouth daily. 90 tablet 3  . Multiple Vitamins-Minerals (MULTIVITAMIN WITH MINERALS) tablet Take 1 tablet by mouth daily.      . nitroGLYCERIN (NITROSTAT) 0.4 MG SL tablet Place 1 tablet  (0.4 mg total) under the tongue every 5 (five) minutes as needed for chest pain. 25 tablet 3  . pantoprazole (PROTONIX) 40 MG tablet TAKE ONE TABLET BY MOUTH ONCE DAILY 90 tablet 1  . spironolactone (ALDACTONE) 25 MG tablet Take 1 tablet (25 mg total) by mouth daily. 90 tablet 3  . valsartan (DIOVAN) 160 MG tablet Take 1 tablet (160 mg total) by mouth 2 (two) times daily. 180 tablet 2   No current facility-administered medications for this visit.     Past Medical History  Diagnosis Date  . Hypertension   . CHF (congestive heart failure)   . ACHILLES TENDINITIS, BILATERAL 10/16/2008  . ALLERGIC RHINITIS 08/13/2009  . CARDIOMYOPATHY 04/21/2009  . CONGESTIVE HEART FAILURE 12/04/2007  . CORONARY ARTERY DISEASE 12/04/2007  . HYPERLIPIDEMIA 03/12/2007  . HYPERTENSION 03/12/2007  . OBESITY 04/21/2009  . SLEEP APNEA 03/12/2007  . Plantar fasciitis, bilateral 11/01/2011  . GERD (gastroesophageal reflux disease) 11/01/2011    Past Surgical History  Procedure Laterality Date  . No past surgeries    . Cardiac catheterization N/A 12/19/2014    Procedure: Left Heart Cath and Coronary Angiography;  Surgeon: Lyn Records, MD;  Location: [facility] INVASIVE CV LAB;  Service: Cardiovascular;  Laterality: N/A;    History   Social History  . Marital Status: Married    Spouse Name: N/A  . Number of Children: 2  . Years of Education: N/A   Occupational History  .  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 Smoking status: Never Smoker   . Smokeless tobacco: Never Used  . Alcohol Use: No  . Drug Use: No  . Sexual Activity: Not on file   Other Topics Concern  . Not on file   Social History Narrative    ROS: no fevers or chills, productive cough, hemoptysis, dysphasia, odynophagia, melena, hematochezia, dysuria, hematuria, rash, seizure activity, orthopnea, PND, pedal edema, claudication. Remaining systems are negative.  Physical Exam: Well-developed obese in no acute distress.    Skin is warm and dry.  HEENT is normal.  Neck is supple.  Chest is clear to auscultation with normal expansion.  Cardiovascular exam is regular rate and rhythm.  Abdominal exam nontender or distended. No masses palpated. Extremities show no edema. neuro grossly intact

## 2015-01-19 ENCOUNTER — Encounter: Payer: Self-pay | Admitting: Cardiology

## 2015-01-19 ENCOUNTER — Ambulatory Visit (INDEPENDENT_AMBULATORY_CARE_PROVIDER_SITE_OTHER): Payer: BLUE CROSS/BLUE SHIELD | Admitting: Cardiology

## 2015-01-19 VITALS — BP 140/92 | HR 58 | Ht 69.0 in | Wt 384.0 lb

## 2015-01-19 DIAGNOSIS — I5022 Chronic systolic (congestive) heart failure: Secondary | ICD-10-CM

## 2015-01-19 DIAGNOSIS — I429 Cardiomyopathy, unspecified: Secondary | ICD-10-CM

## 2015-01-19 DIAGNOSIS — E785 Hyperlipidemia, unspecified: Secondary | ICD-10-CM

## 2015-01-19 DIAGNOSIS — I10 Essential (primary) hypertension: Secondary | ICD-10-CM

## 2015-01-19 DIAGNOSIS — I2510 Atherosclerotic heart disease of native coronary artery without angina pectoris: Secondary | ICD-10-CM

## 2015-01-19 MED ORDER — HYDRALAZINE HCL 50 MG PO TABS: 50.0000 mg | ORAL_TABLET | Freq: Three times a day (TID) | ORAL | Status: DC

## 2015-01-19 NOTE — Patient Instructions (Signed)
Your physician recommends that you schedule a follow-up appointment in: 6 WEEKS WITH EXTENDER FOR MEDICATION TITRATION   Your physician recommends that you schedule a follow-up appointment in: 3 MONTHS WITH DR CRENSHAW  INCREASE HYDRALAZINE TO 50 MG THREE TIMES DAILY= 2 OF THE 25 MG TABLETS THREE TIMES DAILY

## 2015-01-19 NOTE — Assessment & Plan Note (Signed)
Blood pressure elevated. Increase hydralazine to 50 mg by mouth 3 times a day. 

## 2015-01-19 NOTE — Assessment & Plan Note (Signed)
Continue statin. 

## 2015-01-19 NOTE — Assessment & Plan Note (Signed)
Discussed weight loss. 

## 2015-01-19 NOTE — Assessment & Plan Note (Signed)
Possibly hypertensive mediated. Continue carvedilol and ARB. Increase hydralazine to 50 mg by mouth 3 times a day and continue nitrates. Titrate medications as tolerated by blood pressure. Plan repeat echocardiogram once blood pressure controlled. If ejection fraction less than 35% would need to consider ICD.

## 2015-01-19 NOTE — Assessment & Plan Note (Signed)
Continue aspirin and statin. 

## 2015-01-19 NOTE — Assessment & Plan Note (Signed)
Continue present dose of diabetics.euvolemic on examination.

## 2015-02-02 ENCOUNTER — Other Ambulatory Visit: Payer: Self-pay | Admitting: Cardiology

## 2015-02-18 ENCOUNTER — Ambulatory Visit (INDEPENDENT_AMBULATORY_CARE_PROVIDER_SITE_OTHER): Payer: BLUE CROSS/BLUE SHIELD | Admitting: Internal Medicine

## 2015-02-18 ENCOUNTER — Encounter: Payer: Self-pay | Admitting: Internal Medicine

## 2015-02-18 VITALS — BP 122/78 | HR 55 | Ht 69.0 in | Wt 381.2 lb

## 2015-02-18 DIAGNOSIS — I5022 Chronic systolic (congestive) heart failure: Secondary | ICD-10-CM

## 2015-02-18 DIAGNOSIS — J4521 Mild intermittent asthma with (acute) exacerbation: Secondary | ICD-10-CM | POA: Diagnosis not present

## 2015-02-18 DIAGNOSIS — G4733 Obstructive sleep apnea (adult) (pediatric): Secondary | ICD-10-CM | POA: Diagnosis not present

## 2015-02-18 NOTE — Assessment & Plan Note (Signed)
We discussed compliance goals. He needs to use it longer on more nights. Sometimes airflow is insufficient for comfort. Plan-DME/[facility] to change his auto Pap range to 8-20

## 2015-02-18 NOTE — Assessment & Plan Note (Signed)
Currently well controlled.  

## 2015-02-18 NOTE — Assessment & Plan Note (Signed)
Not in overt heart failure at this visit

## 2015-02-18 NOTE — Progress Notes (Signed)
11/13/14- 38 yoM never smoker seen to establish for pulmonary medicine care after bronchitis in Feb, 2016. SOB, prod. cough with green mucus. wants to know if he can get his CPAP machine had sleep study last year. Unattended home sleep study 08/26/13- AHI 17/ hr, weight 393 lbs CPAP was prescribed but he says he never went to pick it up.  Family is concerned about ongoing snoring and witnessed apneas. He is particularly concerned about recurrent bronchitis which he describes as a prolonged and "draining" experience once or twice a year, usually winter or early spring. Usually triggered by colds. History of congestive heart failure followed by cardiology. Pneumonia around a year or 2 ago. Seasonal allergic rhinitis with perennial nasal congestion and drainage. Aware of esophageal reflux. Works as a Midwife with little physical activity. Easily short of breath without change. He denies routine cough, phlegm, wheeze.  8/31/6-  39 yoM never smoker followed for bronchitis, OSA,, allergic rhinitis complicated by CHF, GERD, morbid obesity  FOLLOWS VUY:EBXIDHW states hes doing pretty good. using cpap every night, pt states feels like its not blowing enough air wakes up in the middle of the night. cpap set at auto. pt states has a little congestion. review pft with patient. Download-63% compliance, auto 5-20/[facility], AHI 1.4 No recent acute respiratory complaints.  ROS-see HPI   Negative unless "+" Constitutional:    weight loss, night sweats, fevers, chills,+fatigue, lassitude. HEENT:    headaches, difficulty swallowing, tooth/dental problems, sore throat,       sneezing, itching, ear ache, nasal congestion, post nasal drip, snoring CV:    chest pain, orthopnea, PND, swelling in lower extremities, anasarca,                                                      dizziness, palpitations Resp:   +shortness of breath with exertion or at rest.                productive cough,   non-productive cough, coughing  up of blood.              change in color of mucus.  wheezing.   Skin:    rash or lesions. GI:  No-   heartburn, indigestion, abdominal pain, nausea, vomiting,  GU:  MS:   joint pain, stiffness,  Neuro-     nothing unusual Psych:  change in mood or affect.  depression or anxiety.   memory loss.  OBJ- Physical Exam General- Alert, Oriented, Affect-appropriate, Distress- none acute, + morbid obesity Skin- rash-none, lesions- none, excoriation- none Lymphadenopathy- none Head- atraumatic            Eyes- Gross vision intact, PERRLA, conjunctivae and secretions clear            Ears- Hearing, canals-normal            Nose- Clear, no-Septal dev, mucus, polyps, erosion, perforation             Throat- Mallampati II , mucosa clear , drainage- none, tonsils- atrophic Neck- flexible , trachea midline, no stridor , thyroid nl, carotid no bruit Chest - symmetrical excursion , unlabored           Heart/CV- RRR , no murmur , no gallop  , no rub, nl s1 s2                           -  JVD- none , edema- none, stasis changes- none, varices- none           Lung- clear to P&A, wheeze- none, cough- none , dullness-none, rub- none           Chest wall-  Abd-  Br/ Gen/ Rectal- Not done, not indicated Extrem- cyanosis- none, clubbing, none, atrophy- none, strength- nl Neuro- grossly intact to observation    .

## 2015-02-18 NOTE — Assessment & Plan Note (Signed)
Offered phone number so he could call Bariatric program for evaluation

## 2015-02-18 NOTE — Patient Instructions (Signed)
Order- DME [facility]- change auto to 8-20  Suggest regular use of otc Flonase/ fluticasone nasal spray   1-2 puffs each nostril once daily at bedtime  Phone number for Bariatric program

## 2015-02-25 ENCOUNTER — Encounter: Payer: Self-pay | Admitting: Internal Medicine

## 2015-03-04 ENCOUNTER — Ambulatory Visit: Payer: BLUE CROSS/BLUE SHIELD | Admitting: Physician Assistant

## 2015-03-11 ENCOUNTER — Encounter: Payer: Self-pay | Admitting: Cardiology

## 2015-03-11 ENCOUNTER — Ambulatory Visit (INDEPENDENT_AMBULATORY_CARE_PROVIDER_SITE_OTHER): Payer: BLUE CROSS/BLUE SHIELD | Admitting: Cardiology

## 2015-03-11 VITALS — BP 136/84 | HR 56 | Ht 69.0 in | Wt 382.6 lb

## 2015-03-11 DIAGNOSIS — E785 Hyperlipidemia, unspecified: Secondary | ICD-10-CM | POA: Diagnosis not present

## 2015-03-11 DIAGNOSIS — I10 Essential (primary) hypertension: Secondary | ICD-10-CM

## 2015-03-11 DIAGNOSIS — I429 Cardiomyopathy, unspecified: Secondary | ICD-10-CM | POA: Diagnosis not present

## 2015-03-11 DIAGNOSIS — G4733 Obstructive sleep apnea (adult) (pediatric): Secondary | ICD-10-CM

## 2015-03-11 DIAGNOSIS — I5022 Chronic systolic (congestive) heart failure: Secondary | ICD-10-CM | POA: Diagnosis not present

## 2015-03-11 DIAGNOSIS — I428 Other cardiomyopathies: Secondary | ICD-10-CM

## 2015-03-11 MED ORDER — ISOSORBIDE MONONITRATE ER 60 MG PO TB24: 60.0000 mg | ORAL_TABLET | Freq: Every day | ORAL | Status: DC

## 2015-03-11 NOTE — Progress Notes (Signed)
Cardiology Office Note   Date:  03/11/2015   ID:  Tyler, [name] 05-21-1976, MRN [id number]  PCP:  Oliver Barre, MD  Cardiologist:  Dr. Jens Som    Chief Complaint  Patient presents with  . med titration    6 week visit per Dr. Jens Som; complains of indigestion in center of chest at night; complains of occasional fluttering      History of Present Illness: Tyler [name] is a 39 y.o. male who presents for hypertension and nonischemic cardiomyopathy. Patient had a previous nonischemic cardiomyopathy which improved on fu echos. However he was seen recently for chest pain. Nuclear study June 2016 showed an ejection fraction of 37%. There is an old inferior/inferior lateral infarct with peri-infarct ischemia. Cardiac catheterization July 2016 showed a 90% first posterior lateral branch, 90% distal LAD and 50% proximal RCA. Medical therapy recommended. Ejection fraction 25-35%. Since last seen, the patient denies any dyspnea on exertion, orthopnea, PND, pedal edema, palpitations, syncope.  He does complain of chest pain at times, not with exertion, no associates symptoms of nausea, diaphoresis or SOB.    BP here has improved control though he states at home is still around 150 systolic.  occ palpitation.   He is walking for exercise and no chest pain then.  He is watching his Na and has decreased.  He has been eating more salads than he was.  He wears his CPAP mask at night.   Last lipids with LDL of 65.  He is asking about gastric bypass.    Past Medical History  Diagnosis Date  . Hypertension   . CHF (congestive heart failure)   . ACHILLES TENDINITIS, BILATERAL 10/16/2008  . ALLERGIC RHINITIS 08/13/2009  . CARDIOMYOPATHY 04/21/2009  . CONGESTIVE HEART FAILURE 12/04/2007  . CORONARY ARTERY DISEASE 12/04/2007  . HYPERLIPIDEMIA 03/12/2007  . HYPERTENSION 03/12/2007  . OBESITY 04/21/2009  . SLEEP APNEA 03/12/2007  . Plantar fasciitis, bilateral 11/01/2011  . GERD (gastroesophageal reflux  disease) 11/01/2011    Past Surgical History  Procedure Laterality Date  . No past surgeries    . Cardiac catheterization N/A 12/19/2014    Procedure: Left Heart Cath and Coronary Angiography;  Surgeon: Lyn Records, MD;  Location: [facility] INVASIVE CV LAB;  Service: Cardiovascular;  Laterality: N/A;     Current Outpatient Prescriptions  Medication Sig Dispense Refill  . albuterol (PROVENTIL HFA;VENTOLIN HFA) 108 (90 BASE) MCG/ACT inhaler Inhale 2 puffs into the lungs every 6 (six) hours as needed for wheezing. 1 Inhaler 11  . amLODipine (NORVASC) 5 MG tablet TAKE ONE TABLET BY MOUTH ONCE DAILY 90 tablet 3  . aspirin EC 81 MG tablet Take 81 mg by mouth daily.    Marland Kitchen atorvastatin (LIPITOR) 80 MG tablet Take 1 tablet (80 mg total) by mouth daily at 6 PM. 90 tablet 3  . carvedilol (COREG) 25 MG tablet TAKE TWO TABLETS BY MOUTH TWICE DAILY 360 tablet 1  . chlorpheniramine-HYDROcodone (TUSSIONEX) 10-8 MG/5ML LQCR Take 5 mLs by mouth every 12 (twelve) hours as needed for cough. 115 mL 0  . fexofenadine (ALLEGRA) 180 MG tablet Take 180 mg by mouth daily.    Marland Kitchen FISH OIL-CHOLECALCIFEROL PO Take 1 capsule by mouth daily.      . furosemide (LASIX) 20 MG tablet Take 1 tablet (20 mg total) by mouth daily. (Patient taking differently: Take 10 mg by mouth daily. ) 180 tablet 0  . hydrALAZINE (APRESOLINE) 50 MG tablet Take 1 tablet (50 mg total) by  mouth 3 (three) times daily. 90 tablet 12  . isosorbide mononitrate (IMDUR) 30 MG 24 hr tablet Take 1 tablet (30 mg total) by mouth daily. 90 tablet 3  . Multiple Vitamins-Minerals (MULTIVITAMIN WITH MINERALS) tablet Take 1 tablet by mouth daily.      . nitroGLYCERIN (NITROSTAT) 0.4 MG SL tablet Place 1 tablet (0.4 mg total) under the tongue every 5 (five) minutes as needed for chest pain. 25 tablet 3  . pantoprazole (PROTONIX) 40 MG tablet TAKE ONE TABLET BY MOUTH ONCE DAILY 90 tablet 1  . spironolactone (ALDACTONE) 25 MG tablet Take 1 tablet (25 mg total) by mouth  daily. 90 tablet 3  . valsartan (DIOVAN) 160 MG tablet Take 1 tablet (160 mg total) by mouth 2 (two) times daily. 180 tablet 2   No current facility-administered medications for this visit.    Allergies:   Review of patient's allergies indicates no known allergies.    Social History:  The patient  reports that he has never smoked. He has never used smokeless tobacco. He reports that he does not drink alcohol or use illicit drugs.   Family History:  The patient's family history includes Asthma in his brother; Heart attack in his father and mother; Heart disease in his father and mother; Hypertension in his other.    ROS:  General:no colds or fevers, no weight changes- stable Skin:no rashes or ulcers HEENT:no blurred vision, no congestion CV:see HPI PUL:see HPI- sleeps on his usual 2-3 pillows.  GI:no diarrhea constipation or melena, no indigestion GU:no hematuria, no dysuria MS:no joint pain, no claudication Neuro:no syncope, no lightheadedness Endo:no diabetes, no thyroid disease  Wt Readings from Last 3 Encounters:  03/11/15 382 lb 9.6 oz (173.546 kg)  02/18/15 381 lb 3.2 oz (172.911 kg)  01/19/15 384 lb (174.181 kg)     PHYSICAL EXAM: VS:  BP 136/84 mmHg  Pulse 56  Ht  (1.753 m)  Wt 382 lb 9.6 oz (173.546 kg)  BMI 56.47 kg/m2 , BMI Body mass index is 56.47 kg/(m^2). General:Pleasant affect, NAD Skin:Warm and dry, brisk capillary refill HEENT:normocephalic, sclera clear, mucus membranes moist Neck:supple, no JVD, no bruits  Heart:S1S2 RRR without murmur, gallup, rub or click Lungs:clear without rales, rhonchi, or wheezes WUJ:WJXBJ, soft, non tender, + BS, do not palpate liver spleen or masses Ext:1+ lower ext edema, 2+ pedal pulses, 2+ radial pulses Neuro:alert and oriented X 3, MAE, follows commands, + facial symmetry    EKG:  EKG is NOT ordered today.    Recent Labs: 12/15/2014: Hemoglobin 13.6; Platelets 224.0 01/01/2015: ALT 24; BUN 16; Creat 1.02;  Potassium 4.0; Sodium 141    Lipid Panel    Component Value Date/Time   CHOL 113 01/01/2015 1546   TRIG 79 01/01/2015 1546   HDL 32* 01/01/2015 1546   CHOLHDL 3.5 01/01/2015 1546   VLDL 16 01/01/2015 1546   LDLCALC 65 01/01/2015 1546   LDLDIRECT 183.6 08/02/2011 1642       Other studies Reviewed: Additional studies/ records that were reviewed today include:  Cardiac cath, nuc study. Previous notes.  ECHO:  07/2013  Study Conclusions  - Left ventricle: The cavity size was normal. There was moderate focal basal and mild concentric hypertrophy. Systolic function was normal. The estimated ejection fraction was in the range of 50% to 55%. Wall motion was normal; there were no regional wall motion abnormalities. There was an increased relative contribution of atrial contraction to ventricular filling. Doppler parameters are consistent with abnormal left  ventricular relaxation (grade 1 diastolic dysfunction). - Aortic valve: Mild regurgitation. - Atrial septum: A patent foramen ovale cannot be excluded. Recommendations: Consider study if clinically indicated with agitated saline contrast administration.  ASSESSMENT AND PLAN:  1.  NICM EF at cath 25-35% on nuc 37%- BP is with improved control -he will order an echo in 4 weeks prior to his visit with Dr. Velta Addison   2. With chest pain and distal CAD will increase Imdur to 60 mg daily.   3. HTN increasing imdur will help with BP.   4. Hyperlipidemia stable lipids.  5. Obesity is cutting back on fatty foods and eating salads    Current medicines are reviewed with the patient today.  The patient Has no concerns regarding medicines.  The following changes have been made:  See above Labs/ tests ordered today include:see above  Disposition:   FU:  see above  Nyoka Lint, NP  03/11/2015 4:39 PM    [facility] [address] [place], [place], [place]  [address]/ [address] [employer] [address] [place], [place] Phone: [phone]; Fax: [phone]  [phone]

## 2015-03-11 NOTE — Patient Instructions (Addendum)
Your physician has recommended you make the following change in your medication: INCREASE imdur to 60mg  daily >> you can take 2 of you 30mg  tablets >> a new prescription was sent to your pharmacy   Your physician has requested that you have an echocardiogram @ [address] prior to appointment with Dr. Jens Som. Echocardiography is a painless test that uses sound waves to create images of your heart. It provides your doctor with information about the size and shape of your heart and how well your heart's chambers and valves are working. This procedure takes approximately one hour. There are no restrictions for this procedure.

## 2015-03-24 ENCOUNTER — Other Ambulatory Visit (HOSPITAL_COMMUNITY): Payer: BLUE CROSS/BLUE SHIELD

## 2015-03-28 ENCOUNTER — Other Ambulatory Visit: Payer: Self-pay | Admitting: Cardiology

## 2015-03-30 NOTE — Telephone Encounter (Signed)
REFILL 

## 2015-04-14 ENCOUNTER — Other Ambulatory Visit: Payer: Self-pay

## 2015-04-14 ENCOUNTER — Ambulatory Visit (HOSPITAL_COMMUNITY): Payer: BLUE CROSS/BLUE SHIELD | Attending: Internal Medicine

## 2015-04-14 DIAGNOSIS — I071 Rheumatic tricuspid insufficiency: Secondary | ICD-10-CM | POA: Diagnosis not present

## 2015-04-14 DIAGNOSIS — I517 Cardiomegaly: Secondary | ICD-10-CM | POA: Diagnosis not present

## 2015-04-14 DIAGNOSIS — I10 Essential (primary) hypertension: Secondary | ICD-10-CM | POA: Insufficient documentation

## 2015-04-14 DIAGNOSIS — Z6841 Body Mass Index (BMI) 40.0 and over, adult: Secondary | ICD-10-CM | POA: Diagnosis not present

## 2015-04-14 DIAGNOSIS — G4733 Obstructive sleep apnea (adult) (pediatric): Secondary | ICD-10-CM | POA: Insufficient documentation

## 2015-04-14 DIAGNOSIS — E785 Hyperlipidemia, unspecified: Secondary | ICD-10-CM | POA: Insufficient documentation

## 2015-04-14 DIAGNOSIS — I429 Cardiomyopathy, unspecified: Secondary | ICD-10-CM

## 2015-04-14 DIAGNOSIS — K219 Gastro-esophageal reflux disease without esophagitis: Secondary | ICD-10-CM | POA: Diagnosis not present

## 2015-04-14 DIAGNOSIS — E119 Type 2 diabetes mellitus without complications: Secondary | ICD-10-CM | POA: Diagnosis not present

## 2015-04-14 DIAGNOSIS — I428 Other cardiomyopathies: Secondary | ICD-10-CM

## 2015-04-19 NOTE — Progress Notes (Signed)
HPI: FU hypertension and nonischemic cardiomyopathy. Patient had a previous nonischemic cardiomyopathy which improved on fu echos. However he was seen recently for chest pain. Nuclear study June 2016 showed an ejection fraction of 37%. There is an old inferior/inferior lateral infarct with peri-infarct ischemia. Cardiac catheterization July 2016 showed a 90% first posterior lateral branch, 90% distal LAD and 50% proximal RCA. Medical therapy recommended. Ejection fraction 25-35%. Echo repeated 10/15 on therapy and EF 40-45, grade 1 diastolic dysfunction, moderate RAE, mild TR. Since last seen, the patient has dyspnea with more extreme activities but not with routine activities. It is relieved with rest. It is not associated with chest pain. There is no orthopnea, PND or pedal edema. There is no syncope or palpitations. There is no exertional chest pain.   Current Outpatient Prescriptions  Medication Sig Dispense Refill  . albuterol (PROVENTIL HFA;VENTOLIN HFA) 108 (90 BASE) MCG/ACT inhaler Inhale 2 puffs into the lungs every 6 (six) hours as needed for wheezing. 1 Inhaler 11  . amLODipine (NORVASC) 5 MG tablet TAKE ONE TABLET BY MOUTH ONCE DAILY 90 tablet 3  . aspirin EC 81 MG tablet Take 81 mg by mouth daily.    Marland Kitchen atorvastatin (LIPITOR) 80 MG tablet Take 1 tablet (80 mg total) by mouth daily at 6 PM. 90 tablet 3  . carvedilol (COREG) 25 MG tablet TAKE TWO TABLETS BY MOUTH TWICE DAILY 360 tablet 0  . chlorpheniramine-HYDROcodone (TUSSIONEX) 10-8 MG/5ML LQCR Take 5 mLs by mouth every 12 (twelve) hours as needed for cough. 115 mL 0  . fexofenadine (ALLEGRA) 180 MG tablet Take 180 mg by mouth daily.    Marland Kitchen FISH OIL-CHOLECALCIFEROL PO Take 1 capsule by mouth daily.      . furosemide (LASIX) 20 MG tablet Take 1 tablet (20 mg total) by mouth daily. (Patient taking differently: Take 10 mg by mouth daily. ) 180 tablet 0  . hydrALAZINE (APRESOLINE) 50 MG tablet Take 1 tablet (50 mg total) by mouth 3  (three) times daily. 90 tablet 12  . isosorbide mononitrate (IMDUR) 30 MG 24 hr tablet Take 1 tablet by mouth daily.  3  . Multiple Vitamins-Minerals (MULTIVITAMIN WITH MINERALS) tablet Take 1 tablet by mouth daily.      . nitroGLYCERIN (NITROSTAT) 0.4 MG SL tablet Place 1 tablet (0.4 mg total) under the tongue every 5 (five) minutes as needed for chest pain. 25 tablet 3  . pantoprazole (PROTONIX) 40 MG tablet TAKE ONE TABLET BY MOUTH ONCE DAILY 90 tablet 1  . spironolactone (ALDACTONE) 25 MG tablet Take 1 tablet (25 mg total) by mouth daily. 90 tablet 3  . valsartan (DIOVAN) 160 MG tablet Take 1 tablet (160 mg total) by mouth 2 (two) times daily. 180 tablet 2   No current facility-administered medications for this visit.     Past Medical History  Diagnosis Date  . Hypertension   . CHF (congestive heart failure) (HCC)   . ACHILLES TENDINITIS, BILATERAL 10/16/2008  . ALLERGIC RHINITIS 08/13/2009  . CARDIOMYOPATHY 04/21/2009  . CONGESTIVE HEART FAILURE 12/04/2007  . CORONARY ARTERY DISEASE 12/04/2007  . HYPERLIPIDEMIA 03/12/2007  . HYPERTENSION 03/12/2007  . OBESITY 04/21/2009  . SLEEP APNEA 03/12/2007  . Plantar fasciitis, bilateral 11/01/2011  . GERD (gastroesophageal reflux disease) 11/01/2011    Past Surgical History  Procedure Laterality Date  . No past surgeries    . Cardiac catheterization N/A 12/19/2014    Procedure: Left Heart Cath and Coronary Angiography;  Surgeon: Lyn Records,  MD;  Location: [facility] INVASIVE CV LAB;  Service: Cardiovascular;  Laterality: N/A;    Social History   Social History  . Marital Status: Married    Spouse Name: N/A  . Number of Children: 2  . Years of Education: N/A   Occupational History  . 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 Smoking status: Never Smoker   . Smokeless tobacco: Never Used  . Alcohol Use: No  . Drug Use: No  . Sexual Activity: Not on file   Other Topics Concern  . Not on file   Social History  Narrative    ROS: no fevers or chills, productive cough, hemoptysis, dysphasia, odynophagia, melena, hematochezia, dysuria, hematuria, rash, seizure activity, orthopnea, PND, pedal edema, claudication. Remaining systems are negative.  Physical Exam: Well-developed obese in no acute distress.  Skin is warm and dry.  HEENT is normal.  Neck is supple.  Chest is clear to auscultation with normal expansion.  Cardiovascular exam is regular rate and rhythm.  Abdominal exam nontender or distended. No masses palpated. Extremities show no edema. neuro grossly intact

## 2015-04-21 ENCOUNTER — Encounter: Payer: Self-pay | Admitting: Cardiology

## 2015-04-21 ENCOUNTER — Ambulatory Visit: Payer: BLUE CROSS/BLUE SHIELD | Admitting: Cardiology

## 2015-04-21 ENCOUNTER — Ambulatory Visit (INDEPENDENT_AMBULATORY_CARE_PROVIDER_SITE_OTHER): Payer: BLUE CROSS/BLUE SHIELD | Admitting: Cardiology

## 2015-04-21 VITALS — BP 140/78 | HR 60 | Ht 69.0 in | Wt 385.2 lb

## 2015-04-21 DIAGNOSIS — I5040 Unspecified combined systolic (congestive) and diastolic (congestive) heart failure: Secondary | ICD-10-CM

## 2015-04-21 DIAGNOSIS — I10 Essential (primary) hypertension: Secondary | ICD-10-CM | POA: Diagnosis not present

## 2015-04-21 DIAGNOSIS — I429 Cardiomyopathy, unspecified: Secondary | ICD-10-CM | POA: Diagnosis not present

## 2015-04-21 DIAGNOSIS — I2510 Atherosclerotic heart disease of native coronary artery without angina pectoris: Secondary | ICD-10-CM

## 2015-04-21 DIAGNOSIS — I428 Other cardiomyopathies: Secondary | ICD-10-CM

## 2015-04-21 MED ORDER — AMLODIPINE BESYLATE 10 MG PO TABS: 10.0000 mg | ORAL_TABLET | Freq: Every day | ORAL | Status: DC

## 2015-04-21 NOTE — Assessment & Plan Note (Signed)
Continue ARB and beta blocker. Cardiomyopathy felt likely hypertensive mediated. LV function improving on most recent echo.

## 2015-04-21 NOTE — Patient Instructions (Signed)
Medication Instructions:   INCREASE AMLODIPINE TO 10 MG ONCE DAILY= 2 OF THE 5 MG TABLETS ONCE DAILY  Labwork:  Your physician recommends that you HAVE LAB WORK TODAY  Follow-Up:  Your physician wants you to follow-up in: 6 MONTHS WITH DR Jens Som You will receive a reminder letter in the mail two months in advance. If you don't receive a letter, please call our office to schedule the follow-up appointment.   If you need a refill on your cardiac medications before your next appointment, please call your pharmacy.

## 2015-04-21 NOTE — Assessment & Plan Note (Signed)
Continue present dose of diuretics. Check potassium and renal function. 

## 2015-04-21 NOTE — Assessment & Plan Note (Signed)
Continue aspirin and statin. 

## 2015-04-21 NOTE — Assessment & Plan Note (Signed)
Blood pressure is borderline.I will increase amlodipine to 10 mg daily. Continue remaining medications.

## 2015-04-21 NOTE — Assessment & Plan Note (Signed)
Pt counseled on weight loss; he is considering gastric bypass.

## 2015-04-21 NOTE — Assessment & Plan Note (Signed)
Continue statin. 

## 2015-06-01 ENCOUNTER — Other Ambulatory Visit: Payer: Self-pay | Admitting: Internal Medicine

## 2015-06-09 ENCOUNTER — Ambulatory Visit (INDEPENDENT_AMBULATORY_CARE_PROVIDER_SITE_OTHER): Payer: BLUE CROSS/BLUE SHIELD

## 2015-06-09 DIAGNOSIS — Z23 Encounter for immunization: Secondary | ICD-10-CM | POA: Diagnosis not present

## 2015-06-30 ENCOUNTER — Ambulatory Visit: Payer: BLUE CROSS/BLUE SHIELD | Admitting: Internal Medicine

## 2015-07-10 ENCOUNTER — Other Ambulatory Visit: Payer: Self-pay | Admitting: Cardiology

## 2015-07-10 NOTE — Telephone Encounter (Signed)
Rx(s) sent to pharmacy electronically.  

## 2015-07-31 ENCOUNTER — Other Ambulatory Visit: Payer: Self-pay | Admitting: Cardiology

## 2015-08-21 ENCOUNTER — Encounter: Payer: Self-pay | Admitting: Family Medicine

## 2015-08-21 ENCOUNTER — Ambulatory Visit (INDEPENDENT_AMBULATORY_CARE_PROVIDER_SITE_OTHER): Payer: BLUE CROSS/BLUE SHIELD | Admitting: Family Medicine

## 2015-08-21 VITALS — BP 110/80 | HR 86 | Temp 99.4°F | Ht 69.0 in | Wt 398.0 lb

## 2015-08-21 DIAGNOSIS — J209 Acute bronchitis, unspecified: Secondary | ICD-10-CM

## 2015-08-21 MED ORDER — HYDROCOD POLST-CPM POLST ER 10-8 MG/5ML PO SUER: 5.0000 mL | Freq: Two times a day (BID) | ORAL | Status: DC | PRN

## 2015-08-21 NOTE — Progress Notes (Signed)
   Subjective:    Patient ID: Tyler [name], male    DOB: 1976/01/15, 40 y.o.   MRN: [id number]  HPI  Patient seen for acute visit as a work in. He has history of obesity, CAD, congestive heart failure, obstructive sleep apnea, GERD.  Seen today with approximately one-week history of cough. Occasionally productive. Mild body aches. He's had occasional chills but no documented fever. He had some leftover Levaquin 500 mg which he started 2 days ago. This was from prescription last spring which he never started on.  Cough is severe at night. He had one dose of Tussionex which he states is "the only thing that helps his severe cough at night ".  Past Medical History  Diagnosis Date  . Hypertension   . CHF (congestive heart failure) (HCC)   . ACHILLES TENDINITIS, BILATERAL 10/16/2008  . ALLERGIC RHINITIS 08/13/2009  . CARDIOMYOPATHY 04/21/2009  . CONGESTIVE HEART FAILURE 12/04/2007  . CORONARY ARTERY DISEASE 12/04/2007  . HYPERLIPIDEMIA 03/12/2007  . HYPERTENSION 03/12/2007  . OBESITY 04/21/2009  . SLEEP APNEA 03/12/2007  . Plantar fasciitis, bilateral 11/01/2011  . GERD (gastroesophageal reflux disease) 11/01/2011   Past Surgical History  Procedure Laterality Date  . No past surgeries    . Cardiac catheterization N/A 12/19/2014    Procedure: Left Heart Cath and Coronary Angiography;  Surgeon: Lyn Records, MD;  Location: [facility] INVASIVE CV LAB;  Service: Cardiovascular;  Laterality: N/A;    reports that he has never smoked. He has never used smokeless tobacco. He reports that he does not drink alcohol or use illicit drugs. family history includes Asthma in his brother; Heart attack in his father and mother; Heart disease in his father and mother; Hypertension in his other. No Known Allergies    Review of Systems  Constitutional: Positive for fatigue. Negative for fever and chills.  HENT: Positive for congestion and sinus pressure.   Respiratory: Positive for cough. Negative for shortness of  breath and wheezing.   Cardiovascular: Negative for chest pain.       Objective:   Physical Exam  Constitutional: He appears well-developed and well-nourished.  HENT:  Right Ear: External ear normal.  Left Ear: External ear normal.  Mouth/Throat: Oropharynx is clear and moist.  Neck: Neck supple.  Cardiovascular: Normal rate and regular rhythm.   Pulmonary/Chest: Effort normal and breath sounds normal. No respiratory distress. He has no wheezes. He has no rales.  Musculoskeletal: He exhibits no edema.          Assessment & Plan:   Cough. Suspect acute bronchitis. He is encouraged to finish out Levaquin since he had already started course of this - though may be all viral. We refilled Tussionex 120 mL 1 teaspoon daily at bedtime for severe cough. Cautioned about sedation.

## 2015-08-21 NOTE — Progress Notes (Signed)
Pre visit review using our clinic review tool, if applicable. No additional management support is needed unless otherwise documented below in the visit note. 

## 2015-08-21 NOTE — Patient Instructions (Signed)

## 2015-11-19 DIAGNOSIS — L918 Other hypertrophic disorders of the skin: Secondary | ICD-10-CM | POA: Diagnosis not present

## 2015-11-19 DIAGNOSIS — L219 Seborrheic dermatitis, unspecified: Secondary | ICD-10-CM | POA: Diagnosis not present

## 2015-11-20 ENCOUNTER — Other Ambulatory Visit: Payer: Self-pay

## 2015-11-20 DIAGNOSIS — I429 Cardiomyopathy, unspecified: Secondary | ICD-10-CM

## 2015-11-20 MED ORDER — FUROSEMIDE 20 MG PO TABS: 20.0000 mg | ORAL_TABLET | Freq: Every day | ORAL | Status: DC

## 2015-12-14 ENCOUNTER — Other Ambulatory Visit: Payer: Self-pay | Admitting: *Deleted

## 2015-12-14 DIAGNOSIS — I429 Cardiomyopathy, unspecified: Secondary | ICD-10-CM

## 2015-12-14 MED ORDER — FUROSEMIDE 20 MG PO TABS: 20.0000 mg | ORAL_TABLET | Freq: Every day | ORAL | Status: DC

## 2015-12-14 MED ORDER — ISOSORBIDE MONONITRATE ER 30 MG PO TB24: 30.0000 mg | ORAL_TABLET | Freq: Every day | ORAL | Status: DC

## 2015-12-16 ENCOUNTER — Other Ambulatory Visit: Payer: Self-pay

## 2015-12-16 MED ORDER — ISOSORBIDE MONONITRATE ER 30 MG PO TB24
30.0000 mg | ORAL_TABLET | Freq: Every day | ORAL | Status: DC
Start: 2015-12-16 — End: 2016-11-19

## 2015-12-28 ENCOUNTER — Telehealth: Payer: Self-pay | Admitting: Cardiology

## 2015-12-28 NOTE — Telephone Encounter (Signed)
Patient brought FMLA forms for Dr Jens Som to review, complete and sign.  Received signed AUTH/FMLA forms and Money Order.  Sent to [employer] on 12/28/15 (next [employer] pick up 12/29/15. lp

## 2016-01-01 ENCOUNTER — Telehealth: Payer: Self-pay | Admitting: Cardiology

## 2016-01-01 NOTE — Telephone Encounter (Signed)
Received FMLA forms back from [facility] for Dr Jens Som to review, complete and sign.  Forms given to Dr Jens Som for completion and sign. lp

## 2016-01-11 ENCOUNTER — Telehealth: Payer: Self-pay | Admitting: Cardiology

## 2016-01-11 NOTE — Telephone Encounter (Signed)
Received signed FMLA forms back from Dr Jens Som.  Patient notified and wants to pick up forms.  Prepared forms for pick up at front desk. lp

## 2016-01-14 ENCOUNTER — Ambulatory Visit (INDEPENDENT_AMBULATORY_CARE_PROVIDER_SITE_OTHER): Payer: BLUE CROSS/BLUE SHIELD | Admitting: Family Medicine

## 2016-01-14 ENCOUNTER — Ambulatory Visit: Payer: BLUE CROSS/BLUE SHIELD | Admitting: Family Medicine

## 2016-01-14 ENCOUNTER — Encounter: Payer: Self-pay | Admitting: Family Medicine

## 2016-01-14 VITALS — BP 114/68 | HR 84 | Temp 97.7°F | Ht 69.0 in | Wt >= 6400 oz

## 2016-01-14 DIAGNOSIS — J069 Acute upper respiratory infection, unspecified: Secondary | ICD-10-CM

## 2016-01-14 DIAGNOSIS — J309 Allergic rhinitis, unspecified: Secondary | ICD-10-CM

## 2016-01-14 NOTE — Progress Notes (Signed)
Pre visit review using our clinic review tool, if applicable. No additional management support is needed unless otherwise documented below in the visit note. 

## 2016-01-14 NOTE — Progress Notes (Signed)
Subjective:    Patient ID: Tyler [name], male    DOB: [birth year], 40 y.o.   MRN: [id number]  HPI  Mr. Juanetta Gosling is a 40 year old male who present today with nasal congestion/obstruction for 2 days.  Associated symptom of sneezing and rhinitis, itchy ears, productive cough with clear sputum.  Denies fever, chills, sweats, N/V/D, recent antibiotic use. 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  He reports a history of seasonal allergies and use of Allegra. He denies SOB today. No history of asthma and he reports that he has not used his inhaler in the past 6 months and states that he usually only needs this during the winter if he develops "cold symptoms".  He has a history of obesity, CAD, CHF, OSA, and GERD.  Review of Systems  Constitutional: Negative for chills, fatigue and fever.  HENT: Positive for congestion, rhinorrhea and sneezing. Negative for sinus pressure and sore throat.        Itchy ears  Eyes: Negative for itching.  Respiratory: Positive for cough. Negative for shortness of breath and wheezing.   Cardiovascular: Negative for chest pain and palpitations.  Gastrointestinal: Negative for abdominal pain, constipation, diarrhea, nausea and vomiting.  Musculoskeletal: Negative for myalgias.  Allergic/Immunologic: Positive for environmental allergies.  Neurological: Negative for dizziness, light-headedness, numbness and headaches.   Past Medical History:  Diagnosis Date  . ACHILLES TENDINITIS, BILATERAL 10/16/2008  . ALLERGIC RHINITIS 08/13/2009  . CARDIOMYOPATHY 04/21/2009  . CHF (congestive heart failure) (HCC)   . CONGESTIVE HEART FAILURE 12/04/2007  . CORONARY ARTERY DISEASE 12/04/2007  . GERD (gastroesophageal reflux disease) 11/01/2011  . HYPERLIPIDEMIA 03/12/2007  . Hypertension   . HYPERTENSION 03/12/2007  . OBESITY 04/21/2009  . Plantar fasciitis, bilateral 11/01/2011  . SLEEP APNEA 03/12/2007     Social History   Social History  . Marital status: Married   Spouse name: N/A  . Number of children: 2  . Years of education: N/A   Occupational History  . 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 Smoking status: Never Smoker  . Smokeless tobacco: Never Used  . Alcohol use No  . Drug use: No  . Sexual activity: Not on file   Other Topics Concern  . Not on file   Social History Narrative  . No narrative on file    Past Surgical History:  Procedure Laterality Date  . CARDIAC CATHETERIZATION N/A 12/19/2014   Procedure: Left Heart Cath and Coronary Angiography;  Surgeon: Lyn Records, MD;  Location: [facility] INVASIVE CV LAB;  Service: Cardiovascular;  Laterality: N/A;  . NO PAST SURGERIES      Family History  Problem Relation Age of Onset  . Asthma Brother   . Hypertension Other   . Heart attack Mother   . Heart disease Mother   . Heart attack Father   . Heart disease Father     No Known Allergies  Current Outpatient Prescriptions on File Prior to Visit  Medication Sig Dispense Refill  . albuterol (PROVENTIL HFA;VENTOLIN HFA) 108 (90 BASE) MCG/ACT inhaler Inhale 2 puffs into the lungs every 6 (six) hours as needed for wheezing. 1 Inhaler 11  . amLODipine (NORVASC) 10 MG tablet Take 1 tablet (10 mg total) by mouth daily. 90 tablet 3  . aspirin EC 81 MG tablet Take 81 mg by mouth daily.    Marland Kitchen atorvastatin (LIPITOR) 80 MG tablet Take 1 tablet (80 mg total) by mouth  daily at 6 PM. 90 tablet 3  . carvedilol (COREG) 25 MG tablet Take 2 tablets (50 mg total) by mouth 2 (two) times daily. 360 tablet 3  . chlorpheniramine-HYDROcodone (TUSSIONEX) 10-8 MG/5ML LQCR Take 5 mLs by mouth every 12 (twelve) hours as needed for cough. 115 mL 0  . fexofenadine (ALLEGRA) 180 MG tablet Take 180 mg by mouth daily.    Marland Kitchen FISH OIL-CHOLECALCIFEROL PO Take 1 capsule by mouth daily.      . furosemide (LASIX) 20 MG tablet Take 1 tablet (20 mg total) by mouth daily. 180 tablet 0  . hydrALAZINE (APRESOLINE) 50 MG tablet  Take 1 tablet (50 mg total) by mouth 3 (three) times daily. 90 tablet 12  . isosorbide mononitrate (IMDUR) 30 MG 24 hr tablet Take 1 tablet (30 mg total) by mouth daily. 90 tablet 1  . Multiple Vitamins-Minerals (MULTIVITAMIN WITH MINERALS) tablet Take 1 tablet by mouth daily.      . nitroGLYCERIN (NITROSTAT) 0.4 MG SL tablet Place 1 tablet (0.4 mg total) under the tongue every 5 (five) minutes as needed for chest pain. 25 tablet 3  . pantoprazole (PROTONIX) 40 MG tablet TAKE ONE TABLET BY MOUTH ONCE DAILY 90 tablet 0  . spironolactone (ALDACTONE) 25 MG tablet Take 1 tablet (25 mg total) by mouth daily. 90 tablet 3  . valsartan (DIOVAN) 160 MG tablet TAKE ONE TABLET BY MOUTH TWICE DAILY 180 tablet 0   No current facility-administered medications on file prior to visit.     BP 114/68 (BP Location: Right Arm, Patient Position: Sitting, Cuff Size: Large)   Pulse 84   Temp 97.7 F (36.5 C) (Oral)   Ht  (1.753 m)   Wt (!) 403 lb 14.4 oz (183.2 kg)   SpO2 95%   BMI 59.65 kg/m       Objective:   Physical Exam  Constitutional: He is oriented to person, place, and time. He appears well-developed and well-nourished.  Morbidly obese   HENT:  Right Ear: Tympanic membrane normal.  Left Ear: Tympanic membrane normal.  Nose: Rhinorrhea present. Right sinus exhibits no maxillary sinus tenderness and no frontal sinus tenderness. Left sinus exhibits no maxillary sinus tenderness and no frontal sinus tenderness.  Mouth/Throat: Mucous membranes are normal. No oropharyngeal exudate or posterior oropharyngeal erythema.  Cerumen present in left ear. Irrigation completed without difficulty by CMA .  Eyes: Pupils are equal, round, and reactive to light. No scleral icterus.  Neck: Neck supple.  Cardiovascular: Normal rate and regular rhythm.   Pulmonary/Chest: Effort normal and breath sounds normal. He has no wheezes. He has no rales.  Lymphadenopathy:    He has no cervical adenopathy.    Neurological: He is alert and oriented to person, place, and time.  Skin: Skin is warm and dry. No rash noted.  Psychiatric: He has a normal mood and affect. His behavior is normal. Judgment and thought content normal.       Assessment & Plan:  1. Acute upper respiratory infection Advised patient on supportive measures:  Get rest, drink plenty of fluids, and use follow up if fever >101, if symptoms worsen or if symptoms are not improved in 3 to 4 days.   2. Allergic rhinitis, unspecified allergic rhinitis type Continue Allegra and flonase can be added for symptoms.  Advised patient to follow up if symptoms do not improve in 3 to 4 days, worsen, or he develops a fever >101, or SOB.  Roddie Mc, FNP-C

## 2016-01-14 NOTE — Patient Instructions (Signed)
Please get rest, drink plenty of fluids, and follow up if fever >101, if symptoms worsen or if symptoms are not improved in 3 to 4 days. Please continue treating your allergies with Allegra. You can add flonase for nasal symptoms during this time.     Upper Respiratory Infection, Adult Most upper respiratory infections (URIs) are a viral infection of the air passages leading to the lungs. A URI affects the nose, throat, and upper air passages. The most common type of URI is nasopharyngitis and is typically referred to as "the common cold." URIs run their course and usually go away on their own. Most of the time, a URI does not require medical attention, but sometimes a bacterial infection in the upper airways can follow a viral infection. This is called a secondary infection. Sinus and middle ear infections are common types of secondary upper respiratory infections. Bacterial pneumonia can also complicate a URI. A URI can worsen asthma and chronic obstructive pulmonary disease (COPD). Sometimes, these complications can require emergency medical care and may be life threatening.  CAUSES Almost all URIs are caused by viruses. A virus is a type of germ and can spread from one person to another.  RISKS FACTORS You may be at risk for a URI if:   You smoke.   You have chronic heart or lung disease.  You have a weakened defense (immune) system.   You are very young or very old.   You have nasal allergies or asthma.  You work in crowded or poorly ventilated areas.  You work in health care facilities or schools. SIGNS AND SYMPTOMS  Symptoms typically develop 2-3 days after you come in contact with a cold virus. Most viral URIs last 7-10 days. However, viral URIs from the influenza virus (flu virus) can last 14-18 days and are typically more severe. Symptoms may include:   Runny or stuffy (congested) nose.   Sneezing.   Cough.   Sore throat.   Headache.   Fatigue.   Fever.    Loss of appetite.   Pain in your forehead, behind your eyes, and over your cheekbones (sinus pain).  Muscle aches.  DIAGNOSIS  Your health care provider may diagnose a URI by:  Physical exam.  Tests to check that your symptoms are not due to another condition such as:  Strep throat.  Sinusitis.  Pneumonia.  Asthma. TREATMENT  A URI goes away on its own with time. It cannot be cured with medicines, but medicines may be prescribed or recommended to relieve symptoms. Medicines may help:  Reduce your fever.  Reduce your cough.  Relieve nasal congestion. HOME CARE INSTRUCTIONS   Take medicines only as directed by your health care provider.   Gargle warm saltwater or take cough drops to comfort your throat as directed by your health care provider.  Use a warm mist humidifier or inhale steam from a shower to increase air moisture. This may make it easier to breathe.  Drink enough fluid to keep your urine clear or pale yellow.   Eat soups and other clear broths and maintain good nutrition.   Rest as needed.   Return to work when your temperature has returned to normal or as your health care provider advises. You may need to stay home longer to avoid infecting others. You can also use a face mask and careful hand washing to prevent spread of the virus.  Increase the usage of your inhaler if you have asthma.   Do not use  any tobacco products, including cigarettes, chewing tobacco, or electronic cigarettes. If you need help quitting, ask your health care provider. PREVENTION  The best way to protect yourself from getting a cold is to practice good hygiene.   Avoid oral or hand contact with people with cold symptoms.   Wash your hands often if contact occurs.  There is no clear evidence that vitamin C, vitamin E, echinacea, or exercise reduces the chance of developing a cold. However, it is always recommended to get plenty of rest, exercise, and practice good  nutrition.  SEEK MEDICAL CARE IF:   You are getting worse rather than better.   Your symptoms are not controlled by medicine.   You have chills.  You have worsening shortness of breath.  You have brown or red mucus.  You have yellow or brown nasal discharge.  You have pain in your face, especially when you bend forward.  You have a fever.  You have swollen neck glands.  You have pain while swallowing.  You have white areas in the back of your throat. SEEK IMMEDIATE MEDICAL CARE IF:   You have severe or persistent:  Headache.  Ear pain.  Sinus pain.  Chest pain.  You have chronic lung disease and any of the following:  Wheezing.  Prolonged cough.  Coughing up blood.  A change in your usual mucus.  You have a stiff neck.  You have changes in your:  Vision.  Hearing.  Thinking.  Mood. MAKE SURE YOU:   Understand these instructions.  Will watch your condition.  Will get help right away if you are not doing well or get worse.   This information is not intended to replace advice given to you by your health care provider. Make sure you discuss any questions you have with your health care provider.   Document Released: 11/30/2000 Document Revised: 10/21/2014 Document Reviewed: 09/11/2013 Elsevier Interactive Patient Education [employer]

## 2016-01-15 ENCOUNTER — Ambulatory Visit (INDEPENDENT_AMBULATORY_CARE_PROVIDER_SITE_OTHER): Payer: BLUE CROSS/BLUE SHIELD | Admitting: Family Medicine

## 2016-01-15 ENCOUNTER — Encounter: Payer: Self-pay | Admitting: Family Medicine

## 2016-01-15 ENCOUNTER — Other Ambulatory Visit: Payer: Self-pay | Admitting: Cardiology

## 2016-01-15 VITALS — BP 138/82 | HR 78 | Temp 98.7°F | Ht 69.0 in

## 2016-01-15 DIAGNOSIS — J069 Acute upper respiratory infection, unspecified: Secondary | ICD-10-CM

## 2016-01-15 DIAGNOSIS — E785 Hyperlipidemia, unspecified: Secondary | ICD-10-CM

## 2016-01-15 MED ORDER — HYDROCOD POLST-CPM POLST ER 10-8 MG/5ML PO SUER: 5.0000 mL | Freq: Every evening | ORAL | 0 refills | Status: DC | PRN

## 2016-01-15 NOTE — Progress Notes (Signed)
Pre visit review using our clinic review tool, if applicable. No additional management support is needed unless otherwise documented below in the visit note. 

## 2016-01-15 NOTE — Progress Notes (Signed)
HPI:  URI: -started: 3 days ago -symptoms:nasal congestion, sore throat, cough, postnasal drip -denies:fever, SOB, NVD, tooth pain -has tried: Nothing -sick contacts/travel/risks: no reported flu, strep or tick exposure -Saw another provider yesterday, he is angry that he was not giving an antibiotic and codeine cough syrup, reports this is what his PCP always gives him for "drainage from his tonsils" -The cough is keeping him up at night and he feels that Tussionex that the only thing that helps with this   ROS: See pertinent positives and negatives per HPI.  Past Medical History:  Diagnosis Date  . ACHILLES TENDINITIS, BILATERAL 10/16/2008  . ALLERGIC RHINITIS 08/13/2009  . CARDIOMYOPATHY 04/21/2009  . CHF (congestive heart failure) (HCC)   . CONGESTIVE HEART FAILURE 12/04/2007  . CORONARY ARTERY DISEASE 12/04/2007  . GERD (gastroesophageal reflux disease) 11/01/2011  . HYPERLIPIDEMIA 03/12/2007  . Hypertension   . HYPERTENSION 03/12/2007  . OBESITY 04/21/2009  . Plantar fasciitis, bilateral 11/01/2011  . SLEEP APNEA 03/12/2007    Past Surgical History:  Procedure Laterality Date  . CARDIAC CATHETERIZATION N/A 12/19/2014   Procedure: Left Heart Cath and Coronary Angiography;  Surgeon: Lyn Records, MD;  Location: [facility] INVASIVE CV LAB;  Service: Cardiovascular;  Laterality: N/A;  . NO PAST SURGERIES      Family History  Problem Relation Age of Onset  . Asthma Brother   . Hypertension Other   . Heart attack Mother   . Heart disease Mother   . Heart attack Father   . Heart disease Father     Social History   Social History  . Marital status: Married    Spouse name: N/A  . Number of children: 2  . Years of education: N/A   Occupational History  . 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 Smoking status: Never Smoker  . Smokeless tobacco: Never Used  . Alcohol use No  . Drug use: No  . Sexual activity: Not Asked    Other Topics Concern  . None   Social History Narrative  . None     Current Outpatient Prescriptions:  .  albuterol (PROVENTIL HFA;VENTOLIN HFA) 108 (90 BASE) MCG/ACT inhaler, Inhale 2 puffs into the lungs every 6 (six) hours as needed for wheezing., Disp: 1 Inhaler, Rfl: 11 .  amLODipine (NORVASC) 10 MG tablet, Take 1 tablet (10 mg total) by mouth daily., Disp: 90 tablet, Rfl: 3 .  aspirin EC 81 MG tablet, Take 81 mg by mouth daily., Disp: , Rfl:  .  atorvastatin (LIPITOR) 80 MG tablet, Take 1 tablet (80 mg total) by mouth daily at 6 PM., Disp: 90 tablet, Rfl: 3 .  carvedilol (COREG) 25 MG tablet, Take 2 tablets (50 mg total) by mouth 2 (two) times daily., Disp: 360 tablet, Rfl: 3 .  fexofenadine (ALLEGRA) 180 MG tablet, Take 180 mg by mouth daily., Disp: , Rfl:  .  FISH OIL-CHOLECALCIFEROL PO, Take 1 capsule by mouth daily.  , Disp: , Rfl:  .  furosemide (LASIX) 20 MG tablet, Take 1 tablet (20 mg total) by mouth daily., Disp: 180 tablet, Rfl: 0 .  hydrALAZINE (APRESOLINE) 50 MG tablet, Take 1 tablet (50 mg total) by mouth 3 (three) times daily., Disp: 90 tablet, Rfl: 12 .  isosorbide mononitrate (IMDUR) 30 MG 24 hr tablet, Take 1 tablet (30 mg total) by mouth daily., Disp: 90 tablet, Rfl: 1 .  Multiple Vitamins-Minerals (MULTIVITAMIN WITH MINERALS) tablet, Take 1  tablet by mouth daily.  , Disp: , Rfl:  .  nitroGLYCERIN (NITROSTAT) 0.4 MG SL tablet, Place 1 tablet (0.4 mg total) under the tongue every 5 (five) minutes as needed for chest pain., Disp: 25 tablet, Rfl: 3 .  pantoprazole (PROTONIX) 40 MG tablet, TAKE ONE TABLET BY MOUTH ONCE DAILY, Disp: 90 tablet, Rfl: 0 .  spironolactone (ALDACTONE) 25 MG tablet, Take 1 tablet (25 mg total) by mouth daily., Disp: 90 tablet, Rfl: 3 .  valsartan (DIOVAN) 160 MG tablet, TAKE ONE TABLET BY MOUTH TWICE DAILY, Disp: 180 tablet, Rfl: 0 .  chlorpheniramine-HYDROcodone (TUSSIONEX PENNKINETIC ER) 10-8 MG/5ML SUER, Take 5 mLs by mouth at bedtime as  needed for cough., Disp: 115 mL, Rfl: 0  EXAM:  Vitals:   01/15/16 1610  BP: 138/82  Pulse: 78  Temp: 98.7 F (37.1 C)    There is no height or weight on file to calculate BMI.  GENERAL: vitals reviewed and listed above, alert, oriented, appears well hydrated and in no acute distress  HEENT: atraumatic, conjunttiva clear, no obvious abnormalities on inspection of external nose and ears, normal appearance of ear canals and TMs, clear nasal congestion, mild post oropharyngeal erythema with PND, no tonsillar edema or exudate, no sinus TTP  NECK: no obvious masses on inspection  LUNGS: clear to auscultation bilaterally, no wheezes, rales or rhonchi, good air movement  CV: HRRR, no peripheral edema  MS: moves all extremities without noticeable abnormality  PSYCH: pleasant and cooperative, no obvious depression or anxiety  ASSESSMENT AND PLAN:  Discussed the following assessment and plan:  Acute upper respiratory infection  -given HPI and exam findings today, a serious infection or illness is unlikely. We discussed potential etiologies, with VURI being most likely, and advised supportive care and monitoring. We discussed treatment side effects, likely course, antibiotic misuse, transmission, and signs of developing a serious illness. Cough medicine provided, he reports he has tolerated it well in the past and understands risks. -of course, we advised to return or notify a doctor immediately if symptoms worsen or persist or new concerns arise.    Patient Instructions  INSTRUCTIONS FOR UPPER RESPIRATORY INFECTION:  -plenty of rest and fluids  -nasal saline wash 2-3 times daily (use prepackaged nasal saline or bottled/distilled water if making your own)   -can use AFRIN nasal spray for drainage and nasal congestion - but do NOT use longer then 3-4 days  -in the winter time, using a humidifier at night is helpful (please follow cleaning instructions)  -if you are taking a  cough medication - use only as directed, may also try a teaspoon of honey to coat the throat and throat lozenges. If given a cough medication with codeine or hydrocodone or other narcotic please be advised that this contains a strong and  potentially addicting medication. Please follow instructions carefully, take as little as possible and only use AS NEEDED for severe cough. Discuss potential side effects with your pharmacy. Please do not drive or operate machinery while taking these types of medications. Please do not take other sedating medications, drugs or alcohol while taking this medication without discussing with your doctor.  -for sore throat, salt water gargles can help  -follow up if you have fevers, facial pain, tooth pain, difficulty breathing or are worsening or symptoms persist longer then expected  Upper Respiratory Infection, Adult An upper respiratory infection (URI) is also known as the common cold. It is often caused by a type of germ (virus). Colds are  easily spread (contagious). You can pass it to others by kissing, coughing, sneezing, or drinking out of the same glass. Usually, you get better in 1 to 3  weeks.  However, the cough can last for even longer. HOME CARE   Only take medicine as told by your doctor. Follow instructions provided above.  Drink enough water and fluids to keep your pee (urine) clear or pale yellow.  Get plenty of rest.  Return to work when your temperature is < 100 for 24 hours or as told by your doctor. You may use a face mask and wash your hands to stop your cold from spreading. GET HELP RIGHT AWAY IF:   After the first few days, you feel you are getting worse.  You have questions about your medicine.  You have chills, shortness of breath, or red spit (mucus).  You have pain in the face for more then 1-2 days, especially when you bend forward.  You have a fever, puffy (swollen) neck, pain when you swallow, or white spots in the back of your  throat.  You have a bad headache, ear pain, sinus pain, or chest pain.  You have a high-pitched whistling sound when you breathe in and out (wheezing).  You cough up blood.  You have sore muscles or a stiff neck. MAKE SURE YOU:   Understand these instructions.  Will watch your condition.  Will get help right away if you are not doing well or get worse. Document Released: 11/23/2007 Document Revised: 08/29/2011 Document Reviewed: 09/11/2013 [facility] Patient Information 2015 [place], [place]. This information is not intended to replace advice given to you by your health care provider. Make sure you discuss any questions you have with your health care provider.    Kriste Basque R., DO

## 2016-01-15 NOTE — Patient Instructions (Signed)
INSTRUCTIONS FOR UPPER RESPIRATORY INFECTION:  -plenty of rest and fluids  -nasal saline wash 2-3 times daily (use prepackaged nasal saline or bottled/distilled water if making your own)   -can use AFRIN nasal spray for drainage and nasal congestion - but do NOT use longer then 3-4 days  -in the winter time, using a humidifier at night is helpful (please follow cleaning instructions)  -if you are taking a cough medication - use only as directed, may also try a teaspoon of honey to coat the throat and throat lozenges. If given a cough medication with codeine or hydrocodone or other narcotic please be advised that this contains a strong and  potentially addicting medication. Please follow instructions carefully, take as little as possible and only use AS NEEDED for severe cough. Discuss potential side effects with your pharmacy. Please do not drive or operate machinery while taking these types of medications. Please do not take other sedating medications, drugs or alcohol while taking this medication without discussing with your doctor.  -for sore throat, salt water gargles can help  -follow up if you have fevers, facial pain, tooth pain, difficulty breathing or are worsening or symptoms persist longer then expected  Upper Respiratory Infection, Adult An upper respiratory infection (URI) is also known as the common cold. It is often caused by a type of germ (virus). Colds are easily spread (contagious). You can pass it to others by kissing, coughing, sneezing, or drinking out of the same glass. Usually, you get better in 1 to 3  weeks.  However, the cough can last for even longer. HOME CARE   Only take medicine as told by your doctor. Follow instructions provided above.  Drink enough water and fluids to keep your pee (urine) clear or pale yellow.  Get plenty of rest.  Return to work when your temperature is < 100 for 24 hours or as told by your doctor. You may use a face mask and wash your  hands to stop your cold from spreading. GET HELP RIGHT AWAY IF:   After the first few days, you feel you are getting worse.  You have questions about your medicine.  You have chills, shortness of breath, or red spit (mucus).  You have pain in the face for more then 1-2 days, especially when you bend forward.  You have a fever, puffy (swollen) neck, pain when you swallow, or white spots in the back of your throat.  You have a bad headache, ear pain, sinus pain, or chest pain.  You have a high-pitched whistling sound when you breathe in and out (wheezing).  You cough up blood.  You have sore muscles or a stiff neck. MAKE SURE YOU:   Understand these instructions.  Will watch your condition.  Will get help right away if you are not doing well or get worse. Document Released: 11/23/2007 Document Revised: 08/29/2011 Document Reviewed: 09/11/2013 Exit[employer] Patient Information 2015 [employer]. This information is not intended to replace advice given to you by your health care provider. Make sure you discuss any questions you have with your health care provider.  

## 2016-01-26 ENCOUNTER — Other Ambulatory Visit: Payer: Self-pay | Admitting: Cardiology

## 2016-01-26 DIAGNOSIS — I429 Cardiomyopathy, unspecified: Secondary | ICD-10-CM

## 2016-01-27 NOTE — Telephone Encounter (Signed)
Rx request sent to pharmacy.  

## 2016-03-07 ENCOUNTER — Other Ambulatory Visit: Payer: Self-pay | Admitting: Cardiology

## 2016-03-07 DIAGNOSIS — I429 Cardiomyopathy, unspecified: Secondary | ICD-10-CM

## 2016-03-08 NOTE — Telephone Encounter (Signed)
Rx request sent to pharmacy.  

## 2016-04-05 ENCOUNTER — Ambulatory Visit (INDEPENDENT_AMBULATORY_CARE_PROVIDER_SITE_OTHER): Payer: BLUE CROSS/BLUE SHIELD

## 2016-04-05 DIAGNOSIS — Z23 Encounter for immunization: Secondary | ICD-10-CM | POA: Diagnosis not present

## 2016-04-29 ENCOUNTER — Other Ambulatory Visit: Payer: Self-pay | Admitting: Cardiology

## 2016-05-02 ENCOUNTER — Other Ambulatory Visit: Payer: Self-pay

## 2016-05-02 MED ORDER — AMLODIPINE BESYLATE 10 MG PO TABS: 10.0000 mg | ORAL_TABLET | Freq: Every day | ORAL | 0 refills | Status: DC

## 2016-05-14 ENCOUNTER — Other Ambulatory Visit: Payer: Self-pay | Admitting: Cardiology

## 2016-05-14 DIAGNOSIS — E785 Hyperlipidemia, unspecified: Secondary | ICD-10-CM

## 2016-05-16 NOTE — Telephone Encounter (Signed)
REFILL 

## 2016-06-13 IMAGING — NM NM MYOCAR MULTI W/ SPECT
3 series · 18 of 18 positions shown · non-contrast
Comparison: none

[Series 1: stress · 6.51mm/px · 6 of 472 frames shown (1 of 2)]
[frame 40/472  full-range]
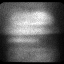
[frame 118/472  full-range]
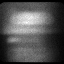
[frame 197/472  full-range]
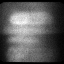
[frame 276/472  full-range]
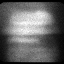
[frame 354/472  full-range]
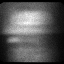
[frame 433/472  full-range]
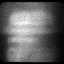

[Series 1: stress · 6.51mm/px · 6 of 64 frames shown (2 of 2)]
[frame 6/64]
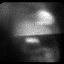
[frame 16/64]
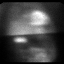
[frame 27/64]
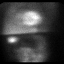
[frame 38/64]
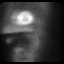
[frame 48/64]
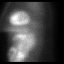
[frame 59/64]
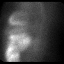

[Series 2: rest · 6.51mm/px · 6 of 64 frames shown]
[frame 6/64]
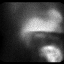
[frame 16/64]
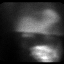
[frame 27/64]
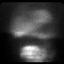
[frame 38/64]
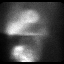
[frame 48/64]
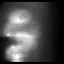
[frame 59/64]
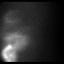

[18 of 18 positions shown; findings below may reference images not displayed]

Canned report from images found in remote index.

Refer to host system for actual result text.

## 2016-07-17 ENCOUNTER — Emergency Department (HOSPITAL_COMMUNITY): Payer: BLUE CROSS/BLUE SHIELD

## 2016-07-17 ENCOUNTER — Emergency Department (HOSPITAL_COMMUNITY)
Admission: EM | Admit: 2016-07-17 | Discharge: 2016-07-17 | Disposition: A | Discharge status: Home / Self Care | Payer: BLUE CROSS/BLUE SHIELD | Attending: Emergency Medicine | Admitting: Emergency Medicine

## 2016-07-17 ENCOUNTER — Encounter (HOSPITAL_COMMUNITY): Payer: Self-pay | Admitting: *Deleted

## 2016-07-17 DIAGNOSIS — R0789 Other chest pain: Secondary | ICD-10-CM

## 2016-07-17 DIAGNOSIS — Z7982 Long term (current) use of aspirin: Secondary | ICD-10-CM | POA: Insufficient documentation

## 2016-07-17 DIAGNOSIS — I110 Hypertensive heart disease with heart failure: Secondary | ICD-10-CM | POA: Diagnosis not present

## 2016-07-17 DIAGNOSIS — E119 Type 2 diabetes mellitus without complications: Secondary | ICD-10-CM | POA: Diagnosis not present

## 2016-07-17 DIAGNOSIS — I2510 Atherosclerotic heart disease of native coronary artery without angina pectoris: Secondary | ICD-10-CM | POA: Insufficient documentation

## 2016-07-17 DIAGNOSIS — I509 Heart failure, unspecified: Secondary | ICD-10-CM | POA: Diagnosis not present

## 2016-07-17 DIAGNOSIS — R05 Cough: Secondary | ICD-10-CM | POA: Diagnosis not present

## 2016-07-17 DIAGNOSIS — Z79899 Other long term (current) drug therapy: Secondary | ICD-10-CM | POA: Insufficient documentation

## 2016-07-17 DIAGNOSIS — R079 Chest pain, unspecified: Secondary | ICD-10-CM | POA: Diagnosis not present

## 2016-07-17 LAB — CBC
HCT: 40.4 % (ref 39.0–52.0)
Hemoglobin: 13.6 g/dL (ref 13.0–17.0)
MCH: 28.5 pg (ref 26.0–34.0)
MCHC: 33.7 g/dL (ref 30.0–36.0)
MCV: 84.5 fL (ref 78.0–100.0)
Platelets: 199 10*3/uL (ref 150–400)
RBC: 4.78 MIL/uL (ref 4.22–5.81)
RDW: 13.9 % (ref 11.5–15.5)
WBC: 7.6 10*3/uL (ref 4.0–10.5)

## 2016-07-17 LAB — BASIC METABOLIC PANEL
Anion gap: 7 (ref 5–15)
BUN: 10 mg/dL (ref 6–20)
CO2: 24 mmol/L (ref 22–32)
Calcium: 8.9 mg/dL (ref 8.9–10.3)
Chloride: 106 mmol/L (ref 101–111)
Creatinine, Ser: 0.9 mg/dL (ref 0.61–1.24)
GFR calc Af Amer: >60 mL/min (ref >60)
GFR calc non Af Amer: >60 mL/min (ref >60)
Glucose, Bld: 209 mg/dL — ABNORMAL HIGH (ref 65–99)
Potassium: 4 mmol/L (ref 3.5–5.1)
Sodium: 137 mmol/L (ref 135–145)

## 2016-07-17 LAB — BRAIN NATRIURETIC PEPTIDE: B Natriuretic Peptide: 239.4 pg/mL — ABNORMAL HIGH (ref 0.0–100.0)

## 2016-07-17 LAB — I-STAT TROPONIN, ED
Troponin i, poc: 0 ng/mL (ref 0.00–0.08)
Troponin i, poc: 0 ng/mL (ref 0.00–0.08)

## 2016-07-17 NOTE — ED Provider Notes (Signed)
[facility]-EMERGENCY DEPT Provider Note   CSN: [id number] Arrival date & time: 07/17/16  1040     History   Chief Complaint Chief Complaint  Patient presents with  . Chest Pain    HPI Tyler [name]. is a 41 y.o. male who presents with chest pain. PMH significant for CAD (medical therapy), CHF (Echo 03/2015: EF 40-45% with grade 1 diastolic dysfunction, moderate RAE, and mild TR), HTN, OSA. Cardiologist is Dr. Jens Som. Cardiac cath in July 2016 showed CAD and medical therapy was recommended. He states he has been having chest pain for the past 3 days. He'll wake up with pain in the center of his chest. He will feel this pain for about 1 hour and then it will go away. He states the pain feels like a dull ache. Feels similar to indigestion but he has not tried any antacids. He has had this pain before and that's when he was found to have blockages. He states he had a chest cold last week and took Mucinex and was feeling short of breath then however that has gotten better. He still feels fatigued and has decreased appetite from that. Denies fever, chills, leg swelling, wheezing, current shortness of breath, abdominal pain, nausea, vomiting. There is no orthopnea, PND or pedal edema. There is no syncope or palpitations. There is no exertional chest pain.  HPI  Past Medical History:  Diagnosis Date  . ACHILLES TENDINITIS, BILATERAL 10/16/2008  . ALLERGIC RHINITIS 08/13/2009  . CARDIOMYOPATHY 04/21/2009  . CHF (congestive heart failure) (HCC)   . CONGESTIVE HEART FAILURE 12/04/2007  . CORONARY ARTERY DISEASE 12/04/2007  . GERD (gastroesophageal reflux disease) 11/01/2011  . HYPERLIPIDEMIA 03/12/2007  . Hypertension   . HYPERTENSION 03/12/2007  . OBESITY 04/21/2009  . Plantar fasciitis, bilateral 11/01/2011  . SLEEP APNEA 03/12/2007    Patient Active Problem List   Diagnosis Date Noted  . Abnormal myocardial perfusion study 12/19/2014  . Mild intermittent acute asthmatic bronchitis 11/14/2014    . Wheezing 09/03/2014  . Dyspnea 03/19/2013  . Nausea alone 11/27/2012  . Plantar fasciitis, bilateral 11/01/2011  . Diabetes (HCC) 11/01/2011  . Cutaneous skin tags 11/01/2011  . GERD (gastroesophageal reflux disease) 11/01/2011  . Preventative health care 04/28/2011  . Ingrown nail 04/28/2011  . ALLERGIC RHINITIS 08/13/2009  . Morbid obesity (HCC) 04/21/2009  . CARDIOMYOPATHY 04/21/2009  . ACHILLES TENDINITIS, BILATERAL 10/16/2008  . Coronary atherosclerosis 12/04/2007  . Congestive heart failure (HCC) 12/04/2007  . Dyslipidemia 03/12/2007  . Essential hypertension 03/12/2007  . OSA (obstructive sleep apnea) 03/12/2007    Past Surgical History:  Procedure Laterality Date  . CARDIAC CATHETERIZATION N/A 12/19/2014   Procedure: Left Heart Cath and Coronary Angiography;  Surgeon: Lyn Records, MD;  Location: [facility] INVASIVE CV LAB;  Service: Cardiovascular;  Laterality: N/A;  . NO PAST SURGERIES         Home Medications    Prior to Admission medications   Medication Sig Start Date End Date Taking? Authorizing Provider  albuterol (PROVENTIL HFA;VENTOLIN HFA) 108 (90 BASE) MCG/ACT inhaler Inhale 2 puffs into the lungs every 6 (six) hours as needed for wheezing. 03/19/13   [name], MD  amLODipine (NORVASC) 10 MG tablet Take 1 tablet (10 mg total) by mouth daily. 05/02/16   Lewayne Bunting, MD  aspirin EC 81 MG tablet Take 81 mg by mouth daily.    Historical Provider, MD  atorvastatin (LIPITOR) 80 MG tablet TAKE ONE TABLET BY MOUTH ONCE DAILY AT  6  PM 05/16/16   Lewayne Bunting, MD  carvedilol (COREG) 25 MG tablet Take 2 tablets (50 mg total) by mouth 2 (two) times daily. 07/10/15   Lewayne Bunting, MD  chlorpheniramine-HYDROcodone (TUSSIONEX PENNKINETIC ER) 10-8 MG/5ML SUER Take 5 mLs by mouth at bedtime as needed for cough. 01/15/16   Terressa Koyanagi, DO  fexofenadine (ALLEGRA) 180 MG tablet Take 180 mg by mouth daily.    Historical Provider, MD  FISH OIL-CHOLECALCIFEROL PO Take  1 capsule by mouth daily.      Historical Provider, MD  furosemide (LASIX) 20 MG tablet Take 1 tablet (20 mg total) by mouth daily. 12/14/15   Lewayne Bunting, MD  hydrALAZINE (APRESOLINE) 25 MG tablet Take 1 tablet (25 mg total) by mouth 3 (three) times daily. Please call to schedule your appointment. 03/08/16   Marykay Lex, MD  isosorbide mononitrate (IMDUR) 30 MG 24 hr tablet Take 1 tablet (30 mg total) by mouth daily. 12/16/15   Lewayne Bunting, MD  Multiple Vitamins-Minerals (MULTIVITAMIN WITH MINERALS) tablet Take 1 tablet by mouth daily.      Historical Provider, MD  nitroGLYCERIN (NITROSTAT) 0.4 MG SL tablet Place 1 tablet (0.4 mg total) under the tongue every 5 (five) minutes as needed for chest pain. 12/10/14   Rosalio Macadamia, NP  pantoprazole (PROTONIX) 40 MG tablet TAKE ONE TABLET BY MOUTH ONCE DAILY 06/01/15   [name], MD  spironolactone (ALDACTONE) 25 MG tablet TAKE ONE TABLET BY MOUTH ONCE DAILY 01/27/16   Lewayne Bunting, MD  valsartan (DIOVAN) 160 MG tablet TAKE ONE TABLET BY MOUTH TWICE DAILY 01/15/16   Lewayne Bunting, MD    Family History Family History  Problem Relation Age of Onset  . Hypertension Other   . Heart attack Mother   . Heart disease Mother   . Heart attack Father   . Heart disease Father   . Asthma Brother     Social History Social History  Substance Use Topics  . Smoking status: Never Smoker  . Smokeless tobacco: Never Used  . Alcohol use No     Allergies   Patient has no known allergies.   Review of Systems Review of Systems  Constitutional: Positive for appetite change (decreased) and fatigue. Negative for chills and fever.  Respiratory: Negative for cough, shortness of breath and wheezing.   Cardiovascular: Positive for chest pain. Negative for palpitations and leg swelling.  Gastrointestinal: Negative for abdominal pain, nausea and vomiting.  All other systems reviewed and are negative.    Physical Exam Updated Vital  Signs BP 122/77 (BP Location: Left Arm)   Pulse 65   Temp 97.8 F (36.6 C) (Oral)   Resp 18   Ht 5\' 9"  (1.753 m)   Wt (!) 182.6 kg   SpO2 98%   BMI 59.45 kg/m   Physical Exam  Constitutional: He is oriented to person, place, and time. He appears well-developed and well-nourished. No distress.  Obese, NAD  HENT:  Head: Normocephalic and atraumatic.  Eyes: Conjunctivae are normal. Pupils are equal, round, and reactive to light. Right eye exhibits no discharge. Left eye exhibits no discharge. No scleral icterus.  Neck: Normal range of motion.  Cardiovascular: Normal rate and regular rhythm.  Exam reveals no gallop and no friction rub.   No murmur heard. Pulmonary/Chest: Effort normal. No respiratory distress. He has decreased breath sounds. He has no wheezes. He has no rales. He exhibits no tenderness.  Abdominal: Soft. Bowel  sounds are normal. He exhibits no distension and no mass. There is no tenderness. There is no rebound and no guarding. No hernia.  Musculoskeletal:  Trace lower leg swelling  Neurological: He is alert and oriented to person, place, and time.  Skin: Skin is warm and dry.  Psychiatric: He has a normal mood and affect. His behavior is normal.  Nursing note and vitals reviewed.    ED Treatments / Results  Labs (all labs ordered are listed, but only abnormal results are displayed) Labs Reviewed  BASIC METABOLIC PANEL - Abnormal; Notable for the following:       Result Value   Glucose, Bld 209 (*)    All other components within normal limits  BRAIN NATRIURETIC PEPTIDE - Abnormal; Notable for the following:    B Natriuretic Peptide 239.4 (*)    All other components within normal limits  CBC  I-STAT TROPOININ, ED  I-STAT TROPOININ, ED    EKG  EKG Interpretation  Date/Time:  Sunday July 17 2016 10:49:52 EST Ventricular Rate:  65 PR Interval:  194 QRS Duration: 82 QT Interval:  424 QTC Calculation: 440 R Axis:   48 Text Interpretation:  Normal  sinus rhythm Normal ECG No significant change was found Confirmed by CAMPOS  MD, Caryn Bee ([id number]) on 07/17/2016 1:17:19 PM       Radiology Dg Chest 2 View  Result Date: 07/17/2016 CLINICAL DATA:  Patient with cough, chest pain and fatigue for 3 days. EXAM: CHEST  2 VIEW COMPARISON:  Chest radiograph 08/31/2014. FINDINGS: Cardiac contours upper limits of normal. No consolidative pulmonary opacities. No pleural effusion or pneumothorax. Regional skeleton are is unremarkable. IMPRESSION: No active cardiopulmonary disease. Electronically Signed   By: Annia Belt M.D.   On: 07/17/2016 11:45    Procedures Procedures (including critical care time)  Medications Ordered in ED Medications - No data to display   Initial Impression / Assessment and Plan / ED Course  I have reviewed the triage vital signs and the nursing notes.  Pertinent labs & imaging results that were available during my care of the patient were reviewed by me and considered in my medical decision making (see chart for details).  Chest pain work up is reassuring. EKG is NSR and shows no significant change since last. CXR is negative. Initial and second troponin is 0. Labs remarkable for hyperglycemia and mildly elevated BNP (239). He does not appear significantly volume overloaded. HEART score is 2. PERC neg. Previous cardiac work up by Dr. Jens Som. F/u with Cardiology.   Final Clinical Impressions(s) / ED Diagnoses   Final diagnoses:  Atypical chest pain    New Prescriptions New Prescriptions   No medications on file     Bethel Born, PA-C 07/18/16 1004    Azalia Bilis, MD 07/18/16 1622

## 2016-07-17 NOTE — ED Notes (Signed)
PA at bedside.

## 2016-07-17 NOTE — ED Triage Notes (Signed)
Pt reports mid chest discomfort and fatigue that started wed. No resp distress noted. Pt has cardiac hx. ekg done at triage.

## 2016-07-17 NOTE — Discharge Instructions (Signed)
Please follow up with Dr. Jens Som

## 2016-07-22 ENCOUNTER — Ambulatory Visit: Payer: BLUE CROSS/BLUE SHIELD | Admitting: Physician Assistant

## 2016-07-22 ENCOUNTER — Telehealth: Payer: Self-pay | Admitting: *Deleted

## 2016-07-22 NOTE — Telephone Encounter (Signed)
Spoke to patient Follow up call from 07/21/16.  07/22/16 appointment was cancelled due to provider. Patient states he will be able to come to appointment 07/25/16 with Dr Jens Som. Patient state he did not know that he had an appointment for Monday ,but he will be here. Patient states he will need a note for work. RN informed patient note will be available for pick up - patient states he will pick it up on Monday  LETTER PLACED AT FRONT DESK

## 2016-07-25 ENCOUNTER — Encounter: Payer: Self-pay | Admitting: Cardiology

## 2016-07-25 ENCOUNTER — Ambulatory Visit (INDEPENDENT_AMBULATORY_CARE_PROVIDER_SITE_OTHER): Payer: BLUE CROSS/BLUE SHIELD | Admitting: Cardiology

## 2016-07-25 ENCOUNTER — Encounter: Payer: Self-pay | Admitting: *Deleted

## 2016-07-25 VITALS — BP 126/80 | HR 66 | Ht 69.0 in | Wt >= 6400 oz

## 2016-07-25 DIAGNOSIS — I428 Other cardiomyopathies: Secondary | ICD-10-CM | POA: Diagnosis not present

## 2016-07-25 DIAGNOSIS — I10 Essential (primary) hypertension: Secondary | ICD-10-CM | POA: Diagnosis not present

## 2016-07-25 DIAGNOSIS — E7800 Pure hypercholesterolemia, unspecified: Secondary | ICD-10-CM

## 2016-07-25 DIAGNOSIS — I2510 Atherosclerotic heart disease of native coronary artery without angina pectoris: Secondary | ICD-10-CM | POA: Diagnosis not present

## 2016-07-25 LAB — HEPATIC FUNCTION PANEL
ALT: 24 U/L (ref 9–46)
AST: 18 U/L (ref 10–40)
Albumin: 3.9 g/dL (ref 3.6–5.1)
Alkaline Phosphatase: 72 U/L (ref 40–115)
Bilirubin, Direct: 0.1 mg/dL (ref <=0.2)
Indirect Bilirubin: 0.3 mg/dL (ref 0.2–1.2)
Total Bilirubin: 0.4 mg/dL (ref 0.2–1.2)
Total Protein: 6.9 g/dL (ref 6.1–8.1)

## 2016-07-25 LAB — LIPID PANEL
Cholesterol: 111 mg/dL (ref <200)
HDL: 35 mg/dL — ABNORMAL LOW (ref >40)
LDL Cholesterol: 66 mg/dL (ref <100)
Total CHOL/HDL Ratio: 3.2 Ratio (ref <5.0)
Triglycerides: 51 mg/dL (ref <150)
VLDL: 10 mg/dL (ref <30)

## 2016-07-25 NOTE — Patient Instructions (Signed)
Medication Instructions:   NO CHANGE  Labwork:  Your physician recommends that you HAVE LAB WORK TODAY  Follow-Up:  Your physician wants you to follow-up in: ONE YEAR WITH DR CRENSHAW You will receive a reminder letter in the mail two months in advance. If you don't receive a letter, please call our office to schedule the follow-up appointment.   If you need a refill on your cardiac medications before your next appointment, please call your pharmacy.    

## 2016-07-25 NOTE — Progress Notes (Signed)
HPI: FU hypertension and nonischemic cardiomyopathy. Patient had a previous nonischemic cardiomyopathy which improved on fu echos. However he was seen recently for chest pain. Nuclear study June 2016 showed an ejection fraction of 37%. There is an old inferior/inferior lateral infarct with peri-infarct ischemia. Cardiac catheterization July 2016 showed a 90% first posterior lateral branch, 90% distal LAD and 50% proximal RCA. Medical therapy recommended. Ejection fraction 25-35%. Echo repeated 10/16 on therapy and EF 40-45, grade 1 diastolic dysfunction, moderate RAE, mild TR. Seen in the emergency room January 28 with atypical chest pain. Troponins normal. Chest x-ray negative. Hgb 13.6. BNP - 239. Since last seen, the patient has dyspnea with more extreme activities but not with routine activities. It is relieved with rest. It is not associated with chest pain. There is no orthopnea, PND or pedal edema. There is no syncope or palpitations. Patient's episode of chest pain was on the right. He does not have significant exertional chest pain.   Current Outpatient Prescriptions  Medication Sig Dispense Refill  . albuterol (PROVENTIL HFA;VENTOLIN HFA) 108 (90 BASE) MCG/ACT inhaler Inhale 2 puffs into the lungs every 6 (six) hours as needed for wheezing. 1 Inhaler 11  . amLODipine (NORVASC) 10 MG tablet Take 1 tablet (10 mg total) by mouth daily. 30 tablet 0  . aspirin EC 81 MG tablet Take 81 mg by mouth daily.    Marland Kitchen atorvastatin (LIPITOR) 80 MG tablet TAKE ONE TABLET BY MOUTH ONCE DAILY AT  6  PM 90 tablet 0  . carvedilol (COREG) 25 MG tablet Take 2 tablets (50 mg total) by mouth 2 (two) times daily. 360 tablet 3  . chlorpheniramine-HYDROcodone (TUSSIONEX PENNKINETIC ER) 10-8 MG/5ML SUER Take 5 mLs by mouth at bedtime as needed for cough. 115 mL 0  . fexofenadine (ALLEGRA) 180 MG tablet Take 180 mg by mouth daily.    Marland Kitchen FISH OIL-CHOLECALCIFEROL PO Take 1 capsule by mouth daily.      . furosemide  (LASIX) 20 MG tablet Take 1 tablet (20 mg total) by mouth daily. 180 tablet 0  . hydrALAZINE (APRESOLINE) 25 MG tablet Take 1 tablet (25 mg total) by mouth 3 (three) times daily. Please call to schedule your appointment. 90 tablet 2  . isosorbide mononitrate (IMDUR) 30 MG 24 hr tablet Take 1 tablet (30 mg total) by mouth daily. 90 tablet 1  . Multiple Vitamins-Minerals (MULTIVITAMIN WITH MINERALS) tablet Take 1 tablet by mouth daily.      . nitroGLYCERIN (NITROSTAT) 0.4 MG SL tablet Place 1 tablet (0.4 mg total) under the tongue every 5 (five) minutes as needed for chest pain. 25 tablet 3  . pantoprazole (PROTONIX) 40 MG tablet TAKE ONE TABLET BY MOUTH ONCE DAILY 90 tablet 0  . spironolactone (ALDACTONE) 25 MG tablet TAKE ONE TABLET BY MOUTH ONCE DAILY 30 tablet 6  . valsartan (DIOVAN) 160 MG tablet TAKE ONE TABLET BY MOUTH TWICE DAILY 180 tablet 0   No current facility-administered medications for this visit.      Past Medical History:  Diagnosis Date  . ACHILLES TENDINITIS, BILATERAL 10/16/2008  . ALLERGIC RHINITIS 08/13/2009  . CARDIOMYOPATHY 04/21/2009  . CHF (congestive heart failure) (HCC)   . CONGESTIVE HEART FAILURE 12/04/2007  . CORONARY ARTERY DISEASE 12/04/2007  . GERD (gastroesophageal reflux disease) 11/01/2011  . HYPERLIPIDEMIA 03/12/2007  . Hypertension   . HYPERTENSION 03/12/2007  . OBESITY 04/21/2009  . Plantar fasciitis, bilateral 11/01/2011  . SLEEP APNEA 03/12/2007    Past Surgical  History:  Procedure Laterality Date  . CARDIAC CATHETERIZATION N/A 12/19/2014   Procedure: Left Heart Cath and Coronary Angiography;  Surgeon: Lyn Records, MD;  Location: [facility] INVASIVE CV LAB;  Service: Cardiovascular;  Laterality: N/A;  . NO PAST SURGERIES      Social History   Social History  . Marital status: Married    Spouse name: N/A  . Number of children: 2  . Years of education: N/A   Occupational History  . 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 Smoking status: Never Smoker  . Smokeless tobacco: Never Used  . Alcohol use No  . Drug use: No  . Sexual activity: Not on file   Other Topics Concern  . Not on file   Social History Narrative  . No narrative on file    Family History  Problem Relation Age of Onset  . Hypertension Other   . Heart attack Mother   . Heart disease Mother   . Heart attack Father   . Heart disease Father   . Asthma Brother     ROS: no fevers or chills, productive cough, hemoptysis, dysphasia, odynophagia, melena, hematochezia, dysuria, hematuria, rash, seizure activity, orthopnea, PND, pedal edema, claudication. Remaining systems are negative.  Physical Exam: Well-developed morbidly obese in no acute distress.  Skin is warm and dry.  HEENT is normal.  Neck is supple.  Chest is clear to auscultation with normal expansion.  Cardiovascular exam is regular rate and rhythm.  Abdominal exam nontender or distended. No masses palpated. Extremities show trace edema. neuro grossly intact  ECG 07/18/2015-sinus rhythm with no ST changes.  A/P  1 hyperlipidemia-continue statin. Check lipids and liver.  2 cardiomyopathy-improved on most recent echocardiogram. Continue beta blocker and ARB.  3 congestive heart failure-chronic combined systolic/diastolic-continue present dose of diuretics. He will take an additional 20 mg as needed for increasing pedal edema or dyspnea.  4 hypertension-blood pressure controlled. Continue present medications.  5 morbid obesity-we discussed the importance of weight loss.   6 coronary artery disease-continue aspirin and statin.   7 chest pain-symptoms are atypical. I will not pursue further ischemia evaluation at this point.     Olga Millers MD

## 2016-07-26 ENCOUNTER — Encounter: Payer: Self-pay | Admitting: *Deleted

## 2016-07-30 ENCOUNTER — Other Ambulatory Visit: Payer: Self-pay | Admitting: Cardiology

## 2016-07-30 DIAGNOSIS — I429 Cardiomyopathy, unspecified: Secondary | ICD-10-CM

## 2016-08-16 ENCOUNTER — Other Ambulatory Visit: Payer: Self-pay | Admitting: Cardiology

## 2016-09-05 ENCOUNTER — Other Ambulatory Visit: Payer: Self-pay | Admitting: Cardiology

## 2016-09-05 DIAGNOSIS — E785 Hyperlipidemia, unspecified: Secondary | ICD-10-CM

## 2016-09-24 ENCOUNTER — Other Ambulatory Visit: Payer: Self-pay | Admitting: Cardiology

## 2016-09-27 ENCOUNTER — Ambulatory Visit (INDEPENDENT_AMBULATORY_CARE_PROVIDER_SITE_OTHER): Payer: BLUE CROSS/BLUE SHIELD | Admitting: Internal Medicine

## 2016-09-27 ENCOUNTER — Encounter: Payer: Self-pay | Admitting: Internal Medicine

## 2016-09-27 VITALS — BP 122/76 | HR 54 | Temp 98.0°F | Resp 16 | Ht 69.0 in | Wt >= 6400 oz

## 2016-09-27 DIAGNOSIS — M545 Low back pain, unspecified: Secondary | ICD-10-CM

## 2016-09-27 DIAGNOSIS — I10 Essential (primary) hypertension: Secondary | ICD-10-CM | POA: Diagnosis not present

## 2016-09-27 NOTE — Patient Instructions (Signed)

## 2016-09-27 NOTE — Progress Notes (Signed)
Subjective:  Patient ID: Tyler [name], male    DOB: [birth year]  Age: 41 y.o. MRN: [id number]  CC: Back Pain and Hypertension   HPI Tyler [name] presents for A 1 day history of low back pain, no history of trauma. He woke up this morning with a dull ache in his right lower back. The pain becomes sharp with movement. There is no radiation of the pain. He denies numbness, weakness, tingling, bowel or bladder incontinence or retention, he has taken Motrin and gotten significant symptom relief.    ROS Review of Systems  Constitutional: Negative.   HENT: Negative.   Eyes: Negative.  Negative for visual disturbance.  Respiratory: Negative for chest tightness, shortness of breath and wheezing.   Cardiovascular: Negative for chest pain, palpitations and leg swelling.  Gastrointestinal: Negative.  Negative for abdominal pain, blood in stool, diarrhea and nausea.  Endocrine: Negative.   Genitourinary: Negative.  Negative for difficulty urinating.  Musculoskeletal: Positive for back pain. Negative for myalgias and neck pain.  Skin: Negative.   Allergic/Immunologic: Negative.   Neurological: Negative.  Negative for dizziness.  Hematological: Negative for adenopathy. Does not bruise/bleed easily.  Psychiatric/Behavioral: Negative.     Objective:  BP 122/76 (BP Location: Left Arm, Patient Position: Sitting, Cuff Size: Large)   Pulse (!) 54   Temp 98 F (36.7 C) (Oral)   Resp 16   Ht 5\' 9"  (1.753 m)   Wt (!) 400 lb 0.6 oz (181.5 kg)   SpO2 97%   BMI 59.08 kg/m   BP Readings from Last 3 Encounters:  09/27/16 122/76  07/25/16 126/80  07/17/16 112/67    Wt Readings from Last 3 Encounters:  09/27/16 (!) 400 lb 0.6 oz (181.5 kg)  07/25/16 (!) 408 lb (185.1 kg)  07/17/16 (!) 402 lb 9.6 oz (182.6 kg)    Physical Exam  Constitutional: He is oriented to person, place, and time. No distress.  HENT:  Mouth/Throat: Oropharynx is clear and moist. No oropharyngeal exudate.    Eyes: Conjunctivae are normal. Right eye exhibits no discharge. Left eye exhibits no discharge. No scleral icterus.  Neck: Normal range of motion. Neck supple. No JVD present. No tracheal deviation present. No thyromegaly present.  Cardiovascular: Normal rate, regular rhythm, normal heart sounds and intact distal pulses.  Exam reveals no gallop and no friction rub.   No murmur heard. Pulmonary/Chest: Effort normal and breath sounds normal. No stridor. No respiratory distress. He has no wheezes. He has no rales. He exhibits no tenderness.  Abdominal: Soft. Bowel sounds are normal. He exhibits no distension and no mass. There is no tenderness. There is no rebound and no guarding.  Musculoskeletal: Normal range of motion. He exhibits no edema, tenderness or deformity.       Lumbar back: Normal. He exhibits normal range of motion, no tenderness, no bony tenderness, no swelling, no edema, no deformity and no laceration.  Lymphadenopathy:    He has no cervical adenopathy.  Neurological: He is oriented to person, place, and time.  NEg SLR in BLE  Skin: Skin is dry. No rash noted. He is not diaphoretic. No erythema. No pallor.  Vitals reviewed.   Lab Results  Component Value Date   WBC 7.6 07/17/2016   HGB 13.6 07/17/2016   HCT 40.4 07/17/2016   PLT 199 07/17/2016   GLUCOSE 209 (H) 07/17/2016   CHOL 111 07/25/2016   TRIG 51 07/25/2016   HDL 35 (L) 07/25/2016   LDLDIRECT 183.6  08/02/2011   LDLCALC 66 07/25/2016   ALT 24 07/25/2016   AST 18 07/25/2016   NA 137 07/17/2016   K 4.0 07/17/2016   CL 106 07/17/2016   CREATININE 0.90 07/17/2016   BUN 10 07/17/2016   CO2 24 07/17/2016   TSH 2.38 02/11/2014   PSA 0.60 02/11/2014   INR 1.2 (H) 12/15/2014   HGBA1C 6.7 (H) 12/15/2014   MICROALBUR 3.3 (H) 02/11/2014    Dg Chest 2 View  Result Date: 07/17/2016 CLINICAL DATA:  Patient with cough, chest pain and fatigue for 3 days. EXAM: CHEST  2 VIEW COMPARISON:  Chest radiograph 08/31/2014.  FINDINGS: Cardiac contours upper limits of normal. No consolidative pulmonary opacities. No pleural effusion or pneumothorax. Regional skeleton are is unremarkable. IMPRESSION: No active cardiopulmonary disease. Electronically Signed   By: Annia Belt M.D.   On: 07/17/2016 11:45    Assessment & Plan:   Meyson was seen today for back pain and hypertension.  Diagnoses and all orders for this visit:  Essential hypertension- his blood pressure is adequately well controlled.  Acute right-sided low back pain without sciatica- he has mechanical low back pain with no evidence of radiculopathy and is getting symptom relief with ibuprofen as needed, will continue, he will let me know if he develops any new or worsening symptoms.   I have discontinued [name]'s chlorpheniramine-HYDROcodone. I am also having him maintain his multivitamin with minerals, FISH OIL-CHOLECALCIFEROL PO, fexofenadine, aspirin EC, albuterol, nitroGLYCERIN, pantoprazole, isosorbide mononitrate, spironolactone, hydrALAZINE, furosemide, valsartan, carvedilol, atorvastatin, and amLODipine.  No orders of the defined types were placed in this encounter.    Follow-up: Return if symptoms worsen or fail to improve.  Sanda Linger, MD

## 2016-09-27 NOTE — Progress Notes (Signed)
Pre visit review using our clinic review tool, if applicable. No additional management support is needed unless otherwise documented below in the visit note. 

## 2016-10-01 ENCOUNTER — Encounter: Payer: Self-pay | Admitting: Internal Medicine

## 2016-10-01 DIAGNOSIS — M545 Low back pain, unspecified: Secondary | ICD-10-CM | POA: Insufficient documentation

## 2016-10-12 ENCOUNTER — Other Ambulatory Visit: Payer: Self-pay | Admitting: Cardiology

## 2016-10-12 DIAGNOSIS — I429 Cardiomyopathy, unspecified: Secondary | ICD-10-CM

## 2016-11-02 DIAGNOSIS — G4733 Obstructive sleep apnea (adult) (pediatric): Secondary | ICD-10-CM | POA: Diagnosis not present

## 2016-11-03 ENCOUNTER — Other Ambulatory Visit: Payer: Self-pay | Admitting: Cardiology

## 2016-11-03 DIAGNOSIS — E785 Hyperlipidemia, unspecified: Secondary | ICD-10-CM

## 2016-11-19 ENCOUNTER — Other Ambulatory Visit: Payer: Self-pay | Admitting: Cardiology

## 2016-12-08 ENCOUNTER — Ambulatory Visit: Payer: Self-pay | Admitting: Podiatry

## 2016-12-31 ENCOUNTER — Other Ambulatory Visit: Payer: Self-pay | Admitting: Cardiology

## 2016-12-31 DIAGNOSIS — I429 Cardiomyopathy, unspecified: Secondary | ICD-10-CM

## 2017-01-02 NOTE — Telephone Encounter (Signed)
REFILL 

## 2017-01-03 ENCOUNTER — Telehealth: Payer: Self-pay | Admitting: Cardiology

## 2017-01-03 NOTE — Telephone Encounter (Signed)
I left a message for this patient, stating he needed to call his pharmacy and see if they are a part of the recall, if so, we will get him in touch with our pharmacy staff

## 2017-01-03 NOTE — Telephone Encounter (Signed)
New message    Pt is calling about the Valsartan recall.

## 2017-01-10 ENCOUNTER — Other Ambulatory Visit: Payer: Self-pay | Admitting: Pharmacist

## 2017-01-10 MED ORDER — LOSARTAN POTASSIUM 50 MG PO TABS: 50.0000 mg | ORAL_TABLET | Freq: Every day | ORAL | 3 refills | Status: DC

## 2017-01-10 NOTE — Telephone Encounter (Signed)
Received fax from [facility] that need alternative for valsartan.

## 2017-02-14 ENCOUNTER — Other Ambulatory Visit: Payer: Self-pay | Admitting: Cardiology

## 2017-02-14 DIAGNOSIS — I429 Cardiomyopathy, unspecified: Secondary | ICD-10-CM

## 2017-05-10 ENCOUNTER — Ambulatory Visit: Payer: BLUE CROSS/BLUE SHIELD | Admitting: Physician Assistant

## 2017-06-14 NOTE — Progress Notes (Signed)
HPI: FU hypertension and nonischemic cardiomyopathy. Patient had a previous nonischemic cardiomyopathy which improved on fu echos. However he was seen for recurrent chest pain. Nuclear study June 2016 showed an ejection fraction of 37%. There is an old inferior/inferior lateral infarct with peri-infarct ischemia. Cardiac catheterization July 2016 showed a 90% first posterior lateral branch, 90% distal LAD and 50% proximal RCA. Medical therapy recommended. Ejection fraction 25-35%. Echo repeated 10/16 on therapy and EF 40-45, grade 1 diastolic dysfunction, moderate RAE, mild TR. Since last seen, the patient denies any dyspnea on exertion, orthopnea, PND, pedal edema, palpitations, syncope or chest pain.   Current Outpatient Medications  Medication Sig Dispense Refill  . albuterol (PROVENTIL HFA;VENTOLIN HFA) 108 (90 BASE) MCG/ACT inhaler Inhale 2 puffs into the lungs every 6 (six) hours as needed for wheezing. 1 Inhaler 11  . amLODipine (NORVASC) 10 MG tablet Take 1 tablet (10 mg total) by mouth daily. 90 tablet 2  . aspirin EC 81 MG tablet Take 81 mg by mouth daily.    Marland Kitchen atorvastatin (LIPITOR) 80 MG tablet TAKE 1 TABLET BY MOUTH ONCE DAILY AT 6PM 90 tablet 2  . azithromycin (ZITHROMAX Z-PAK) 250 MG tablet 2 tab by mouth day 1, then 1 per day 6 tablet 1  . carvedilol (COREG) 25 MG tablet TAKE TWO TABLETS BY MOUTH TWICE DAILY 360 tablet 3  . chlorpheniramine-HYDROcodone (TUSSIONEX PENNKINETIC ER) 10-8 MG/5ML SUER Take 5 mLs by mouth every 12 (twelve) hours as needed for cough. 140 mL 0  . fexofenadine (ALLEGRA) 180 MG tablet Take 180 mg by mouth daily.    Marland Kitchen FISH OIL-CHOLECALCIFEROL PO Take 1 capsule by mouth daily.      . furosemide (LASIX) 20 MG tablet TAKE ONE TABLET BY MOUTH ONCE DAILY 90 tablet 3  . furosemide (LASIX) 20 MG tablet Take 1 tablet (20 mg total) by mouth daily. 90 tablet 2  . hydrALAZINE (APRESOLINE) 25 MG tablet Take 1 tablet (25 mg total) by mouth 3 (three) times daily. 270  tablet 2  . isosorbide mononitrate (IMDUR) 30 MG 24 hr tablet TAKE ONE TABLET BY MOUTH ONCE DAILY 90 tablet 3  . Multiple Vitamins-Minerals (MULTIVITAMIN WITH MINERALS) tablet Take 1 tablet by mouth daily.      . nitroGLYCERIN (NITROSTAT) 0.4 MG SL tablet Place 1 tablet (0.4 mg total) under the tongue every 5 (five) minutes as needed for chest pain. 25 tablet 3  . pantoprazole (PROTONIX) 40 MG tablet TAKE ONE TABLET BY MOUTH ONCE DAILY 90 tablet 0  . spironolactone (ALDACTONE) 25 MG tablet TAKE ONE TABLET BY MOUTH ONCE DAILY 90 tablet 2  . valsartan (DIOVAN) 160 MG tablet TAKE ONE TABLET BY MOUTH TWICE DAILY 180 tablet 3  . losartan (COZAAR) 50 MG tablet Take 1 tablet (50 mg total) by mouth daily. 90 tablet 3   No current facility-administered medications for this visit.      Past Medical History:  Diagnosis Date  . ACHILLES TENDINITIS, BILATERAL 10/16/2008  . ALLERGIC RHINITIS 08/13/2009  . CARDIOMYOPATHY 04/21/2009  . CHF (congestive heart failure) (HCC)   . CONGESTIVE HEART FAILURE 12/04/2007  . CORONARY ARTERY DISEASE 12/04/2007  . GERD (gastroesophageal reflux disease) 11/01/2011  . HYPERLIPIDEMIA 03/12/2007  . Hypertension   . HYPERTENSION 03/12/2007  . OBESITY 04/21/2009  . Plantar fasciitis, bilateral 11/01/2011  . SLEEP APNEA 03/12/2007    Past Surgical History:  Procedure Laterality Date  . CARDIAC CATHETERIZATION N/A 12/19/2014   Procedure: Left Heart Cath and  Coronary Angiography;  Surgeon: Lyn RecordsHenry W Smith, MD;  [facility];  Service: Cardiovascular;  Laterality: N/A;  . NO PAST SURGERIES      Social History   Socioeconomic History  . Marital status: Married    Spouse name: Not on file  . Number of children: 2  . Years of education: Not on file  . Highest education level: Not on file  Social Needs  . Financial resource strain: Not on file  . Food insecurity - worry: Not on file  . Food insecurity - inability: Not on file  . Transportation needs -  medical: Not on file  . Transportation needs - non-medical: Not on file  Occupational History  . 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 Smoking status: Never Smoker  . Smokeless tobacco: Never Used  Substance and Sexual Activity  . Alcohol use: No  . Drug use: No  . Sexual activity: Not on file  Other Topics Concern  . Not on file  Social History Narrative  . Not on file    Family History  Problem Relation Age of Onset  . Hypertension Other   . Heart attack Mother   . Heart disease Mother   . Heart attack Father   . Heart disease Father   . Asthma Brother     ROS: recent fever and URI but hemoptysis, dysphasia, odynophagia, melena, hematochezia, dysuria, hematuria, rash, seizure activity, orthopnea, PND, pedal edema, claudication. Remaining systems are negative.  Physical Exam: Well-developed obese in no acute distress.  Skin is warm and dry.  HEENT is normal.  Neck is supple.  Chest is clear to auscultation with normal expansion.  Cardiovascular exam is regular rate and rhythm.  Abdominal exam nontender or distended. No masses palpated. Extremities show no edema. neuro grossly intact  ECG- sinus rhythm at a rate of 63. No ST changes.personally reviewed  A/P  1 cardiomyopathy-LV function has improved on most recent echo. Continue beta blocker and ARB. Repeat echocardiogram.  2 hypertension-blood pressure is controlled. Continue present medications.  3 coronary artery disease-continue aspirin and statin.  4 hyperlipidemia-continue statin.   5 chronic combined systolic/diastolic congestive heart failure-doing well from a volume standpoint. Continue present dose of Lasix.   6 morbid obesity-we discussed the importance of weight loss.  Note patient had blood work drawn earlier today that included lipids, liver and renal function. We will await results.  Olga MillersBrian Crenshaw, MD

## 2017-06-15 ENCOUNTER — Ambulatory Visit: Payer: BLUE CROSS/BLUE SHIELD | Admitting: Internal Medicine

## 2017-06-15 ENCOUNTER — Encounter: Payer: Self-pay | Admitting: Internal Medicine

## 2017-06-15 ENCOUNTER — Other Ambulatory Visit (INDEPENDENT_AMBULATORY_CARE_PROVIDER_SITE_OTHER): Payer: BLUE CROSS/BLUE SHIELD

## 2017-06-15 ENCOUNTER — Encounter: Payer: Self-pay | Admitting: Cardiology

## 2017-06-15 ENCOUNTER — Ambulatory Visit: Payer: BLUE CROSS/BLUE SHIELD | Admitting: Cardiology

## 2017-06-15 VITALS — BP 136/86 | HR 68 | Temp 97.7°F | Ht 69.0 in | Wt 398.0 lb

## 2017-06-15 VITALS — BP 102/68 | HR 63 | Ht 69.0 in | Wt 398.0 lb

## 2017-06-15 DIAGNOSIS — I428 Other cardiomyopathies: Secondary | ICD-10-CM

## 2017-06-15 DIAGNOSIS — I10 Essential (primary) hypertension: Secondary | ICD-10-CM

## 2017-06-15 DIAGNOSIS — Z114 Encounter for screening for human immunodeficiency virus [HIV]: Secondary | ICD-10-CM

## 2017-06-15 DIAGNOSIS — J019 Acute sinusitis, unspecified: Secondary | ICD-10-CM | POA: Diagnosis not present

## 2017-06-15 DIAGNOSIS — E7800 Pure hypercholesterolemia, unspecified: Secondary | ICD-10-CM | POA: Diagnosis not present

## 2017-06-15 DIAGNOSIS — E119 Type 2 diabetes mellitus without complications: Secondary | ICD-10-CM

## 2017-06-15 DIAGNOSIS — E785 Hyperlipidemia, unspecified: Secondary | ICD-10-CM

## 2017-06-15 DIAGNOSIS — Z0001 Encounter for general adult medical examination with abnormal findings: Secondary | ICD-10-CM

## 2017-06-15 DIAGNOSIS — Z0000 Encounter for general adult medical examination without abnormal findings: Secondary | ICD-10-CM

## 2017-06-15 DIAGNOSIS — J4521 Mild intermittent asthma with (acute) exacerbation: Secondary | ICD-10-CM | POA: Diagnosis not present

## 2017-06-15 DIAGNOSIS — I2510 Atherosclerotic heart disease of native coronary artery without angina pectoris: Secondary | ICD-10-CM

## 2017-06-15 LAB — URINALYSIS, ROUTINE W REFLEX MICROSCOPIC
Bilirubin Urine: NEGATIVE
Hgb urine dipstick: NEGATIVE
Ketones, ur: NEGATIVE
Leukocytes, UA: NEGATIVE
Nitrite: NEGATIVE
RBC / HPF: NONE SEEN (ref 0–?)
Specific Gravity, Urine: 1.015 (ref 1.000–1.030)
Total Protein, Urine: NEGATIVE
Urine Glucose: NEGATIVE
Urobilinogen, UA: 0.2 (ref 0.0–1.0)
WBC, UA: NONE SEEN (ref 0–?)
pH: 5.5 (ref 5.0–8.0)

## 2017-06-15 LAB — PSA: PSA: 0.68 ng/mL (ref 0.10–4.00)

## 2017-06-15 LAB — HEPATIC FUNCTION PANEL
ALT: 26 U/L (ref 0–53)
AST: 18 U/L (ref 0–37)
Albumin: 4.1 g/dL (ref 3.5–5.2)
Alkaline Phosphatase: 72 U/L (ref 39–117)
Bilirubin, Direct: 0.1 mg/dL (ref 0.0–0.3)
Total Bilirubin: 0.5 mg/dL (ref 0.2–1.2)
Total Protein: 7.1 g/dL (ref 6.0–8.3)

## 2017-06-15 LAB — CBC WITH DIFFERENTIAL/PLATELET
Basophils Absolute: 0.1 10*3/uL (ref 0.0–0.1)
Basophils Relative: 0.8 % (ref 0.0–3.0)
Eosinophils Absolute: 0.4 10*3/uL (ref 0.0–0.7)
Eosinophils Relative: 5.9 % — ABNORMAL HIGH (ref 0.0–5.0)
HCT: 40.3 % (ref 39.0–52.0)
Hemoglobin: 13.2 g/dL (ref 13.0–17.0)
Lymphocytes Relative: 33 % (ref 12.0–46.0)
Lymphs Abs: 2.2 10*3/uL (ref 0.7–4.0)
MCHC: 32.7 g/dL (ref 30.0–36.0)
MCV: 87.8 fl (ref 78.0–100.0)
Monocytes Absolute: 1.1 10*3/uL — ABNORMAL HIGH (ref 0.1–1.0)
Monocytes Relative: 16.8 % — ABNORMAL HIGH (ref 3.0–12.0)
Neutro Abs: 2.9 10*3/uL (ref 1.4–7.7)
Neutrophils Relative %: 43.5 % (ref 43.0–77.0)
Platelets: 229 10*3/uL (ref 150.0–400.0)
RBC: 4.59 Mil/uL (ref 4.22–5.81)
RDW: 14.5 % (ref 11.5–15.5)
WBC: 6.6 10*3/uL (ref 4.0–10.5)

## 2017-06-15 LAB — LIPID PANEL
Cholesterol: 123 mg/dL (ref 0–200)
HDL: 27.4 mg/dL — ABNORMAL LOW (ref >39.00)
LDL Cholesterol: 78 mg/dL (ref 0–99)
NonHDL: 95.28
Total CHOL/HDL Ratio: 4
Triglycerides: 87 mg/dL (ref 0.0–149.0)
VLDL: 17.4 mg/dL (ref 0.0–40.0)

## 2017-06-15 LAB — MICROALBUMIN / CREATININE URINE RATIO
Creatinine,U: 99 mg/dL
Microalb Creat Ratio: 0.7 mg/g (ref 0.0–30.0)
Microalb, Ur: <0.7 mg/dL (ref 0.0–1.9)

## 2017-06-15 LAB — BASIC METABOLIC PANEL
BUN: 13 mg/dL (ref 6–23)
CO2: 28 mEq/L (ref 19–32)
Calcium: 9.1 mg/dL (ref 8.4–10.5)
Chloride: 104 mEq/L (ref 96–112)
Creatinine, Ser: 1.02 mg/dL (ref 0.40–1.50)
GFR: 84.51 mL/min (ref >60.00)
Glucose, Bld: 78 mg/dL (ref 70–99)
Potassium: 4.1 mEq/L (ref 3.5–5.1)
Sodium: 140 mEq/L (ref 135–145)

## 2017-06-15 LAB — TSH: TSH: 2.26 u[IU]/mL (ref 0.35–4.50)

## 2017-06-15 LAB — HEMOGLOBIN A1C: Hgb A1c MFr Bld: 7.4 % — ABNORMAL HIGH (ref 4.6–6.5)

## 2017-06-15 MED ORDER — HYDROCOD POLST-CPM POLST ER 10-8 MG/5ML PO SUER: 5.0000 mL | Freq: Two times a day (BID) | ORAL | 0 refills | Status: DC | PRN

## 2017-06-15 MED ORDER — AZITHROMYCIN 250 MG PO TABS: ORAL_TABLET | ORAL | 1 refills | Status: DC

## 2017-06-15 NOTE — Assessment & Plan Note (Signed)
Stable, exam clear, to continue all other medications as before - inhaler

## 2017-06-15 NOTE — Patient Instructions (Signed)

## 2017-06-15 NOTE — Progress Notes (Signed)
Subjective:    Patient ID: Tyler [name], male    DOB: 04-24-1976, 41 y.o.   MRN: [id number]  HPI  Here for wellness and f/u;  Overall doing ok;  Pt denies Chest pain, worsening SOB, DOE, wheezing, orthopnea, PND, worsening LE edema, palpitations, dizziness or syncope.  Pt denies neurological change such as new headache, facial or extremity weakness.  Pt states overall good compliance with treatment and medications, good tolerability, and has been trying to follow appropriate diet.  Pt denies worsening depressive symptoms, suicidal ideation or panic. No fever, night sweats, wt loss, loss of appetite, or other constitutional symptoms.  Pt states good ability with ADL's, has low fall risk, home safety reviewed and adequate, no other significant changes in hearing or vision  Declines flu shot, plans to see eye doctor soon Also,  Here with 2-3 days acute onset fever, facial pain, pressure, headache, general weakness and malaise, and greenish d/c, with mild ST and cough.   Pt denies polydipsia, polyuria, or low sugar symptoms such as weakness or confusion improved with po intake.  Pt states overall good compliance with meds, trying to follow lower cholesterol, diabetic diet, wt overall stable but little exercise however.    Past Medical History:  Diagnosis Date  . ACHILLES TENDINITIS, BILATERAL 10/16/2008  . ALLERGIC RHINITIS 08/13/2009  . CARDIOMYOPATHY 04/21/2009  . CHF (congestive heart failure) (HCC)   . CONGESTIVE HEART FAILURE 12/04/2007  . CORONARY ARTERY DISEASE 12/04/2007  . GERD (gastroesophageal reflux disease) 11/01/2011  . HYPERLIPIDEMIA 03/12/2007  . Hypertension   . HYPERTENSION 03/12/2007  . OBESITY 04/21/2009  . Plantar fasciitis, bilateral 11/01/2011  . SLEEP APNEA 03/12/2007   Past Surgical History:  Procedure Laterality Date  . CARDIAC CATHETERIZATION N/A 12/19/2014   Procedure: Left Heart Cath and Coronary Angiography;  Surgeon: Lyn Records, MD;  Location: [facility] INVASIVE CV LAB;   Service: Cardiovascular;  Laterality: N/A;  . NO PAST SURGERIES      reports that  has never smoked. he has never used smokeless tobacco. He reports that he does not drink alcohol or use drugs. family history includes Asthma in his brother; Heart attack in his father and mother; Heart disease in his father and mother; Hypertension in his other. No Known Allergies Current Outpatient Medications on File Prior to Visit  Medication Sig Dispense Refill  . albuterol (PROVENTIL HFA;VENTOLIN HFA) 108 (90 BASE) MCG/ACT inhaler Inhale 2 puffs into the lungs every 6 (six) hours as needed for wheezing. 1 Inhaler 11  . amLODipine (NORVASC) 10 MG tablet Take 1 tablet (10 mg total) by mouth daily. 90 tablet 2  . aspirin EC 81 MG tablet Take 81 mg by mouth daily.    Marland Kitchen atorvastatin (LIPITOR) 80 MG tablet TAKE 1 TABLET BY MOUTH ONCE DAILY AT 6PM 90 tablet 2  . carvedilol (COREG) 25 MG tablet TAKE TWO TABLETS BY MOUTH TWICE DAILY 360 tablet 3  . fexofenadine (ALLEGRA) 180 MG tablet Take 180 mg by mouth daily.    Marland Kitchen FISH OIL-CHOLECALCIFEROL PO Take 1 capsule by mouth daily.      . furosemide (LASIX) 20 MG tablet TAKE ONE TABLET BY MOUTH ONCE DAILY 90 tablet 3  . furosemide (LASIX) 20 MG tablet Take 1 tablet (20 mg total) by mouth daily. 90 tablet 2  . hydrALAZINE (APRESOLINE) 25 MG tablet Take 1 tablet (25 mg total) by mouth 3 (three) times daily. 270 tablet 2  . isosorbide mononitrate (IMDUR) 30 MG 24 hr tablet  TAKE ONE TABLET BY MOUTH ONCE DAILY 90 tablet 3  . Multiple Vitamins-Minerals (MULTIVITAMIN WITH MINERALS) tablet Take 1 tablet by mouth daily.      . nitroGLYCERIN (NITROSTAT) 0.4 MG SL tablet Place 1 tablet (0.4 mg total) under the tongue every 5 (five) minutes as needed for chest pain. 25 tablet 3  . pantoprazole (PROTONIX) 40 MG tablet TAKE ONE TABLET BY MOUTH ONCE DAILY 90 tablet 0  . spironolactone (ALDACTONE) 25 MG tablet TAKE ONE TABLET BY MOUTH ONCE DAILY 90 tablet 2  . valsartan (DIOVAN) 160 MG  tablet TAKE ONE TABLET BY MOUTH TWICE DAILY 180 tablet 3   No current facility-administered medications on file prior to visit.    Review of Systems Constitutional: Negative for other unusual diaphoresis, sweats, appetite or weight changes HENT: Negative for other worsening hearing loss, ear pain, facial swelling, mouth sores or neck stiffness.   Eyes: Negative for other worsening pain, redness or other visual disturbance.  Respiratory: Negative for other stridor or swelling Cardiovascular: Negative for other palpitations or other chest pain  Gastrointestinal: Negative for worsening diarrhea or loose stools, blood in stool, distention or other pain Genitourinary: Negative for hematuria, flank pain or other change in urine volume.  Musculoskeletal: Negative for myalgias or other joint swelling.  Skin: Negative for other color change, or other wound or worsening drainage.  Neurological: Negative for other syncope or numbness. Hematological: Negative for other adenopathy or swelling Psychiatric/Behavioral: Negative for hallucinations, other worsening agitation, SI, self-injury, or new decreased concentration All other system neg per pt    Objective:   Physical Exam BP 136/86   Pulse 68   Temp 97.7 F (36.5 C) (Oral)   Ht 5\' 9"  (1.753 m)   Wt (!) 398 lb (180.5 kg)   SpO2 98%   BMI 58.77 kg/m  VS noted,  Constitutional: Pt is oriented to person, place, and time. Appears well-developed and well-nourished, in no significant distress and comfortable Head: Normocephalic and atraumatic  Eyes: Conjunctivae and EOM are normal. Pupils are equal, round, and reactive to light Bilat tm's with mild erythema.  Max sinus areas mild tender.  Pharynx with mild erythema, no exudate Right Ear: External ear normal without discharge Left Ear: External ear normal without discharge Nose: Nose without discharge or deformity Mouth/Throat: Oropharynx is without other ulcerations and moist  Neck: Normal  range of motion. Neck supple. No JVD present. No tracheal deviation present or significant neck LA or mass Cardiovascular: Normal rate, regular rhythm, normal heart sounds and intact distal pulses.   Pulmonary/Chest: WOB normal and breath sounds without rales or wheezing  Abdominal: Soft. Bowel sounds are normal. NT. No HSM  Musculoskeletal: Normal range of motion. Exhibits no edema Lymphadenopathy: Has no other cervical adenopathy.  Neurological: Pt is alert and oriented to person, place, and time. Pt has normal reflexes. No cranial nerve deficit. Motor grossly intact, Gait intact Skin: Skin is warm and dry. No rash noted or new ulcerations Psychiatric:  Has normal mood and affect. Behavior is normal without agitation No other exam findings    Assessment & Plan:

## 2017-06-15 NOTE — Patient Instructions (Addendum)
Please take all new medication as prescribed - the antibiotic and the cough medicine if needed (sent to [facility])  Please continue all other medications as before, and refills have been done if requested.  Please have the pharmacy call with any other refills you may need.  Please continue your efforts at being more active, low cholesterol diet, and weight control.  You are otherwise up to date with prevention measures today.  Please keep your appointments with your specialists as you may have planned  Please go to the LAB in the Basement (turn left off the elevator) for the tests to be done today  You will be contacted by phone if any changes need to be made immediately.  Otherwise, you will receive a letter about your results with an explanation, but please check with MyChart first.  Please remember to sign up for MyChart if you have not done so, as this will be important to you in the future with finding out test results, communicating by private email, and scheduling acute appointments online when needed.  Please return in 6 months, or sooner if needed, with Lab testing done 3-5 days before

## 2017-06-16 ENCOUNTER — Encounter: Payer: Self-pay | Admitting: Internal Medicine

## 2017-06-16 ENCOUNTER — Other Ambulatory Visit: Payer: Self-pay | Admitting: Internal Medicine

## 2017-06-16 ENCOUNTER — Telehealth: Payer: Self-pay | Admitting: Internal Medicine

## 2017-06-16 ENCOUNTER — Telehealth: Payer: Self-pay

## 2017-06-16 LAB — HIV ANTIBODY (ROUTINE TESTING W REFLEX): HIV 1&2 Ab, 4th Generation: NONREACTIVE

## 2017-06-16 MED ORDER — METFORMIN HCL ER 500 MG PO TB24: 500.0000 mg | ORAL_TABLET | Freq: Every day | ORAL | 3 refills | Status: DC

## 2017-06-16 NOTE — Telephone Encounter (Signed)
Copied from CRM #[id number]. Topic: Quick Communication - See Telephone Encounter >> Jun 16, 2017  1:35 PM Louie Bun, Rosey Bath D wrote: CRM for notification. See Telephone encounter for: 06/16/17. Patient called and need to update his pharmacy location to [facility].

## 2017-06-16 NOTE — Telephone Encounter (Signed)
Updated new pharmacy...[facility]

## 2017-06-16 NOTE — Telephone Encounter (Signed)
Pt has been informed and expressed understanding.  

## 2017-06-16 NOTE — Telephone Encounter (Signed)
-----   Message from [name], MD sent at 06/16/2017 12:49 PM EST ----- Left message on MyChart, pt to cont same tx except  The test results show that your current treatment is OK, except the A1c is worsened, meaning that your Diabetes is now to the point that we start treatment with metformin ER 500 mg - 1 per day.   I will send a prescription, and you should hear from the office as well.    Ryonna Cimini to please inform pt, I will do rx

## 2017-06-18 ENCOUNTER — Encounter: Payer: Self-pay | Admitting: Internal Medicine

## 2017-06-18 NOTE — Assessment & Plan Note (Addendum)
stable overall by history and exam, recent data reviewed with pt, and pt to continue medical treatment as before,  to f/u any worsening symptoms or concerns, for f/u lab today 

## 2017-06-18 NOTE — Assessment & Plan Note (Signed)
stable overall by history and exam, recent data reviewed with pt, and pt to continue medical treatment as before,  to f/u any worsening symptoms or concerns, for lab f/u 

## 2017-06-18 NOTE — Assessment & Plan Note (Addendum)
Mild to mod, for antibx course,  to f/u any worsening symptoms or concerns  In addition to the time spent performing CPE, I spent an additional 25 minutes face to face,in which greater than 50% of this time was spent in counseling and coordination of care for patient's acute illness as documented, including the differential dx, treatment, further evaluation and other management of acute sinus infection, asthma, Dm, HTN, HLD

## 2017-06-18 NOTE — Assessment & Plan Note (Signed)

## 2017-06-18 NOTE — Assessment & Plan Note (Signed)
stable overall by history and exam, recent data reviewed with pt, and pt to continue medical treatment as before,  to f/u any worsening symptoms or concerns BP Readings from Last 3 Encounters:  06/15/17 102/68  06/15/17 136/86  09/27/16 122/76

## 2017-06-29 ENCOUNTER — Ambulatory Visit (HOSPITAL_COMMUNITY): Payer: BLUE CROSS/BLUE SHIELD | Attending: Cardiovascular Disease

## 2017-06-29 ENCOUNTER — Other Ambulatory Visit: Payer: Self-pay

## 2017-06-29 DIAGNOSIS — I2510 Atherosclerotic heart disease of native coronary artery without angina pectoris: Secondary | ICD-10-CM | POA: Diagnosis not present

## 2017-06-29 DIAGNOSIS — I10 Essential (primary) hypertension: Secondary | ICD-10-CM | POA: Diagnosis not present

## 2017-06-29 DIAGNOSIS — G4730 Sleep apnea, unspecified: Secondary | ICD-10-CM | POA: Insufficient documentation

## 2017-06-29 DIAGNOSIS — R079 Chest pain, unspecified: Secondary | ICD-10-CM | POA: Diagnosis not present

## 2017-06-29 DIAGNOSIS — I428 Other cardiomyopathies: Secondary | ICD-10-CM | POA: Diagnosis not present

## 2017-06-29 DIAGNOSIS — E785 Hyperlipidemia, unspecified: Secondary | ICD-10-CM | POA: Insufficient documentation

## 2017-06-29 DIAGNOSIS — E669 Obesity, unspecified: Secondary | ICD-10-CM | POA: Diagnosis not present

## 2017-07-09 ENCOUNTER — Emergency Department (HOSPITAL_COMMUNITY)
Admission: EM | Admit: 2017-07-09 | Discharge: 2017-07-10 | Disposition: A | Discharge status: Home / Self Care | Payer: Federal, State, Local not specified - PPO | Attending: Emergency Medicine | Admitting: Emergency Medicine

## 2017-07-09 ENCOUNTER — Emergency Department (HOSPITAL_COMMUNITY): Payer: Federal, State, Local not specified - PPO

## 2017-07-09 ENCOUNTER — Encounter (HOSPITAL_COMMUNITY): Payer: Self-pay

## 2017-07-09 ENCOUNTER — Other Ambulatory Visit: Payer: Self-pay

## 2017-07-09 DIAGNOSIS — I509 Heart failure, unspecified: Secondary | ICD-10-CM | POA: Insufficient documentation

## 2017-07-09 DIAGNOSIS — Z79899 Other long term (current) drug therapy: Secondary | ICD-10-CM | POA: Insufficient documentation

## 2017-07-09 DIAGNOSIS — I2510 Atherosclerotic heart disease of native coronary artery without angina pectoris: Secondary | ICD-10-CM | POA: Diagnosis not present

## 2017-07-09 DIAGNOSIS — E119 Type 2 diabetes mellitus without complications: Secondary | ICD-10-CM | POA: Insufficient documentation

## 2017-07-09 DIAGNOSIS — Z7984 Long term (current) use of oral hypoglycemic drugs: Secondary | ICD-10-CM | POA: Diagnosis not present

## 2017-07-09 DIAGNOSIS — R42 Dizziness and giddiness: Secondary | ICD-10-CM | POA: Diagnosis not present

## 2017-07-09 DIAGNOSIS — Z7982 Long term (current) use of aspirin: Secondary | ICD-10-CM | POA: Diagnosis not present

## 2017-07-09 DIAGNOSIS — R112 Nausea with vomiting, unspecified: Secondary | ICD-10-CM | POA: Diagnosis not present

## 2017-07-09 DIAGNOSIS — R404 Transient alteration of awareness: Secondary | ICD-10-CM | POA: Diagnosis not present

## 2017-07-09 DIAGNOSIS — R1110 Vomiting, unspecified: Secondary | ICD-10-CM | POA: Diagnosis not present

## 2017-07-09 DIAGNOSIS — I110 Hypertensive heart disease with heart failure: Secondary | ICD-10-CM | POA: Insufficient documentation

## 2017-07-09 LAB — BASIC METABOLIC PANEL
Anion gap: 10 (ref 5–15)
BUN: 10 mg/dL (ref 6–20)
CO2: 24 mmol/L (ref 22–32)
Calcium: 8.9 mg/dL (ref 8.9–10.3)
Chloride: 104 mmol/L (ref 101–111)
Creatinine, Ser: 0.91 mg/dL (ref 0.61–1.24)
GFR calc Af Amer: >60 mL/min (ref >60)
GFR calc non Af Amer: >60 mL/min (ref >60)
Glucose, Bld: 137 mg/dL — ABNORMAL HIGH (ref 65–99)
Potassium: 4.1 mmol/L (ref 3.5–5.1)
Sodium: 138 mmol/L (ref 135–145)

## 2017-07-09 LAB — CBC
HCT: 41.3 % (ref 39.0–52.0)
Hemoglobin: 13.9 g/dL (ref 13.0–17.0)
MCH: 29 pg (ref 26.0–34.0)
MCHC: 33.7 g/dL (ref 30.0–36.0)
MCV: 86.2 fL (ref 78.0–100.0)
Platelets: 211 10*3/uL (ref 150–400)
RBC: 4.79 MIL/uL (ref 4.22–5.81)
RDW: 14.5 % (ref 11.5–15.5)
WBC: 6.7 10*3/uL (ref 4.0–10.5)

## 2017-07-09 LAB — I-STAT TROPONIN, ED: Troponin i, poc: 0.01 ng/mL (ref 0.00–0.08)

## 2017-07-09 LAB — CBG MONITORING, ED: Glucose-Capillary: 108 mg/dL — ABNORMAL HIGH (ref 65–99)

## 2017-07-09 MED ORDER — ONDANSETRON HCL 4 MG PO TABS: 4.0000 mg | ORAL_TABLET | Freq: Three times a day (TID) | ORAL | 0 refills | Status: DC | PRN

## 2017-07-09 MED ORDER — MECLIZINE HCL 25 MG PO TABS
25.0000 mg | ORAL_TABLET | Freq: Once | ORAL | Status: AC
  Administered 2017-07-09: 25 mg via ORAL
  Filled 2017-07-09: qty 1

## 2017-07-09 MED ORDER — MECLIZINE HCL 25 MG PO TABS: 25.0000 mg | ORAL_TABLET | Freq: Three times a day (TID) | ORAL | 0 refills | Status: DC | PRN

## 2017-07-09 MED ORDER — DIAZEPAM 5 MG/ML IJ SOLN
5.0000 mg | Freq: Once | INTRAMUSCULAR | Status: AC
  Administered 2017-07-09: 5 mg via INTRAVENOUS
  Filled 2017-07-09: qty 2

## 2017-07-09 MED ORDER — MECLIZINE HCL 25 MG PO TABS
50.0000 mg | ORAL_TABLET | Freq: Once | ORAL | Status: AC
  Administered 2017-07-09: 50 mg via ORAL
  Filled 2017-07-09: qty 2

## 2017-07-09 MED ORDER — ONDANSETRON HCL 4 MG/2ML IJ SOLN
4.0000 mg | Freq: Once | INTRAMUSCULAR | Status: AC
  Administered 2017-07-09: 4 mg via INTRAVENOUS
  Filled 2017-07-09: qty 2

## 2017-07-09 NOTE — ED Notes (Signed)
Patient transported to MRI 

## 2017-07-09 NOTE — ED Provider Notes (Signed)
Care assumed from Bonnie Brae, PA-C at shift change with PO challenge, orthostatic vitals pending.   In brief, this patient is a 42 y.o. M who presents for evaluation of dizziness that began at 10am this morning. Associated with nausea/vomiting. Describes is as room spinning sensation. Please see note from previous provider for full H&P.    PLAN: Patient here for complaints of dizziness that began this morning.  Associate with nausea vomiting.  No red flags.  Physical exam is concerning for peripheral vertigo.  Is not complaining the chest pain shortness of breath.  Patient received meclizine on initial arrival.  EKG unremarkable.  Plan to re-eval after medications.  Plan to check orthostatic vital signs.  Plan to ambulate patient in the department.  Patient also has cerumen impaction in left ear.  There will be clean.  Will reevaluate ear.  Reevaluation.  Patient reports improvement in symptoms.  He states that he has not had any vomiting since coming to the emergency department.  Reports that dizziness has improved.  Left ear is still impacted.  Orthostatic vital signs as documented below.  Plan to ambulate patient in the department.  Orthostatic VS for the past 24 hrs:  BP- Lying Pulse- Lying BP- Sitting Pulse- Sitting BP- Standing at 0 minutes Pulse- Standing at 0 minutes  07/09/17 1630 131/70 57 141/76 57 134/78 66    Reevaluation.  Patient able to ambulate in the department without any difficulty.  He reports the dizziness has significantly improved.  On ear examination, left TM partially visualized.  Does not appear perforated but appears to have some serous changes noted.  Patient had some mild dizziness when I laid him down.  He still exhibits horizontal nystagmus.  He is very worried about going home and having repeated symptoms.  Encouraged him that we will give him medications to go home with.  We will plan to give him 1 dose of Valium here in the department.  Patient had additional  episode of vomiting and dizziness.  Given persistent symptoms, will plan to do MRI for rule out CVA.  Reviewed.  Negative for any acute abnormality.  Discussed results with patient.  Eating in the department now.   Tolerated p.o. in the department without difficulty.  He ambulated in the department without any difficulty.  Vital signs stable.  Patient stable for discharge at this time.  We will plan to send home with meclizine and Zofran for symptomatic relief. Patient had ample opportunity for questions and discussion. All patient's questions were answered with full understanding. Strict return precautions discussed. Patient expresses understanding and agreement to plan.     1. Vertigo        Rosana Hoes 07/09/17 2325    Eber Hong, MD 07/11/17 [phone]

## 2017-07-09 NOTE — ED Notes (Signed)
CBG 108. 

## 2017-07-09 NOTE — ED Provider Notes (Signed)
[facility] EMERGENCY DEPARTMENT Provider Note   CSN: [id number] Arrival date & time: 07/09/17  1224     History   Chief Complaint Chief Complaint  Patient presents with  . Dizziness    HPI Cammron Allsopp is a 42 y.o. male with history of cardiomyopathy (ES 40-45%), hypertension, CAD, hyperlipidemia, obesity here for evaluation of sudden onset dizziness described as "everything was spinning" that began around 10 AM, lasting approximately 30 seconds and followed by nonbloody, nonbilious vomiting. Dizziness is worse with head movements and laying flat on his back, better with rest and closing his eyes. Has had vomiting with every episode. Dizziness has happened periodically until arrival to ED.  He denies associated headache, vision changes, palpitations, chest pain, shortness of breath, cough, abdominal pain, difficulty walking, difficulty speaking, facial asymmetry, difficulty swallowing , weakness or numbness.  No head trauma. No h/o vertigo, CVA/TIA. No recent URI or ear infections.    HPI  Past Medical History:  Diagnosis Date  . ACHILLES TENDINITIS, BILATERAL 10/16/2008  . ALLERGIC RHINITIS 08/13/2009  . CARDIOMYOPATHY 04/21/2009  . CHF (congestive heart failure) (HCC)   . CONGESTIVE HEART FAILURE 12/04/2007  . CORONARY ARTERY DISEASE 12/04/2007  . GERD (gastroesophageal reflux disease) 11/01/2011  . HYPERLIPIDEMIA 03/12/2007  . Hypertension   . HYPERTENSION 03/12/2007  . OBESITY 04/21/2009  . Plantar fasciitis, bilateral 11/01/2011  . SLEEP APNEA 03/12/2007    Patient Active Problem List   Diagnosis Date Noted  . Acute right-sided low back pain without sciatica 10/01/2016  . Abnormal myocardial perfusion study 12/19/2014  . Mild intermittent acute asthmatic bronchitis 11/14/2014  . Acute sinus infection 10/23/2014  . Wheezing 09/03/2014  . Dyspnea 03/19/2013  . Nausea alone 11/27/2012  . Plantar fasciitis, bilateral 11/01/2011  . Diabetes (HCC)  11/01/2011  . Cutaneous skin tags 11/01/2011  . GERD (gastroesophageal reflux disease) 11/01/2011  . Encounter for well adult exam with abnormal findings 04/28/2011  . Ingrown nail 04/28/2011  . ALLERGIC RHINITIS 08/13/2009  . Morbid obesity (HCC) 04/21/2009  . CARDIOMYOPATHY 04/21/2009  . ACHILLES TENDINITIS, BILATERAL 10/16/2008  . Coronary atherosclerosis 12/04/2007  . Congestive heart failure (HCC) 12/04/2007  . Dyslipidemia 03/12/2007  . Essential hypertension 03/12/2007  . OSA (obstructive sleep apnea) 03/12/2007    Past Surgical History:  Procedure Laterality Date  . CARDIAC CATHETERIZATION N/A 12/19/2014   Procedure: Left Heart Cath and Coronary Angiography;  Surgeon: Lyn Records, MD;  Location: [facility] INVASIVE CV LAB;  Service: Cardiovascular;  Laterality: N/A;  . NO PAST SURGERIES         Home Medications    Prior to Admission medications   Medication Sig Start Date End Date Taking? Authorizing Provider  albuterol (PROVENTIL HFA;VENTOLIN HFA) 108 (90 BASE) MCG/ACT inhaler Inhale 2 puffs into the lungs every 6 (six) hours as needed for wheezing. 03/19/13   [name], MD  amLODipine (NORVASC) 10 MG tablet Take 1 tablet (10 mg total) by mouth daily. 11/03/16   Lewayne Bunting, MD  aspirin EC 81 MG tablet Take 81 mg by mouth daily.    [provider]  atorvastatin (LIPITOR) 80 MG tablet TAKE 1 TABLET BY MOUTH ONCE DAILY AT 6PM 11/03/16   Lewayne Bunting, MD  azithromycin (ZITHROMAX Z-PAK) 250 MG tablet 2 tab by mouth day 1, then 1 per day 06/15/17   [name], MD  carvedilol (COREG) 25 MG tablet TAKE TWO TABLETS BY MOUTH TWICE DAILY 08/17/16   Lewayne Bunting,  MD  chlorpheniramine-HYDROcodone (TUSSIONEX PENNKINETIC ER) 10-8 MG/5ML SUER Take 5 mLs by mouth every 12 (twelve) hours as needed for cough. 06/15/17   [name], MD  fexofenadine (ALLEGRA) 180 MG tablet Take 180 mg by mouth daily.    [provider]  FISH OIL-CHOLECALCIFEROL PO Take 1  capsule by mouth daily.      [provider]  furosemide (LASIX) 20 MG tablet TAKE ONE TABLET BY MOUTH ONCE DAILY 08/01/16   Lewayne Bunting, MD  furosemide (LASIX) 20 MG tablet Take 1 tablet (20 mg total) by mouth daily. 02/14/17   Lewayne Bunting, MD  hydrALAZINE (APRESOLINE) 25 MG tablet Take 1 tablet (25 mg total) by mouth 3 (three) times daily. 01/02/17   Marykay Lex, MD  isosorbide mononitrate (IMDUR) 30 MG 24 hr tablet TAKE ONE TABLET BY MOUTH ONCE DAILY 11/21/16   Lewayne Bunting, MD  metFORMIN (GLUCOPHAGE-XR) 500 MG 24 hr tablet Take 1 tablet (500 mg total) by mouth daily with breakfast. 06/16/17   [name], MD  Multiple Vitamins-Minerals (MULTIVITAMIN WITH MINERALS) tablet Take 1 tablet by mouth daily.      [provider]  nitroGLYCERIN (NITROSTAT) 0.4 MG SL tablet Place 1 tablet (0.4 mg total) under the tongue every 5 (five) minutes as needed for chest pain. 12/10/14   Rosalio Macadamia, NP  pantoprazole (PROTONIX) 40 MG tablet TAKE ONE TABLET BY MOUTH ONCE DAILY 06/01/15   [name], MD  spironolactone (ALDACTONE) 25 MG tablet TAKE ONE TABLET BY MOUTH ONCE DAILY 10/12/16   Lewayne Bunting, MD  valsartan (DIOVAN) 160 MG tablet TAKE ONE TABLET BY MOUTH TWICE DAILY 08/17/16   Lewayne Bunting, MD    Family History Family History  Problem Relation Age of Onset  . Hypertension Other   . Heart attack Mother   . Heart disease Mother   . Heart attack Father   . Heart disease Father   . Asthma Brother     Social History Social History   Tobacco Use  . Smoking status: Never Smoker  . Smokeless tobacco: Never Used  Substance Use Topics  . Alcohol use: No  . Drug use: No     Allergies   Patient has no known allergies.   Review of Systems Review of Systems  Gastrointestinal: Positive for nausea and vomiting.  Neurological: Positive for dizziness.  All other systems reviewed and are negative.    Physical Exam Updated Vital Signs BP  128/75   Pulse (!) 52   Temp (!) 97.5 F (36.4 C) (Oral)   Resp 17   SpO2 96%   Physical Exam  Constitutional: He appears well-developed and well-nourished.  NAD. Non toxic. AxO to self, place, time event.  HENT:  Head: Normocephalic and atraumatic.  Right Ear: External ear normal.  Left Ear: External ear normal.  Nose: Nose normal.  Mouth/Throat: Oropharynx is clear and moist.  TMs normal. No mastoid tenderness. No obvious nasal mucosal edema. No sinus tenderness.   Eyes: Conjunctivae are normal.  Neck: Normal range of motion.  Cardiovascular: Normal rate, regular rhythm and normal heart sounds.  2+ DP and radial pulses bilaterally. No LE edema or calf tenderness. No hypotension or tachycardia.   Pulmonary/Chest: Effort normal and breath sounds normal.  No hypoxia or tachypnea.   Abdominal: Soft. There is no tenderness.  Musculoskeletal: Normal range of motion.  Neurological:  Alert and oriented to self, place, time and event.  Speech is fluent  without aphasia or dysarthria.  No obvious dysphagia, tolerating oral secretions well. Normal finger-to-nose w/o dysmetria. Normal gait without vomiting or foot drag.  No pronator drift.  Strength 5/5 with hand grip and dorsiflexion/plantar flexion bilaterally.   Sensation to light touch intact in face, hands and feet. CN I and VIII not tested. CN II-XII intact bilaterally.   HiNTs Exam Pt had "sensation of room spinning" with HOB flat and left head rotation. -Head impulse: unable to visualize saccade correction back midline  -Nystagmus: horizontal nystagmus to left -Test of skey: normal vertical eye alignment   Skin: Skin is warm and dry. Capillary refill takes less than 2 seconds.  Psychiatric: He has a normal mood and affect. His behavior is normal.     ED Treatments / Results  Labs (all labs ordered are listed, but only abnormal results are displayed) Labs Reviewed  BASIC METABOLIC PANEL - Abnormal; Notable for the following  components:      Result Value   Glucose, Bld 137 (*)    All other components within normal limits  CBC  I-STAT TROPONIN, ED  CBG MONITORING, ED    EKG  EKG Interpretation  Date/Time:  Sunday July 09 2017 12:31:49 EST Ventricular Rate:  58 PR Interval:  192 QRS Duration: 94 QT Interval:  448 QTC Calculation: 439 R Axis:   74 Text Interpretation:  Sinus bradycardia Cannot rule out Anterior infarct , age undetermined T wave abnormality, consider inferior ischemia Abnormal ECG Confirmed by Rolland Porter ([id number]) on 07/09/2017 1:07:34 PM       Radiology No results found.  Procedures Procedures (including critical care time)  Medications Ordered in ED Medications  meclizine (ANTIVERT) tablet 50 mg (50 mg Oral Given 07/09/17 1536)     Initial Impression / Assessment and Plan / ED Course  I have reviewed the triage vital signs and the nursing notes.  Pertinent labs & imaging results that were available during my care of the patient were reviewed by me and considered in my medical decision making (see chart for details).    42 year old male here for dizziness described as "room spinning", brief, spontaneously resolving and worse with laying flat and head movements. No red flag symptoms associated with dizziness including headache, diplopia, dysmetria, dysarthria, dysphasia, headache, numbness or weakness, chest pain, shortness of breath, palpitations or syncope.  I personally ambulated patient who had a steady gait without ataxia or sudden onset of nausea or vomiting.  Left cerumen buildup on ENT exam normal.  No hyponatremia or other electrolyte abnormalities. Given cardiac history, troponin and EKG checked, normal.    On exam, he has signs consistent with peripheral vertigo. No hypotension. He is bradycardic however chart review reveals his heart rate is usually in the 60s, he has not endorsing symptoms of hypotension. He has left cerumen impaction, removal reveals normal tympanic  membrane. Doubt this is central cause, TIA/CVA.  Patient will be handed off to oncoming ED PA who will reassess patient after meclizine. If dizziness improves, should consider discharge with meclizine for peripheral vertigo and ENT follow-up.   Final Clinical Impressions(s) / ED Diagnoses   Final diagnoses:  Vertigo    ED Discharge Orders    None       Jerrell Mylar 07/09/17 1623    Eber Hong, MD 07/09/17 [phone]

## 2017-07-09 NOTE — ED Triage Notes (Signed)
Pt presents for evaluation of new dizziness with vomiting this AM upon wakening. Patient given 8 mg zofran in route. Patient denies diarrhea. Hx of DM and CHF.

## 2017-07-09 NOTE — ED Notes (Addendum)
Pt stating dizziness with movement. Denies any pain.

## 2017-07-09 NOTE — ED Notes (Signed)
Pt c/o dizziness that generally causes him to become nauseous.  Will inform EDP.

## 2017-07-09 NOTE — Discharge Instructions (Signed)
I suspect your symptoms are from vertigo. Please read attached information on vertigo.   Take meclizine for dizziness. Follow up with ENT within 1 week if symptoms persist.   Return to ED for headache, vision changes, chest pain, palpitations, shortness of breath, passing out, numbness or weakness

## 2017-07-18 ENCOUNTER — Ambulatory Visit: Payer: BLUE CROSS/BLUE SHIELD | Admitting: Internal Medicine

## 2017-07-18 ENCOUNTER — Encounter: Payer: Self-pay | Admitting: Internal Medicine

## 2017-07-18 VITALS — BP 124/86 | HR 63 | Temp 97.8°F | Ht 69.0 in | Wt 398.0 lb

## 2017-07-18 DIAGNOSIS — Z23 Encounter for immunization: Secondary | ICD-10-CM

## 2017-07-18 DIAGNOSIS — I10 Essential (primary) hypertension: Secondary | ICD-10-CM

## 2017-07-18 DIAGNOSIS — R42 Dizziness and giddiness: Secondary | ICD-10-CM

## 2017-07-18 DIAGNOSIS — H9192 Unspecified hearing loss, left ear: Secondary | ICD-10-CM | POA: Insufficient documentation

## 2017-07-18 DIAGNOSIS — E119 Type 2 diabetes mellitus without complications: Secondary | ICD-10-CM

## 2017-07-18 NOTE — Assessment & Plan Note (Signed)
Etiology unclear, ? BPPV, fortunately symptoms resolved, has further meds at home for recurrence, no further imaging needed,  to f/u any worsening symptoms or concerns

## 2017-07-18 NOTE — Assessment & Plan Note (Signed)
Improved with irrigation of wax impaction,  to f/u any worsening symptoms or concerns 

## 2017-07-18 NOTE — Progress Notes (Signed)
Subjective:    Patient ID: Tyler [name], male    DOB: Sep 23, 1975, 42 y.o.   MRN: [id number]  HPI  Here to f/u ED visit jan 20 with severe vertigo with n/v, ct head neg, routine labs ok, and pt tx with meclizine and antiemetic; afte 3-4 days he has improved and no further vertigo.  Still has some reduced hearing to left ear for several days, asks for wax impaction removal here.  No ear pain, drainage, fever, ST, cough, and Pt denies chest pain, increased sob or doe, wheezing, orthopnea, PND, increased LE swelling, palpitations, dizziness or syncope.  Has had some constipation with starting the metformin but o/w tolerating well, better with miralax.  Trying to do better with diet,  Pt denies polydipsia, polyuria.  Due for flu shot Past Medical History:  Diagnosis Date  . ACHILLES TENDINITIS, BILATERAL 10/16/2008  . ALLERGIC RHINITIS 08/13/2009  . CARDIOMYOPATHY 04/21/2009  . CHF (congestive heart failure) (HCC)   . CONGESTIVE HEART FAILURE 12/04/2007  . CORONARY ARTERY DISEASE 12/04/2007  . GERD (gastroesophageal reflux disease) 11/01/2011  . HYPERLIPIDEMIA 03/12/2007  . Hypertension   . HYPERTENSION 03/12/2007  . OBESITY 04/21/2009  . Plantar fasciitis, bilateral 11/01/2011  . SLEEP APNEA 03/12/2007   Past Surgical History:  Procedure Laterality Date  . CARDIAC CATHETERIZATION N/A 12/19/2014   Procedure: Left Heart Cath and Coronary Angiography;  Surgeon: Lyn Records, MD;  Location: [facility] INVASIVE CV LAB;  Service: Cardiovascular;  Laterality: N/A;  . NO PAST SURGERIES      reports that  has never smoked. he has never used smokeless tobacco. He reports that he does not drink alcohol or use drugs. family history includes Asthma in his brother; Heart attack in his father and mother; Heart disease in his father and mother; Hypertension in his other. No Known Allergies Current Outpatient Medications on File Prior to Visit  Medication Sig Dispense Refill  . albuterol (PROVENTIL HFA;VENTOLIN HFA)  108 (90 BASE) MCG/ACT inhaler Inhale 2 puffs into the lungs every 6 (six) hours as needed for wheezing. 1 Inhaler 11  . amLODipine (NORVASC) 10 MG tablet Take 1 tablet (10 mg total) by mouth daily. 90 tablet 2  . aspirin EC 81 MG tablet Take 81 mg by mouth daily.    Marland Kitchen atorvastatin (LIPITOR) 80 MG tablet TAKE 1 TABLET BY MOUTH ONCE DAILY AT 6PM (Patient taking differently: TAKE 1 TABLET (80 MG) BY MOUTH ONCE DAILY) 90 tablet 2  . carvedilol (COREG) 25 MG tablet TAKE TWO TABLETS BY MOUTH TWICE DAILY (Patient taking differently: TAKE TWO TABLETS (50 MG)  BY MOUTH DAILY) 360 tablet 3  . cetirizine (ZYRTEC) 10 MG tablet Take 10 mg by mouth daily.    . chlorpheniramine-HYDROcodone (TUSSIONEX PENNKINETIC ER) 10-8 MG/5ML SUER Take 5 mLs by mouth every 12 (twelve) hours as needed for cough. 140 mL 0  . furosemide (LASIX) 20 MG tablet TAKE ONE TABLET BY MOUTH ONCE DAILY 90 tablet 3  . furosemide (LASIX) 20 MG tablet Take 1 tablet (20 mg total) by mouth daily. 90 tablet 2  . hydrALAZINE (APRESOLINE) 25 MG tablet Take 1 tablet (25 mg total) by mouth 3 (three) times daily. (Patient taking differently: Take 50 mg by mouth daily. ) 270 tablet 2  . isosorbide mononitrate (IMDUR) 30 MG 24 hr tablet TAKE ONE TABLET BY MOUTH ONCE DAILY (Patient taking differently: TAKE ONE TABLET (30 MG) BY MOUTH ONCE DAILY) 90 tablet 3  . losartan (COZAAR) 50 MG tablet  Take 50 mg by mouth daily.  3  . meclizine (ANTIVERT) 25 MG tablet Take 1 tablet (25 mg total) by mouth 3 (three) times daily as needed for dizziness. 30 tablet 0  . metFORMIN (GLUCOPHAGE-XR) 500 MG 24 hr tablet Take 1 tablet (500 mg total) by mouth daily with breakfast. 90 tablet 3  . Multiple Vitamins-Minerals (MULTIVITAMIN WITH MINERALS) tablet Take 1 tablet by mouth daily.      . nitroGLYCERIN (NITROSTAT) 0.4 MG SL tablet Place 1 tablet (0.4 mg total) under the tongue every 5 (five) minutes as needed for chest pain. 25 tablet 3  . Omega-3 Fatty Acids (FISH OIL) 1200  MG CAPS Take 1,200 mg by mouth daily.    . ondansetron (ZOFRAN) 4 MG tablet Take 1 tablet (4 mg total) by mouth every 8 (eight) hours as needed for nausea or vomiting. 6 tablet 0  . pantoprazole (PROTONIX) 40 MG tablet TAKE ONE TABLET BY MOUTH ONCE DAILY 90 tablet 0  . PRESCRIPTION MEDICATION Inhale into the lungs at bedtime. CPAP    . spironolactone (ALDACTONE) 25 MG tablet TAKE ONE TABLET BY MOUTH ONCE DAILY (Patient taking differently: TAKE ONE TABLET (25 MG)  BY MOUTH ONCE DAILY) 90 tablet 2  . valsartan (DIOVAN) 160 MG tablet TAKE ONE TABLET BY MOUTH TWICE DAILY 180 tablet 3   No current facility-administered medications on file prior to visit.    Review of Systems  Constitutional: Negative for other unusual diaphoresis or sweats HENT: Negative for ear discharge or swelling Eyes: Negative for other worsening visual disturbances Respiratory: Negative for stridor or other swelling  Gastrointestinal: Negative for worsening distension or other blood Genitourinary: Negative for retention or other urinary change Musculoskeletal: Negative for other MSK pain or swelling Skin: Negative for color change or other new lesions Neurological: Negative for worsening tremors and other numbness  Psychiatric/Behavioral: Negative for worsening agitation or other fatigue All other system neg per pt    Objective:   Physical Exam BP 124/86   Pulse 63   Temp 97.8 F (36.6 C) (Oral)   Ht 5\' 9"  (1.753 m)   Wt (!) 398 lb (180.5 kg)   SpO2 96%   BMI 58.77 kg/m  VS noted, not ill appearing Constitutional: Pt appears in NAD HENT: Head: NCAT.  Right Ear: External ear normal.  Left Ear: External ear normal.  Left canal impactions resolved with irrigation, and hearing improved Eyes: . Pupils are equal, round, and reactive to light. Conjunctivae and EOM are normal Nose: without d/c or deformity Neck: Neck supple. Gross normal ROM Cardiovascular: Normal rate and regular rhythm.   Pulmonary/Chest: Effort  normal and breath sounds without rales or wheezing.  Abd:  Soft, NT, ND, + BS, no organomegaly Neurological: Pt is alert. At baseline orientation, motor grossly intact Skin: Skin is warm. No rashes, other new lesions, no LE edema Psychiatric: Pt behavior is normal without agitation  No other exam findings Lab Results  Component Value Date   WBC 6.7 07/09/2017   HGB 13.9 07/09/2017   HCT 41.3 07/09/2017   PLT 211 07/09/2017   GLUCOSE 137 (H) 07/09/2017   CHOL 123 06/15/2017   TRIG 87.0 06/15/2017   HDL 27.40 (L) 06/15/2017   LDLDIRECT 183.6 08/02/2011   LDLCALC 78 06/15/2017   ALT 26 06/15/2017   AST 18 06/15/2017   NA 138 07/09/2017   K 4.1 07/09/2017   CL 104 07/09/2017   CREATININE 0.91 07/09/2017   BUN 10 07/09/2017   CO2 24  07/09/2017   TSH 2.26 06/15/2017   PSA 0.68 06/15/2017   INR 1.2 (H) 12/15/2014   HGBA1C 7.4 (H) 06/15/2017   MICROALBUR <0.7 06/15/2017          Assessment & Plan:

## 2017-07-18 NOTE — Assessment & Plan Note (Signed)
stable overall by history and exam, recent data reviewed with pt, and pt to continue medical treatment as before,  to f/u any worsening symptoms or concerns BP Readings from Last 3 Encounters:  07/18/17 124/86  07/09/17 118/66  06/15/17 102/68

## 2017-07-18 NOTE — Patient Instructions (Signed)
Your left ear was irrigated of wax today  You had the flu shot today  Please continue all other medications as before, and refills have been done if requested.  Please have the pharmacy call with any other refills you may need.  Please continue your efforts at being more active, low cholesterol diabetic diet, and weight control.  Please keep your appointments with your specialists as you may have planned

## 2017-07-18 NOTE — Assessment & Plan Note (Signed)
With recent start metformin, cont diet, wt loss, meds as prescribed,  to f/u any worsening symptoms or concerns

## 2017-09-10 ENCOUNTER — Other Ambulatory Visit: Payer: Self-pay | Admitting: Cardiology

## 2017-09-10 DIAGNOSIS — I429 Cardiomyopathy, unspecified: Secondary | ICD-10-CM

## 2017-09-21 ENCOUNTER — Other Ambulatory Visit: Payer: Self-pay | Admitting: Cardiology

## 2017-10-04 ENCOUNTER — Other Ambulatory Visit: Payer: Self-pay | Admitting: Cardiology

## 2017-10-04 NOTE — Telephone Encounter (Signed)
Rx(s) sent to pharmacy electronically.  

## 2017-12-03 ENCOUNTER — Other Ambulatory Visit: Payer: Self-pay | Admitting: Cardiology

## 2017-12-03 DIAGNOSIS — E785 Hyperlipidemia, unspecified: Secondary | ICD-10-CM

## 2017-12-15 ENCOUNTER — Ambulatory Visit: Payer: Federal, State, Local not specified - PPO | Admitting: Internal Medicine

## 2017-12-15 ENCOUNTER — Encounter: Payer: Self-pay | Admitting: Internal Medicine

## 2017-12-15 VITALS — BP 124/78 | HR 62 | Temp 98.6°F | Ht 69.0 in | Wt 397.0 lb

## 2017-12-15 DIAGNOSIS — I10 Essential (primary) hypertension: Secondary | ICD-10-CM | POA: Diagnosis not present

## 2017-12-15 DIAGNOSIS — M79672 Pain in left foot: Secondary | ICD-10-CM

## 2017-12-15 DIAGNOSIS — E119 Type 2 diabetes mellitus without complications: Secondary | ICD-10-CM | POA: Diagnosis not present

## 2017-12-15 DIAGNOSIS — G4733 Obstructive sleep apnea (adult) (pediatric): Secondary | ICD-10-CM | POA: Diagnosis not present

## 2017-12-15 DIAGNOSIS — Z0000 Encounter for general adult medical examination without abnormal findings: Secondary | ICD-10-CM

## 2017-12-15 LAB — POCT GLYCOSYLATED HEMOGLOBIN (HGB A1C): Hemoglobin A1C: 6.7 % — AB (ref 4.0–5.6)

## 2017-12-15 MED ORDER — LIDOCAINE 5 % EX PTCH: 1.0000 | MEDICATED_PATCH | CUTANEOUS | 1 refills | Status: DC

## 2017-12-15 NOTE — Assessment & Plan Note (Signed)
stable overall by history and exam, recent data reviewed with pt, and pt to continue medical treatment as before,  to f/u any worsening symptoms or concerns, for f/u a1c today 

## 2017-12-15 NOTE — Assessment & Plan Note (Signed)
C/w tendonopathy, for lidoderm patch, TENS unit prn, make appt with Sports med in this office

## 2017-12-15 NOTE — Assessment & Plan Note (Signed)
Due for f/u with pulm Dr Maple Hudson

## 2017-12-15 NOTE — Assessment & Plan Note (Signed)
stable overall by history and exam, recent data reviewed with pt, and pt to continue medical treatment as before,  to f/u any worsening symptoms or concerns BP Readings from Last 3 Encounters:  12/15/17 124/78  07/18/17 124/86  07/09/17 118/66

## 2017-12-15 NOTE — Progress Notes (Addendum)
Subjective:    Patient ID: Tyler [name], male    DOB: 12-12-1975, 42 y.o.   MRN: [id number]  HPI Here to f/u; overall doing ok,  Pt denies chest pain, increasing sob or doe, wheezing, orthopnea, PND, increased LE swelling, palpitations, dizziness or syncope.  Pt denies new neurological symptoms such as new headache, or facial or extremity weakness or numbness.  Pt denies polydipsia, polyuria, or low sugar episode.  Pt states overall good compliance with meds, mostly trying to follow appropriate diet, with wt overall stable,  but little exercise however.  Vertigo from last visit resolved Wt Readings from Last 3 Encounters:  12/15/17 (!) 397 lb (180.1 kg)  07/18/17 (!) 398 lb (180.5 kg)  07/09/17 (!) 398 lb (180.5 kg)  Has recurrent left post heel /achilles area "bone spurs" with pain flare in past few wks.  Has sen orthopedic 9 yrs ago with boot and some kind of electrical stimulator.  Also due for f/u with Dr Maple Hudson for OSA Past Medical History:  Diagnosis Date  . ACHILLES TENDINITIS, BILATERAL 10/16/2008  . ALLERGIC RHINITIS 08/13/2009  . CARDIOMYOPATHY 04/21/2009  . CHF (congestive heart failure) (HCC)   . CONGESTIVE HEART FAILURE 12/04/2007  . CORONARY ARTERY DISEASE 12/04/2007  . GERD (gastroesophageal reflux disease) 11/01/2011  . HYPERLIPIDEMIA 03/12/2007  . Hypertension   . HYPERTENSION 03/12/2007  . OBESITY 04/21/2009  . Plantar fasciitis, bilateral 11/01/2011  . SLEEP APNEA 03/12/2007   Past Surgical History:  Procedure Laterality Date  . CARDIAC CATHETERIZATION N/A 12/19/2014   Procedure: Left Heart Cath and Coronary Angiography;  Surgeon: Lyn Records, MD;  Location: [facility] INVASIVE CV LAB;  Service: Cardiovascular;  Laterality: N/A;  . NO PAST SURGERIES      reports that he has never smoked. He has never used smokeless tobacco. He reports that he does not drink alcohol or use drugs. family history includes Asthma in his brother; Heart attack in his father and mother; Heart disease  in his father and mother; Hypertension in his other. No Known Allergies Current Outpatient Medications on File Prior to Visit  Medication Sig Dispense Refill  . albuterol (PROVENTIL HFA;VENTOLIN HFA) 108 (90 BASE) MCG/ACT inhaler Inhale 2 puffs into the lungs every 6 (six) hours as needed for wheezing. 1 Inhaler 11  . amLODipine (NORVASC) 10 MG tablet TAKE 1 TABLET BY MOUTH ONCE DAILY 90 tablet 0  . aspirin EC 81 MG tablet Take 81 mg by mouth daily.    Marland Kitchen atorvastatin (LIPITOR) 80 MG tablet TAKE 1 TABLET BY MOUTH ONCE DAILY 30 tablet 5  . carvedilol (COREG) 25 MG tablet Take 2 tablets (50 mg total) by mouth 2 (two) times daily. 360 tablet 1  . cetirizine (ZYRTEC) 10 MG tablet Take 10 mg by mouth daily.    . chlorpheniramine-HYDROcodone (TUSSIONEX PENNKINETIC ER) 10-8 MG/5ML SUER Take 5 mLs by mouth every 12 (twelve) hours as needed for cough. 140 mL 0  . furosemide (LASIX) 20 MG tablet Take 1 tablet (20 mg total) by mouth daily. 90 tablet 2  . furosemide (LASIX) 20 MG tablet TAKE 1 TABLET BY MOUTH ONCE DAILY 90 tablet 3  . hydrALAZINE (APRESOLINE) 25 MG tablet Take 1 tablet (25 mg total) by mouth 3 (three) times daily. (Patient taking differently: Take 50 mg by mouth daily. ) 270 tablet 2  . isosorbide mononitrate (IMDUR) 30 MG 24 hr tablet TAKE ONE TABLET BY MOUTH ONCE DAILY (Patient taking differently: TAKE ONE TABLET (30 MG) BY MOUTH  ONCE DAILY) 90 tablet 3  . losartan (COZAAR) 50 MG tablet Take 50 mg by mouth daily.  3  . meclizine (ANTIVERT) 25 MG tablet Take 1 tablet (25 mg total) by mouth 3 (three) times daily as needed for dizziness. 30 tablet 0  . metFORMIN (GLUCOPHAGE-XR) 500 MG 24 hr tablet Take 1 tablet (500 mg total) by mouth daily with breakfast. 90 tablet 3  . Multiple Vitamins-Minerals (MULTIVITAMIN WITH MINERALS) tablet Take 1 tablet by mouth daily.      . nitroGLYCERIN (NITROSTAT) 0.4 MG SL tablet Place 1 tablet (0.4 mg total) under the tongue every 5 (five) minutes as needed for  chest pain. 25 tablet 3  . Omega-3 Fatty Acids (FISH OIL) 1200 MG CAPS Take 1,200 mg by mouth daily.    . ondansetron (ZOFRAN) 4 MG tablet Take 1 tablet (4 mg total) by mouth every 8 (eight) hours as needed for nausea or vomiting. 6 tablet 0  . pantoprazole (PROTONIX) 40 MG tablet TAKE ONE TABLET BY MOUTH ONCE DAILY 90 tablet 0  . PRESCRIPTION MEDICATION Inhale into the lungs at bedtime. CPAP    . spironolactone (ALDACTONE) 25 MG tablet TAKE 1 TABLET BY MOUTH ONCE DAILY 90 tablet 3  . valsartan (DIOVAN) 160 MG tablet TAKE ONE TABLET BY MOUTH TWICE DAILY 180 tablet 3   No current facility-administered medications on file prior to visit.    Review of Systems  Constitutional: Negative for other unusual diaphoresis or sweats HENT: Negative for ear discharge or swelling Eyes: Negative for other worsening visual disturbances Respiratory: Negative for stridor or other swelling  Gastrointestinal: Negative for worsening distension or other blood Genitourinary: Negative for retention or other urinary change Musculoskeletal: Negative for other MSK pain or swelling Skin: Negative for color change or other new lesions Neurological: Negative for worsening tremors and other numbness  Psychiatric/Behavioral: Negative for worsening agitation or other fatigue All other system neg per pt    Objective:   Physical Exam BP 124/78   Pulse 62   Temp 98.6 F (37 C) (Oral)   Ht 5\' 9"  (1.753 m)   Wt (!) 397 lb (180.1 kg)   SpO2 94%   BMI 58.63 kg/m  .VS noted,  Constitutional: Pt appears in NAD HENT: Head: NCAT.  Right Ear: External ear normal.  Left Ear: External ear normal.  Eyes: . Pupils are equal, round, and reactive to light. Conjunctivae and EOM are normal Nose: without d/c or deformity Neck: Neck supple. Gross normal ROM Cardiovascular: Normal rate and regular rhythm.   Pulmonary/Chest: Effort normal and breath sounds without rales or wheezing.  Abd:  Soft, NT, ND, + BS, no  organomegaly Left post heel with moderate tender, swelling, no ulcer or rash Neurological: Pt is alert. At baseline orientation, motor grossly intact Skin: Skin is warm. No rashes, other new lesions, trace bilat LE edema Psychiatric: Pt behavior is normal without agitation  No other exam findings  POCT - A1c - today - Contains abnormal data POCT glycosylated hemoglobin (Hb A1C)  Order: [id number]  Status:  Final result Visible to patient:  No (Not Released) Dx:  Type 2 diabetes mellitus without comp...   Ref Range & Units 15:46 (12/15/17) 36mo ago (06/15/17) 47yr ago (12/15/14) 86yr ago (09/03/14)  Hemoglobin A1C 4.0 - 5.6 % 6.7Abnormal   7.4High  R, CM 6.7High  R, CM 8.3High  R           Assessment & Plan:

## 2017-12-15 NOTE — Patient Instructions (Addendum)
Your A1c was OK today  Please take all new medication as prescribed - the lidoderm patch  You can also try the TENS unit at the pharmacy for the left heel  Please make an appt as you leave with Sports Medicine in this office for the left heel pain  You will be contacted regarding the referral for: Dr Maple Hudson  Please continue all other medications as before, and refills have been done if requested.  Please have the pharmacy call with any other refills you may need.  Please continue your efforts at being more active, low cholesterol diet, and weight control.  Please keep your appointments with your specialists as you may have planned  Please return in 6 months, or sooner if needed, with Lab testing done 3-5 days before

## 2017-12-19 ENCOUNTER — Other Ambulatory Visit: Payer: Self-pay | Admitting: Internal Medicine

## 2017-12-19 DIAGNOSIS — G4733 Obstructive sleep apnea (adult) (pediatric): Secondary | ICD-10-CM

## 2018-01-12 ENCOUNTER — Other Ambulatory Visit: Payer: Self-pay | Admitting: Cardiology

## 2018-01-12 NOTE — Telephone Encounter (Signed)
Rx request sent to pharmacy.  

## 2018-01-15 ENCOUNTER — Encounter: Payer: Self-pay | Admitting: Internal Medicine

## 2018-01-15 ENCOUNTER — Ambulatory Visit: Payer: Federal, State, Local not specified - PPO | Admitting: Internal Medicine

## 2018-01-15 DIAGNOSIS — G4733 Obstructive sleep apnea (adult) (pediatric): Secondary | ICD-10-CM | POA: Diagnosis not present

## 2018-01-15 NOTE — Progress Notes (Signed)
HPI M never smoker followed for bronchitis, OSA,, allergic rhinitis complicated by CHF, GERD, morbid obesity Unattended home sleep study 08/26/13- AHI 17/ hr, weight 393 lbs ---------------------------------------------------------------------------  8/31/6-  39 yoM never smoker followed for bronchitis, OSA,, allergic rhinitis complicated by CHF, GERD, morbid obesity  FOLLOWS [name] states hes doing pretty good. using cpap every night, pt states feels like its not blowing enough air wakes up in the middle of the night. cpap set at auto. pt states has a little congestion. review pft with patient. Download-63% compliance, auto 5-20/[facility], AHI 1.4 No recent acute respiratory complaints.  01/15/2018- 42 yoM never smoker followed for bronchitis, OSA,, allergic rhinitis complicated by CHF/CM, GERD, morbid obesity Weight 396 pounds today CPAP auto 8-20/Advanced -----OSA: DME: [facility]. Pt wears CPAP for the most part and feels that pressure of 8-20 works well for him. DL attached.  Download 63 % compliance AHI 0.3/hour.  We reviewed his download together and discussed ways to use CPAP more consistently.  We also discussed weight loss.  He is morbidly overweight.  Does sleep better and does not snore if he uses CPAP. Denies recent cardiac problems, followed by cardiology.  He did not remember there was an association between untreated sleep apnea and cardiac complications.  ROS-see HPI   + = positive Constitutional:    weight loss, night sweats, fevers, chills,+fatigue, lassitude. HEENT:    headaches, difficulty swallowing, tooth/dental problems, sore throat,       sneezing, itching, ear ache, nasal congestion, post nasal drip, snoring CV:    chest pain, orthopnea, PND, swelling in lower extremities, anasarca,                                                      dizziness, palpitations Resp:   +shortness of breath with exertion or at rest.                productive cough,   non-productive cough,  coughing up of blood.              change in color of mucus.  wheezing.   Skin:    rash or lesions. GI:  No-   heartburn, indigestion, abdominal pain, nausea, vomiting,  GU:  MS:   joint pain, stiffness,  Neuro-     nothing unusual Psych:  change in mood or affect.  depression or anxiety.   memory loss.  OBJ- Physical Exam General- Alert, Oriented, Affect-appropriate, Distress- none acute, + morbid obesity Skin- rash-none, lesions- none, excoriation- none Lymphadenopathy- none Head- atraumatic            Eyes- Gross vision intact, PERRLA, conjunctivae and secretions clear            Ears- Hearing, canals-normal            Nose- Clear, no-Septal dev, mucus, polyps, erosion, perforation             Throat- Mallampati IV , mucosa clear , drainage- none, tonsils- atrophic Neck- flexible , trachea midline, no stridor , thyroid nl, carotid no bruit Chest - symmetrical excursion , unlabored           Heart/CV- RRR , no murmur , no gallop  , no rub, nl s1 s2                           -  JVD- none , edema- none, stasis changes- none, varices- none           Lung- clear to P&A, wheeze- none, cough- none , dullness-none, rub- none           Chest wall-  Abd-  Br/ Gen/ Rectal- Not done, not indicated Extrem- cyanosis- none, clubbing, none, atrophy- none, strength- nl Neuro- grossly intact to observation    .

## 2018-01-15 NOTE — Patient Instructions (Signed)
We can continue CPAP auto 8-20, mask of choice, humidifier, supplies, AirView  Please try to use your CPAP all night, every night

## 2018-01-18 NOTE — Assessment & Plan Note (Signed)
We discussed compliance goals and reviewed medical reasons for treating sleep apnea again.  He benefits from CPAP but needs to use it more consistently. Plan-continue CPAP auto 8-20

## 2018-01-18 NOTE — Assessment & Plan Note (Signed)
He has not been prepared to make lifestyle changes necessary to accomplish useful weight loss.  We discussed this again.

## 2018-02-17 ENCOUNTER — Other Ambulatory Visit: Payer: Self-pay | Admitting: Cardiology

## 2018-02-20 NOTE — Telephone Encounter (Signed)
Rx request sent to pharmacy.  

## 2018-04-04 ENCOUNTER — Other Ambulatory Visit: Payer: Self-pay | Admitting: Cardiology

## 2018-04-04 DIAGNOSIS — I429 Cardiomyopathy, unspecified: Secondary | ICD-10-CM

## 2018-06-22 ENCOUNTER — Other Ambulatory Visit (INDEPENDENT_AMBULATORY_CARE_PROVIDER_SITE_OTHER): Payer: Federal, State, Local not specified - PPO

## 2018-06-22 ENCOUNTER — Encounter: Payer: Self-pay | Admitting: Internal Medicine

## 2018-06-22 ENCOUNTER — Other Ambulatory Visit: Payer: Self-pay | Admitting: Internal Medicine

## 2018-06-22 ENCOUNTER — Ambulatory Visit: Payer: Federal, State, Local not specified - PPO | Admitting: Internal Medicine

## 2018-06-22 VITALS — BP 130/80 | HR 69 | Ht 69.0 in | Wt >= 6400 oz

## 2018-06-22 DIAGNOSIS — M25522 Pain in left elbow: Secondary | ICD-10-CM | POA: Insufficient documentation

## 2018-06-22 DIAGNOSIS — M722 Plantar fascial fibromatosis: Secondary | ICD-10-CM

## 2018-06-22 DIAGNOSIS — E119 Type 2 diabetes mellitus without complications: Secondary | ICD-10-CM

## 2018-06-22 DIAGNOSIS — Z0000 Encounter for general adult medical examination without abnormal findings: Secondary | ICD-10-CM | POA: Diagnosis not present

## 2018-06-22 DIAGNOSIS — Z23 Encounter for immunization: Secondary | ICD-10-CM

## 2018-06-22 DIAGNOSIS — I10 Essential (primary) hypertension: Secondary | ICD-10-CM | POA: Diagnosis not present

## 2018-06-22 DIAGNOSIS — Z0001 Encounter for general adult medical examination with abnormal findings: Secondary | ICD-10-CM

## 2018-06-22 LAB — CBC WITH DIFFERENTIAL/PLATELET
Basophils Absolute: 0 10*3/uL (ref 0.0–0.1)
Basophils Relative: 0.6 % (ref 0.0–3.0)
Eosinophils Absolute: 0.4 10*3/uL (ref 0.0–0.7)
Eosinophils Relative: 4.3 % (ref 0.0–5.0)
HCT: 41.7 % (ref 39.0–52.0)
Hemoglobin: 14 g/dL (ref 13.0–17.0)
Lymphocytes Relative: 29.2 % (ref 12.0–46.0)
Lymphs Abs: 2.4 10*3/uL (ref 0.7–4.0)
MCHC: 33.5 g/dL (ref 30.0–36.0)
MCV: 86.9 fl (ref 78.0–100.0)
Monocytes Absolute: 0.8 10*3/uL (ref 0.1–1.0)
Monocytes Relative: 10 % (ref 3.0–12.0)
Neutro Abs: 4.6 10*3/uL (ref 1.4–7.7)
Neutrophils Relative %: 55.9 % (ref 43.0–77.0)
Platelets: 220 10*3/uL (ref 150.0–400.0)
RBC: 4.8 Mil/uL (ref 4.22–5.81)
RDW: 14.1 % (ref 11.5–15.5)
WBC: 8.2 10*3/uL (ref 4.0–10.5)

## 2018-06-22 LAB — URINALYSIS, ROUTINE W REFLEX MICROSCOPIC
Bilirubin Urine: NEGATIVE
Hgb urine dipstick: NEGATIVE
Ketones, ur: NEGATIVE
Leukocytes, UA: NEGATIVE
Nitrite: NEGATIVE
RBC / HPF: NONE SEEN (ref 0–?)
Specific Gravity, Urine: 1.02 (ref 1.000–1.030)
Total Protein, Urine: NEGATIVE
Urine Glucose: NEGATIVE
Urobilinogen, UA: 0.2 (ref 0.0–1.0)
WBC, UA: NONE SEEN (ref 0–?)
pH: 6 (ref 5.0–8.0)

## 2018-06-22 LAB — BASIC METABOLIC PANEL
BUN: 15 mg/dL (ref 6–23)
CO2: 29 mEq/L (ref 19–32)
Calcium: 9.2 mg/dL (ref 8.4–10.5)
Chloride: 103 mEq/L (ref 96–112)
Creatinine, Ser: 0.98 mg/dL (ref 0.40–1.50)
GFR: 88.09 mL/min (ref >60.00)
Glucose, Bld: 130 mg/dL — ABNORMAL HIGH (ref 70–99)
Potassium: 4.2 mEq/L (ref 3.5–5.1)
Sodium: 139 mEq/L (ref 135–145)

## 2018-06-22 LAB — LIPID PANEL
Cholesterol: 138 mg/dL (ref 0–200)
HDL: 30.3 mg/dL — ABNORMAL LOW (ref >39.00)
LDL Cholesterol: 82 mg/dL (ref 0–99)
NonHDL: 107.53
Total CHOL/HDL Ratio: 5
Triglycerides: 129 mg/dL (ref 0.0–149.0)
VLDL: 25.8 mg/dL (ref 0.0–40.0)

## 2018-06-22 LAB — MICROALBUMIN / CREATININE URINE RATIO
Creatinine,U: 200.7 mg/dL
Microalb Creat Ratio: 0.3 mg/g (ref 0.0–30.0)
Microalb, Ur: <0.7 mg/dL (ref 0.0–1.9)

## 2018-06-22 LAB — PSA: PSA: 0.71 ng/mL (ref 0.10–4.00)

## 2018-06-22 LAB — HEMOGLOBIN A1C: Hgb A1c MFr Bld: 7.6 % — ABNORMAL HIGH (ref 4.6–6.5)

## 2018-06-22 LAB — HEPATIC FUNCTION PANEL
ALT: 25 U/L (ref 0–53)
AST: 17 U/L (ref 0–37)
Albumin: 4.2 g/dL (ref 3.5–5.2)
Alkaline Phosphatase: 77 U/L (ref 39–117)
Bilirubin, Direct: 0.1 mg/dL (ref 0.0–0.3)
Total Bilirubin: 0.4 mg/dL (ref 0.2–1.2)
Total Protein: 7.2 g/dL (ref 6.0–8.3)

## 2018-06-22 LAB — TSH: TSH: 2.58 u[IU]/mL (ref 0.35–4.50)

## 2018-06-22 MED ORDER — METFORMIN HCL ER 500 MG PO TB24: 1000.0000 mg | ORAL_TABLET | Freq: Every day | ORAL | 3 refills | Status: DC

## 2018-06-22 NOTE — Assessment & Plan Note (Signed)
Mild recurring, just not as severe as 2010, but also more freq and severe so will add to ortho referral as well

## 2018-06-22 NOTE — Assessment & Plan Note (Addendum)
With reduced ROM, suspect significant joint abnormality, for referral ortho for further evaluation  In addition to the time spent performing CPE, I spent an additional 25 minutes face to face,in which greater than 50% of this time was spent in counseling and coordination of care for patient's acute illness as documented, including the differential dx, treatment, further evaluation and other management of left elbow pain, bilateral plantar fasciitis, DM and HTN

## 2018-06-22 NOTE — Assessment & Plan Note (Signed)

## 2018-06-22 NOTE — Progress Notes (Signed)
Subjective:    Patient ID: Tyler [name], male    DOB: 19-Dec-1975, 43 y.o.   MRN: [id number]  HPI  Here for wellness and f/u;  Overall doing ok;  Pt denies Chest pain, worsening SOB, DOE, wheezing, orthopnea, PND, worsening LE edema, palpitations, dizziness or syncope.  Pt denies neurological change such as new headache, facial or extremity weakness.  Pt denies polydipsia, polyuria, or low sugar symptoms. Pt states overall good compliance with treatment and medications, good tolerability, and has been trying to follow appropriate diet.  Pt denies worsening depressive symptoms, suicidal ideation or panic. No fever, night sweats, wt loss, loss of appetite, or other constitutional symptoms.  Pt states good ability with ADL's, has low fall risk, home safety reviewed and adequate, no other significant changes in hearing or vision, and only occasionally active with exercise. C/o recurring bilat heel spur pain; never started the metformin for sugar.  Due for optho f/u.  Also c/o left elbow pain posteriorly primarily, but also with marked reduced ROM with extension less by at least 20 degrees.  Starting to become a real problem at his job as city [employer].  Mild to mod, constant, sharp, nothing else seems to make better or worse, no prior hx of same.  Has [facility] about 2010 for bilat plantar fasciits and also has some recurring heel pain as well, intermittent, mild to mod, worse to walk, better to sit. Past Medical History:  Diagnosis Date  . ACHILLES TENDINITIS, BILATERAL 10/16/2008  . ALLERGIC RHINITIS 08/13/2009  . CARDIOMYOPATHY 04/21/2009  . CHF (congestive heart failure) (HCC)   . CONGESTIVE HEART FAILURE 12/04/2007  . CORONARY ARTERY DISEASE 12/04/2007  . GERD (gastroesophageal reflux disease) 11/01/2011  . HYPERLIPIDEMIA 03/12/2007  . Hypertension   . HYPERTENSION 03/12/2007  . OBESITY 04/21/2009  . Plantar fasciitis, bilateral 11/01/2011  . SLEEP APNEA 03/12/2007   Past Surgical  History:  Procedure Laterality Date  . CARDIAC CATHETERIZATION N/A 12/19/2014   Procedure: Left Heart Cath and Coronary Angiography;  Surgeon: Lyn Records, MD;  Location: [facility] INVASIVE CV LAB;  Service: Cardiovascular;  Laterality: N/A;  . NO PAST SURGERIES      reports that he has never smoked. He has never used smokeless tobacco. He reports that he does not drink alcohol or use drugs. family history includes Asthma in his brother; Heart attack in his father and mother; Heart disease in his father and mother; Hypertension in an other family member. No Known Allergies Current Outpatient Medications on File Prior to Visit  Medication Sig Dispense Refill  . albuterol (PROVENTIL HFA;VENTOLIN HFA) 108 (90 BASE) MCG/ACT inhaler Inhale 2 puffs into the lungs every 6 (six) hours as needed for wheezing. 1 Inhaler 11  . amLODipine (NORVASC) 10 MG tablet TAKE 1 TABLET BY MOUTH ONCE DAILY 90 tablet 3  . aspirin EC 81 MG tablet Take 81 mg by mouth daily.    Marland Kitchen atorvastatin (LIPITOR) 80 MG tablet TAKE 1 TABLET BY MOUTH ONCE DAILY 30 tablet 5  . carvedilol (COREG) 25 MG tablet Take 2 tablets (50 mg total) by mouth 2 (two) times daily. 360 tablet 1  . cetirizine (ZYRTEC) 10 MG tablet Take 10 mg by mouth daily.    . furosemide (LASIX) 20 MG tablet Take 1 tablet (20 mg total) by mouth daily. 90 tablet 2  . hydrALAZINE (APRESOLINE) 25 MG tablet TAKE 1 TABLET BY MOUTH THREE TIMES DAILY 270 tablet 3  . isosorbide mononitrate (IMDUR)  30 MG 24 hr tablet TAKE 1 TABLET BY MOUTH ONCE DAILY 90 tablet 3  . lidocaine (LIDODERM) 5 % Place 1 patch onto the skin daily. Remove & Discard patch within 12 hours or as directed by MD 60 patch 1  . losartan (COZAAR) 50 MG tablet Take 50 mg by mouth daily.  3  . losartan (COZAAR) 50 MG tablet TAKE 1 TABLET BY MOUTH ONCE DAILY 30 tablet 11  . meclizine (ANTIVERT) 25 MG tablet Take 1 tablet (25 mg total) by mouth 3 (three) times daily as needed for dizziness. 30 tablet 0  . Multiple  Vitamins-Minerals (MULTIVITAMIN WITH MINERALS) tablet Take 1 tablet by mouth daily.      . nitroGLYCERIN (NITROSTAT) 0.4 MG SL tablet Place 1 tablet (0.4 mg total) under the tongue every 5 (five) minutes as needed for chest pain. 25 tablet 3  . Omega-3 Fatty Acids (FISH OIL) 1200 MG CAPS Take 1,200 mg by mouth daily.    . ondansetron (ZOFRAN) 4 MG tablet Take 1 tablet (4 mg total) by mouth every 8 (eight) hours as needed for nausea or vomiting. 6 tablet 0  . pantoprazole (PROTONIX) 40 MG tablet TAKE ONE TABLET BY MOUTH ONCE DAILY 90 tablet 0  . PRESCRIPTION MEDICATION Inhale into the lungs at bedtime. CPAP    . spironolactone (ALDACTONE) 25 MG tablet TAKE 1 TABLET BY MOUTH ONCE DAILY 90 tablet 3  . valsartan (DIOVAN) 160 MG tablet TAKE ONE TABLET BY MOUTH TWICE DAILY 180 tablet 3   No current facility-administered medications on file prior to visit.    Review of Systems Constitutional: Negative for other unusual diaphoresis, sweats, appetite or weight changes HENT: Negative for other worsening hearing loss, ear pain, facial swelling, mouth sores or neck stiffness.   Eyes: Negative for other worsening pain, redness or other visual disturbance.  Respiratory: Negative for other stridor or swelling Cardiovascular: Negative for other palpitations or other chest pain  Gastrointestinal: Negative for worsening diarrhea or loose stools, blood in stool, distention or other pain Genitourinary: Negative for hematuria, flank pain or other change in urine volume.  Musculoskeletal: Negative for myalgias or other joint swelling.  Skin: Negative for other color change, or other wound or worsening drainage.  Neurological: Negative for other syncope or numbness. Hematological: Negative for other adenopathy or swelling Psychiatric/Behavioral: Negative for hallucinations, other worsening agitation, SI, self-injury, or new decreased concentration All other system neg per pt    Objective:   Physical Exam BP  130/80 (BP Location: Left Arm, Patient Position: Sitting, Cuff Size: Large)   Pulse 69   Ht 5\' 9"  (1.753 m)   Wt (!) 408 lb (185.1 kg)   SpO2 95%   BMI 60.25 kg/m  VS noted,  Constitutional: Pt is oriented to person, place, and time. Appears well-developed and well-nourished, in no significant distress and comfortable Head: Normocephalic and atraumatic  Eyes: Conjunctivae and EOM are normal. Pupils are equal, round, and reactive to light Right Ear: External ear normal without discharge Left Ear: External ear normal without discharge Nose: Nose without discharge or deformity Mouth/Throat: Oropharynx is without other ulcerations and moist  Neck: Normal range of motion. Neck supple. No JVD present. No tracheal deviation present or significant neck LA or mass Cardiovascular: Normal rate, regular rhythm, normal heart sounds and intact distal pulses.   Pulmonary/Chest: WOB normal and breath sounds without rales or wheezing  Abdominal: Soft. Bowel sounds are normal. NT. No HSM  Musculoskeletal: Normal range of motion. Exhibits no edema  except for reduced ROM to left elbow by approx 20 degrees with flexion Lymphadenopathy: Has no other cervical adenopathy.  Neurological: Pt is alert and oriented to person, place, and time. Pt has normal reflexes. No cranial nerve deficit. Motor grossly intact, Gait intact Skin: Skin is warm and dry. No rash noted or new ulcerations Psychiatric:  Has normal mood and affect. Behavior is normal without agitation No other exam findings Lab Results  Component Value Date   WBC 8.2 06/22/2018   HGB 14.0 06/22/2018   HCT 41.7 06/22/2018   PLT 220.0 06/22/2018   GLUCOSE 130 (H) 06/22/2018   CHOL 138 06/22/2018   TRIG 129.0 06/22/2018   HDL 30.30 (L) 06/22/2018   LDLDIRECT 183.6 08/02/2011   LDLCALC 82 06/22/2018   ALT 25 06/22/2018   AST 17 06/22/2018   NA 139 06/22/2018   K 4.2 06/22/2018   CL 103 06/22/2018   CREATININE 0.98 06/22/2018   BUN 15 06/22/2018    CO2 29 06/22/2018   TSH 2.58 06/22/2018   PSA 0.71 06/22/2018   INR 1.2 (H) 12/15/2014   HGBA1C 7.6 (H) 06/22/2018   MICROALBUR <0.7 06/22/2018          Assessment & Plan:

## 2018-06-22 NOTE — Patient Instructions (Signed)
Please continue all other medications as before, and refills have been done if requested.  Please have the pharmacy call with any other refills you may need.  Please continue your efforts at being more active, low cholesterol diet, and weight control.  You are otherwise up to date with prevention measures today.  Please keep your appointments with your specialists as you may have planned  You will be contacted regarding the referral for: Orthopedic, and Eye Doctor  Please go to the LAB in the Basement (turn left off the elevator) for the tests to be done today  You will be contacted by phone if any changes need to be made immediately.  Otherwise, you will receive a letter about your results with an explanation, but please check with MyChart first.  Please remember to sign up for MyChart if you have not done so, as this will be important to you in the future with finding out test results, communicating by private email, and scheduling acute appointments online when needed.  Please return in 6 months, or sooner if needed, with Lab testing done 3-5 days before

## 2018-06-22 NOTE — Assessment & Plan Note (Signed)
stable overall by history and exam, recent data reviewed with pt, and pt to continue medical treatment as before,  to f/u any worsening symptoms or concerns  

## 2018-06-22 NOTE — Assessment & Plan Note (Signed)
stable overall by history and exam, recent data reviewed with pt, and pt to continue medical treatment as before,  to f/u any worsening symptoms or concerns, for f/u a1c with labs 

## 2018-07-05 ENCOUNTER — Other Ambulatory Visit: Payer: Self-pay | Admitting: Cardiology

## 2018-07-24 DIAGNOSIS — M25522 Pain in left elbow: Secondary | ICD-10-CM | POA: Diagnosis not present

## 2018-07-24 DIAGNOSIS — M9262 Juvenile osteochondrosis of tarsus, left ankle: Secondary | ICD-10-CM | POA: Diagnosis not present

## 2018-08-09 ENCOUNTER — Other Ambulatory Visit: Payer: Self-pay | Admitting: Cardiology

## 2018-08-09 DIAGNOSIS — E785 Hyperlipidemia, unspecified: Secondary | ICD-10-CM

## 2018-08-28 ENCOUNTER — Ambulatory Visit: Payer: Federal, State, Local not specified - PPO | Admitting: Internal Medicine

## 2018-08-28 ENCOUNTER — Encounter: Payer: Self-pay | Admitting: Internal Medicine

## 2018-08-28 VITALS — BP 130/80 | HR 69 | Temp 97.5°F | Ht 69.0 in | Wt >= 6400 oz

## 2018-08-28 DIAGNOSIS — Z23 Encounter for immunization: Secondary | ICD-10-CM

## 2018-08-28 DIAGNOSIS — M79605 Pain in left leg: Secondary | ICD-10-CM | POA: Diagnosis not present

## 2018-08-28 DIAGNOSIS — I10 Essential (primary) hypertension: Secondary | ICD-10-CM | POA: Diagnosis not present

## 2018-08-28 DIAGNOSIS — E119 Type 2 diabetes mellitus without complications: Secondary | ICD-10-CM | POA: Diagnosis not present

## 2018-08-28 DIAGNOSIS — R2242 Localized swelling, mass and lump, left lower limb: Secondary | ICD-10-CM | POA: Insufficient documentation

## 2018-08-28 NOTE — Progress Notes (Signed)
Subjective:    Patient ID: Tyler [name], male    DOB: 12-25-1975, 43 y.o.   MRN: [id number]  HPI  Here to f/u with c/o just noticed large swelling area without pain to the proximal medial left thigh just distal to the inguinal ligament; may have had it for some time? But he and wife just noticed last PM.  No pain, redness, fever or trauma such as falls or other.  Pt denies chest pain, increased sob or doe, wheezing, orthopnea, PND, increased LE swelling, palpitations, dizziness or syncope.  In fact has LLE always a bit larger than the right without change.  Pt denies new neurological symptoms such as new headache, or facial or extremity weakness or numbness   Pt denies polydipsia, polyuria, BP Readings from Last 3 Encounters:  08/28/18 130/80  06/22/18 130/80  01/15/18 126/88   Past Medical History:  Diagnosis Date  . ACHILLES TENDINITIS, BILATERAL 10/16/2008  . ALLERGIC RHINITIS 08/13/2009  . CARDIOMYOPATHY 04/21/2009  . CHF (congestive heart failure) (HCC)   . CONGESTIVE HEART FAILURE 12/04/2007  . CORONARY ARTERY DISEASE 12/04/2007  . GERD (gastroesophageal reflux disease) 11/01/2011  . HYPERLIPIDEMIA 03/12/2007  . Hypertension   . HYPERTENSION 03/12/2007  . OBESITY 04/21/2009  . Plantar fasciitis, bilateral 11/01/2011  . SLEEP APNEA 03/12/2007   Past Surgical History:  Procedure Laterality Date  . CARDIAC CATHETERIZATION N/A 12/19/2014   Procedure: Left Heart Cath and Coronary Angiography;  Surgeon: Lyn Records, MD;  Location: [facility] INVASIVE CV LAB;  Service: Cardiovascular;  Laterality: N/A;  . NO PAST SURGERIES      reports that he has never smoked. He has never used smokeless tobacco. He reports that he does not drink alcohol or use drugs. family history includes Asthma in his brother; Heart attack in his father and mother; Heart disease in his father and mother; Hypertension in an other family member. No Known Allergies Current Outpatient Medications on File Prior to Visit    Medication Sig Dispense Refill  . albuterol (PROVENTIL HFA;VENTOLIN HFA) 108 (90 BASE) MCG/ACT inhaler Inhale 2 puffs into the lungs every 6 (six) hours as needed for wheezing. 1 Inhaler 11  . amLODipine (NORVASC) 10 MG tablet TAKE 1 TABLET BY MOUTH ONCE DAILY 90 tablet 3  . aspirin EC 81 MG tablet Take 81 mg by mouth daily.    Marland Kitchen atorvastatin (LIPITOR) 80 MG tablet TAKE 1 TABLET BY MOUTH ONCE DAILY 30 tablet 0  . carvedilol (COREG) 25 MG tablet Take 2 tablets (50 mg total) by mouth 2 (two) times daily. SCHEDULE OV FOR FURTHER REFILLS. LAST ATTEMPT 120 tablet 0  . cetirizine (ZYRTEC) 10 MG tablet Take 10 mg by mouth daily.    . furosemide (LASIX) 20 MG tablet Take 1 tablet (20 mg total) by mouth daily. 90 tablet 2  . hydrALAZINE (APRESOLINE) 25 MG tablet TAKE 1 TABLET BY MOUTH THREE TIMES DAILY 270 tablet 3  . isosorbide mononitrate (IMDUR) 30 MG 24 hr tablet TAKE 1 TABLET BY MOUTH ONCE DAILY 90 tablet 3  . lidocaine (LIDODERM) 5 % Place 1 patch onto the skin daily. Remove & Discard patch within 12 hours or as directed by MD 60 patch 1  . losartan (COZAAR) 50 MG tablet TAKE 1 TABLET BY MOUTH ONCE DAILY 30 tablet 11  . losartan (COZAAR) 50 MG tablet TAKE 1 TABLET BY MOUTH ONCE DAILY 30 tablet 0  . meclizine (ANTIVERT) 25 MG tablet Take 1 tablet (25 mg total) by mouth  3 (three) times daily as needed for dizziness. 30 tablet 0  . metFORMIN (GLUCOPHAGE-XR) 500 MG 24 hr tablet Take 2 tablets (1,000 mg total) by mouth daily with breakfast. 180 tablet 3  . Multiple Vitamins-Minerals (MULTIVITAMIN WITH MINERALS) tablet Take 1 tablet by mouth daily.      . nitroGLYCERIN (NITROSTAT) 0.4 MG SL tablet Place 1 tablet (0.4 mg total) under the tongue every 5 (five) minutes as needed for chest pain. 25 tablet 3  . Omega-3 Fatty Acids (FISH OIL) 1200 MG CAPS Take 1,200 mg by mouth daily.    . ondansetron (ZOFRAN) 4 MG tablet Take 1 tablet (4 mg total) by mouth every 8 (eight) hours as needed for nausea or  vomiting. 6 tablet 0  . pantoprazole (PROTONIX) 40 MG tablet TAKE ONE TABLET BY MOUTH ONCE DAILY 90 tablet 0  . PRESCRIPTION MEDICATION Inhale into the lungs at bedtime. CPAP    . spironolactone (ALDACTONE) 25 MG tablet TAKE 1 TABLET BY MOUTH ONCE DAILY 90 tablet 3  . valsartan (DIOVAN) 160 MG tablet TAKE ONE TABLET BY MOUTH TWICE DAILY 180 tablet 3   No current facility-administered medications on file prior to visit.    Review of Systems  Constitutional: Negative for other unusual diaphoresis or sweats HENT: Negative for ear discharge or swelling Eyes: Negative for other worsening visual disturbances Respiratory: Negative for stridor or other swelling  Gastrointestinal: Negative for worsening distension or other blood Genitourinary: Negative for retention or other urinary change Musculoskeletal: Negative for other MSK pain or swelling Skin: Negative for color change or other new lesions Neurological: Negative for worsening tremors and other numbness  Psychiatric/Behavioral: Negative for worsening agitation or other fatigue All other system neg per pt    Objective:   Physical Exam BP 130/80 (BP Location: Left Arm, Patient Position: Sitting, Cuff Size: Large)   Pulse 69   Temp (!) 97.5 F (36.4 C) (Oral)   Ht 5\' 9"  (1.753 m)   Wt (!) 404 lb 12 oz (183.6 kg)   SpO2 97%   BMI 59.77 kg/m  VS noted,  Constitutional: Pt appears in NAD HENT: Head: NCAT.  Right Ear: External ear normal.  Left Ear: External ear normal.  Eyes: . Pupils are equal, round, and reactive to light. Conjunctivae and EOM are normal Nose: without d/c or deformity Neck: Neck supple. Gross normal ROM Cardiovascular: Normal rate and regular rhythm.   Pulmonary/Chest: Effort normal and breath sounds without rales or wheezing.  Abd:  Soft, NT, ND, + BS, no organomegaly Left proximal medial thigh with large nontender swelling ? Fatty tissue but cant r/o hernia or other Neurological: Pt is alert. At baseline  orientation, motor grossly intact Skin: Skin is warm. No rashes, other new lesions, no LE edema Psychiatric: Pt behavior is normal without agitation  No other exam findings Lab Results  Component Value Date   WBC 8.2 06/22/2018   HGB 14.0 06/22/2018   HCT 41.7 06/22/2018   PLT 220.0 06/22/2018   GLUCOSE 130 (H) 06/22/2018   CHOL 138 06/22/2018   TRIG 129.0 06/22/2018   HDL 30.30 (L) 06/22/2018   LDLDIRECT 183.6 08/02/2011   LDLCALC 82 06/22/2018   ALT 25 06/22/2018   AST 17 06/22/2018   NA 139 06/22/2018   K 4.2 06/22/2018   CL 103 06/22/2018   CREATININE 0.98 06/22/2018   BUN 15 06/22/2018   CO2 29 06/22/2018   TSH 2.58 06/22/2018   PSA 0.71 06/22/2018   INR 1.2 (H) 12/15/2014  HGBA1C 7.6 (H) 06/22/2018   MICROALBUR <0.7 06/22/2018        Assessment & Plan:

## 2018-08-28 NOTE — Assessment & Plan Note (Signed)
stable overall by history and exam, recent data reviewed with pt, and pt to continue medical treatment as before,  to f/u any worsening symptoms or concerns  

## 2018-08-28 NOTE — Assessment & Plan Note (Signed)
?   Fatty tissue or lipoma vs hernia vs other - for ct femur

## 2018-08-28 NOTE — Patient Instructions (Addendum)
You had the Tdap tetanus shot today  Please continue all other medications as before, and refills have been done if requested.  Please have the pharmacy call with any other refills you may need.  Please continue your efforts at being more active, low cholesterol diet, and weight control.  Please keep your appointments with your specialists as you may have planned  You will be contacted regarding the referral for: CT scan for the left upper leg

## 2018-09-06 ENCOUNTER — Other Ambulatory Visit: Payer: Self-pay | Admitting: Internal Medicine

## 2018-09-06 ENCOUNTER — Other Ambulatory Visit: Payer: Self-pay

## 2018-09-06 ENCOUNTER — Ambulatory Visit (INDEPENDENT_AMBULATORY_CARE_PROVIDER_SITE_OTHER)
Admission: RE | Admit: 2018-09-06 | Discharge: 2018-09-06 | Disposition: A | Discharge status: Home / Self Care | Payer: Federal, State, Local not specified - PPO | Source: Ambulatory Visit | Attending: Internal Medicine | Admitting: Internal Medicine

## 2018-09-06 DIAGNOSIS — M79605 Pain in left leg: Secondary | ICD-10-CM

## 2018-09-06 MED ORDER — DOXYCYCLINE HYCLATE 100 MG PO TABS: 100.0000 mg | ORAL_TABLET | Freq: Two times a day (BID) | ORAL | 0 refills | Status: DC

## 2018-09-16 ENCOUNTER — Other Ambulatory Visit: Payer: Self-pay | Admitting: Cardiology

## 2018-09-16 DIAGNOSIS — E785 Hyperlipidemia, unspecified: Secondary | ICD-10-CM

## 2018-09-20 ENCOUNTER — Telehealth: Payer: Self-pay

## 2018-09-20 ENCOUNTER — Telehealth: Payer: Self-pay | Admitting: Cardiology

## 2018-09-20 NOTE — Telephone Encounter (Signed)
Spoke with pt, Aware of dr crenshaw's recommendations.  °

## 2018-09-20 NOTE — Telephone Encounter (Signed)
Copied from CRM [id number]. Topic: General - Other >> Sep 20, 2018 11:01 AM Herby Abraham C wrote: Reason for CRM: pt called in and is requesting a work letter excusing him from work due to his current health concerns. 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     [phone] - pt would like to have letter mailed or pt will come in to pick up

## 2018-09-20 NOTE — Telephone Encounter (Signed)
We have been asked not to write letters; tell him we are sorry Tyler [name]

## 2018-09-20 NOTE — Telephone Encounter (Signed)
New Message   Patient states he needs a work note since he's a high risk patient and would like a nurse to call him back.

## 2018-09-20 NOTE — Telephone Encounter (Signed)
Called patient, he states that he needs a letter to be sent writing him out of work for the next 2 weeks. 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 I advised that they were not writing letters at this time, but I would send a message to the doctor and his nurse to make them aware and to see what the doctor recommended for him and if letter could be done.   Patient verbalized understanding

## 2018-09-21 NOTE — Telephone Encounter (Signed)
Letter has been mailed.

## 2018-09-21 NOTE — Telephone Encounter (Signed)
Ok for letter - done hardcopy to sihrron

## 2018-10-29 ENCOUNTER — Telehealth: Payer: Self-pay | Admitting: Internal Medicine

## 2018-10-29 MED ORDER — METFORMIN HCL ER 500 MG PO TB24: 1000.0000 mg | ORAL_TABLET | Freq: Every day | ORAL | 0 refills | Status: DC

## 2018-10-29 NOTE — Telephone Encounter (Signed)
Copied from CRM [id number]. Topic: Quick Communication - Rx Refill/Question >> Oct 29, 2018 11:27 AM Zada Girt, Lumin L wrote: Medication: metFORMIN (GLUCOPHAGE-XR) 500 MG 24 hr tablet   Has the patient contacted their pharmacy? {yes  (Agent: If no, request that the patient contact the pharmacy for the refill.) (Agent: If yes, when and what did the pharmacy advise?)  Preferred Pharmacy (with phone number or street name): [facility] - [place], [place] - [address] [place] [address] [place] [address] Phone: [phone] Fax: [phone]  Agent: Please be advised that RX refills may take up to 3 business days. We ask that you follow-up with your pharmacy.

## 2018-10-29 NOTE — Telephone Encounter (Signed)
Reviewed chart pt is up-to-date sent refills to [facility].../lmb  

## 2018-11-01 ENCOUNTER — Other Ambulatory Visit: Payer: Self-pay | Admitting: Cardiology

## 2018-11-01 DIAGNOSIS — E785 Hyperlipidemia, unspecified: Secondary | ICD-10-CM

## 2018-11-06 ENCOUNTER — Other Ambulatory Visit: Payer: Self-pay | Admitting: Cardiology

## 2018-11-06 ENCOUNTER — Telehealth: Payer: Self-pay | Admitting: Cardiology

## 2018-11-06 DIAGNOSIS — I429 Cardiomyopathy, unspecified: Secondary | ICD-10-CM

## 2018-11-07 NOTE — Progress Notes (Signed)
Virtual Visit via Video Note   This visit type was conducted due to national recommendations for restrictions regarding the COVID-19 Pandemic (e.g. social distancing) in an effort to limit this patient's exposure and mitigate transmission in our community.  Due to his co-morbid illnesses, this patient is at least at moderate risk for complications without adequate follow up.  This format is felt to be most appropriate for this patient at this time.  All issues noted in this document were discussed and addressed.  A limited physical exam was performed with this format.  Please refer to the patient's chart for his consent to telehealth for [facility]   Date:  11/08/2018   ID:  Tyler [name], DOB Feb 15, 1976, MRN [id number]  Patient Location: Home Provider Location: Home  PCP:  [name], MD  Cardiologist:  Dr Jens Som  Evaluation Performed:  Follow-Up Visit  Chief Complaint:  FU NICM and hypertension  History of Present Illness:    FU hypertension and nonischemic cardiomyopathy. Patient had a previous nonischemic cardiomyopathy which improved on fu echos. However he was seen for recurrent chest pain. Nuclear study June 2016 showed an ejection fraction of 37%. There is an old inferior/inferior lateral infarct with peri-infarct ischemia. Cardiac catheterization July 2016 showed a 90% first posterior lateral branch, 90% distal LAD and 50% proximal RCA. Medical therapy recommended. Ejection fraction 25-35%. Last echocardiogram January 2019 showed ejection fraction 45 to 50%, mild left ventricular hypertrophy, mild left atrial enlargement.  Since last seen,the patient has dyspnea with more extreme activities but not with routine activities. It is relieved with rest. It is not associated with chest pain. There is no orthopnea, PND or pedal edema. There is no syncope or palpitations. There is no exertional chest pain.  The patient does not have symptoms concerning for COVID-19 infection  (fever, chills, cough, or new shortness of breath).    Past Medical History:  Diagnosis Date  . ACHILLES TENDINITIS, BILATERAL 10/16/2008  . ALLERGIC RHINITIS 08/13/2009  . CARDIOMYOPATHY 04/21/2009  . CHF (congestive heart failure) (HCC)   . CONGESTIVE HEART FAILURE 12/04/2007  . CORONARY ARTERY DISEASE 12/04/2007  . GERD (gastroesophageal reflux disease) 11/01/2011  . HYPERLIPIDEMIA 03/12/2007  . Hypertension   . HYPERTENSION 03/12/2007  . OBESITY 04/21/2009  . Plantar fasciitis, bilateral 11/01/2011  . SLEEP APNEA 03/12/2007   Past Surgical History:  Procedure Laterality Date  . CARDIAC CATHETERIZATION N/A 12/19/2014   Procedure: Left Heart Cath and Coronary Angiography;  Surgeon: Lyn Records, MD;  Location: [facility] INVASIVE CV LAB;  Service: Cardiovascular;  Laterality: N/A;  . NO PAST SURGERIES       Current Meds  Medication Sig  . albuterol (PROVENTIL HFA;VENTOLIN HFA) 108 (90 BASE) MCG/ACT inhaler Inhale 2 puffs into the lungs every 6 (six) hours as needed for wheezing.  Marland Kitchen amLODipine (NORVASC) 10 MG tablet TAKE 1 TABLET BY MOUTH ONCE DAILY  . aspirin EC 81 MG tablet Take 81 mg by mouth daily.  Marland Kitchen atorvastatin (LIPITOR) 80 MG tablet TAKE 1 TABLET BY MOUTH ONCE DAILY. APPT REQUIRED FOR FUTHER REFILLS  . carvedilol (COREG) 25 MG tablet Take 2 tablets (50 mg total) by mouth 2 (two) times daily. SCHEDULE OV FOR FURTHER REFILLS. LAST ATTEMPT (Patient taking differently: Take 50 mg by mouth 2 (two) times daily. SCHEDULE OV FOR FURTHER REFILLS. LAST ATTEMPT)  . cetirizine (ZYRTEC) 10 MG tablet Take 10 mg by mouth daily.  Marland Kitchen doxycycline (VIBRA-TABS) 100 MG tablet Take 1 tablet (100 mg  total) by mouth 2 (two) times daily. (Patient taking differently: Take 100 mg by mouth daily. )  . furosemide (LASIX) 20 MG tablet Take 1 tablet (20 mg total) by mouth daily. SCHEDULE OV FOR FURTHER REFILLS. FINAL ATEMPT.  . hydrALAZINE (APRESOLINE) 25 MG tablet TAKE 1 TABLET BY MOUTH THREE TIMES DAILY (Patient taking  differently: Take 25 mg by mouth daily. )  . isosorbide mononitrate (IMDUR) 30 MG 24 hr tablet TAKE 1 TABLET BY MOUTH ONCE DAILY  . lidocaine (LIDODERM) 5 % Place 1 patch onto the skin daily. Remove & Discard patch within 12 hours or as directed by MD  . losartan (COZAAR) 50 MG tablet TAKE 1 TABLET BY MOUTH ONCE DAILY  . metFORMIN (GLUCOPHAGE-XR) 500 MG 24 hr tablet Take 2 tablets (1,000 mg total) by mouth daily with breakfast. Follow-up appt due in July must see provider for future refills (Patient taking differently: Take 500 mg by mouth daily with breakfast. Follow-up appt due in July must see provider for future refills)  . Multiple Vitamins-Minerals (MULTIVITAMIN WITH MINERALS) tablet Take 1 tablet by mouth daily.    Marland Kitchen PRESCRIPTION MEDICATION Inhale into the lungs at bedtime. CPAP  . spironolactone (ALDACTONE) 25 MG tablet TAKE 1 TABLET BY MOUTH ONCE DAILY     Allergies:   Patient has no known allergies.   Social History   Tobacco Use  . Smoking status: Never Smoker  . Smokeless tobacco: Never Used  Substance Use Topics  . Alcohol use: No  . Drug use: No     Family Hx: The patient's family history includes Asthma in his brother; Heart attack in his father and mother; Heart disease in his father and mother; Hypertension in an other family member.  ROS:   Please see the history of present illness.    No fevers, chills or productive cough All other systems reviewed and are negative.  Recent Labs: 06/22/2018: ALT 25; BUN 15; Creatinine, Ser 0.98; Hemoglobin 14.0; Platelets 220.0; Potassium 4.2; Sodium 139; TSH 2.58   Recent Lipid Panel Lab Results  Component Value Date/Time   CHOL 138 06/22/2018 04:01 PM   TRIG 129.0 06/22/2018 04:01 PM   HDL 30.30 (L) 06/22/2018 04:01 PM   CHOLHDL 5 06/22/2018 04:01 PM   LDLCALC 82 06/22/2018 04:01 PM   LDLDIRECT 183.6 08/02/2011 04:42 PM    Wt Readings from Last 3 Encounters:  11/08/18 (!) 400 lb (181.4 kg)  08/28/18 (!) 404 lb 12 oz  (183.6 kg)  06/22/18 (!) 408 lb (185.1 kg)     Objective:    Vital Signs:  Ht 5\' 5"  (1.651 m)   Wt (!) 400 lb (181.4 kg)   BMI 66.56 kg/m    VITAL SIGNS:  reviewed  No acute distress Answers questions appropriately Normal affect Remainder of physical examination not performed (telehealth visit; coronavirus pandemic)  ASSESSMENT & PLAN:    1. NICM-LV function improved on most recent echocardiogram.  Continue beta-blocker.  Increase losartan to 100 mg daily.  Check potassium and renal function in 1 week.  Discontinue hydralazine and isosorbide. Can resume if blood pressure increases. 2. Hypertension-patient's blood pressure is controlled.  Will consolidate meds as outlined. Follow BP and adjust as needed. 3. Hyperlipidemia-continue statin. 4. Coronary artery disease-continue aspirin and statin.  No chest pain. 5. Chronic combined systolic/diastolic congestive heart failure-patient appears to be euvolemic based on history.  Continue fluid restriction and low-sodium diet.  Continue present dose of Lasix.  Check potassium and renal function. 6. Morbid obesity-we  discussed the importance of diet, exercise and weight loss. Refer to dietician.  COVID-19 Education: The importance of social distancing was discussed today.  Time:   Today, I have spent 20 minutes with the patient with telehealth technology discussing the above problems.     Medication Adjustments/Labs and Tests Ordered: Current medicines are reviewed at length with the patient today.  Concerns regarding medicines are outlined above.   Tests Ordered: No orders of the defined types were placed in this encounter.   Medication Changes: No orders of the defined types were placed in this encounter.   Disposition:  Follow up in 6 month(s)  Signed, Olga Millers, MD  11/08/2018 3:01 PM    [facility]

## 2018-11-08 ENCOUNTER — Telehealth (INDEPENDENT_AMBULATORY_CARE_PROVIDER_SITE_OTHER): Payer: Federal, State, Local not specified - PPO | Admitting: Cardiology

## 2018-11-08 ENCOUNTER — Encounter: Payer: Self-pay | Admitting: Cardiology

## 2018-11-08 VITALS — Ht 65.0 in | Wt >= 6400 oz

## 2018-11-08 DIAGNOSIS — E7800 Pure hypercholesterolemia, unspecified: Secondary | ICD-10-CM

## 2018-11-08 DIAGNOSIS — I10 Essential (primary) hypertension: Secondary | ICD-10-CM | POA: Diagnosis not present

## 2018-11-08 DIAGNOSIS — I2510 Atherosclerotic heart disease of native coronary artery without angina pectoris: Secondary | ICD-10-CM

## 2018-11-08 MED ORDER — LOSARTAN POTASSIUM 100 MG PO TABS: 100.0000 mg | ORAL_TABLET | Freq: Every day | ORAL | 3 refills | Status: DC

## 2018-11-08 NOTE — Patient Instructions (Signed)
Medication Instructions:  STOP ISOSORBIDE  STOP HYDRALAZINE  INCREASE LOSARTAN TO 100 MG ONCE DAILY= 2 OF THE 50 MG TABLETS ONCE DAILY If you need a refill on your cardiac medications before your next appointment, please call your pharmacy.   Lab work: Your physician recommends that you return for lab work in: ONE WEEK If you have labs (blood work) drawn today and your tests are completely normal, you will receive your results only by: Marland Kitchen MyChart Message (if you have MyChart) OR . A paper copy in the mail If you have any lab test that is abnormal or we need to change your treatment, we will call you to review the results.  Follow-Up: At [facility], you and your health needs are our priority.  As part of our continuing mission to provide you with exceptional heart care, we have created designated Provider Care Teams.  These Care Teams include your primary Cardiologist (physician) and Advanced Practice Providers (APPs -  Physician Assistants and Nurse Practitioners) who all work together to provide you with the care you need, when you need it. You will need a follow up appointment in 6 months.  Please call our office 2 months in advance to schedule this appointment.  You may see Olga Millers MD or one of the following Advanced Practice Providers on your designated Care Team:   Corine Shelter, PA-C Judy Pimple, [place] . Marjie Skiff, PA-C

## 2018-11-14 DIAGNOSIS — I10 Essential (primary) hypertension: Secondary | ICD-10-CM | POA: Diagnosis not present

## 2018-11-15 ENCOUNTER — Encounter: Payer: Self-pay | Admitting: *Deleted

## 2018-11-15 LAB — BASIC METABOLIC PANEL
BUN/Creatinine Ratio: 18 (ref 9–20)
BUN: 16 mg/dL (ref 6–24)
CO2: 20 mmol/L (ref 20–29)
Calcium: 9.1 mg/dL (ref 8.7–10.2)
Chloride: 105 mmol/L (ref 96–106)
Creatinine, Ser: 0.9 mg/dL (ref 0.76–1.27)
GFR calc Af Amer: 120 mL/min/{1.73_m2} (ref >59)
GFR calc non Af Amer: 104 mL/min/{1.73_m2} (ref >59)
Glucose: 87 mg/dL (ref 65–99)
Potassium: 4.5 mmol/L (ref 3.5–5.2)
Sodium: 141 mmol/L (ref 134–144)

## 2018-12-15 ENCOUNTER — Other Ambulatory Visit: Payer: Self-pay | Admitting: Cardiology

## 2018-12-15 DIAGNOSIS — E785 Hyperlipidemia, unspecified: Secondary | ICD-10-CM

## 2018-12-18 NOTE — Telephone Encounter (Signed)
Opened in error

## 2018-12-26 ENCOUNTER — Other Ambulatory Visit: Payer: Self-pay

## 2018-12-26 ENCOUNTER — Encounter: Payer: Self-pay | Admitting: Internal Medicine

## 2018-12-26 ENCOUNTER — Ambulatory Visit (INDEPENDENT_AMBULATORY_CARE_PROVIDER_SITE_OTHER): Payer: Federal, State, Local not specified - PPO | Admitting: Internal Medicine

## 2018-12-26 VITALS — BP 132/86 | HR 62 | Temp 98.4°F | Ht 65.0 in | Wt 392.0 lb

## 2018-12-26 DIAGNOSIS — I10 Essential (primary) hypertension: Secondary | ICD-10-CM

## 2018-12-26 DIAGNOSIS — R2242 Localized swelling, mass and lump, left lower limb: Secondary | ICD-10-CM

## 2018-12-26 DIAGNOSIS — E559 Vitamin D deficiency, unspecified: Secondary | ICD-10-CM | POA: Diagnosis not present

## 2018-12-26 DIAGNOSIS — E785 Hyperlipidemia, unspecified: Secondary | ICD-10-CM

## 2018-12-26 DIAGNOSIS — Z0000 Encounter for general adult medical examination without abnormal findings: Secondary | ICD-10-CM | POA: Diagnosis not present

## 2018-12-26 DIAGNOSIS — E119 Type 2 diabetes mellitus without complications: Secondary | ICD-10-CM

## 2018-12-26 DIAGNOSIS — E538 Deficiency of other specified B group vitamins: Secondary | ICD-10-CM

## 2018-12-26 DIAGNOSIS — E611 Iron deficiency: Secondary | ICD-10-CM

## 2018-12-26 LAB — POCT GLYCOSYLATED HEMOGLOBIN (HGB A1C): Hemoglobin A1C: 6.8 % — AB (ref 4.0–5.6)

## 2018-12-26 MED ORDER — METFORMIN HCL 500 MG PO TABS: 500.0000 mg | ORAL_TABLET | Freq: Every day | ORAL | 3 refills | Status: DC

## 2018-12-26 NOTE — Assessment & Plan Note (Signed)
stable overall by history and exam, recent data reviewed with pt, and pt to continue medical treatment as before,  to f/u any worsening symptoms or concerns  

## 2018-12-26 NOTE — Patient Instructions (Signed)
Your A1c is OK today  Please continue all other medications as before, and refills have been done if requested.  Please have the pharmacy call with any other refills you may need.  Please continue your efforts at being more active, low cholesterol diet, and weight control.  Please keep your appointments with your specialists as you may have planned  Please return in 6 months, or sooner if needed, with Lab testing done 3-5 days before  

## 2018-12-26 NOTE — Assessment & Plan Note (Signed)
Resolved post antibiotic, cont to follow

## 2018-12-26 NOTE — Progress Notes (Signed)
Subjective:    Patient ID: Tyler [name], male    DOB: 1976/01/14, 43 y.o.   MRN: [id number]  HPI Here to f/u; overall doing ok,  Pt denies chest pain, increasing sob or doe, wheezing, orthopnea, PND, increased LE swelling, palpitations, dizziness or syncope.  Pt denies new neurological symptoms such as new headache, or facial or extremity weakness or numbness.  Pt denies polydipsia, polyuria, or low sugar episode.  Pt states overall good compliance with meds, mostly trying to follow appropriate diet, with wt overall stable,  but little exercise however. Left leg improved after antibiotic.   Has been off the meformin for about 2 wks due to recall.  Past Medical History:  Diagnosis Date  . ACHILLES TENDINITIS, BILATERAL 10/16/2008  . ALLERGIC RHINITIS 08/13/2009  . CARDIOMYOPATHY 04/21/2009  . CHF (congestive heart failure) ([place])   . CONGESTIVE HEART FAILURE 12/04/2007  . CORONARY ARTERY DISEASE 12/04/2007  . GERD (gastroesophageal reflux disease) 11/01/2011  . HYPERLIPIDEMIA 03/12/2007  . Hypertension   . HYPERTENSION 03/12/2007  . OBESITY 04/21/2009  . Plantar fasciitis, bilateral 11/01/2011  . SLEEP APNEA 03/12/2007   Past Surgical History:  Procedure Laterality Date  . CARDIAC CATHETERIZATION N/A 12/19/2014   Procedure: Left Heart Cath and Coronary Angiography;  Surgeon: Belva Crome, MD;  Location: [facility];  Service: Cardiovascular;  Laterality: N/A;  . NO PAST SURGERIES      reports that he has never smoked. He has never used smokeless tobacco. He reports that he does not drink alcohol or use drugs. family history includes Asthma in his brother; Heart attack in his father and mother; Heart disease in his father and mother; Hypertension in an other family member. No Known Allergies Current Outpatient Medications on File Prior to Visit  Medication Sig Dispense Refill  . albuterol (PROVENTIL HFA;VENTOLIN HFA) 108 (90 BASE) MCG/ACT inhaler Inhale 2 puffs into the lungs every 6  (six) hours as needed for wheezing. 1 Inhaler 11  . amLODipine (NORVASC) 10 MG tablet TAKE 1 TABLET BY MOUTH ONCE DAILY 90 tablet 3  . aspirin EC 81 MG tablet Take 81 mg by mouth daily.    Marland Kitchen atorvastatin (LIPITOR) 80 MG tablet Take 1 tablet (80 mg total) by mouth daily. 30 tablet 5  . carvedilol (COREG) 25 MG tablet Take 2 tablets (50 mg total) by mouth 2 (two) times daily. SCHEDULE OV FOR FURTHER REFILLS. LAST ATTEMPT (Patient taking differently: Take 50 mg by mouth 2 (two) times daily. SCHEDULE OV FOR FURTHER REFILLS. LAST ATTEMPT) 120 tablet 0  . cetirizine (ZYRTEC) 10 MG tablet Take 10 mg by mouth daily.    . furosemide (LASIX) 20 MG tablet Take 1 tablet (20 mg total) by mouth daily. SCHEDULE OV FOR FURTHER REFILLS. FINAL ATEMPT. 90 tablet 0  . isosorbide mononitrate (IMDUR) 30 MG 24 hr tablet Take 30 mg by mouth daily.    Marland Kitchen lidocaine (LIDODERM) 5 % Place 1 patch onto the skin daily. Remove & Discard patch within 12 hours or as directed by MD 60 patch 1  . losartan (COZAAR) 100 MG tablet Take 1 tablet (100 mg total) by mouth daily. 90 tablet 3  . meclizine (ANTIVERT) 25 MG tablet Take 1 tablet (25 mg total) by mouth 3 (three) times daily as needed for dizziness. 30 tablet 0  . Multiple Vitamins-Minerals (MULTIVITAMIN WITH MINERALS) tablet Take 1 tablet by mouth daily.      . nitroGLYCERIN (NITROSTAT) 0.4 MG SL tablet Place 1  tablet (0.4 mg total) under the tongue every 5 (five) minutes as needed for chest pain. 25 tablet 3  . Omega-3 Fatty Acids (FISH OIL) 1200 MG CAPS Take 1,200 mg by mouth daily.    . ondansetron (ZOFRAN) 4 MG tablet Take 1 tablet (4 mg total) by mouth every 8 (eight) hours as needed for nausea or vomiting. 6 tablet 0  . pantoprazole (PROTONIX) 40 MG tablet TAKE ONE TABLET BY MOUTH ONCE DAILY 90 tablet 0  . PRESCRIPTION MEDICATION Inhale into the lungs at bedtime. CPAP    . spironolactone (ALDACTONE) 25 MG tablet TAKE 1 TABLET BY MOUTH ONCE DAILY 90 tablet 3   No current  facility-administered medications on file prior to visit.    Review of Systems  Constitutional: Negative for other unusual diaphoresis or sweats HENT: Negative for ear discharge or swelling Eyes: Negative for other worsening visual disturbances Respiratory: Negative for stridor or other swelling  Gastrointestinal: Negative for worsening distension or other blood Genitourinary: Negative for retention or other urinary change Musculoskeletal: Negative for other MSK pain or swelling Skin: Negative for color change or other new lesions Neurological: Negative for worsening tremors and other numbness  Psychiatric/Behavioral: Negative for worsening agitation or other fatigue All other system neg per pt    Objective:   Physical Exam BP 132/86 (BP Location: Right Arm, Patient Position: Sitting, Cuff Size: Large)   Pulse 62   Temp 98.4 F (36.9 C) (Oral)   Ht 5\' 5"  (1.651 m)   Wt (!) 392 lb (177.8 kg)   SpO2 96%   BMI 65.23 kg/m  VS noted,  Constitutional: Pt appears in NAD HENT: Head: NCAT.  Right Ear: External ear normal.  Left Ear: External ear normal.  Eyes: . Pupils are equal, round, and reactive to light. Conjunctivae and EOM are normal Nose: without d/c or deformity Neck: Neck supple. Gross normal ROM Cardiovascular: Normal rate and regular rhythm.   Pulmonary/Chest: Effort normal and breath sounds without rales or wheezing.  Abd:  Soft, NT, ND, + BS, no organomegaly Neurological: Pt is alert. At baseline orientation, motor grossly intact Skin: Skin is warm. No rashes, other new lesions, no LE edema Psychiatric: Pt behavior is normal without agitation  No other exam findings  POCT glycosylated hemoglobin (Hb A1C) Order: [id number] Status:  Final result Visible to patient:  No (not released) Dx:  Type 2 diabetes mellitus without comp...  Ref Range & Units 15:54 (12/26/18) 56mo ago (06/22/18) 40yr ago (12/15/17) 33yr ago (06/15/17) 27yr ago (12/15/14) 17yr ago (09/03/14) 80yr ago  (02/11/14)  Hemoglobin A1C 4.0 - 5.6 % 6.8Abnormal   7.6High  R, CM  6.7Abnormal   7.4High  R, CM  6.7High  R, CM  8.3High  R, CM  6.8High  R           Assessment & Plan:

## 2019-01-15 ENCOUNTER — Other Ambulatory Visit: Payer: Self-pay | Admitting: Cardiology

## 2019-01-16 ENCOUNTER — Other Ambulatory Visit: Payer: Self-pay | Admitting: Cardiology

## 2019-01-16 ENCOUNTER — Encounter: Payer: Self-pay | Admitting: Internal Medicine

## 2019-01-17 ENCOUNTER — Encounter: Payer: Self-pay | Admitting: Internal Medicine

## 2019-01-17 ENCOUNTER — Ambulatory Visit: Payer: Federal, State, Local not specified - PPO | Admitting: Internal Medicine

## 2019-01-17 DIAGNOSIS — G4733 Obstructive sleep apnea (adult) (pediatric): Secondary | ICD-10-CM

## 2019-01-17 NOTE — Patient Instructions (Signed)
We can continue CPAP auto 8-20, mask of choice, humidifier, supplies, AirView/ card.  Remember- you need to allow yourself enough time to sleep, and you really will be better off if you make a point of wearing your CPAP anytime you sleep. It protects your heart and your brain.   Please call if we can help

## 2019-01-17 NOTE — Progress Notes (Signed)
HPI M never smoker followed for bronchitis, OSA,, allergic rhinitis complicated by CHF, GERD, morbid obesity Unattended home sleep study 08/26/13- AHI 17/ hr, weight 393 lbs ---------------------------------------------------------------------------  01/15/2018- 42 yoM never smoker followed for bronchitis, OSA,, allergic rhinitis complicated by CHF/CM, GERD, morbid obesity Weight 396 pounds today CPAP auto 8-20/[facility] -----OSA: DME: [facility]. Pt wears CPAP for the most part and feels that pressure of 8-20 works well for him. DL attached.  Download 63 % compliance AHI 0.3/hour.  We reviewed his download together and discussed ways to use CPAP more consistently.  We also discussed weight loss.  He is morbidly overweight.  Does sleep better and does not snore if he uses CPAP. Denies recent cardiac problems, followed by cardiology.  He did not remember there was an association between untreated sleep apnea and cardiac complications.  01/17/2019- 42 yoM never smoker followed for bronchitis, OSA,, allergic rhinitis complicated by CHF/CM, GERD, morbid obesity Weight 396 pounds today CPAP auto 8-20/[facility] -----CPAP Auto 8-20, DME: [facility], no complaints, reports using it nightly Body weight today 388 lbs Download 67% compliance, AHI 0.6/ hr Falls asleep before putting mask on- discussed, with emphasis on sleep habits. Daytime breathing ok- little exercise. Gets up about 3:45 AM for work.  Says no problem with sleepiness driving.  ROS-see HPI   + = positive Constitutional:    weight loss, night sweats, fevers, chills,+fatigue, lassitude. HEENT:    headaches, difficulty swallowing, tooth/dental problems, sore throat,       sneezing, itching, ear ache, nasal congestion, post nasal drip, snoring CV:    chest pain, orthopnea, PND, swelling in lower extremities, anasarca,                                                      dizziness, palpitations Resp:   +shortness of breath with exertion or at rest.                 productive cough,   non-productive cough, coughing up of blood.              change in color of mucus.  wheezing.   Skin:    rash or lesions. GI:  No-   heartburn, indigestion, abdominal pain, nausea, vomiting,  GU:  MS:   joint pain, stiffness,  Neuro-     nothing unusual Psych:  change in mood or affect.  depression or anxiety.   memory loss.  OBJ- Physical Exam General- Alert, Oriented, Affect-appropriate, Distress- none acute, + morbid obesity Skin- rash-none, lesions- none, excoriation- none Lymphadenopathy- none Head- atraumatic            Eyes- Gross vision intact, PERRLA, conjunctivae and secretions clear            Ears- Hearing, canals-normal            Nose- Clear, no-Septal dev, mucus, polyps, erosion, perforation             Throat- Mallampati IV , mucosa clear , drainage- none, tonsils- atrophic Neck- flexible , trachea midline, no stridor , thyroid nl, carotid no bruit Chest - symmetrical excursion , unlabored           Heart/CV- RRR , no murmur , no gallop  , no rub, nl s1 s2                           -  JVD- none , edema- none, stasis changes- none, varices- none           Lung- clear to P&A, wheeze- none, cough- none , dullness-none, rub- none           Chest wall-  Abd-  Br/ Gen/ Rectal- Not done, not indicated Extrem- cyanosis- none, clubbing, none, atrophy- none, strength- nl Neuro- grossly intact to observation    .

## 2019-01-19 ENCOUNTER — Other Ambulatory Visit: Payer: Self-pay | Admitting: Cardiology

## 2019-01-22 ENCOUNTER — Telehealth: Payer: Self-pay | Admitting: Cardiology

## 2019-01-22 NOTE — Telephone Encounter (Signed)
New Message    Pt c/o medication issue:  1. Name of Medication: isosorbide mononitrate (IMDUR) 30 MG 24 hr    2. How are you currently taking this medication (dosage and times per day)?  3. Are you having a reaction (difficulty breathing--STAT)?   4. What is your medication issue? Patient is calling to clarify what rx he should be taken. He is under the impression that isosorbide is to be increased to 60mg . Please call to discuss.

## 2019-01-22 NOTE — Telephone Encounter (Signed)
Pt calling requesting a refill on isosorbide, sent to [facility] on [address]. Please address

## 2019-01-22 NOTE — Telephone Encounter (Signed)
Spoke to patient he wanted to verify medications.Stated he misunderstood instructions on 5/21 tele visit with Dr.Crenshaw.Stated he stopped Spironolactone.Advised he was suppose to stop Hydralazine and stop Isosorbide.Increase Losartan to 100 mg daily.Stated he understands.He will restart Spironolactone 25 mg daily and stop Isosorbide.

## 2019-02-15 NOTE — Telephone Encounter (Addendum)
Issue already addressed by Elly Modena, LPN.

## 2019-02-22 ENCOUNTER — Other Ambulatory Visit: Payer: Self-pay | Admitting: Cardiology

## 2019-02-22 DIAGNOSIS — I429 Cardiomyopathy, unspecified: Secondary | ICD-10-CM

## 2019-03-03 NOTE — Assessment & Plan Note (Signed)
Discussed compliance goals, adequate sleep Does benefit from CPA Plan- continue auto 8-20

## 2019-03-03 NOTE — Assessment & Plan Note (Signed)
His health problems are all about his obesity as discussed. He needs to make an effort. Suggested consideration of Bariatric referral.

## 2019-03-29 ENCOUNTER — Other Ambulatory Visit: Payer: Self-pay | Admitting: Cardiology

## 2019-03-29 DIAGNOSIS — I429 Cardiomyopathy, unspecified: Secondary | ICD-10-CM

## 2019-04-05 ENCOUNTER — Ambulatory Visit: Payer: Federal, State, Local not specified - PPO

## 2019-04-06 ENCOUNTER — Ambulatory Visit (INDEPENDENT_AMBULATORY_CARE_PROVIDER_SITE_OTHER): Payer: Federal, State, Local not specified - PPO

## 2019-04-06 ENCOUNTER — Other Ambulatory Visit: Payer: Self-pay

## 2019-04-06 DIAGNOSIS — Z23 Encounter for immunization: Secondary | ICD-10-CM

## 2019-04-19 NOTE — Progress Notes (Signed)
HPI: FU hypertension and nonischemic cardiomyopathy. Patient had a previous nonischemic cardiomyopathy which improved on fu echos. However he was seen forrecurrentchest pain. Nuclear study June 2016 showed an ejection fraction of 37%. There is an old inferior/inferior lateral infarct with peri-infarct ischemia. Cardiac catheterization July 2016 showed a 90% first posterior lateral branch, 90% distal LAD and 50% proximal RCA. Medical therapy recommended. Ejection fraction 25-35%. Last echocardiogram January 2019 showed ejection fraction 45 to 50%, mild left ventricular hypertrophy, mild left atrial enlargement.  Since last seen,patient denies dyspnea, chest pain, palpitations or syncope.  Current Outpatient Medications  Medication Sig Dispense Refill  . albuterol (PROVENTIL HFA;VENTOLIN HFA) 108 (90 BASE) MCG/ACT inhaler Inhale 2 puffs into the lungs every 6 (six) hours as needed for wheezing. 1 Inhaler 11  . amLODipine (NORVASC) 10 MG tablet Take 1 tablet by mouth once daily 90 tablet 0  . aspirin EC 81 MG tablet Take 81 mg by mouth daily.    Marland Kitchen atorvastatin (LIPITOR) 80 MG tablet Take 1 tablet (80 mg total) by mouth daily. 30 tablet 5  . carvedilol (COREG) 25 MG tablet Take 2 tablets (50 mg total) by mouth 2 (two) times daily with a meal. 120 tablet 6  . cetirizine (ZYRTEC) 10 MG tablet Take 10 mg by mouth daily.    . furosemide (LASIX) 20 MG tablet TAKE 1 TABLET BY MOUTH ONCE DAILY *SCHEDULE OFFICE VISIT FOR FURTHER REFILLS, FINAL ATTEMPT* 90 tablet 0  . lidocaine (LIDODERM) 5 % Place 1 patch onto the skin daily. Remove & Discard patch within 12 hours or as directed by MD 60 patch 1  . losartan (COZAAR) 100 MG tablet Take 1 tablet (100 mg total) by mouth daily. 90 tablet 3  . meclizine (ANTIVERT) 25 MG tablet Take 1 tablet (25 mg total) by mouth 3 (three) times daily as needed for dizziness. 30 tablet 0  . metFORMIN (GLUCOPHAGE) 500 MG tablet Take 1 tablet (500 mg total) by mouth daily  with breakfast. 90 tablet 3  . Multiple Vitamins-Minerals (MULTIVITAMIN WITH MINERALS) tablet Take 1 tablet by mouth daily.      . nitroGLYCERIN (NITROSTAT) 0.4 MG SL tablet Place 1 tablet (0.4 mg total) under the tongue every 5 (five) minutes as needed for chest pain. 25 tablet 3  . Omega-3 Fatty Acids (FISH OIL) 1200 MG CAPS Take 1,200 mg by mouth daily.    . ondansetron (ZOFRAN) 4 MG tablet Take 1 tablet (4 mg total) by mouth every 8 (eight) hours as needed for nausea or vomiting. 6 tablet 0  . pantoprazole (PROTONIX) 40 MG tablet TAKE ONE TABLET BY MOUTH ONCE DAILY 90 tablet 0  . PRESCRIPTION MEDICATION Inhale into the lungs at bedtime. CPAP    . spironolactone (ALDACTONE) 25 MG tablet Take 1 tablet (25 mg total) by mouth daily. 90 tablet 0   No current facility-administered medications for this visit.      Past Medical History:  Diagnosis Date  . ACHILLES TENDINITIS, BILATERAL 10/16/2008  . ALLERGIC RHINITIS 08/13/2009  . CARDIOMYOPATHY 04/21/2009  . CHF (congestive heart failure) ([facility])   . CONGESTIVE HEART FAILURE 12/04/2007  . CORONARY ARTERY DISEASE 12/04/2007  . GERD (gastroesophageal reflux disease) 11/01/2011  . HYPERLIPIDEMIA 03/12/2007  . Hypertension   . HYPERTENSION 03/12/2007  . OBESITY 04/21/2009  . Plantar fasciitis, bilateral 11/01/2011  . SLEEP APNEA 03/12/2007    Past Surgical History:  Procedure Laterality Date  . CARDIAC CATHETERIZATION N/A 12/19/2014   Procedure: Left Heart  Cath and Coronary Angiography;  Surgeon: Belva Crome, MD;  Location: [facility];  Service: Cardiovascular;  Laterality: N/A;  . NO PAST SURGERIES      Social History   Socioeconomic History  . Marital status: Married    Spouse name: Not on file  . Number of children: 2  . Years of education: Not on file  . Highest education level: Not on file  Occupational History  . Occupation: Contractor [employer]    Employer: [employer]  Social Needs  . Financial  resource strain: Not on file  . Food insecurity    Worry: Not on file    Inability: Not on file  . Transportation needs    Medical: Not on file    Non-medical: Not on file  Tobacco Use  . Smoking status: Never Smoker  . Smokeless tobacco: Never Used  Substance and Sexual Activity  . Alcohol use: No  . Drug use: No  . Sexual activity: Not on file  Lifestyle  . Physical activity    Days per week: Not on file    Minutes per session: Not on file  . Stress: Not on file  Relationships  . Social Herbalist on phone: Not on file    Gets together: Not on file    Attends religious service: Not on file    Active member of club or organization: Not on file    Attends meetings of clubs or organizations: Not on file    Relationship status: Not on file  . Intimate partner violence    Fear of current or ex partner: Not on file    Emotionally abused: Not on file    Physically abused: Not on file    Forced sexual activity: Not on file  Other Topics Concern  . Not on file  Social History Narrative  . Not on file    Family History  Problem Relation Age of Onset  . Hypertension Other   . Heart attack Mother   . Heart disease Mother   . Heart attack Father   . Heart disease Father   . Asthma Brother     ROS: no fevers or chills, productive cough, hemoptysis, dysphasia, odynophagia, melena, hematochezia, dysuria, hematuria, rash, seizure activity, orthopnea, PND, pedal edema, claudication. Remaining systems are negative.  Physical Exam: Well-developed obese in no acute distress.  Skin is warm and dry.  HEENT is normal.  Neck is supple.  Chest is clear to auscultation with normal expansion.  Cardiovascular exam is regular rate and rhythm.  Abdominal exam nontender or distended. No masses palpated. Extremities show no edema. neuro grossly intact  ECG-sinus rhythm at a rate of 72, inferior T wave inversion.  Personally reviewed  A/P  1 nonischemic  cardiomyopathy-patient's LV function has improved on most recent echocardiogram.  Will repeat echo. Plan to continue ARB and beta-blocker.  2 hypertension-blood pressure controlled.  Continue present medications and follow.  3 coronary artery disease-patient denies chest pain.  Continue aspirin and statin.  4 hyperlipidemia-continue statin.  Check lipids and liver.  5 chronic combined systolic/diastolic congestive heart failure-he is euvolemic today.  Continue present dose of Lasix.  Continue fluid restriction and low-sodium diet.  Check potassium and renal function.  6 morbid obesity-we again discussed the importance of diet, exercise and weight loss.  Kirk Ruths, MD

## 2019-04-22 ENCOUNTER — Other Ambulatory Visit: Payer: Self-pay

## 2019-04-22 ENCOUNTER — Encounter: Payer: Self-pay | Admitting: Cardiology

## 2019-04-22 ENCOUNTER — Ambulatory Visit: Payer: Federal, State, Local not specified - PPO | Admitting: Cardiology

## 2019-04-22 VITALS — BP 118/70 | HR 72 | Ht 69.0 in | Wt 396.0 lb

## 2019-04-22 DIAGNOSIS — I428 Other cardiomyopathies: Secondary | ICD-10-CM | POA: Diagnosis not present

## 2019-04-22 DIAGNOSIS — I10 Essential (primary) hypertension: Secondary | ICD-10-CM

## 2019-04-22 DIAGNOSIS — E7800 Pure hypercholesterolemia, unspecified: Secondary | ICD-10-CM | POA: Diagnosis not present

## 2019-04-22 DIAGNOSIS — I2510 Atherosclerotic heart disease of native coronary artery without angina pectoris: Secondary | ICD-10-CM | POA: Diagnosis not present

## 2019-04-22 NOTE — Patient Instructions (Signed)
Medication Instructions:  NO CHANGE *If you need a refill on your cardiac medications before your next appointment, please call your pharmacy*  Lab Work: Your physician recommends that you return for lab work PRIOR TO EATING  If you have labs (blood work) drawn today and your tests are completely normal, you will receive your results only by: . MyChart Message (if you have MyChart) OR . A paper copy in the mail If you have any lab test that is abnormal or we need to change your treatment, we will call you to review the results.  Testing/Procedures: Your physician has requested that you have an echocardiogram. Echocardiography is a painless test that uses sound waves to create images of your heart. It provides your doctor with information about the size and shape of your heart and how well your heart's chambers and valves are working. This procedure takes approximately one hour. There are no restrictions for this procedure.[address]    Follow-Up: At [facility], you and your health needs are our priority.  As part of our continuing mission to provide you with exceptional heart care, we have created designated Provider Care Teams.  These Care Teams include your primary Cardiologist (physician) and Advanced Practice Providers (APPs -  Physician Assistants and Nurse Practitioners) who all work together to provide you with the care you need, when you need it.  Your next appointment:   12 months  The format for your next appointment:   In Person  Provider:   Brian Crenshaw, MD    

## 2019-04-30 ENCOUNTER — Other Ambulatory Visit: Payer: Self-pay

## 2019-04-30 ENCOUNTER — Ambulatory Visit (HOSPITAL_COMMUNITY): Payer: Federal, State, Local not specified - PPO | Attending: Internal Medicine

## 2019-04-30 DIAGNOSIS — I428 Other cardiomyopathies: Secondary | ICD-10-CM | POA: Diagnosis not present

## 2019-05-01 DIAGNOSIS — E7800 Pure hypercholesterolemia, unspecified: Secondary | ICD-10-CM | POA: Diagnosis not present

## 2019-05-02 ENCOUNTER — Telehealth: Payer: Self-pay | Admitting: *Deleted

## 2019-05-02 DIAGNOSIS — I2510 Atherosclerotic heart disease of native coronary artery without angina pectoris: Secondary | ICD-10-CM

## 2019-05-02 LAB — COMPREHENSIVE METABOLIC PANEL
ALT: 34 IU/L (ref 0–44)
AST: 23 IU/L (ref 0–40)
Albumin/Globulin Ratio: 1.4 (ref 1.2–2.2)
Albumin: 4.1 g/dL (ref 4.0–5.0)
Alkaline Phosphatase: 90 IU/L (ref 39–117)
BUN/Creatinine Ratio: 17 (ref 9–20)
BUN: 16 mg/dL (ref 6–24)
Bilirubin Total: 0.3 mg/dL (ref 0.0–1.2)
CO2: 21 mmol/L (ref 20–29)
Calcium: 8.9 mg/dL (ref 8.7–10.2)
Chloride: 105 mmol/L (ref 96–106)
Creatinine, Ser: 0.94 mg/dL (ref 0.76–1.27)
GFR calc Af Amer: 113 mL/min/{1.73_m2} (ref >59)
GFR calc non Af Amer: 98 mL/min/{1.73_m2} (ref >59)
Globulin, Total: 3 g/dL (ref 1.5–4.5)
Glucose: 117 mg/dL — ABNORMAL HIGH (ref 65–99)
Potassium: 4.4 mmol/L (ref 3.5–5.2)
Sodium: 139 mmol/L (ref 134–144)
Total Protein: 7.1 g/dL (ref 6.0–8.5)

## 2019-05-02 LAB — LIPID PANEL
Chol/HDL Ratio: 3.6 ratio (ref 0.0–5.0)
Cholesterol, Total: 139 mg/dL (ref 100–199)
HDL: 39 mg/dL — ABNORMAL LOW (ref >39)
LDL Chol Calc (NIH): 88 mg/dL (ref 0–99)
Triglycerides: 56 mg/dL (ref 0–149)
VLDL Cholesterol Cal: 12 mg/dL (ref 5–40)

## 2019-05-02 NOTE — Telephone Encounter (Addendum)
Unable to reach pt or leave a message   ----- Message from Lelon Perla, MD sent at 05/02/2019  6:40 AM EST ----- Add zetia 10 mg daily; lipids and liver 12 weeks Kirk Ruths  Result Notes for ECHOCARDIOGRAM COMPLETE  Notes recorded by Lelon Perla, MD on 04/30/2019 at 5:06 PM EST  LV function appears similar to previous  Kirk Ruths

## 2019-05-09 ENCOUNTER — Encounter: Payer: Self-pay | Admitting: *Deleted

## 2019-05-09 ENCOUNTER — Telehealth: Payer: Self-pay | Admitting: Cardiology

## 2019-05-09 MED ORDER — EZETIMIBE 10 MG PO TABS: 10.0000 mg | ORAL_TABLET | Freq: Every day | ORAL | 3 refills | Status: DC

## 2019-05-09 NOTE — Telephone Encounter (Signed)
Spoke with pt, aware of lab and echo results. Aware letter of results sent in the mail to the patient.

## 2019-05-09 NOTE — Telephone Encounter (Signed)
This encounter was created in error - please disregard.

## 2019-05-09 NOTE — Telephone Encounter (Signed)
New Message:     Pt wants to know if he still need to continue taking his old Cholesterol medicine? He said his Pharmacist said a new cholesterol medicine was called in today. Please call him after 1:30 and let him know please. He gets off of work at 1:30.

## 2019-06-12 DIAGNOSIS — I2510 Atherosclerotic heart disease of native coronary artery without angina pectoris: Secondary | ICD-10-CM | POA: Diagnosis not present

## 2019-06-13 ENCOUNTER — Encounter: Payer: Self-pay | Admitting: *Deleted

## 2019-06-13 LAB — HEPATIC FUNCTION PANEL
ALT: 39 IU/L (ref 0–44)
AST: 19 IU/L (ref 0–40)
Albumin: 4.1 g/dL (ref 4.0–5.0)
Alkaline Phosphatase: 88 IU/L (ref 39–117)
Bilirubin Total: 0.3 mg/dL (ref 0.0–1.2)
Bilirubin, Direct: 0.1 mg/dL (ref 0.00–0.40)
Total Protein: 7.2 g/dL (ref 6.0–8.5)

## 2019-06-13 LAB — LIPID PANEL
Chol/HDL Ratio: 3.6 ratio (ref 0.0–5.0)
Cholesterol, Total: 142 mg/dL (ref 100–199)
HDL: 39 mg/dL — ABNORMAL LOW (ref >39)
LDL Chol Calc (NIH): 87 mg/dL (ref 0–99)
Triglycerides: 85 mg/dL (ref 0–149)
VLDL Cholesterol Cal: 16 mg/dL (ref 5–40)

## 2019-06-23 ENCOUNTER — Other Ambulatory Visit: Payer: Self-pay | Admitting: Cardiology

## 2019-06-23 DIAGNOSIS — I429 Cardiomyopathy, unspecified: Secondary | ICD-10-CM

## 2019-07-13 ENCOUNTER — Other Ambulatory Visit: Payer: Self-pay | Admitting: Cardiology

## 2019-07-13 DIAGNOSIS — E785 Hyperlipidemia, unspecified: Secondary | ICD-10-CM

## 2019-07-13 DIAGNOSIS — I429 Cardiomyopathy, unspecified: Secondary | ICD-10-CM

## 2019-08-27 DIAGNOSIS — G4733 Obstructive sleep apnea (adult) (pediatric): Secondary | ICD-10-CM | POA: Diagnosis not present

## 2019-10-01 DIAGNOSIS — G4733 Obstructive sleep apnea (adult) (pediatric): Secondary | ICD-10-CM | POA: Diagnosis not present

## 2019-11-05 DIAGNOSIS — H40003 Preglaucoma, unspecified, bilateral: Secondary | ICD-10-CM | POA: Diagnosis not present

## 2020-01-20 ENCOUNTER — Ambulatory Visit: Payer: Federal, State, Local not specified - PPO | Admitting: Internal Medicine

## 2020-01-20 ENCOUNTER — Encounter: Payer: Self-pay | Admitting: Internal Medicine

## 2020-01-20 ENCOUNTER — Other Ambulatory Visit: Payer: Self-pay

## 2020-01-20 DIAGNOSIS — G4733 Obstructive sleep apnea (adult) (pediatric): Secondary | ICD-10-CM | POA: Diagnosis not present

## 2020-01-20 NOTE — Progress Notes (Signed)
HPI M never smoker followed for bronchitis, OSA,, allergic rhinitis complicated by CHF, GERD, morbid obesity Unattended home sleep study 08/26/13- AHI 17/ hr, weight 393 lbs ---------------------------------------------------------------------------   01/17/2019- 42 yoM never smoker followed for bronchitis, OSA,, allergic rhinitis complicated by CHF/CM, GERD, morbid obesity Weight 396 pounds today CPAP auto 8-20/[facility] -----CPAP Auto 8-20, DME: [facility], no complaints, reports using it nightly Body weight today 388 lbs Download 67% compliance, AHI 0.6/ hr Falls asleep before putting mask on- discussed, with emphasis on sleep habits. Daytime breathing ok- little exercise. Gets up about 3:45 AM for work.  Says no problem with sleepiness driving.  01/20/20- 44 yoM never smoker followed for bronchitis, OSA,, allergic rhinitis complicated by CHF/CM, GERD, morbid obesity Weight 396 pounds today CPAP auto 8-20/[facility] Download compliance 90%, AHI 0.4/ hr    FF mask    Feels very satisfied. Had 2 Phizer Covax  ROS-see HPI   + = positive Constitutional:    weight loss, night sweats, fevers, chills,+fatigue, lassitude. HEENT:    headaches, difficulty swallowing, tooth/dental problems, sore throat,       sneezing, itching, ear ache, nasal congestion, post nasal drip, snoring CV:    chest pain, orthopnea, PND, swelling in lower extremities, anasarca,                                                      dizziness, palpitations Resp:   +shortness of breath with exertion or at rest.                productive cough,   non-productive cough, coughing up of blood.              change in color of mucus.  wheezing.   Skin:    rash or lesions. GI:  No-   heartburn, indigestion, abdominal pain, nausea, vomiting,  GU:  MS:   joint pain, stiffness,  Neuro-     nothing unusual Psych:  change in mood or affect.  depression or anxiety.   memory loss.  OBJ- Physical Exam General- Alert, Oriented, Affect-appropriate,  Distress- none acute, + morbid obesity Skin- rash-none, lesions- none, excoriation- none Lymphadenopathy- none Head- atraumatic            Eyes- Gross vision intact, PERRLA, conjunctivae and secretions clear            Ears- Hearing, canals-normal            Nose- Clear, no-Septal dev, mucus, polyps, erosion, perforation             Throat- Mallampati IV , mucosa clear , drainage- none, tonsils- atrophic Neck- flexible , trachea midline, no stridor , thyroid nl, carotid no bruit Chest - symmetrical excursion , unlabored           Heart/CV- RRR , no murmur , no gallop  , no rub, nl s1 s2                           - JVD- none , edema- none, stasis changes- none, varices- none           Lung- clear to P&A, wheeze- none, cough- none , dullness-none, rub- none           Chest wall-  Abd-  Br/ Gen/ Rectal- Not done, not  indicated Extrem- cyanosis- none, clubbing, none, atrophy- none, strength- nl Neuro- grossly intact to observation    .

## 2020-01-20 NOTE — Patient Instructions (Signed)
We can continue CPAP auto 8-20  Please call if we can help 

## 2020-01-28 ENCOUNTER — Other Ambulatory Visit: Payer: Self-pay | Admitting: Cardiology

## 2020-01-28 ENCOUNTER — Other Ambulatory Visit: Payer: Self-pay | Admitting: Internal Medicine

## 2020-01-28 DIAGNOSIS — I10 Essential (primary) hypertension: Secondary | ICD-10-CM

## 2020-01-28 NOTE — Telephone Encounter (Signed)
Please refill as per office routine med refill policy (all routine meds refilled for 3 mo or monthly per pt preference up to one year from last visit, then month to month grace period for 3 mo, then further med refills will have to be denied)  

## 2020-02-02 DIAGNOSIS — Z20822 Contact with and (suspected) exposure to covid-19: Secondary | ICD-10-CM | POA: Diagnosis not present

## 2020-02-02 NOTE — Assessment & Plan Note (Signed)
Benefits from CPAP with good compliance and control Plan-continue auto 8-20 

## 2020-02-02 NOTE — Assessment & Plan Note (Signed)
Again pressed him to take initiative toward weight loss Plan- diet and exercise, encouragement

## 2020-02-04 DIAGNOSIS — Z20822 Contact with and (suspected) exposure to covid-19: Secondary | ICD-10-CM | POA: Diagnosis not present

## 2020-02-23 ENCOUNTER — Other Ambulatory Visit: Payer: Self-pay | Admitting: Cardiology

## 2020-05-12 ENCOUNTER — Ambulatory Visit: Payer: Federal, State, Local not specified - PPO | Attending: Internal Medicine

## 2020-05-12 DIAGNOSIS — Z23 Encounter for immunization: Secondary | ICD-10-CM

## 2020-05-12 NOTE — Progress Notes (Signed)
   Covid-19 Vaccination Clinic  Name:  Tyler [name]    MRN: [id number] DOB: 11-Apr-1976  05/12/2020  [name] was observed post Covid-19 immunization for 15 minutes without incident. He was provided with Vaccine Information Sheet and instruction to access the V-Safe system.   [name] was instructed to call 911 with any severe reactions post vaccine: Marland Kitchen Difficulty breathing  . Swelling of face and throat  . A fast heartbeat  . A bad rash all over body  . Dizziness and weakness   Immunizations Administered    Name Date Dose VIS Date Route   Pfizer COVID-19 Vaccine 05/12/2020  2:23 PM 0.3 mL 04/08/2020 Intramuscular   Manufacturer: [employer], [employer]   Lot: [id number]   NDC: [address]-7

## 2020-05-25 ENCOUNTER — Other Ambulatory Visit: Payer: Self-pay | Admitting: Cardiology

## 2020-05-25 DIAGNOSIS — I10 Essential (primary) hypertension: Secondary | ICD-10-CM

## 2020-06-01 ENCOUNTER — Other Ambulatory Visit (HOSPITAL_COMMUNITY): Payer: Self-pay | Admitting: Physician Assistant

## 2020-06-01 DIAGNOSIS — J111 Influenza due to unidentified influenza virus with other respiratory manifestations: Secondary | ICD-10-CM | POA: Diagnosis not present

## 2020-06-01 DIAGNOSIS — R059 Cough, unspecified: Secondary | ICD-10-CM | POA: Diagnosis not present

## 2020-06-08 ENCOUNTER — Other Ambulatory Visit: Payer: Self-pay

## 2020-06-08 ENCOUNTER — Other Ambulatory Visit: Payer: Self-pay | Admitting: Family

## 2020-06-08 ENCOUNTER — Telehealth (INDEPENDENT_AMBULATORY_CARE_PROVIDER_SITE_OTHER): Payer: Federal, State, Local not specified - PPO | Admitting: Family

## 2020-06-08 DIAGNOSIS — E119 Type 2 diabetes mellitus without complications: Secondary | ICD-10-CM

## 2020-06-08 DIAGNOSIS — J209 Acute bronchitis, unspecified: Secondary | ICD-10-CM | POA: Diagnosis not present

## 2020-06-08 MED ORDER — AZITHROMYCIN 250 MG PO TABS: ORAL_TABLET | ORAL | 0 refills | Status: DC

## 2020-06-08 MED ORDER — DEXTROMETHORPHAN-GUAIFENESIN 10-200 MG/5ML PO LIQD: ORAL | 0 refills | Status: DC

## 2020-06-08 NOTE — Progress Notes (Signed)
Tyler [name] is a 44 y.o. male with the following history as recorded in EpicCare:  Patient Active Problem List   Diagnosis Date Noted  . Leg mass, left 08/28/2018  . Left elbow pain 06/22/2018  . Pain of left heel 12/15/2017  . Vertigo 07/18/2017  . Left ear hearing loss 07/18/2017  . Acute right-sided low back pain without sciatica 10/01/2016  . Abnormal myocardial perfusion study 12/19/2014  . Mild intermittent acute asthmatic bronchitis 11/14/2014  . Wheezing 09/03/2014  . Dyspnea 03/19/2013  . Nausea alone 11/27/2012  . Plantar fasciitis, bilateral 11/01/2011  . Diabetes (HCC) 11/01/2011  . Cutaneous skin tags 11/01/2011  . GERD (gastroesophageal reflux disease) 11/01/2011  . Encounter for well adult exam with abnormal findings 04/28/2011  . Ingrown nail 04/28/2011  . ALLERGIC RHINITIS 08/13/2009  . Morbid obesity (HCC) 04/21/2009  . CARDIOMYOPATHY 04/21/2009  . ACHILLES TENDINITIS, BILATERAL 10/16/2008  . Coronary atherosclerosis 12/04/2007  . Congestive heart failure (HCC) 12/04/2007  . Dyslipidemia 03/12/2007  . Essential hypertension 03/12/2007  . OSA (obstructive sleep apnea) 03/12/2007    Current Outpatient Medications  Medication Sig Dispense Refill  . albuterol (PROVENTIL HFA;VENTOLIN HFA) 108 (90 BASE) MCG/ACT inhaler Inhale 2 puffs into the lungs every 6 (six) hours as needed for wheezing. 1 Inhaler 11  . amLODipine (NORVASC) 10 MG tablet Take 1 tablet by mouth once daily 90 tablet 2  . aspirin EC 81 MG tablet Take 81 mg by mouth daily.    Marland Kitchen atorvastatin (LIPITOR) 80 MG tablet Take 1 tablet by mouth once daily 90 tablet 2  . azithromycin (ZITHROMAX) 250 MG tablet 2 tabs po qd x 1 day; 1 tablet per day x 4 days; 6 tablet 0  . carvedilol (COREG) 25 MG tablet TAKE 2 TABLETS BY MOUTH TWICE DAILY WITH A MEAL 120 tablet 0  . cetirizine (ZYRTEC) 10 MG tablet Take 10 mg by mouth daily.    Marland Kitchen Dextromethorphan-guaiFENesin 10-200 MG/5ML LIQD Take 1-2 tsp every 8  hours prn for cough 118 mL 0  . ezetimibe (ZETIA) 10 MG tablet Take 1 tablet (10 mg total) by mouth daily. 90 tablet 3  . furosemide (LASIX) 20 MG tablet Take 1 tablet (20 mg total) by mouth daily. 90 tablet 2  . lidocaine (LIDODERM) 5 % Place 1 patch onto the skin daily. Remove & Discard patch within 12 hours or as directed by MD 60 patch 1  . losartan (COZAAR) 100 MG tablet Take 1 tablet by mouth once daily 90 tablet 0  . meclizine (ANTIVERT) 25 MG tablet Take 1 tablet (25 mg total) by mouth 3 (three) times daily as needed for dizziness. 30 tablet 0  . metFORMIN (GLUCOPHAGE) 500 MG tablet Take 1 tablet by mouth once daily with breakfast 90 tablet 0  . Multiple Vitamins-Minerals (MULTIVITAMIN WITH MINERALS) tablet Take 1 tablet by mouth daily.      . nitroGLYCERIN (NITROSTAT) 0.4 MG SL tablet Place 1 tablet (0.4 mg total) under the tongue every 5 (five) minutes as needed for chest pain. 25 tablet 3  . Omega-3 Fatty Acids (FISH OIL) 1200 MG CAPS Take 1,200 mg by mouth daily.    . ondansetron (ZOFRAN) 4 MG tablet Take 1 tablet (4 mg total) by mouth every 8 (eight) hours as needed for nausea or vomiting. 6 tablet 0  . pantoprazole (PROTONIX) 40 MG tablet TAKE ONE TABLET BY MOUTH ONCE DAILY 90 tablet 0  . PRESCRIPTION MEDICATION Inhale into the lungs at bedtime. CPAP    .  spironolactone (ALDACTONE) 25 MG tablet Take 1 tablet by mouth once daily 90 tablet 3   No current facility-administered medications for this visit.    Allergies: Patient has no known allergies.  Past Medical History:  Diagnosis Date  . ACHILLES TENDINITIS, BILATERAL 10/16/2008  . ALLERGIC RHINITIS 08/13/2009  . CARDIOMYOPATHY 04/21/2009  . CHF (congestive heart failure) (HCC)   . CONGESTIVE HEART FAILURE 12/04/2007  . CORONARY ARTERY DISEASE 12/04/2007  . GERD (gastroesophageal reflux disease) 11/01/2011  . HYPERLIPIDEMIA 03/12/2007  . Hypertension   . HYPERTENSION 03/12/2007  . OBESITY 04/21/2009  . Plantar fasciitis, bilateral  11/01/2011  . SLEEP APNEA 03/12/2007    Past Surgical History:  Procedure Laterality Date  . CARDIAC CATHETERIZATION N/A 12/19/2014   Procedure: Left Heart Cath and Coronary Angiography;  Surgeon: Lyn Records, MD;  Location: [facility] INVASIVE CV LAB;  Service: Cardiovascular;  Laterality: N/A;  . NO PAST SURGERIES      Family History  Problem Relation Age of Onset  . Hypertension Other   . Heart attack Mother   . Heart disease Mother   . Heart attack Father   . Heart disease Father   . Asthma Brother     Social History   Tobacco Use  . Smoking status: Never Smoker  . Smokeless tobacco: Never Used  Substance Use Topics  . Alcohol use: No    Subjective:   I connected with Johny Shears on 06/08/20 at 11:00 AM EST by a telephone call and verified that I am speaking with the correct person using two identifiers.   I discussed the limitations of evaluation and management by telemedicine and the availability of in person appointments. The patient expressed understanding and agreed to proceed.  Provider in office/ patient is at home; provider and patient are only 2 people on telephone call.   Patient was seen at [facility] last week and diagnosed with the flu; his COVID test was negative; was diagnosed last Monday and now concerned that symptoms have progressed into secondary bronchitis; no chest pain or shortness of breath;  Is asking for possible refill on Tussionex but has not been seen by his PCP since July 2020- unsure of status of his diabetes/ does not check his numbers;    Objective:  There were no vitals filed for this visit.  Lungs: Respirations unlabored;  Neurologic: Alert and oriented; speech intact; face symmetrical; moves all extremities well; CNII-XII intact without focal deficit   Assessment:  1. Acute bronchitis, unspecified organism   2. Type 2 diabetes mellitus without complication, without long-term current use of insulin (HCC)     Plan:  1. Secondary to flu; Rx  for Z-pak and Diabetic Tussin; do not feel comfortable giving Tussionex at this time; if symptoms persist, will need to consider CXR; 2. Schedule for in office visit with his PCP for 06/24/19; overdue for labs;  Time spent 12 minutes  No follow-ups on file.  No orders of the defined types were placed in this encounter.   Requested Prescriptions   Signed Prescriptions Disp Refills  . azithromycin (ZITHROMAX) 250 MG tablet 6 tablet 0    Sig: 2 tabs po qd x 1 day; 1 tablet per day x 4 days;  . Dextromethorphan-guaiFENesin 10-200 MG/5ML LIQD 118 mL 0    Sig: Take 1-2 tsp every 8 hours prn for cough

## 2020-06-19 ENCOUNTER — Other Ambulatory Visit (HOSPITAL_COMMUNITY): Payer: Self-pay | Admitting: Nurse Practitioner

## 2020-06-19 DIAGNOSIS — R059 Cough, unspecified: Secondary | ICD-10-CM | POA: Diagnosis not present

## 2020-06-19 DIAGNOSIS — U071 COVID-19: Secondary | ICD-10-CM | POA: Diagnosis not present

## 2020-06-21 ENCOUNTER — Other Ambulatory Visit: Payer: Self-pay | Admitting: Cardiology

## 2020-06-21 ENCOUNTER — Other Ambulatory Visit: Payer: Self-pay | Admitting: Internal Medicine

## 2020-06-21 DIAGNOSIS — I429 Cardiomyopathy, unspecified: Secondary | ICD-10-CM

## 2020-06-21 DIAGNOSIS — E785 Hyperlipidemia, unspecified: Secondary | ICD-10-CM

## 2020-06-22 ENCOUNTER — Telehealth: Payer: Self-pay | Admitting: Internal Medicine

## 2020-06-22 NOTE — Telephone Encounter (Signed)
[facility]   Caller was dx with COVID today at [facility]. He would like to know if the on call can be paged to order the monoclonal antibody infusion. He says he has health conditions that may this high risk for him. His current sx are cough, sinus drainage, chills, wheezing, and body aches; vomiting from coughing; vomiting anything eats.  [facility] advised: Contact PCP within 24 Hours   Please advise.

## 2020-06-22 NOTE — Telephone Encounter (Signed)
Spoke with and pt and pt has stated he was given a Z-pack, Vitamin D, Vitamin C and zinc to take.  Pt has been Fully vaccinated a/w getting his booster.

## 2020-06-22 NOTE — Telephone Encounter (Signed)
Yes, please contact pt , as if he is UNVACCINATED and SYMPTOMS less than 7 days, he would qualify for referral to the [facility] ab hotline;  let me know either way

## 2020-06-22 NOTE — Telephone Encounter (Signed)
Ok to continue as is, as he does not qualify for the monoclonal ab tx

## 2020-06-22 NOTE — Telephone Encounter (Signed)
Please refill as per office routine med refill policy (all routine meds refilled for 3 mo or monthly per pt preference up to one year from last visit, then month to month grace period for 3 mo, then further med refills will have to be denied)  

## 2020-06-22 NOTE — Telephone Encounter (Signed)
Sent to Dr. John. 

## 2020-06-23 ENCOUNTER — Encounter: Payer: Self-pay | Admitting: Internal Medicine

## 2020-06-23 ENCOUNTER — Other Ambulatory Visit: Payer: Self-pay | Admitting: Internal Medicine

## 2020-06-23 ENCOUNTER — Telehealth: Payer: Self-pay

## 2020-06-23 ENCOUNTER — Telehealth (INDEPENDENT_AMBULATORY_CARE_PROVIDER_SITE_OTHER): Payer: Federal, State, Local not specified - PPO | Admitting: Internal Medicine

## 2020-06-23 DIAGNOSIS — U071 COVID-19: Secondary | ICD-10-CM | POA: Insufficient documentation

## 2020-06-23 DIAGNOSIS — E1165 Type 2 diabetes mellitus with hyperglycemia: Secondary | ICD-10-CM | POA: Diagnosis not present

## 2020-06-23 MED ORDER — DEXAMETHASONE 6 MG PO TABS
6.0000 mg | ORAL_TABLET | Freq: Two times a day (BID) | ORAL | 0 refills | Status: DC
Start: 2020-06-23 — End: 2020-09-07

## 2020-06-23 NOTE — Assessment & Plan Note (Addendum)
Recent diagnosis but no longer candidate for monoclonal ab, but may be ok for IV remdesivir 3 day - will notify [facility] hotline to try to have this done, also for decadron 6 mg qd, and pt to go to ED or UC again for worsening s/s such as sob  I spent total 34  minutes in caring for the patient for this visit:  1) by communicating with the patient during the visit  2) by review of pertinent vital sign data, physical examination and labs as documented in the assessment and plan  3) by review of pertinent imaging - none today  4) by review of pertinent procedures - none today  5) by obtaining and reviewing separately obtained information from family/caretaker and Care Everywhere - none today  6) by ordering medications  7) by ordering tests and arranging with the monoclonal ab hotline  8) by documenting all of this clinical information in the EHR including the management of each problem noted today in assessment and plan

## 2020-06-23 NOTE — Patient Instructions (Signed)
Please take all new medication as prescribed - the decadron  We will contact the [facility] hotline to hopefully get the IV remdesivir, as you do not qualify for the monoclonal antibody treatment  Please continue all other medications as before, including the symbicort, albuterol, vit c vit d and zinc  Please have the pharmacy call with any other refills you may need.  Please keep your appointments with your specialists as you may have planned

## 2020-06-23 NOTE — Telephone Encounter (Signed)
MAB team notified to contact patient to see if he qualifies for remdesivir.

## 2020-06-23 NOTE — Assessment & Plan Note (Signed)
Lab Results  Component Value Date   HGBA1C 6.8 (A) 12/26/2018  stable, cont current metformin

## 2020-06-23 NOTE — Progress Notes (Signed)
Patient ID: Tyler [name], male   DOB: [birth year], 45 y.o.   MRN: [id number]  Virtual Visit via Video Note  I connected with Tyler [name] on 06/23/20 at  2:00 PM EST by a video enabled telemedicine application and verified that I am speaking with the correct person using two identifiers.  Location of all participants today Patient: at home Provider: at office   I discussed the limitations of evaluation and management by telemedicine and the availability of in person appointments. The patient expressed understanding and agreed to proceed.  CC here with covid infection, recent influenza and DM  History of Present Illness: Pt here after ill with influenza approx dec 14, took the 1 pill tx for this per UC, then 1 wk later still with cough so tx at UC with zpack, and tessalon perle, then overall improved.  Was back to work 6 days ago for 3 days, but developed cough again on dec 31, tested + dec 31, tx at UC with zpack, vit d, vit d and zinc as well as symbicort   Pt has had Vax x 2 and booster as well.  Pt denies chest pain, increased sob or doe, wheezing, orthopnea, PND, increased LE swelling, palpitations, dizziness or syncope.  Pt denies new neurological symptoms such as new headache, or facial or extremity weakness or numbness   Pt denies polydipsia, polyuria, Past Medical History:  Diagnosis Date  . ACHILLES TENDINITIS, BILATERAL 10/16/2008  . ALLERGIC RHINITIS 08/13/2009  . CARDIOMYOPATHY 04/21/2009  . CHF (congestive heart failure) (HCC)   . CONGESTIVE HEART FAILURE 12/04/2007  . CORONARY ARTERY DISEASE 12/04/2007  . GERD (gastroesophageal reflux disease) 11/01/2011  . HYPERLIPIDEMIA 03/12/2007  . Hypertension   . HYPERTENSION 03/12/2007  . OBESITY 04/21/2009  . Plantar fasciitis, bilateral 11/01/2011  . SLEEP APNEA 03/12/2007   Past Surgical History:  Procedure Laterality Date  . CARDIAC CATHETERIZATION N/A 12/19/2014   Procedure: Left Heart Cath and Coronary Angiography;  Surgeon:  Lyn Records, MD;  Location: [facility] INVASIVE CV LAB;  Service: Cardiovascular;  Laterality: N/A;  . NO PAST SURGERIES      reports that he has never smoked. He has never used smokeless tobacco. He reports that he does not drink alcohol and does not use drugs. family history includes Asthma in his brother; Heart attack in his father and mother; Heart disease in his father and mother; Hypertension in an other family member. No Known Allergies Current Outpatient Medications on File Prior to Visit  Medication Sig Dispense Refill  . albuterol (PROVENTIL HFA;VENTOLIN HFA) 108 (90 BASE) MCG/ACT inhaler Inhale 2 puffs into the lungs every 6 (six) hours as needed for wheezing. 1 Inhaler 11  . amLODipine (NORVASC) 10 MG tablet Take 1 tablet by mouth once daily 90 tablet 0  . aspirin EC 81 MG tablet Take 81 mg by mouth daily.    Marland Kitchen atorvastatin (LIPITOR) 80 MG tablet Take 1 tablet by mouth once daily 90 tablet 0  . azithromycin (ZITHROMAX) 250 MG tablet 2 tabs po qd x 1 day; 1 tablet per day x 4 days; 6 tablet 0  . carvedilol (COREG) 25 MG tablet TAKE 2 TABLETS BY MOUTH TWICE DAILY WITH A MEAL 120 tablet 0  . cetirizine (ZYRTEC) 10 MG tablet Take 10 mg by mouth daily.    Marland Kitchen Dextromethorphan-guaiFENesin 10-200 MG/5ML LIQD Take 1-2 tsp every 8 hours prn for cough 118 mL 0  . ezetimibe (ZETIA) 10 MG tablet Take 1 tablet (10  mg total) by mouth daily. 90 tablet 3  . furosemide (LASIX) 20 MG tablet Take 1 tablet by mouth once daily 90 tablet 0  . lidocaine (LIDODERM) 5 % Place 1 patch onto the skin daily. Remove & Discard patch within 12 hours or as directed by MD 60 patch 1  . losartan (COZAAR) 100 MG tablet Take 1 tablet by mouth once daily 90 tablet 0  . meclizine (ANTIVERT) 25 MG tablet Take 1 tablet (25 mg total) by mouth 3 (three) times daily as needed for dizziness. 30 tablet 0  . metFORMIN (GLUCOPHAGE) 500 MG tablet Take 1 tablet by mouth once daily with breakfast 90 tablet 0  . Multiple Vitamins-Minerals  (MULTIVITAMIN WITH MINERALS) tablet Take 1 tablet by mouth daily.      . nitroGLYCERIN (NITROSTAT) 0.4 MG SL tablet Place 1 tablet (0.4 mg total) under the tongue every 5 (five) minutes as needed for chest pain. 25 tablet 3  . Omega-3 Fatty Acids (FISH OIL) 1200 MG CAPS Take 1,200 mg by mouth daily.    . ondansetron (ZOFRAN) 4 MG tablet Take 1 tablet (4 mg total) by mouth every 8 (eight) hours as needed for nausea or vomiting. 6 tablet 0  . pantoprazole (PROTONIX) 40 MG tablet TAKE ONE TABLET BY MOUTH ONCE DAILY 90 tablet 0  . PRESCRIPTION MEDICATION Inhale into the lungs at bedtime. CPAP    . spironolactone (ALDACTONE) 25 MG tablet Take 1 tablet by mouth once daily 90 tablet 3   No current facility-administered medications on file prior to visit.    Observations/Objective: Alert, NAD, appropriate mood and affect, resps normal, cn 2-12 intact, moves all 4s, no visible rash or swelling Lab Results  Component Value Date   WBC 8.2 06/22/2018   HGB 14.0 06/22/2018   HCT 41.7 06/22/2018   PLT 220.0 06/22/2018   GLUCOSE 117 (H) 05/01/2019   CHOL 142 06/12/2019   TRIG 85 06/12/2019   HDL 39 (L) 06/12/2019   LDLDIRECT 183.6 08/02/2011   LDLCALC 87 06/12/2019   ALT 39 06/12/2019   AST 19 06/12/2019   NA 139 05/01/2019   K 4.4 05/01/2019   CL 105 05/01/2019   CREATININE 0.94 05/01/2019   BUN 16 05/01/2019   CO2 21 05/01/2019   TSH 2.58 06/22/2018   PSA 0.71 06/22/2018   INR 1.2 (H) 12/15/2014   HGBA1C 6.8 (A) 12/26/2018   MICROALBUR <0.7 06/22/2018   Assessment and Plan: See notes  Follow Up Instructions: See notes   I discussed the assessment and treatment plan with the patient. The patient was provided an opportunity to ask questions and all were answered. The patient agreed with the plan and demonstrated an understanding of the instructions.   The patient was advised to call back or seek an in-person evaluation if the symptoms worsen or if the condition fails to improve as  anticipated.  Oliver Barre, MD

## 2020-06-25 ENCOUNTER — Telehealth: Payer: Self-pay | Admitting: Infectious Diseases

## 2020-06-26 ENCOUNTER — Telehealth: Payer: Self-pay | Admitting: Internal Medicine

## 2020-06-26 DIAGNOSIS — R1310 Dysphagia, unspecified: Secondary | ICD-10-CM

## 2020-06-26 NOTE — Telephone Encounter (Signed)
   Patient calling to report he started eating pizza yesterday and started having hiccups. Patient states everything he eats or drinks causes him to have hiccups.  Seeking advice

## 2020-06-30 NOTE — Telephone Encounter (Signed)
Sorry I dont have any specific tx for this b/c it often runs its course.  There is thorazine but since it often goes away, I was hoping to not have to do this

## 2020-07-01 NOTE — Telephone Encounter (Signed)
Pt states he believes that the dexamethasone is what was causing the hiccups so he discontinued it; states he had about 4-5 pills left. States once he d/c'd the medication & the persistent hiccups subsided.   He c/o of food feeling "stuck" now, "like I need to burp, but once I burp I only get a little relief".  He also c/o of not moving his bowels like normal; denies constipation, but says "it's not normal".  He would like advice on what to do about this.

## 2020-07-01 NOTE — Telephone Encounter (Signed)
I can refer to GI if he likes to look into the reason for the dysphagia

## 2020-07-07 NOTE — Telephone Encounter (Signed)
Ok this referral is done 

## 2020-07-07 NOTE — Addendum Note (Signed)
Addended by: [name] on: 07/07/2020 01:34 PM   Modules accepted: Orders

## 2020-07-07 NOTE — Telephone Encounter (Signed)
Patient was notified that Dr. Jonny Ruiz would be doing the referral to GI; please update referral as offered.

## 2020-07-22 ENCOUNTER — Encounter: Payer: Self-pay | Admitting: Gastroenterology

## 2020-08-03 ENCOUNTER — Other Ambulatory Visit: Payer: Self-pay

## 2020-08-05 ENCOUNTER — Ambulatory Visit: Payer: Federal, State, Local not specified - PPO | Admitting: Physician Assistant

## 2020-08-05 ENCOUNTER — Ambulatory Visit (INDEPENDENT_AMBULATORY_CARE_PROVIDER_SITE_OTHER): Payer: Federal, State, Local not specified - PPO | Admitting: Internal Medicine

## 2020-08-05 ENCOUNTER — Other Ambulatory Visit: Payer: Self-pay

## 2020-08-05 ENCOUNTER — Ambulatory Visit: Payer: Federal, State, Local not specified - PPO | Admitting: Gastroenterology

## 2020-08-05 VITALS — BP 124/76 | HR 72 | Temp 98.3°F | Ht 69.0 in | Wt 379.0 lb

## 2020-08-05 DIAGNOSIS — Z1159 Encounter for screening for other viral diseases: Secondary | ICD-10-CM | POA: Diagnosis not present

## 2020-08-05 DIAGNOSIS — E1165 Type 2 diabetes mellitus with hyperglycemia: Secondary | ICD-10-CM

## 2020-08-05 DIAGNOSIS — I10 Essential (primary) hypertension: Secondary | ICD-10-CM

## 2020-08-05 DIAGNOSIS — E559 Vitamin D deficiency, unspecified: Secondary | ICD-10-CM | POA: Diagnosis not present

## 2020-08-05 DIAGNOSIS — Z0001 Encounter for general adult medical examination with abnormal findings: Secondary | ICD-10-CM

## 2020-08-05 DIAGNOSIS — Z0000 Encounter for general adult medical examination without abnormal findings: Secondary | ICD-10-CM

## 2020-08-05 DIAGNOSIS — E538 Deficiency of other specified B group vitamins: Secondary | ICD-10-CM

## 2020-08-05 LAB — CBC WITH DIFFERENTIAL/PLATELET
Basophils Absolute: 0.1 10*3/uL (ref 0.0–0.1)
Basophils Relative: 0.7 % (ref 0.0–3.0)
Eosinophils Absolute: 0.2 10*3/uL (ref 0.0–0.7)
Eosinophils Relative: 2.5 % (ref 0.0–5.0)
HCT: 40.4 % (ref 39.0–52.0)
Hemoglobin: 13.5 g/dL (ref 13.0–17.0)
Lymphocytes Relative: 30.4 % (ref 12.0–46.0)
Lymphs Abs: 2.8 10*3/uL (ref 0.7–4.0)
MCHC: 33.3 g/dL (ref 30.0–36.0)
MCV: 86.3 fl (ref 78.0–100.0)
Monocytes Absolute: 0.8 10*3/uL (ref 0.1–1.0)
Monocytes Relative: 8.7 % (ref 3.0–12.0)
Neutro Abs: 5.3 10*3/uL (ref 1.4–7.7)
Neutrophils Relative %: 57.7 % (ref 43.0–77.0)
Platelets: 213 10*3/uL (ref 150.0–400.0)
RBC: 4.68 Mil/uL (ref 4.22–5.81)
RDW: 13.6 % (ref 11.5–15.5)
WBC: 9.1 10*3/uL (ref 4.0–10.5)

## 2020-08-05 LAB — PSA: PSA: 0.66 ng/mL (ref 0.10–4.00)

## 2020-08-05 LAB — BASIC METABOLIC PANEL
BUN: 15 mg/dL (ref 6–23)
CO2: 25 mEq/L (ref 19–32)
Calcium: 9 mg/dL (ref 8.4–10.5)
Chloride: 100 mEq/L (ref 96–112)
Creatinine, Ser: 0.99 mg/dL (ref 0.40–1.50)
GFR: 91.25 mL/min (ref >60.00)
Glucose, Bld: 374 mg/dL — ABNORMAL HIGH (ref 70–99)
Potassium: 4.2 mEq/L (ref 3.5–5.1)
Sodium: 135 mEq/L (ref 135–145)

## 2020-08-05 LAB — HEPATIC FUNCTION PANEL
ALT: 27 U/L (ref 0–53)
AST: 15 U/L (ref 0–37)
Albumin: 4.2 g/dL (ref 3.5–5.2)
Alkaline Phosphatase: 102 U/L (ref 39–117)
Bilirubin, Direct: 0.1 mg/dL (ref 0.0–0.3)
Total Bilirubin: 0.4 mg/dL (ref 0.2–1.2)
Total Protein: 6.9 g/dL (ref 6.0–8.3)

## 2020-08-05 LAB — VITAMIN B12: Vitamin B-12: 590 pg/mL (ref 211–911)

## 2020-08-05 LAB — MICROALBUMIN / CREATININE URINE RATIO
Creatinine,U: 139.3 mg/dL
Microalb Creat Ratio: 0.5 mg/g (ref 0.0–30.0)
Microalb, Ur: <0.7 mg/dL (ref 0.0–1.9)

## 2020-08-05 LAB — LIPID PANEL
Cholesterol: 126 mg/dL (ref 0–200)
HDL: 33.2 mg/dL — ABNORMAL LOW (ref >39.00)
LDL Cholesterol: 68 mg/dL (ref 0–99)
NonHDL: 92.75
Total CHOL/HDL Ratio: 4
Triglycerides: 123 mg/dL (ref 0.0–149.0)
VLDL: 24.6 mg/dL (ref 0.0–40.0)

## 2020-08-05 LAB — TSH: TSH: 1.94 u[IU]/mL (ref 0.35–4.50)

## 2020-08-05 LAB — VITAMIN D 25 HYDROXY (VIT D DEFICIENCY, FRACTURES): VITD: 24.89 ng/mL — ABNORMAL LOW (ref 30.00–100.00)

## 2020-08-05 LAB — HEMOGLOBIN A1C: Hgb A1c MFr Bld: 15 % — ABNORMAL HIGH (ref 4.6–6.5)

## 2020-08-05 NOTE — Patient Instructions (Signed)
Ok to increase the VItamin D to 2000 units per day  Please continue all other medications as before, and refills have been done if requested.  Please have the pharmacy call with any other refills you may need.  Please continue your efforts at being more active, low cholesterol diet, and weight control.  You are otherwise up to date with prevention measures today.  Please keep your appointments with your specialists as you may have planned  You will be contacted regarding the referral for: colonoscopy  Please go to the LAB at the blood drawing area for the tests to be done  You will be contacted by phone if any changes need to be made immediately.  Otherwise, you will receive a letter about your results with an explanation, but please check with MyChart first.  Please remember to sign up for MyChart if you have not done so, as this will be important to you in the future with finding out test results, communicating by private email, and scheduling acute appointments online when needed.  Please make an Appointment to return in 6 months, or sooner if needed

## 2020-08-05 NOTE — Progress Notes (Signed)
Patient ID: Tyler [name], male   DOB: August 16, 1975, 45 y.o.   MRN: [id number]         Chief Complaint:: wellness exam and f/u VIt D and DM       HPI:  Tyler [name] is a 45 y.o. male here for wellness exam; due for hep C screen and colonoscopy                        Also f/u flu like illness in early dec 2021, then late dec 2021 with covid infection.  Did take the steroid tx.   Had hiccups now resolved, no longer feels need to see GI.  Not taking Vit D.   Pt denies chest pain, increased sob or doe, wheezing, orthopnea, PND, increased LE swelling, palpitations, dizziness or syncope.   Pt denies fever, night sweats, loss of appetite, or other constitutional symptoms.   Pt denies polydipsia, polyuria.  Denies new onset focal neuro s/s. 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   Wt Readings from Last 3 Encounters:  08/05/20 (!) 379 lb (171.9 kg)  01/20/20 (!) 397 lb 3.2 oz (180.2 kg)  04/22/19 (!) 396 lb (179.6 kg)   BP Readings from Last 3 Encounters:  08/05/20 124/76  01/20/20 120/70  04/22/19 118/70   Immunization History  Administered Date(s) Administered  . Influenza Whole 04/01/2009, 03/09/2010  . Influenza,inj,Quad PF,6+ Mos 04/27/2013, 02/11/2014, 06/09/2015, 04/05/2016, 07/18/2017, 06/22/2018, 04/06/2019  . PFIZER(Purple Top)SARS-COV-2 Vaccination 08/29/2019, 09/19/2019, 05/12/2020  . Pneumococcal Polysaccharide-23 10/21/2014  . Td 10/16/2008  . Tdap 08/28/2018   Health Maintenance Due  Topic Date Due  . COLONOSCOPY (Pts 45-57yrs Insurance coverage will need to be confirmed)  Never done      Past Medical History:  Diagnosis Date  . ACHILLES TENDINITIS, BILATERAL 10/16/2008  . ALLERGIC RHINITIS 08/13/2009  . CARDIOMYOPATHY 04/21/2009  . CHF (congestive heart failure) (HCC)   . CONGESTIVE HEART FAILURE 12/04/2007  . CORONARY ARTERY DISEASE 12/04/2007  . GERD (gastroesophageal reflux disease) 11/01/2011  .  HYPERLIPIDEMIA 03/12/2007  . Hypertension   . HYPERTENSION 03/12/2007  . OBESITY 04/21/2009  . Plantar fasciitis, bilateral 11/01/2011  . SLEEP APNEA 03/12/2007   Past Surgical History:  Procedure Laterality Date  . CARDIAC CATHETERIZATION N/A 12/19/2014   Procedure: Left Heart Cath and Coronary Angiography;  Surgeon: Lyn Records, MD;  Location: [facility] INVASIVE CV LAB;  Service: Cardiovascular;  Laterality: N/A;  . NO PAST SURGERIES      reports that he has never smoked. He has never used smokeless tobacco. He reports that he does not drink alcohol and does not use drugs. family history includes Asthma in his brother; Heart attack in his father and mother; Heart disease in his father and mother; Hypertension in an other family member. No Known Allergies Current Outpatient Medications on File Prior to Visit  Medication Sig Dispense Refill  . albuterol (PROVENTIL HFA;VENTOLIN HFA) 108 (90 BASE) MCG/ACT inhaler Inhale 2 puffs into the lungs every 6 (six) hours as needed for wheezing. 1 Inhaler 11  . amLODipine (NORVASC) 10 MG tablet Take 1 tablet by mouth once daily 90 tablet 0  . aspirin EC 81 MG tablet Take 81 mg by mouth daily.    Marland Kitchen atorvastatin (LIPITOR) 80 MG tablet Take 1 tablet by mouth once daily 90 tablet 0  . carvedilol (COREG) 25 MG tablet TAKE 2 TABLETS  BY MOUTH TWICE DAILY WITH A MEAL 120 tablet 0  . cetirizine (ZYRTEC) 10 MG tablet Take 10 mg by mouth daily.    Marland Kitchen dexamethasone (DECADRON) 6 MG tablet Take 1 tablet (6 mg total) by mouth 2 (two) times daily with a meal. 10 tablet 0  . Dextromethorphan-guaiFENesin 10-200 MG/5ML LIQD Take 1-2 tsp every 8 hours prn for cough 118 mL 0  . furosemide (LASIX) 20 MG tablet Take 1 tablet by mouth once daily 90 tablet 0  . lidocaine (LIDODERM) 5 % Place 1 patch onto the skin daily. Remove & Discard patch within 12 hours or as directed by MD 60 patch 1  . losartan (COZAAR) 100 MG tablet Take 1 tablet by mouth once daily 90 tablet 0  . meclizine  (ANTIVERT) 25 MG tablet Take 1 tablet (25 mg total) by mouth 3 (three) times daily as needed for dizziness. 30 tablet 0  . metFORMIN (GLUCOPHAGE) 500 MG tablet Take 1 tablet by mouth once daily with breakfast 90 tablet 0  . Multiple Vitamins-Minerals (MULTIVITAMIN WITH MINERALS) tablet Take 1 tablet by mouth daily.    . nitroGLYCERIN (NITROSTAT) 0.4 MG SL tablet Place 1 tablet (0.4 mg total) under the tongue every 5 (five) minutes as needed for chest pain. 25 tablet 3  . Omega-3 Fatty Acids (FISH OIL) 1200 MG CAPS Take 1,200 mg by mouth daily.    . ondansetron (ZOFRAN) 4 MG tablet Take 1 tablet (4 mg total) by mouth every 8 (eight) hours as needed for nausea or vomiting. 6 tablet 0  . pantoprazole (PROTONIX) 40 MG tablet TAKE ONE TABLET BY MOUTH ONCE DAILY 90 tablet 0  . PRESCRIPTION MEDICATION Inhale into the lungs at bedtime. CPAP    . spironolactone (ALDACTONE) 25 MG tablet Take 1 tablet by mouth once daily 90 tablet 3  . azithromycin (ZITHROMAX) 250 MG tablet 2 tabs po qd x 1 day; 1 tablet per day x 4 days; (Patient not taking: Reported on 08/05/2020) 6 tablet 0  . Cholecalciferol (VITAMIN D) 50 MCG (2000 UT) tablet Take 2,000 Units by mouth daily.    Marland Kitchen ezetimibe (ZETIA) 10 MG tablet Take 1 tablet (10 mg total) by mouth daily. 90 tablet 3  . SYMBICORT 160-4.5 MCG/ACT inhaler SMARTSIG:2 Puff(s) By Mouth Twice Daily     No current facility-administered medications on file prior to visit.        ROS:  All others reviewed and negative.  Objective        PE:  BP 124/76   Pulse 72   Temp 98.3 F (36.8 C) (Oral)   Ht 5\' 9"  (1.753 m)   Wt (!) 379 lb (171.9 kg)   SpO2 97%   BMI 55.97 kg/m                 Constitutional: Pt appears in NAD               HENT: Head: NCAT.                Right Ear: External ear normal.                 Left Ear: External ear normal.                Eyes: . Pupils are equal, round, and reactive to light. Conjunctivae and EOM are normal               Nose:  without d/c or deformity  Neck: Neck supple. Gross normal ROM               Cardiovascular: Normal rate and regular rhythm.                 Pulmonary/Chest: Effort normal and breath sounds without rales or wheezing.                Abd:  Soft, NT, ND, + BS, no organomegaly               Neurological: Pt is alert. At baseline orientation, motor grossly intact               Skin: Skin is warm. No rashes, no other new lesions, LE edema - trace bilat chronic               Psychiatric: Pt behavior is normal without agitation   Micro: none  Cardiac tracings I have personally interpreted today:  none  Pertinent Radiological findings (summarize): none   Lab Results  Component Value Date   WBC 9.1 08/05/2020   HGB 13.5 08/05/2020   HCT 40.4 08/05/2020   PLT 213.0 08/05/2020   GLUCOSE 374 (H) 08/05/2020   CHOL 126 08/05/2020   TRIG 123.0 08/05/2020   HDL 33.20 (L) 08/05/2020   LDLDIRECT 183.6 08/02/2011   LDLCALC 68 08/05/2020   ALT 27 08/05/2020   AST 15 08/05/2020   NA 135 08/05/2020   K 4.2 08/05/2020   CL 100 08/05/2020   CREATININE 0.99 08/05/2020   BUN 15 08/05/2020   CO2 25 08/05/2020   TSH 1.94 08/05/2020   PSA 0.66 08/05/2020   INR 1.2 (H) 12/15/2014   HGBA1C 15.0 (H) 08/05/2020   MICROALBUR <0.7 08/05/2020   Assessment/Plan:  Tyler [name] is a 45 y.o. Other or two or more races [6] Black or African American [2] male with  has a past medical history of ACHILLES TENDINITIS, BILATERAL (10/16/2008), ALLERGIC RHINITIS (08/13/2009), CARDIOMYOPATHY (04/21/2009), CHF (congestive heart failure) (HCC), CONGESTIVE HEART FAILURE (12/04/2007), CORONARY ARTERY DISEASE (12/04/2007), GERD (gastroesophageal reflux disease) (11/01/2011), HYPERLIPIDEMIA (03/12/2007), Hypertension, HYPERTENSION (03/12/2007), OBESITY (04/21/2009), Plantar fasciitis, bilateral (11/01/2011), and SLEEP APNEA (03/12/2007).  Encounter for well adult exam with abnormal findings Age and sex appropriate  education and counseling updated with regular exercise and diet Referrals for preventative services - for hep c screen, and colonoscopy referral Immunizations addressed - none needed Smoking counseling  - none needed Evidence for depression or other mood disorder - none significant Most recent labs reviewed. I have personally reviewed and have noted: 1) the patient's medical and social history 2) The patient's current medications and supplements 3) The patient's height, weight, and BMI have been recorded in the chart   Diabetes  Stable by hx, to check a1c,  pt to continue current medical treatment - metformin  Current Outpatient Medications (Endocrine & Metabolic):  .  dexamethasone (DECADRON) 6 MG tablet, Take 1 tablet (6 mg total) by mouth 2 (two) times daily with a meal. .  metFORMIN (GLUCOPHAGE) 500 MG tablet, Take 1 tablet by mouth once daily with breakfast  Current Outpatient Medications (Cardiovascular):  .  amLODipine (NORVASC) 10 MG tablet, Take 1 tablet by mouth once daily .  atorvastatin (LIPITOR) 80 MG tablet, Take 1 tablet by mouth once daily .  carvedilol (COREG) 25 MG tablet, TAKE 2 TABLETS BY MOUTH TWICE DAILY WITH A MEAL .  furosemide (LASIX) 20 MG tablet, Take 1 tablet by mouth once daily .  losartan (  COZAAR) 100 MG tablet, Take 1 tablet by mouth once daily .  nitroGLYCERIN (NITROSTAT) 0.4 MG SL tablet, Place 1 tablet (0.4 mg total) under the tongue every 5 (five) minutes as needed for chest pain. Marland Kitchen  spironolactone (ALDACTONE) 25 MG tablet, Take 1 tablet by mouth once daily .  ezetimibe (ZETIA) 10 MG tablet, Take 1 tablet (10 mg total) by mouth daily.  Current Outpatient Medications (Respiratory):  .  albuterol (PROVENTIL HFA;VENTOLIN HFA) 108 (90 BASE) MCG/ACT inhaler, Inhale 2 puffs into the lungs every 6 (six) hours as needed for wheezing. .  cetirizine (ZYRTEC) 10 MG tablet, Take 10 mg by mouth daily. Marland Kitchen  Dextromethorphan-guaiFENesin 10-200 MG/5ML LIQD, Take 1-2  tsp every 8 hours prn for cough .  SYMBICORT 160-4.5 MCG/ACT inhaler, SMARTSIG:2 Puff(s) By Mouth Twice Daily  Current Outpatient Medications (Analgesics):  .  aspirin EC 81 MG tablet, Take 81 mg by mouth daily.   Current Outpatient Medications (Other):  .  lidocaine (LIDODERM) 5 %, Place 1 patch onto the skin daily. Remove & Discard patch within 12 hours or as directed by MD .  meclizine (ANTIVERT) 25 MG tablet, Take 1 tablet (25 mg total) by mouth 3 (three) times daily as needed for dizziness. .  Multiple Vitamins-Minerals (MULTIVITAMIN WITH MINERALS) tablet, Take 1 tablet by mouth daily. .  Omega-3 Fatty Acids (FISH OIL) 1200 MG CAPS, Take 1,200 mg by mouth daily. .  ondansetron (ZOFRAN) 4 MG tablet, Take 1 tablet (4 mg total) by mouth every 8 (eight) hours as needed for nausea or vomiting. .  pantoprazole (PROTONIX) 40 MG tablet, TAKE ONE TABLET BY MOUTH ONCE DAILY .  PRESCRIPTION MEDICATION, Inhale into the lungs at bedtime. CPAP .  azithromycin (ZITHROMAX) 250 MG tablet, 2 tabs po qd x 1 day; 1 tablet per day x 4 days; (Patient not taking: Reported on 08/05/2020) .  Cholecalciferol (VITAMIN D) 50 MCG (2000 UT) tablet, Take 2,000 Units by mouth daily.   Essential hypertension BP Readings from Last 3 Encounters:  08/05/20 124/76  01/20/20 120/70  04/22/19 118/70   Stable, pt to continue medical treatment norvasc, coreg, lasix   Vitamin D deficiency Last vitamin D Lab Results  Component Value Date   VD25OH 24.89 (L) 08/05/2020   Low, to start oral replacement   Followup: Return in about 6 months (around 02/02/2021).  Oliver Barre, MD 08/09/2020 12:05 AM  [facility] - [facility] Medicine

## 2020-08-06 LAB — HEPATITIS C ANTIBODY
Hepatitis C Ab: NONREACTIVE
SIGNAL TO CUT-OFF: 0.02 (ref <1.00)

## 2020-08-06 LAB — URINALYSIS, ROUTINE W REFLEX MICROSCOPIC
Bilirubin Urine: NEGATIVE
Ketones, ur: NEGATIVE
Leukocytes,Ua: NEGATIVE
Nitrite: NEGATIVE
RBC / HPF: NONE SEEN (ref 0–?)
Specific Gravity, Urine: 1.02 (ref 1.000–1.030)
Total Protein, Urine: NEGATIVE
Urine Glucose: >=1000 — AB
Urobilinogen, UA: 0.2 (ref 0.0–1.0)
pH: 6 (ref 5.0–8.0)

## 2020-08-08 ENCOUNTER — Other Ambulatory Visit: Payer: Self-pay | Admitting: Cardiology

## 2020-08-08 NOTE — Progress Notes (Incomplete)
Patient ID: Tyler [name], male   DOB: 04/11/1976, 45 y.o.   MRN: [id number]         Chief Complaint:: wellness exam and f/u VIt D and DM       HPI:  Tyler [name] is a 45 y.o. male here for wellness exam; due for hep C screen and colonoscopy                        Also f/u flu like illness in early dec 2021, then late dec 2021 with covid infection.  Did take the steroid tx.   Had hiccups now resolved, no longer feels need to see GI.  Not taking Vit D.   Pt denies chest pain, increased sob or doe, wheezing, orthopnea, PND, increased LE swelling, palpitations, dizziness or syncope.   Pt denies fever, night sweats, loss of appetite, or other constitutional symptoms.   Pt denies polydipsia, polyuria.  Denies new onset focal neuro s/s. 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   Wt Readings from Last 3 Encounters:  08/05/20 (!) 379 lb (171.9 kg)  01/20/20 (!) 397 lb 3.2 oz (180.2 kg)  04/22/19 (!) 396 lb (179.6 kg)   BP Readings from Last 3 Encounters:  08/05/20 124/76  01/20/20 120/70  04/22/19 118/70   Immunization History  Administered Date(s) Administered  . Influenza Whole 04/01/2009, 03/09/2010  . Influenza,inj,Quad PF,6+ Mos 04/27/2013, 02/11/2014, 06/09/2015, 04/05/2016, 07/18/2017, 06/22/2018, 04/06/2019  . PFIZER(Purple Top)SARS-COV-2 Vaccination 08/29/2019, 09/19/2019, 05/12/2020  . Pneumococcal Polysaccharide-23 10/21/2014  . Td 10/16/2008  . Tdap 08/28/2018   Health Maintenance Due  Topic Date Due  . Hepatitis C Screening  Never done  . OPHTHALMOLOGY EXAM  Never done  . COLONOSCOPY (Pts 45-54yrs Insurance coverage will need to be confirmed)  Never done  . FOOT EXAM  06/23/2019  . HEMOGLOBIN A1C  06/28/2019  . INFLUENZA VACCINE  01/19/2020      Past Medical History:  Diagnosis Date  . ACHILLES TENDINITIS, BILATERAL 10/16/2008  . ALLERGIC RHINITIS 08/13/2009  . CARDIOMYOPATHY 04/21/2009  . CHF  (congestive heart failure) (HCC)   . CONGESTIVE HEART FAILURE 12/04/2007  . CORONARY ARTERY DISEASE 12/04/2007  . GERD (gastroesophageal reflux disease) 11/01/2011  . HYPERLIPIDEMIA 03/12/2007  . Hypertension   . HYPERTENSION 03/12/2007  . OBESITY 04/21/2009  . Plantar fasciitis, bilateral 11/01/2011  . SLEEP APNEA 03/12/2007   Past Surgical History:  Procedure Laterality Date  . CARDIAC CATHETERIZATION N/A 12/19/2014   Procedure: Left Heart Cath and Coronary Angiography;  Surgeon: Lyn Records, MD;  Location: [facility] INVASIVE CV LAB;  Service: Cardiovascular;  Laterality: N/A;  . NO PAST SURGERIES      reports that he has never smoked. He has never used smokeless tobacco. He reports that he does not drink alcohol and does not use drugs. family history includes Asthma in his brother; Heart attack in his father and mother; Heart disease in his father and mother; Hypertension in an other family member. No Known Allergies Current Outpatient Medications on File Prior to Visit  Medication Sig Dispense Refill  . albuterol (PROVENTIL HFA;VENTOLIN HFA) 108 (90 BASE) MCG/ACT inhaler Inhale 2 puffs into the lungs every 6 (six) hours as needed for wheezing. 1 Inhaler 11  . amLODipine (NORVASC) 10 MG tablet Take 1 tablet by mouth once daily 90 tablet 0  . aspirin EC 81 MG tablet Take 81  mg by mouth daily.    Marland Kitchen atorvastatin (LIPITOR) 80 MG tablet Take 1 tablet by mouth once daily 90 tablet 0  . carvedilol (COREG) 25 MG tablet TAKE 2 TABLETS BY MOUTH TWICE DAILY WITH A MEAL 120 tablet 0  . cetirizine (ZYRTEC) 10 MG tablet Take 10 mg by mouth daily.    Marland Kitchen dexamethasone (DECADRON) 6 MG tablet Take 1 tablet (6 mg total) by mouth 2 (two) times daily with a meal. 10 tablet 0  . Dextromethorphan-guaiFENesin 10-200 MG/5ML LIQD Take 1-2 tsp every 8 hours prn for cough 118 mL 0  . furosemide (LASIX) 20 MG tablet Take 1 tablet by mouth once daily 90 tablet 0  . lidocaine (LIDODERM) 5 % Place 1 patch onto the skin daily.  Remove & Discard patch within 12 hours or as directed by MD 60 patch 1  . losartan (COZAAR) 100 MG tablet Take 1 tablet by mouth once daily 90 tablet 0  . meclizine (ANTIVERT) 25 MG tablet Take 1 tablet (25 mg total) by mouth 3 (three) times daily as needed for dizziness. 30 tablet 0  . metFORMIN (GLUCOPHAGE) 500 MG tablet Take 1 tablet by mouth once daily with breakfast 90 tablet 0  . Multiple Vitamins-Minerals (MULTIVITAMIN WITH MINERALS) tablet Take 1 tablet by mouth daily.    . nitroGLYCERIN (NITROSTAT) 0.4 MG SL tablet Place 1 tablet (0.4 mg total) under the tongue every 5 (five) minutes as needed for chest pain. 25 tablet 3  . Omega-3 Fatty Acids (FISH OIL) 1200 MG CAPS Take 1,200 mg by mouth daily.    . ondansetron (ZOFRAN) 4 MG tablet Take 1 tablet (4 mg total) by mouth every 8 (eight) hours as needed for nausea or vomiting. 6 tablet 0  . pantoprazole (PROTONIX) 40 MG tablet TAKE ONE TABLET BY MOUTH ONCE DAILY 90 tablet 0  . PRESCRIPTION MEDICATION Inhale into the lungs at bedtime. CPAP    . spironolactone (ALDACTONE) 25 MG tablet Take 1 tablet by mouth once daily 90 tablet 3  . azithromycin (ZITHROMAX) 250 MG tablet 2 tabs po qd x 1 day; 1 tablet per day x 4 days; (Patient not taking: Reported on 08/05/2020) 6 tablet 0  . Cholecalciferol (VITAMIN D) 50 MCG (2000 UT) tablet Take 2,000 Units by mouth daily.    Marland Kitchen ezetimibe (ZETIA) 10 MG tablet Take 1 tablet (10 mg total) by mouth daily. 90 tablet 3  . SYMBICORT 160-4.5 MCG/ACT inhaler SMARTSIG:2 Puff(s) By Mouth Twice Daily     No current facility-administered medications on file prior to visit.        ROS:  All others reviewed and negative.  Objective        PE:  BP 124/76   Pulse 72   Temp 98.3 F (36.8 C) (Oral)   Ht 5\' 9"  (1.753 m)   Wt (!) 379 lb (171.9 kg)   SpO2 97%   BMI 55.97 kg/m                 Constitutional: Pt appears in NAD               HENT: Head: NCAT.                Right Ear: External ear normal.                  Left Ear: External ear normal.                Eyes: . Pupils  are equal, round, and reactive to light. Conjunctivae and EOM are normal               Nose: without d/c or deformity               Neck: Neck supple. Gross normal ROM               Cardiovascular: Normal rate and regular rhythm.                 Pulmonary/Chest: Effort normal and breath sounds without rales or wheezing.                Abd:  Soft, NT, ND, + BS, no organomegaly               Neurological: Pt is alert. At baseline orientation, motor grossly intact               Skin: Skin is warm. No rashes, no other new lesions, LE edema - trace bilat chronic               Psychiatric: Pt behavior is normal without agitation   Micro: none  Cardiac tracings I have personally interpreted today:  none  Pertinent Radiological findings (summarize): none   Lab Results  Component Value Date   WBC 8.2 06/22/2018   HGB 14.0 06/22/2018   HCT 41.7 06/22/2018   PLT 220.0 06/22/2018   GLUCOSE 117 (H) 05/01/2019   CHOL 142 06/12/2019   TRIG 85 06/12/2019   HDL 39 (L) 06/12/2019   LDLDIRECT 183.6 08/02/2011   LDLCALC 87 06/12/2019   ALT 39 06/12/2019   AST 19 06/12/2019   NA 139 05/01/2019   K 4.4 05/01/2019   CL 105 05/01/2019   CREATININE 0.94 05/01/2019   BUN 16 05/01/2019   CO2 21 05/01/2019   TSH 2.58 06/22/2018   PSA 0.71 06/22/2018   INR 1.2 (H) 12/15/2014   HGBA1C 6.8 (A) 12/26/2018   MICROALBUR <0.7 06/22/2018   Assessment/Plan:  Tyler [name] is a 45 y.o. Other or two or more races [6] Black or African American [2] male with  has a past medical history of ACHILLES TENDINITIS, BILATERAL (10/16/2008), ALLERGIC RHINITIS (08/13/2009), CARDIOMYOPATHY (04/21/2009), CHF (congestive heart failure) (HCC), CONGESTIVE HEART FAILURE (12/04/2007), CORONARY ARTERY DISEASE (12/04/2007), GERD (gastroesophageal reflux disease) (11/01/2011), HYPERLIPIDEMIA (03/12/2007), Hypertension, HYPERTENSION (03/12/2007), OBESITY (04/21/2009),  Plantar fasciitis, bilateral (11/01/2011), and SLEEP APNEA (03/12/2007).  No problem-specific Assessment & Plan notes found for this encounter.  Followup: No follow-ups on file.  Oliver Barre, MD 08/05/2020 2:59 PM [facility]

## 2020-08-09 ENCOUNTER — Encounter: Payer: Self-pay | Admitting: Internal Medicine

## 2020-08-09 NOTE — Assessment & Plan Note (Signed)
BP Readings from Last 3 Encounters:  08/05/20 124/76  01/20/20 120/70  04/22/19 118/70   Stable, pt to continue medical treatment norvasc, coreg, lasix

## 2020-08-09 NOTE — Assessment & Plan Note (Signed)
  Stable by hx, to check a1c,  pt to continue current medical treatment - metformin  Current Outpatient Medications (Endocrine & Metabolic):  .  dexamethasone (DECADRON) 6 MG tablet, Take 1 tablet (6 mg total) by mouth 2 (two) times daily with a meal. .  metFORMIN (GLUCOPHAGE) 500 MG tablet, Take 1 tablet by mouth once daily with breakfast  Current Outpatient Medications (Cardiovascular):  .  amLODipine (NORVASC) 10 MG tablet, Take 1 tablet by mouth once daily .  atorvastatin (LIPITOR) 80 MG tablet, Take 1 tablet by mouth once daily .  carvedilol (COREG) 25 MG tablet, TAKE 2 TABLETS BY MOUTH TWICE DAILY WITH A MEAL .  furosemide (LASIX) 20 MG tablet, Take 1 tablet by mouth once daily .  losartan (COZAAR) 100 MG tablet, Take 1 tablet by mouth once daily .  nitroGLYCERIN (NITROSTAT) 0.4 MG SL tablet, Place 1 tablet (0.4 mg total) under the tongue every 5 (five) minutes as needed for chest pain. Marland Kitchen  spironolactone (ALDACTONE) 25 MG tablet, Take 1 tablet by mouth once daily .  ezetimibe (ZETIA) 10 MG tablet, Take 1 tablet (10 mg total) by mouth daily.  Current Outpatient Medications (Respiratory):  .  albuterol (PROVENTIL HFA;VENTOLIN HFA) 108 (90 BASE) MCG/ACT inhaler, Inhale 2 puffs into the lungs every 6 (six) hours as needed for wheezing. .  cetirizine (ZYRTEC) 10 MG tablet, Take 10 mg by mouth daily. Marland Kitchen  Dextromethorphan-guaiFENesin 10-200 MG/5ML LIQD, Take 1-2 tsp every 8 hours prn for cough .  SYMBICORT 160-4.5 MCG/ACT inhaler, SMARTSIG:2 Puff(s) By Mouth Twice Daily  Current Outpatient Medications (Analgesics):  .  aspirin EC 81 MG tablet, Take 81 mg by mouth daily.   Current Outpatient Medications (Other):  .  lidocaine (LIDODERM) 5 %, Place 1 patch onto the skin daily. Remove & Discard patch within 12 hours or as directed by MD .  meclizine (ANTIVERT) 25 MG tablet, Take 1 tablet (25 mg total) by mouth 3 (three) times daily as needed for dizziness. .  Multiple Vitamins-Minerals  (MULTIVITAMIN WITH MINERALS) tablet, Take 1 tablet by mouth daily. .  Omega-3 Fatty Acids (FISH OIL) 1200 MG CAPS, Take 1,200 mg by mouth daily. .  ondansetron (ZOFRAN) 4 MG tablet, Take 1 tablet (4 mg total) by mouth every 8 (eight) hours as needed for nausea or vomiting. .  pantoprazole (PROTONIX) 40 MG tablet, TAKE ONE TABLET BY MOUTH ONCE DAILY .  PRESCRIPTION MEDICATION, Inhale into the lungs at bedtime. CPAP .  azithromycin (ZITHROMAX) 250 MG tablet, 2 tabs po qd x 1 day; 1 tablet per day x 4 days; (Patient not taking: Reported on 08/05/2020) .  Cholecalciferol (VITAMIN D) 50 MCG (2000 UT) tablet, Take 2,000 Units by mouth daily.

## 2020-08-09 NOTE — Assessment & Plan Note (Signed)
Age and sex appropriate education and counseling updated with regular exercise and diet Referrals for preventative services - for hep c screen, and colonoscopy referral Immunizations addressed - none needed Smoking counseling  - none needed Evidence for depression or other mood disorder - none significant Most recent labs reviewed. I have personally reviewed and have noted: 1) the patient's medical and social history 2) The patient's current medications and supplements 3) The patient's height, weight, and BMI have been recorded in the chart

## 2020-08-09 NOTE — Assessment & Plan Note (Signed)
Last vitamin D Lab Results  Component Value Date   VD25OH 24.89 (L) 08/05/2020   Low, to start oral replacement

## 2020-08-11 ENCOUNTER — Encounter: Payer: Self-pay | Admitting: Internal Medicine

## 2020-08-11 ENCOUNTER — Other Ambulatory Visit: Payer: Self-pay | Admitting: Internal Medicine

## 2020-08-11 DIAGNOSIS — E1165 Type 2 diabetes mellitus with hyperglycemia: Secondary | ICD-10-CM

## 2020-08-11 MED ORDER — GLIPIZIDE ER 10 MG PO TB24: 10.0000 mg | ORAL_TABLET | Freq: Every day | ORAL | 3 refills | Status: DC

## 2020-08-11 MED ORDER — METFORMIN HCL 500 MG PO TABS: 2000.0000 mg | ORAL_TABLET | Freq: Every day | ORAL | 3 refills | Status: DC

## 2020-08-18 NOTE — Telephone Encounter (Signed)
Patient calling back after speaking with his insurance and he stated the freestyle libre 2 is covered He would like it to go to the [place] [facility] outpatient pharmacy

## 2020-08-19 ENCOUNTER — Other Ambulatory Visit: Payer: Self-pay | Admitting: Internal Medicine

## 2020-08-19 MED ORDER — FREESTYLE LIBRE 2 READER DEVI: 0 refills | Status: DC

## 2020-08-19 MED ORDER — FREESTYLE LIBRE 2 SENSOR MISC: 3 refills | Status: DC

## 2020-08-19 NOTE — Telephone Encounter (Signed)
Ok this is done 

## 2020-09-05 NOTE — Progress Notes (Signed)
HPI: FU hypertension and nonischemic cardiomyopathy. Patient had a previous nonischemic cardiomyopathy which improved on fu echos. However he was seen forrecurrentchest pain. Nuclear study June 2016 showed an ejection fraction of 37%. There is an old inferior/inferior lateral infarct with peri-infarct ischemia. Cardiac catheterization July 2016 showed a 90% first posterior lateral branch, 90% distal LAD and 50% proximal RCA. Medical therapy recommended. Most recent echocardiogram November 2020 showed ejection fraction 45 to 50%, moderate left ventricular hypertrophy, grade 1 diastolic dysfunction and trace aortic insufficiency.  Since last seen,the patient denies any dyspnea on exertion, orthopnea, PND, pedal edema, palpitations, syncope or chest pain.   Current Outpatient Medications  Medication Sig Dispense Refill  . albuterol (PROVENTIL HFA;VENTOLIN HFA) 108 (90 BASE) MCG/ACT inhaler Inhale 2 puffs into the lungs every 6 (six) hours as needed for wheezing. 1 Inhaler 11  . amLODipine (NORVASC) 10 MG tablet Take 1 tablet by mouth once daily 90 tablet 0  . aspirin EC 81 MG tablet Take 81 mg by mouth daily.    Marland Kitchen atorvastatin (LIPITOR) 80 MG tablet Take 1 tablet by mouth once daily 90 tablet 0  . azithromycin (ZITHROMAX) 250 MG tablet 2 tabs po qd x 1 day; 1 tablet per day x 4 days; 6 tablet 0  . carvedilol (COREG) 25 MG tablet TAKE 2 TABLETS BY MOUTH TWICE DAILY WITH A MEAL 120 tablet 1  . cetirizine (ZYRTEC) 10 MG tablet Take 10 mg by mouth daily.    . Cholecalciferol (VITAMIN D) 50 MCG (2000 UT) tablet Take 2,000 Units by mouth daily.    . Continuous Blood Gluc Receiver (FREESTYLE LIBRE 2 READER) DEVI Use as directed once daily  E11.9 1 each 0  . Continuous Blood Gluc Sensor (FREESTYLE LIBRE 2 SENSOR) MISC Use as directed every 14 days E11.9 6 each 3  . ezetimibe (ZETIA) 10 MG tablet Take 1 tablet by mouth once daily 90 tablet 0  . furosemide (LASIX) 20 MG tablet Take 1 tablet by mouth  once daily 90 tablet 0  . glipiZIDE (GLUCOTROL XL) 10 MG 24 hr tablet Take 1 tablet (10 mg total) by mouth daily with breakfast. 90 tablet 3  . losartan (COZAAR) 100 MG tablet Take 1 tablet by mouth once daily 90 tablet 0  . meclizine (ANTIVERT) 25 MG tablet Take 1 tablet (25 mg total) by mouth 3 (three) times daily as needed for dizziness. 30 tablet 0  . metFORMIN (GLUCOPHAGE) 500 MG tablet Take 4 tablets (2,000 mg total) by mouth daily with breakfast. 360 tablet 3  . Multiple Vitamins-Minerals (MULTIVITAMIN WITH MINERALS) tablet Take 1 tablet by mouth daily.    . nitroGLYCERIN (NITROSTAT) 0.4 MG SL tablet Place 1 tablet (0.4 mg total) under the tongue every 5 (five) minutes as needed for chest pain. 25 tablet 3  . PRESCRIPTION MEDICATION Inhale into the lungs at bedtime. CPAP    . spironolactone (ALDACTONE) 25 MG tablet Take 1 tablet by mouth once daily 90 tablet 3  . SYMBICORT 160-4.5 MCG/ACT inhaler SMARTSIG:2 Puff(s) By Mouth Twice Daily     No current facility-administered medications for this visit.     Past Medical History:  Diagnosis Date  . ACHILLES TENDINITIS, BILATERAL 10/16/2008  . ALLERGIC RHINITIS 08/13/2009  . CARDIOMYOPATHY 04/21/2009  . CHF (congestive heart failure) (HCC)   . CONGESTIVE HEART FAILURE 12/04/2007  . CORONARY ARTERY DISEASE 12/04/2007  . GERD (gastroesophageal reflux disease) 11/01/2011  . HYPERLIPIDEMIA 03/12/2007  . Hypertension   . HYPERTENSION  03/12/2007  . OBESITY 04/21/2009  . Plantar fasciitis, bilateral 11/01/2011  . SLEEP APNEA 03/12/2007    Past Surgical History:  Procedure Laterality Date  . CARDIAC CATHETERIZATION N/A 12/19/2014   Procedure: Left Heart Cath and Coronary Angiography;  Surgeon: Lyn Records, MD;  Location: [facility] INVASIVE CV LAB;  Service: Cardiovascular;  Laterality: N/A;  . NO PAST SURGERIES      Social History   Socioeconomic History  . Marital status: Married    Spouse name: Not on file  . Number of children: 2  . Years of  education: Not on file  . Highest education level: Not on file  Occupational History  . Occupation: Engineer, maintenance OF [place]    Employer: [employer]  Tobacco Use  . Smoking status: Never Smoker  . Smokeless tobacco: Never Used  Substance and Sexual Activity  . Alcohol use: No  . Drug use: No  . Sexual activity: Not on file  Other Topics Concern  . Not on file  Social History Narrative  . Not on file   Social Determinants of Health   Financial Resource Strain: Not on file  Food Insecurity: Not on file  Transportation Needs: Not on file  Physical Activity: Not on file  Stress: Not on file  Social Connections: Not on file  Intimate Partner Violence: Not on file    Family History  Problem Relation Age of Onset  . Hypertension Other   . Heart attack Mother   . Heart disease Mother   . Heart attack Father   . Heart disease Father   . Asthma Brother     ROS: no fevers or chills, productive cough, hemoptysis, dysphasia, odynophagia, melena, hematochezia, dysuria, hematuria, rash, seizure activity, orthopnea, PND, pedal edema, claudication. Remaining systems are negative.  Physical Exam: Well-developed obese in no acute distress.  Skin is warm and dry.  HEENT is normal.  Neck is supple.  Chest is clear to auscultation with normal expansion.  Cardiovascular exam is regular rate and rhythm.  Abdominal exam nontender or distended. No masses palpated. Extremities show no edema. neuro grossly intact  ECG-normal sinus rhythm at a rate of 71, septal infarct versus lead reversal.  Personally reviewed  A/P  1 nonischemic cardiomyopathy-LV function mildly reduced on most recent echocardiogram.  We will continue beta-blocker.  Discontinue losartan and treat with Entresto 49/51 twice daily.  Titrate as tolerated by blood pressure.  Check potassium and renal function in 1 week.  2 hypertension-patient's blood pressure is controlled.  Continue present  medications other than changing losartan to [facility] as outlined above.  3 coronary artery disease-continue aspirin and statin.  4 hyperlipidemia-continue statin.  5 chronic combined systolic/diastolic congestive heart failure-patient appears to be euvolemic.  Continue Lasix at present dose.  6 morbid obesity-we discussed the importance of weight loss.  Olga Millers, MD

## 2020-09-07 ENCOUNTER — Ambulatory Visit (INDEPENDENT_AMBULATORY_CARE_PROVIDER_SITE_OTHER): Payer: Federal, State, Local not specified - PPO | Admitting: Cardiology

## 2020-09-07 ENCOUNTER — Other Ambulatory Visit: Payer: Self-pay | Admitting: Cardiology

## 2020-09-07 ENCOUNTER — Other Ambulatory Visit: Payer: Self-pay

## 2020-09-07 ENCOUNTER — Encounter: Payer: Self-pay | Admitting: Cardiology

## 2020-09-07 VITALS — BP 118/76 | HR 71 | Ht 69.0 in | Wt 381.6 lb

## 2020-09-07 DIAGNOSIS — I428 Other cardiomyopathies: Secondary | ICD-10-CM | POA: Diagnosis not present

## 2020-09-07 DIAGNOSIS — I2510 Atherosclerotic heart disease of native coronary artery without angina pectoris: Secondary | ICD-10-CM

## 2020-09-07 DIAGNOSIS — E7800 Pure hypercholesterolemia, unspecified: Secondary | ICD-10-CM

## 2020-09-07 DIAGNOSIS — I10 Essential (primary) hypertension: Secondary | ICD-10-CM | POA: Diagnosis not present

## 2020-09-07 MED ORDER — SACUBITRIL-VALSARTAN 49-51 MG PO TABS: 1.0000 | ORAL_TABLET | Freq: Two times a day (BID) | ORAL | 11 refills | Status: DC

## 2020-09-07 NOTE — Addendum Note (Signed)
Addended by: Freddi Starr on: 09/07/2020 04:42 PM   Modules accepted: Orders

## 2020-09-07 NOTE — Patient Instructions (Signed)
Medication Instructions:   STOP LOSARTAN  START ENTRESTO 49/51 MG ONE TABLET TWICE DAILY  *If you need a refill on your cardiac medications before your next appointment, please call your pharmacy*   Lab Work:  Your physician recommends that you return for lab work in: ONE WEEK   If you have labs (blood work) drawn today and your tests are completely normal, you will receive your results only by: Marland Kitchen MyChart Message (if you have MyChart) OR . A paper copy in the mail If you have any lab test that is abnormal or we need to change your treatment, we will call you to review the results.   Follow-Up: At [facility], you and your health needs are our priority.  As part of our continuing mission to provide you with exceptional heart care, we have created designated Provider Care Teams.  These Care Teams include your primary Cardiologist (physician) and Advanced Practice Providers (APPs -  Physician Assistants and Nurse Practitioners) who all work together to provide you with the care you need, when you need it.  We recommend signing up for the patient portal called "MyChart".  Sign up information is provided on this After Visit Summary.  MyChart is used to connect with patients for Virtual Visits (Telemedicine).  Patients are able to view lab/test results, encounter notes, upcoming appointments, etc.  Non-urgent messages can be sent to your provider as well.   To learn more about what you can do with MyChart, go to [email or web address].    Your next appointment:   12 month(s)  The format for your next appointment:   In Person  Provider:   Olga Millers, MD

## 2020-09-08 ENCOUNTER — Encounter: Payer: Self-pay | Admitting: Internal Medicine

## 2020-09-08 ENCOUNTER — Telehealth: Payer: Self-pay | Admitting: Cardiology

## 2020-09-08 NOTE — Telephone Encounter (Signed)
Pt c/o medication issue:  1. Name of Medication: sacubitril-valsartan (ENTRESTO) 49-51 MG  2. How are you currently taking this medication (dosage and times per day)? Patient has not started taking this medication  3. Are you having a reaction (difficulty breathing--STAT)? No   4. What is your medication issue?   Patient states his insurance is requesting a prior authorization from Dr. Jens Som prior to covering this medication. Please assist.

## 2020-09-08 NOTE — Telephone Encounter (Signed)
Spoke to patient advised I will send message to Dr.Crenshaw's RN.

## 2020-09-09 NOTE — Telephone Encounter (Signed)
Left message for pt, PA complete and approved.

## 2020-09-14 ENCOUNTER — Other Ambulatory Visit: Payer: Self-pay | Admitting: Cardiology

## 2020-09-14 DIAGNOSIS — I429 Cardiomyopathy, unspecified: Secondary | ICD-10-CM

## 2020-09-14 NOTE — Telephone Encounter (Signed)
Error

## 2020-09-17 NOTE — Telephone Encounter (Signed)
Ok to ask pt to find out which meter is covered by his insurance, and we can send that one in, o/w we would be just guessing

## 2020-09-17 NOTE — Telephone Encounter (Signed)
Patient called to check on the status of the PA for the [facility]. He can be reached at [phone] anytime after 2pm.

## 2020-09-21 ENCOUNTER — Telehealth: Payer: Self-pay | Admitting: Internal Medicine

## 2020-09-21 ENCOUNTER — Other Ambulatory Visit (HOSPITAL_COMMUNITY): Payer: Self-pay

## 2020-09-21 MED FILL — Continuous Glucose System Receiver: 30 days supply | Qty: 1 | Fill #0 | Status: CN

## 2020-09-21 NOTE — Telephone Encounter (Signed)
Patient has been notified of the insurance denial and to follow up with what is covered.

## 2020-09-21 NOTE — Telephone Encounter (Signed)
Continuous Blood Gluc Receiver (FREESTYLE LIBRE 2 READER) DEVI Continuous Blood Gluc Sensor (FREESTYLE LIBRE 2 SENSOR) MISC Patient states the pharmacy said he needed a prior auth for these

## 2020-09-23 ENCOUNTER — Other Ambulatory Visit: Payer: Self-pay

## 2020-09-23 ENCOUNTER — Ambulatory Visit: Payer: Federal, State, Local not specified - PPO | Admitting: Internal Medicine

## 2020-09-23 ENCOUNTER — Encounter: Payer: Self-pay | Admitting: Internal Medicine

## 2020-09-23 VITALS — BP 122/76 | HR 65 | Ht 69.0 in | Wt 386.0 lb

## 2020-09-23 DIAGNOSIS — E559 Vitamin D deficiency, unspecified: Secondary | ICD-10-CM

## 2020-09-23 DIAGNOSIS — I10 Essential (primary) hypertension: Secondary | ICD-10-CM | POA: Diagnosis not present

## 2020-09-23 DIAGNOSIS — E1165 Type 2 diabetes mellitus with hyperglycemia: Secondary | ICD-10-CM | POA: Diagnosis not present

## 2020-09-23 DIAGNOSIS — E785 Hyperlipidemia, unspecified: Secondary | ICD-10-CM

## 2020-09-23 DIAGNOSIS — E538 Deficiency of other specified B group vitamins: Secondary | ICD-10-CM

## 2020-09-23 MED ORDER — FREESTYLE LIBRE 2 SENSOR MISC: 3 refills | Status: AC

## 2020-09-23 MED ORDER — ONETOUCH ULTRA 2 W/DEVICE KIT: PACK | 0 refills | Status: DC

## 2020-09-23 MED ORDER — FREESTYLE LIBRE 2 READER DEVI: Freq: Every day | 0 refills | Status: AC

## 2020-09-23 MED ORDER — ONETOUCH ULTRA VI STRP: ORAL_STRIP | 12 refills | Status: DC

## 2020-09-23 MED ORDER — LANCETS MISC: 12 refills | Status: AC

## 2020-09-23 NOTE — Progress Notes (Signed)
Patient ID: Tyler [name], male   DOB: 1975/08/05, 45 y.o.   MRN: [id number]        Chief Complaint: follow up HTN, HLD, DM and low vit d       HPI:  Tyler [name] is a 45 y.o. male here not yet started Vit D.   Pt denies polydipsia, polyuria, Pt denies chest pain, increased sob or doe, wheezing, orthopnea, PND, increased LE swelling, palpitations, dizziness or syncope.  Denies new neuro focal s/s.       Libre and Dexcom sensors are not covered with insurance.  Has not been to DM nutrition yet but plans to attend.   Pt denies fever, wt loss, night sweats, loss of appetite, or other constitutional symptoms, in fact wt has increased several lbs recently.  Wt Readings from Last 3 Encounters:  09/25/20 (!) 384 lb 3.2 oz (174.3 kg)  09/23/20 (!) 386 lb (175.1 kg)  09/07/20 (!) 381 lb 9.6 oz (173.1 kg)   BP Readings from Last 3 Encounters:  09/25/20 120/80  09/23/20 122/76  09/07/20 118/76         Past Medical History:  Diagnosis Date  . ACHILLES TENDINITIS, BILATERAL 10/16/2008  . ALLERGIC RHINITIS 08/13/2009  . CARDIOMYOPATHY 04/21/2009  . CHF (congestive heart failure) (HCC)   . CONGESTIVE HEART FAILURE 12/04/2007  . CORONARY ARTERY DISEASE 12/04/2007  . GERD (gastroesophageal reflux disease) 11/01/2011  . HYPERLIPIDEMIA 03/12/2007  . Hypertension   . HYPERTENSION 03/12/2007  . OBESITY 04/21/2009  . Plantar fasciitis, bilateral 11/01/2011  . SLEEP APNEA 03/12/2007   Past Surgical History:  Procedure Laterality Date  . CARDIAC CATHETERIZATION N/A 12/19/2014   Procedure: Left Heart Cath and Coronary Angiography;  Surgeon: Lyn Records, MD;  Location: [facility] INVASIVE CV LAB;  Service: Cardiovascular;  Laterality: N/A;  . NO PAST SURGERIES      reports that he has never smoked. He has never used smokeless tobacco. He reports that he does not drink alcohol and does not use drugs. family history includes Asthma in his brother; Diabetes in his father and mother; Heart attack in his father and  mother; Heart disease in his father and mother; Hypertension in an other family member. No Known Allergies Current Outpatient Medications on File Prior to Visit  Medication Sig Dispense Refill  . albuterol (PROVENTIL HFA;VENTOLIN HFA) 108 (90 BASE) MCG/ACT inhaler Inhale 2 puffs into the lungs every 6 (six) hours as needed for wheezing. 1 Inhaler 11  . amLODipine (NORVASC) 10 MG tablet Take 1 tablet by mouth once daily 90 tablet 0  . aspirin EC 81 MG tablet Take 81 mg by mouth daily.    Marland Kitchen atorvastatin (LIPITOR) 80 MG tablet Take 1 tablet by mouth once daily 90 tablet 0  . Baloxavir Marboxil,80 MG Dose, 1 x 80 MG TBPK TAKE 1 TABLET BY MOUTH AS DIRECTED PER INSTRUCTIONS IN PACKAGE 1 each 0  . budesonide-formoterol (SYMBICORT) 160-4.5 MCG/ACT inhaler INHALE 2 PUFFS BY MOUTH TWICE A DAY 10.2 g 0  . carvedilol (COREG) 25 MG tablet TAKE 2 TABLETS BY MOUTH TWICE DAILY WITH A MEAL 120 tablet 1  . cetirizine (ZYRTEC) 10 MG tablet Take 10 mg by mouth daily.    . Cholecalciferol (VITAMIN D) 50 MCG (2000 UT) tablet Take 2,000 Units by mouth daily.    Marland Kitchen ezetimibe (ZETIA) 10 MG tablet Take 1 tablet by mouth once daily 90 tablet 0  . furosemide (LASIX) 20 MG tablet Take 1 tablet by mouth  once daily 90 tablet 0  . glipiZIDE (GLUCOTROL XL) 10 MG 24 hr tablet Take 1 tablet (10 mg total) by mouth daily with breakfast. 90 tablet 3  . metFORMIN (GLUCOPHAGE) 500 MG tablet Take 4 tablets (2,000 mg total) by mouth daily with breakfast. 360 tablet 3  . Multiple Vitamins-Minerals (MULTIVITAMIN WITH MINERALS) tablet Take 1 tablet by mouth daily.    . nitroGLYCERIN (NITROSTAT) 0.4 MG SL tablet Place 1 tablet (0.4 mg total) under the tongue every 5 (five) minutes as needed for chest pain. 25 tablet 3  . PRESCRIPTION MEDICATION Inhale into the lungs at bedtime. CPAP    . sacubitril-valsartan (ENTRESTO) 49-51 MG TAKE 1 TABLET BY MOUTH TWICE DAILY 60 tablet 11  . spironolactone (ALDACTONE) 25 MG tablet Take 1 tablet by mouth  once daily 90 tablet 3  . SYMBICORT 160-4.5 MCG/ACT inhaler SMARTSIG:2 Puff(s) By Mouth Twice Daily    . azithromycin (ZITHROMAX) 250 MG tablet TAKE 2 TABLETS BY MOUTH ON DAY 1 THEN TAKE 1 TABLET DAILY FOR THE NEXT 4 DAYS (Patient taking differently: Take by mouth daily. for the next 4 days) 6 tablet 0  . azithromycin (ZITHROMAX) 250 MG tablet TAKE 2 TABLETS BY MOUTH ON DAY 1 THEN TAKE 1 TABLET DAILY FOR THE NEXT 4 DAYS (Patient taking differently: Take by mouth daily. for the next 4 days) 6 tablet 0  . benzonatate (TESSALON) 100 MG capsule TAKE 1 CAPSULE BY MOUTH 3 TIMES DAILY AS NEEDED FOR COUGH. 21 capsule 0  . meclizine (ANTIVERT) 25 MG tablet Take 1 tablet (25 mg total) by mouth 3 (three) times daily as needed for dizziness. 30 tablet 0   No current facility-administered medications on file prior to visit.        ROS:  All others reviewed and negative.  Objective        PE:  BP 122/76 (BP Location: Left Arm, Patient Position: Sitting, Cuff Size: Large)   Pulse 65   Ht 5\' 9"  (1.753 m)   Wt (!) 386 lb (175.1 kg)   SpO2 96%   BMI 57.00 kg/m                 Constitutional: Pt appears in NAD               HENT: Head: NCAT.                Right Ear: External ear normal.                 Left Ear: External ear normal.                Eyes: . Pupils are equal, round, and reactive to light. Conjunctivae and EOM are normal               Nose: without d/c or deformity               Neck: Neck supple. Gross normal ROM               Cardiovascular: Normal rate and regular rhythm.                 Pulmonary/Chest: Effort normal and breath sounds without rales or wheezing.                Abd:  Soft, NT, ND, + BS, no organomegaly               Neurological: Pt is alert. At baseline orientation, motor  grossly intact               Skin: Skin is warm. No rashes, no other new lesions, LE edema - trace bilat               Psychiatric: Pt behavior is normal without agitation   Micro: none  Cardiac  tracings I have personally interpreted today:  none  Pertinent Radiological findings (summarize): none   Lab Results  Component Value Date   WBC 9.1 08/05/2020   HGB 13.5 08/05/2020   HCT 40.4 08/05/2020   PLT 213.0 08/05/2020   GLUCOSE 374 (H) 08/05/2020   CHOL 126 08/05/2020   TRIG 123.0 08/05/2020   HDL 33.20 (L) 08/05/2020   LDLDIRECT 183.6 08/02/2011   LDLCALC 68 08/05/2020   ALT 27 08/05/2020   AST 15 08/05/2020   NA 135 08/05/2020   K 4.2 08/05/2020   CL 100 08/05/2020   CREATININE 0.99 08/05/2020   BUN 15 08/05/2020   CO2 25 08/05/2020   TSH 1.94 08/05/2020   PSA 0.66 08/05/2020   INR 1.2 (H) 12/15/2014   HGBA1C 10.1 (A) 09/25/2020   MICROALBUR <0.7 08/05/2020   Assessment/Plan:  La Dibella is a 45 y.o. Other or two or more races [6] Black or African American [2] male with  has a past medical history of ACHILLES TENDINITIS, BILATERAL (10/16/2008), ALLERGIC RHINITIS (08/13/2009), CARDIOMYOPATHY (04/21/2009), CHF (congestive heart failure) (HCC), CONGESTIVE HEART FAILURE (12/04/2007), CORONARY ARTERY DISEASE (12/04/2007), GERD (gastroesophageal reflux disease) (11/01/2011), HYPERLIPIDEMIA (03/12/2007), Hypertension, HYPERTENSION (03/12/2007), OBESITY (04/21/2009), Plantar fasciitis, bilateral (11/01/2011), and SLEEP APNEA (03/12/2007).  Vitamin D deficiency Last vitamin D Lab Results  Component Value Date   VD25OH 24.89 (L) 08/05/2020   Low to start oral replacement   Essential hypertension . BP Readings from Last 3 Encounters:  09/25/20 120/80  09/23/20 122/76  09/07/20 118/76   Stable, pt to continue medical treatment amloipine, coreg, lasix   Dyslipidemia Lab Results  Component Value Date   LDLCALC 68 08/05/2020   Stable, pt to continue current statin - lipitor 80   Diabetes Lab Results  Component Value Date   HGBA1C 10.1 (A) 09/25/2020   Remains severe uncontrolled, for nutrition consult,onetouch meter and cont current tx - glucotrol,  metformin, rybelsus   Followup: Return in about 6 months (around 03/25/2021).  Oliver Barre, MD 09/28/2020 2:10 AM [facility]

## 2020-09-23 NOTE — Patient Instructions (Signed)
Your A1c was improved today  Your onetouch glucometer (and the libre2) were sent to [facility]  Please continue all other medications as before, and refills have been done if requested.  Please have the pharmacy call with any other refills you may need.  Please continue your efforts at being more active, low cholesterol diet, and weight control.  You are otherwise up to date with prevention measures today.  Please keep your appointments with your specialists as you may have planned  Please make an Appointment to return in 6 months, or sooner if needed, also with Lab Appointment for testing done 3-5 days before at the FIRST FLOOR Lab (so this is for TWO appointments - please see the scheduling desk as you leave)  Due to the ongoing Covid 19 pandemic, our lab now requires an appointment for any labs done at our office.  If you need labs done and do not have an appointment, please call our office ahead of time to schedule before presenting to the lab for your testing.

## 2020-09-25 ENCOUNTER — Encounter: Payer: Self-pay | Admitting: Endocrinology

## 2020-09-25 ENCOUNTER — Other Ambulatory Visit: Payer: Self-pay

## 2020-09-25 ENCOUNTER — Ambulatory Visit: Payer: Federal, State, Local not specified - PPO | Admitting: Endocrinology

## 2020-09-25 VITALS — BP 120/80 | HR 62 | Ht 69.0 in | Wt 384.2 lb

## 2020-09-25 DIAGNOSIS — E1165 Type 2 diabetes mellitus with hyperglycemia: Secondary | ICD-10-CM | POA: Diagnosis not present

## 2020-09-25 LAB — POCT GLYCOSYLATED HEMOGLOBIN (HGB A1C): Hemoglobin A1C: 10.1 % — AB (ref 4.0–5.6)

## 2020-09-25 MED ORDER — RYBELSUS 3 MG PO TABS: 3.0000 mg | ORAL_TABLET | Freq: Every day | ORAL | 11 refills | Status: DC

## 2020-09-25 MED ORDER — CONTOUR NEXT TEST VI STRP: 1.0000 | ORAL_STRIP | Freq: Every day | 3 refills | Status: DC

## 2020-09-25 MED ORDER — CONTOUR MONITOR DEVI: 1.0000 | Freq: Once | 0 refills | Status: AC

## 2020-09-25 NOTE — Patient Instructions (Addendum)
good diet and exercise significantly improve the control of your diabetes.  please let me know if you wish to be referred to a dietician.  high blood sugar is very risky to your health.  you should see an eye doctor and dentist every year.  It is very important to get all recommended vaccinations.  Controlling your blood pressure and cholesterol drastically reduces the damage diabetes does to your body.  Those who smoke should quit.  Please discuss these with your doctor.  check your blood sugar once a day.  vary the time of day when you check, between before the 3 meals, and at bedtime.  also check if you have symptoms of your blood sugar being too high or too low.  please keep a record of the readings and bring it to your next appointment here (or you can bring the meter itself).  You can write it on any piece of paper.  please call us sooner if your blood sugar goes below 70, or if most of your readings are over 200.  If so, we'll increase your medication.  I have sent prescriptions to your pharmacy for a new meter, strips, and to add "Rybelsus."   Please come back for a follow-up appointment in 6 weeks.

## 2020-09-25 NOTE — Progress Notes (Signed)
Subjective:    Patient ID: Tyler [name], male    DOB: 1975/09/15, 45 y.o.   MRN: [id number]  HPI pt is referred by Dr Jenny Reichmann, for diabetes.  Pt states DM was dx'ed in 2683; it is complicated by CAD; he has never been on insulin; pt says his diet and exercise are poor; he has never had pancreatitis, pancreatic surgery, severe hypoglycemia or DKA.  He takes 2 oral meds.  He requests a cbg meter.  He needs a CDL to work.     Past Medical History:  Diagnosis Date  . ACHILLES TENDINITIS, BILATERAL 10/16/2008  . ALLERGIC RHINITIS 08/13/2009  . CARDIOMYOPATHY 04/21/2009  . CHF (congestive heart failure) ([place])   . CONGESTIVE HEART FAILURE 12/04/2007  . CORONARY ARTERY DISEASE 12/04/2007  . GERD (gastroesophageal reflux disease) 11/01/2011  . HYPERLIPIDEMIA 03/12/2007  . Hypertension   . HYPERTENSION 03/12/2007  . OBESITY 04/21/2009  . Plantar fasciitis, bilateral 11/01/2011  . SLEEP APNEA 03/12/2007    Past Surgical History:  Procedure Laterality Date  . CARDIAC CATHETERIZATION N/A 12/19/2014   Procedure: Left Heart Cath and Coronary Angiography;  Surgeon: Belva Crome, MD;  Location: [facility];  Service: Cardiovascular;  Laterality: N/A;  . NO PAST SURGERIES      Social History   Socioeconomic History  . Marital status: Married    Spouse name: Not on file  . Number of children: 2  . Years of education: Not on file  . Highest education level: Not on file  Occupational History  . Occupation: Contractor OF [employer]    Employer: [employer]  Tobacco Use  . Smoking status: Never Smoker  . Smokeless tobacco: Never Used  Substance and Sexual Activity  . Alcohol use: No  . Drug use: No  . Sexual activity: Not on file  Other Topics Concern  . Not on file  Social History Narrative  . Not on file   Social Determinants of Health   Financial Resource Strain: Not on file  Food Insecurity: Not on file  Transportation Needs: Not on file  Physical Activity:  Not on file  Stress: Not on file  Social Connections: Not on file  Intimate Partner Violence: Not on file    Current Outpatient Medications on File Prior to Visit  Medication Sig Dispense Refill  . albuterol (PROVENTIL HFA;VENTOLIN HFA) 108 (90 BASE) MCG/ACT inhaler Inhale 2 puffs into the lungs every 6 (six) hours as needed for wheezing. 1 Inhaler 11  . amLODipine (NORVASC) 10 MG tablet Take 1 tablet by mouth once daily 90 tablet 0  . aspirin EC 81 MG tablet Take 81 mg by mouth daily.    Marland Kitchen atorvastatin (LIPITOR) 80 MG tablet Take 1 tablet by mouth once daily 90 tablet 0  . azithromycin (ZITHROMAX) 250 MG tablet TAKE 2 TABLETS BY MOUTH ON DAY 1 THEN TAKE 1 TABLET DAILY FOR THE NEXT 4 DAYS (Patient taking differently: Take by mouth daily. for the next 4 days) 6 tablet 0  . azithromycin (ZITHROMAX) 250 MG tablet TAKE 2 TABLETS BY MOUTH ON DAY 1 THEN TAKE 1 TABLET DAILY FOR THE NEXT 4 DAYS (Patient taking differently: Take by mouth daily. for the next 4 days) 6 tablet 0  . Baloxavir Marboxil,80 MG Dose, 1 x 80 MG TBPK TAKE 1 TABLET BY MOUTH AS DIRECTED PER INSTRUCTIONS IN PACKAGE 1 each 0  . benzonatate (TESSALON) 100 MG capsule TAKE 1 CAPSULE BY MOUTH 3 TIMES DAILY  AS NEEDED FOR COUGH. 21 capsule 0  . Blood Glucose Monitoring Suppl (ONE TOUCH ULTRA 2) w/Device KIT Use as directed twice per day (E11.9) 1 kit 0  . budesonide-formoterol (SYMBICORT) 160-4.5 MCG/ACT inhaler INHALE 2 PUFFS BY MOUTH TWICE A DAY 10.2 g 0  . carvedilol (COREG) 25 MG tablet TAKE 2 TABLETS BY MOUTH TWICE DAILY WITH A MEAL 120 tablet 1  . cetirizine (ZYRTEC) 10 MG tablet Take 10 mg by mouth daily.    . Cholecalciferol (VITAMIN D) 50 MCG (2000 UT) tablet Take 2,000 Units by mouth daily.    . Continuous Blood Gluc Receiver (FREESTYLE LIBRE 2 READER) DEVI USE AS DIRECTED ONCE DAILY 1 each 0  . Continuous Blood Gluc Sensor (FREESTYLE LIBRE 2 SENSOR) MISC USE AS DIRECTED EVERY 14 DAYS 6 each 3  . ezetimibe (ZETIA) 10 MG tablet  Take 1 tablet by mouth once daily 90 tablet 0  . furosemide (LASIX) 20 MG tablet Take 1 tablet by mouth once daily 90 tablet 0  . glipiZIDE (GLUCOTROL XL) 10 MG 24 hr tablet Take 1 tablet (10 mg total) by mouth daily with breakfast. 90 tablet 3  . glucose blood (ONETOUCH ULTRA) test strip Use as instructed twice per day E11.9 200 each 12  . Lancets MISC Use as directed twice per day E11.9 200 each 12  . meclizine (ANTIVERT) 25 MG tablet Take 1 tablet (25 mg total) by mouth 3 (three) times daily as needed for dizziness. 30 tablet 0  . metFORMIN (GLUCOPHAGE) 500 MG tablet Take 4 tablets (2,000 mg total) by mouth daily with breakfast. 360 tablet 3  . Multiple Vitamins-Minerals (MULTIVITAMIN WITH MINERALS) tablet Take 1 tablet by mouth daily.    . nitroGLYCERIN (NITROSTAT) 0.4 MG SL tablet Place 1 tablet (0.4 mg total) under the tongue every 5 (five) minutes as needed for chest pain. 25 tablet 3  . PRESCRIPTION MEDICATION Inhale into the lungs at bedtime. CPAP    . sacubitril-valsartan (ENTRESTO) 49-51 MG TAKE 1 TABLET BY MOUTH TWICE DAILY 60 tablet 11  . spironolactone (ALDACTONE) 25 MG tablet Take 1 tablet by mouth once daily 90 tablet 3  . SYMBICORT 160-4.5 MCG/ACT inhaler SMARTSIG:2 Puff(s) By Mouth Twice Daily     No current facility-administered medications on file prior to visit.    No Known Allergies  Family History  Problem Relation Age of Onset  . Hypertension Other   . Heart attack Mother   . Heart disease Mother   . Diabetes Mother   . Heart attack Father   . Heart disease Father   . Diabetes Father   . Asthma Brother     BP 120/80 (BP Location: Right Arm, Patient Position: Sitting, Cuff Size: Large)   Pulse 62   Ht 5' 9"  (1.753 m)   Wt (!) 384 lb 3.2 oz (174.3 kg)   SpO2 97%   BMI 56.74 kg/m     Review of Systems denies weight loss, blurry vision, chest pain, sob, n/v, urinary frequency, and memory loss.     Objective:   Physical Exam VITAL SIGNS:  See vs  page GENERAL: no distress Pulses: dorsalis pedis intact bilat.   MSK: no deformity of the feet CV: 2+ bilat leg edema Skin:  no ulcer on the feet.  normal color and temp on the feet. Neuro: sensation is intact to touch on the feet.    Lab Results  Component Value Date   HGBA1C 10.1 (A) 09/25/2020   Lab Results  Component  Value Date   CREATININE 0.99 08/05/2020   BUN 15 08/05/2020   NA 135 08/05/2020   K 4.2 08/05/2020   CL 100 08/05/2020   CO2 25 08/05/2020   I have reviewed outside records, and summarized: Pt was noted to have elevated A1c, and referred here.  continuous glucose monitor was rx'ed. Wellness was also addressed.      Assessment & Plan:  Type 2 DM, new to me.  uncontrolled Occupational status: he needs to control DM without insulin  Patient Instructions  good diet and exercise significantly improve the control of your diabetes.  please let me know if you wish to be referred to a dietician.  high blood sugar is very risky to your health.  you should see an eye doctor and dentist every year.  It is very important to get all recommended vaccinations.  Controlling your blood pressure and cholesterol drastically reduces the damage diabetes does to your body.  Those who smoke should quit.  Please discuss these with your doctor.  check your blood sugar once a day.  vary the time of day when you check, between before the 3 meals, and at bedtime.  also check if you have symptoms of your blood sugar being too high or too low.  please keep a record of the readings and bring it to your next appointment here (or you can bring the meter itself).  You can write it on any piece of paper.  please call us sooner if your blood sugar goes below 70, or if most of your readings are over 200.  If so, we'll increase your medication.  I have sent prescriptions to your pharmacy for a new meter, strips, and to add "Rybelsus."   Please come back for a follow-up appointment in 6 weeks.

## 2020-09-28 ENCOUNTER — Encounter: Payer: Self-pay | Admitting: Endocrinology

## 2020-09-28 ENCOUNTER — Encounter: Payer: Self-pay | Admitting: Internal Medicine

## 2020-09-28 NOTE — Addendum Note (Signed)
Addended by: [name] on: 09/28/2020 02:13 AM   Modules accepted: Orders

## 2020-09-28 NOTE — Assessment & Plan Note (Signed)
Last vitamin D Lab Results  Component Value Date   VD25OH 24.89 (L) 08/05/2020   Low to start oral replacement

## 2020-09-28 NOTE — Assessment & Plan Note (Signed)
.   BP Readings from Last 3 Encounters:  09/25/20 120/80  09/23/20 122/76  09/07/20 118/76   Stable, pt to continue medical treatment amloipine, coreg, lasix

## 2020-09-28 NOTE — Assessment & Plan Note (Signed)
Lab Results  Component Value Date   LDLCALC 68 08/05/2020   Stable, pt to continue current statin - lipitor 80

## 2020-09-28 NOTE — Assessment & Plan Note (Signed)
Lab Results  Component Value Date   HGBA1C 10.1 (A) 09/25/2020   Remains severe uncontrolled, for nutrition consult,onetouch meter and cont current tx - glucotrol, metformin, rybelsus

## 2020-10-04 ENCOUNTER — Other Ambulatory Visit: Payer: Self-pay | Admitting: Cardiology

## 2020-10-04 DIAGNOSIS — E785 Hyperlipidemia, unspecified: Secondary | ICD-10-CM

## 2020-10-04 DIAGNOSIS — I429 Cardiomyopathy, unspecified: Secondary | ICD-10-CM

## 2020-10-05 ENCOUNTER — Other Ambulatory Visit: Payer: Self-pay | Admitting: Cardiology

## 2020-10-05 NOTE — Telephone Encounter (Signed)
Rx has been sent to the pharmacy electronically. ° °

## 2020-10-06 ENCOUNTER — Encounter: Payer: Self-pay | Admitting: *Deleted

## 2020-10-06 NOTE — Telephone Encounter (Signed)
Call from [facility] plan stating the Freestyle 2 is covered but the patient will have 30% coinsurance  The patient is expecting a free monitor however he has not enrolled in a diabetic management program to receive  the benefit of getting device without cost.  [employer] went on the say [facility] is covered.   Please obtain prior auth by call [phone]

## 2020-10-07 ENCOUNTER — Other Ambulatory Visit: Payer: Self-pay | Admitting: Endocrinology

## 2020-10-07 MED ORDER — OZEMPIC (1 MG/DOSE) 2 MG/1.5ML ~~LOC~~ SOPN: 1.0000 mg | PEN_INJECTOR | SUBCUTANEOUS | 3 refills | Status: DC

## 2020-10-07 NOTE — Telephone Encounter (Signed)
[employer] was cintact and transferred to [phone]  New PA was denied reasons why will be faxed over.

## 2020-10-12 ENCOUNTER — Other Ambulatory Visit (HOSPITAL_COMMUNITY): Payer: Self-pay

## 2020-10-12 ENCOUNTER — Ambulatory Visit: Payer: Federal, State, Local not specified - PPO | Admitting: Gastroenterology

## 2020-10-12 ENCOUNTER — Encounter: Payer: Self-pay | Admitting: Gastroenterology

## 2020-10-12 ENCOUNTER — Encounter: Payer: Self-pay | Admitting: *Deleted

## 2020-10-12 VITALS — BP 110/70 | HR 64 | Ht 68.0 in | Wt 375.1 lb

## 2020-10-12 DIAGNOSIS — Z1211 Encounter for screening for malignant neoplasm of colon: Secondary | ICD-10-CM

## 2020-10-12 MED ORDER — PLENVU 140 G PO SOLR
1.0000 | ORAL | 0 refills | Status: DC
  Filled 2020-10-12: qty 1, 1d supply, fill #0
  Filled 2020-11-18: qty 3, 3d supply, fill #0

## 2020-10-12 NOTE — Patient Instructions (Signed)
If you are age 47 or younger, your body mass index should be between 19-25. Your Body mass index is 57.04 kg/m. If this is out of the aformentioned range listed, please consider follow up with your Primary Care Provider.   You have been scheduled for a colonoscopy. Please follow written instructions given to you at your visit today.  Please pick up your prep supplies at the pharmacy within the next 1-3 days. If you use inhalers (even only as needed), please bring them with you on the day of your procedure.  Due to recent changes in healthcare laws, you may see the results of your imaging and laboratory studies on MyChart before your provider has had a chance to review them.  We understand that in some cases there may be results that are confusing or concerning to you. Not all laboratory results come back in the same time frame and the provider may be waiting for multiple results in order to interpret others.  Please give [place] 48 hours in order for your provider to thoroughly review all the results before contacting the office for clarification of your results.   Thank you for entrusting me with your care and choosing [facility].  Dr Christella Hartigan

## 2020-10-12 NOTE — Progress Notes (Signed)
HPI: This is a very pleasant 45 year old man who was referred to me by Biagio Borg, MD  to evaluate options for colon cancer screening.    He has never had serious GI issues such as bleeding, significant constipation diarrhea or abdominal pains.  Colon cancer does not run in his family.  He has never had colon cancer screening    Old Data Reviewed: Blood work February 2022 shows normal CBC, hepatitis C negative, slightly low vitamin D, normal vitamin B12, normal complete metabolic profile except glucose 374, PSA normal, TSH normal, hemoglobin A1c 15!    He has morbid obesity with a BMI in the upper 32s  He recently established care with an endocrinologist because of his out-of-control diabetes  He has nonischemic cardiomyopathy but has shown a increase in his left ventricular ejection fraction from 2016-2021, nov 2020 it was 45 to 50%.    Review of systems: Pertinent positive and negative review of systems were noted in the above HPI section. All other review negative.   Past Medical History:  Diagnosis Date  . ACHILLES TENDINITIS, BILATERAL 10/16/2008  . ALLERGIC RHINITIS 08/13/2009  . CARDIOMYOPATHY 04/21/2009  . CHF (congestive heart failure) ([place])   . CONGESTIVE HEART FAILURE 12/04/2007  . CORONARY ARTERY DISEASE 12/04/2007  . GERD (gastroesophageal reflux disease) 11/01/2011  . HYPERLIPIDEMIA 03/12/2007  . HYPERTENSION 03/12/2007  . OBESITY 04/21/2009  . Plantar fasciitis, bilateral 11/01/2011  . SLEEP APNEA 03/12/2007    Past Surgical History:  Procedure Laterality Date  . CARDIAC CATHETERIZATION N/A 12/19/2014   Procedure: Left Heart Cath and Coronary Angiography;  Surgeon: Belva Crome, MD;  Location: [place];  Service: Cardiovascular;  Laterality: N/A;  . NO PAST SURGERIES      Current Outpatient Medications  Medication Sig Dispense Refill  . albuterol (PROVENTIL HFA;VENTOLIN HFA) 108 (90 BASE) MCG/ACT inhaler Inhale 2 puffs into the lungs every 6  (six) hours as needed for wheezing. 1 Inhaler 11  . amLODipine (NORVASC) 10 MG tablet Take 1 tablet by mouth once daily 90 tablet 3  . aspirin EC 81 MG tablet Take 81 mg by mouth daily.    Marland Kitchen atorvastatin (LIPITOR) 80 MG tablet Take 1 tablet by mouth once daily 90 tablet 3  . Baloxavir Marboxil,80 MG Dose, 1 x 80 MG TBPK TAKE 1 TABLET BY MOUTH AS DIRECTED PER INSTRUCTIONS IN PACKAGE 1 each 0  . benzonatate (TESSALON) 100 MG capsule TAKE 1 CAPSULE BY MOUTH 3 TIMES DAILY AS NEEDED FOR COUGH. 21 capsule 0  . Blood Glucose Monitoring Suppl (ONE TOUCH ULTRA 2) w/Device KIT Use as directed twice per day (E11.9) 1 kit 0  . budesonide-formoterol (SYMBICORT) 160-4.5 MCG/ACT inhaler INHALE 2 PUFFS BY MOUTH TWICE A DAY 10.2 g 0  . carvedilol (COREG) 25 MG tablet TAKE 2 TABLETS BY MOUTH TWICE DAILY WITH A MEAL 120 tablet 1  . cetirizine (ZYRTEC) 10 MG tablet Take 10 mg by mouth daily.    . Cholecalciferol (VITAMIN D) 50 MCG (2000 UT) tablet Take 2,000 Units by mouth daily.    . Continuous Blood Gluc Receiver (FREESTYLE LIBRE 2 READER) DEVI USE AS DIRECTED ONCE DAILY 1 each 0  . Continuous Blood Gluc Sensor (FREESTYLE LIBRE 2 SENSOR) MISC USE AS DIRECTED EVERY 14 DAYS 6 each 3  . ezetimibe (ZETIA) 10 MG tablet Take 1 tablet by mouth once daily 90 tablet 0  . furosemide (LASIX) 20 MG tablet Take 1 tablet by mouth once daily 90 tablet  3  . glipiZIDE (GLUCOTROL XL) 10 MG 24 hr tablet Take 1 tablet (10 mg total) by mouth daily with breakfast. 90 tablet 3  . glucose blood (CONTOUR NEXT TEST) test strip 1 each by Other route daily. And lancets 1/day 100 each 3  . glucose blood (ONETOUCH ULTRA) test strip Use as instructed twice per day E11.9 200 each 12  . Lancets MISC Use as directed twice per day E11.9 200 each 12  . meclizine (ANTIVERT) 25 MG tablet Take 1 tablet (25 mg total) by mouth 3 (three) times daily as needed for dizziness. 30 tablet 0  . metFORMIN (GLUCOPHAGE) 500 MG tablet Take 4 tablets (2,000 mg  total) by mouth daily with breakfast. 360 tablet 3  . Multiple Vitamins-Minerals (MULTIVITAMIN WITH MINERALS) tablet Take 1 tablet by mouth daily.    Marland Kitchen PRESCRIPTION MEDICATION Inhale into the lungs at bedtime. CPAP    . sacubitril-valsartan (ENTRESTO) 49-51 MG TAKE 1 TABLET BY MOUTH TWICE DAILY 60 tablet 11  . spironolactone (ALDACTONE) 25 MG tablet Take 1 tablet by mouth once daily 90 tablet 3  . SYMBICORT 160-4.5 MCG/ACT inhaler as needed.    . nitroGLYCERIN (NITROSTAT) 0.4 MG SL tablet Place 1 tablet (0.4 mg total) under the tongue every 5 (five) minutes as needed for chest pain. (Patient not taking: Reported on 10/12/2020) 25 tablet 3  . Semaglutide, 1 MG/DOSE, (OZEMPIC, 1 MG/DOSE,) 2 MG/1.5ML SOPN Inject 1 mg into the skin once a week. (Patient not taking: Reported on 10/12/2020) 9 mL 3   No current facility-administered medications for this visit.    Allergies as of 10/12/2020  . (No Known Allergies)    Family History  Problem Relation Age of Onset  . Hypertension Other   . Heart attack Mother   . Heart disease Mother   . Diabetes Mother   . Heart attack Father   . Heart disease Father   . Diabetes Father   . Asthma Brother   . Diabetes Maternal Grandfather     Social History   Socioeconomic History  . Marital status: Married    Spouse name: Not on file  . Number of children: 3  . Years of education: Not on file  . Highest education level: Not on file  Occupational History  . Occupation: Contractor OF [employer]    Employer: [employer]  Tobacco Use  . Smoking status: Never Smoker  . Smokeless tobacco: Never Used  Vaping Use  . Vaping Use: Never used  Substance and Sexual Activity  . Alcohol use: No  . Drug use: No  . Sexual activity: Not on file  Other Topics Concern  . Not on file  Social History Narrative  . Not on file   Social Determinants of Health   Financial Resource Strain: Not on file  Food Insecurity: Not on file   Transportation Needs: Not on file  Physical Activity: Not on file  Stress: Not on file  Social Connections: Not on file  Intimate Partner Violence: Not on file     Physical Exam: BP 110/70 (BP Location: Left Arm, Patient Position: Sitting, Cuff Size: Large)   Pulse 64   Ht _0  (1.727 m) Comment: height measured without shoes  Wt (!) 375 lb 2 oz (170.2 kg)   BMI 57.04 kg/m  Constitutional: generally well-appearing except for morbid obesity Psychiatric: alert and oriented x3 Eyes: extraocular movements intact Mouth: oral pharynx moist, no lesions Neck: supple no lymphadenopathy Cardiovascular: heart regular rate  and rhythm Lungs: clear to auscultation bilaterally Abdomen: soft, nontender, nondistended, no obvious ascites, no peritoneal signs, normal bowel sounds Extremities: no lower extremity edema bilaterally Skin: no lesions on visible extremities   Assessment and plan: 45 y.o. male with morbid obesity, routine risk for colon cancer  We discussed stool based options versus colonoscopy for routine risk colon cancer screening and he decided to go ahead with full colonoscopy.  He does understand that he is at probably slightly increased risk for procedure related complications given his morbid obesity and his heart disease.  I recommended that colonoscopy be done at the outpatient hospital setting rather than our ambulatory surgical center because of at slightly increased risk.  I see no reason for any further blood tests or imaging studies prior to then.   Please see the "Patient Instructions" section for addition details about the plan.   Owens Loffler, MD [facility] 10/12/2020, 2:53 PM  Cc: Biagio Borg, MD  Total time on date of encounter was 40  minutes (this included time spent preparing to see the patient reviewing records; obtaining and/or reviewing separately obtained history; performing a medically appropriate exam and/or evaluation; counseling and  educating the patient and family if present; ordering medications, tests or procedures if applicable; and documenting clinical information in the health record).

## 2020-10-17 ENCOUNTER — Telehealth: Payer: Self-pay

## 2020-10-17 NOTE — Telephone Encounter (Signed)
Spoke with Tyler [name] and he wanted to know is there an alternative for both Rybelsus and Ozempic. He stated that the Rybelsus is expensive but he would like to stay with that 1 if there is a cheaper alternative but, just in case there is no alternative for Rybelsus, he will try the Ozempic(alternative)

## 2020-10-17 NOTE — Telephone Encounter (Signed)
Let's go with the Ozempic. Please let us know if you need a refill

## 2020-10-20 ENCOUNTER — Other Ambulatory Visit (HOSPITAL_COMMUNITY): Payer: Self-pay

## 2020-10-29 ENCOUNTER — Other Ambulatory Visit (HOSPITAL_COMMUNITY): Payer: Self-pay

## 2020-11-05 LAB — HM DIABETES EYE EXAM

## 2020-11-06 ENCOUNTER — Other Ambulatory Visit (HOSPITAL_COMMUNITY): Payer: Self-pay

## 2020-11-06 ENCOUNTER — Ambulatory Visit: Payer: Federal, State, Local not specified - PPO | Admitting: Endocrinology

## 2020-11-06 ENCOUNTER — Other Ambulatory Visit: Payer: Self-pay

## 2020-11-06 VITALS — BP 114/64 | HR 64 | Ht 68.0 in | Wt 377.6 lb

## 2020-11-06 DIAGNOSIS — E1165 Type 2 diabetes mellitus with hyperglycemia: Secondary | ICD-10-CM | POA: Diagnosis not present

## 2020-11-06 LAB — POCT GLYCOSYLATED HEMOGLOBIN (HGB A1C): Hemoglobin A1C: 7.1 % — AB (ref 4.0–5.6)

## 2020-11-06 MED ORDER — GLIPIZIDE 5 MG PO TABS: 5.0000 mg | ORAL_TABLET | Freq: Every day | ORAL | 3 refills | Status: DC

## 2020-11-06 MED ORDER — RYBELSUS 7 MG PO TABS: 7.0000 mg | ORAL_TABLET | Freq: Every day | ORAL | 3 refills | Status: DC

## 2020-11-06 MED ORDER — FREESTYLE LITE DEVI: 1.0000 | Freq: Every day | 3 refills | Status: DC

## 2020-11-06 MED ORDER — FREESTYLE TEST VI STRP: 1.0000 | ORAL_STRIP | Freq: Every day | 12 refills | Status: DC

## 2020-11-06 NOTE — Progress Notes (Signed)
Subjective:    Patient ID: Tyler [name], male    DOB: 01-Feb-1976, 45 y.o.   MRN: [id number]  HPI Pt returns for f/u of diabetes mellitus:  DM type: 2 Dx'ed: 4076 Complications: CAD Therapy: 3 oral meds.   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  Other: he has never been on insulin Interval history: Pt requests a new meter and rx for strips.  He takes meds as rx'ed.  pt states he feels well in general. Past Medical History:  Diagnosis Date  . ACHILLES TENDINITIS, BILATERAL 10/16/2008  . ALLERGIC RHINITIS 08/13/2009  . CARDIOMYOPATHY 04/21/2009  . CHF (congestive heart failure) ([place])   . CONGESTIVE HEART FAILURE 12/04/2007  . CORONARY ARTERY DISEASE 12/04/2007  . GERD (gastroesophageal reflux disease) 11/01/2011  . HYPERLIPIDEMIA 03/12/2007  . HYPERTENSION 03/12/2007  . OBESITY 04/21/2009  . Plantar fasciitis, bilateral 11/01/2011  . SLEEP APNEA 03/12/2007    Past Surgical History:  Procedure Laterality Date  . CARDIAC CATHETERIZATION N/A 12/19/2014   Procedure: Left Heart Cath and Coronary Angiography;  Surgeon: Belva Crome, MD;  Location: [facility];  Service: Cardiovascular;  Laterality: N/A;  . NO PAST SURGERIES      Social History   Socioeconomic History  . Marital status: Married    Spouse name: Not on file  . Number of children: 3  . Years of education: Not on file  . Highest education level: Not on file  Occupational History  . Occupation: Contractor OF [employer]    Employer: [employer]  Tobacco Use  . Smoking status: Never Smoker  . Smokeless tobacco: Never Used  Vaping Use  . Vaping Use: Never used  Substance and Sexual Activity  . Alcohol use: No  . Drug use: No  . Sexual activity: Not on file  Other Topics Concern  . Not on file  Social History Narrative  . Not on file   Social Determinants of Health   Financial Resource Strain: Not on file   Food Insecurity: Not on file  Transportation Needs: Not on file  Physical Activity: Not on file  Stress: Not on file  Social Connections: Not on file  Intimate Partner Violence: Not on file    Current Outpatient Medications on File Prior to Visit  Medication Sig Dispense Refill  . albuterol (PROVENTIL HFA;VENTOLIN HFA) 108 (90 BASE) MCG/ACT inhaler Inhale 2 puffs into the lungs every 6 (six) hours as needed for wheezing. 1 Inhaler 11  . amLODipine (NORVASC) 10 MG tablet Take 1 tablet by mouth once daily 90 tablet 3  . aspirin EC 81 MG tablet Take 81 mg by mouth daily.    Marland Kitchen atorvastatin (LIPITOR) 80 MG tablet Take 1 tablet by mouth once daily 90 tablet 3  . Baloxavir Marboxil,80 MG Dose, 1 x 80 MG TBPK TAKE 1 TABLET BY MOUTH AS DIRECTED PER INSTRUCTIONS IN PACKAGE 1 each 0  . benzonatate (TESSALON) 100 MG capsule TAKE 1 CAPSULE BY MOUTH 3 TIMES DAILY AS NEEDED FOR COUGH. 21 capsule 0  . budesonide-formoterol (SYMBICORT) 160-4.5 MCG/ACT inhaler INHALE 2 PUFFS BY MOUTH TWICE A DAY 10.2 g 0  . carvedilol (COREG) 25 MG tablet TAKE 2 TABLETS BY MOUTH TWICE DAILY WITH A MEAL 120 tablet 1  . cetirizine (ZYRTEC) 10 MG tablet Take 10 mg by mouth daily.    . Cholecalciferol (VITAMIN D) 50 MCG (2000 UT) tablet Take 2,000 Units  by mouth daily.    . Continuous Blood Gluc Receiver (FREESTYLE LIBRE 2 READER) DEVI USE AS DIRECTED ONCE DAILY 1 each 0  . Continuous Blood Gluc Sensor (FREESTYLE LIBRE 2 SENSOR) MISC USE AS DIRECTED EVERY 14 DAYS 6 each 3  . ezetimibe (ZETIA) 10 MG tablet Take 1 tablet by mouth once daily 90 tablet 0  . furosemide (LASIX) 20 MG tablet Take 1 tablet by mouth once daily 90 tablet 3  . Lancets MISC Use as directed twice per day E11.9 200 each 12  . meclizine (ANTIVERT) 25 MG tablet Take 1 tablet (25 mg total) by mouth 3 (three) times daily as needed for dizziness. 30 tablet 0  . metFORMIN (GLUCOPHAGE) 500 MG tablet Take 4 tablets (2,000 mg total) by mouth daily with breakfast.  360 tablet 3  . Multiple Vitamins-Minerals (MULTIVITAMIN WITH MINERALS) tablet Take 1 tablet by mouth daily.    . nitroGLYCERIN (NITROSTAT) 0.4 MG SL tablet Place 1 tablet (0.4 mg total) under the tongue every 5 (five) minutes as needed for chest pain. 25 tablet 3  . PEG-KCl-NaCl-NaSulf-Na Asc-C (PLENVU) 140 g SOLR Take 1 kit by mouth as directed. 1 each 0  . PRESCRIPTION MEDICATION Inhale into the lungs at bedtime. CPAP    . sacubitril-valsartan (ENTRESTO) 49-51 MG TAKE 1 TABLET BY MOUTH TWICE DAILY 60 tablet 11  . spironolactone (ALDACTONE) 25 MG tablet Take 1 tablet by mouth once daily 90 tablet 3  . SYMBICORT 160-4.5 MCG/ACT inhaler as needed.     No current facility-administered medications on file prior to visit.    No Known Allergies  Family History  Problem Relation Age of Onset  . Hypertension Other   . Heart attack Mother   . Heart disease Mother   . Diabetes Mother   . Heart attack Father   . Heart disease Father   . Diabetes Father   . Asthma Brother   . Diabetes Maternal Grandfather     BP 114/64 (BP Location: Right Arm, Patient Position: Sitting, Cuff Size: Large)   Pulse 64   Ht 5' 8" (1.727 m)   Wt (!) 377 lb 9.6 oz (171.3 kg)   SpO2 97%   BMI 57.41 kg/m    Review of Systems     Objective:   Physical Exam VITAL SIGNS:  See vs page GENERAL: no distress  Lab Results  Component Value Date   HGBA1C 7.1 (A) 11/06/2020       Assessment & Plan:  Type 2 DM: uncontrolled.  In order to safely improve A1c, he'll need to reduce SU rx  Patient Instructions  check your blood sugar once a day.  vary the time of day when you check, between before the 3 meals, and at bedtime.  also check if you have symptoms of your blood sugar being too high or too low.  please keep a record of the readings and bring it to your next appointment here (or you can bring the meter itself).  You can write it on any piece of paper.  please call us sooner if your blood sugar goes below  70, or if most of your readings are over 200.  If so, we'll increase your medication.  I have sent prescriptions to your pharmacy, to increase the Rybelsus, and reduce the glipizide.  Please continue the same metformin.   Please come back for a follow-up appointment in 2 months.

## 2020-11-06 NOTE — Patient Instructions (Addendum)
check your blood sugar once a day.  vary the time of day when you check, between before the 3 meals, and at bedtime.  also check if you have symptoms of your blood sugar being too high or too low.  please keep a record of the readings and bring it to your next appointment here (or you can bring the meter itself).  You can write it on any piece of paper.  please call us sooner if your blood sugar goes below 70, or if most of your readings are over 200.  If so, we'll increase your medication.  I have sent prescriptions to your pharmacy, to increase the Rybelsus, and reduce the glipizide.  Please continue the same metformin.   Please come back for a follow-up appointment in 2 months.

## 2020-11-10 ENCOUNTER — Other Ambulatory Visit: Payer: Self-pay | Admitting: Cardiology

## 2020-11-11 ENCOUNTER — Other Ambulatory Visit (HOSPITAL_COMMUNITY): Payer: Self-pay

## 2020-11-12 ENCOUNTER — Encounter (HOSPITAL_COMMUNITY): Payer: Self-pay

## 2020-11-12 ENCOUNTER — Other Ambulatory Visit: Payer: Self-pay

## 2020-11-13 ENCOUNTER — Other Ambulatory Visit (HOSPITAL_COMMUNITY): Payer: Federal, State, Local not specified - PPO

## 2020-11-17 ENCOUNTER — Other Ambulatory Visit (HOSPITAL_COMMUNITY)
Admission: RE | Admit: 2020-11-17 | Discharge: 2020-11-17 | Disposition: A | Discharge status: Home / Self Care | Payer: Federal, State, Local not specified - PPO | Source: Ambulatory Visit | Attending: Gastroenterology | Admitting: Gastroenterology

## 2020-11-17 DIAGNOSIS — I509 Heart failure, unspecified: Secondary | ICD-10-CM | POA: Diagnosis not present

## 2020-11-17 DIAGNOSIS — Z01812 Encounter for preprocedural laboratory examination: Secondary | ICD-10-CM | POA: Insufficient documentation

## 2020-11-17 DIAGNOSIS — Z825 Family history of asthma and other chronic lower respiratory diseases: Secondary | ICD-10-CM | POA: Diagnosis not present

## 2020-11-17 DIAGNOSIS — Z20822 Contact with and (suspected) exposure to covid-19: Secondary | ICD-10-CM | POA: Insufficient documentation

## 2020-11-17 DIAGNOSIS — K219 Gastro-esophageal reflux disease without esophagitis: Secondary | ICD-10-CM | POA: Diagnosis not present

## 2020-11-17 DIAGNOSIS — K5730 Diverticulosis of large intestine without perforation or abscess without bleeding: Secondary | ICD-10-CM | POA: Diagnosis not present

## 2020-11-17 DIAGNOSIS — I110 Hypertensive heart disease with heart failure: Secondary | ICD-10-CM | POA: Diagnosis not present

## 2020-11-17 DIAGNOSIS — Z833 Family history of diabetes mellitus: Secondary | ICD-10-CM | POA: Diagnosis not present

## 2020-11-17 DIAGNOSIS — Z8249 Family history of ischemic heart disease and other diseases of the circulatory system: Secondary | ICD-10-CM | POA: Diagnosis not present

## 2020-11-17 DIAGNOSIS — Z1211 Encounter for screening for malignant neoplasm of colon: Secondary | ICD-10-CM | POA: Diagnosis not present

## 2020-11-17 LAB — SARS CORONAVIRUS 2 (TAT 6-24 HRS): SARS Coronavirus 2: NEGATIVE

## 2020-11-18 ENCOUNTER — Other Ambulatory Visit (HOSPITAL_COMMUNITY): Payer: Self-pay

## 2020-11-19 ENCOUNTER — Ambulatory Visit (HOSPITAL_COMMUNITY)
Admission: RE | Admit: 2020-11-19 | Discharge: 2020-11-19 | Disposition: A | Discharge status: Home / Self Care | Payer: Federal, State, Local not specified - PPO | Attending: Gastroenterology | Admitting: Gastroenterology

## 2020-11-19 ENCOUNTER — Encounter (HOSPITAL_COMMUNITY)
Admission: RE | Disposition: A | Discharge status: Home / Self Care | Payer: Self-pay | Source: Home / Self Care | Attending: Gastroenterology

## 2020-11-19 ENCOUNTER — Other Ambulatory Visit: Payer: Self-pay

## 2020-11-19 ENCOUNTER — Ambulatory Visit (HOSPITAL_COMMUNITY): Payer: Federal, State, Local not specified - PPO | Admitting: Certified Registered Nurse Anesthetist

## 2020-11-19 ENCOUNTER — Encounter (HOSPITAL_COMMUNITY): Payer: Self-pay | Admitting: Gastroenterology

## 2020-11-19 DIAGNOSIS — Z833 Family history of diabetes mellitus: Secondary | ICD-10-CM | POA: Diagnosis not present

## 2020-11-19 DIAGNOSIS — Z1211 Encounter for screening for malignant neoplasm of colon: Secondary | ICD-10-CM | POA: Insufficient documentation

## 2020-11-19 DIAGNOSIS — G4733 Obstructive sleep apnea (adult) (pediatric): Secondary | ICD-10-CM | POA: Diagnosis not present

## 2020-11-19 DIAGNOSIS — Z825 Family history of asthma and other chronic lower respiratory diseases: Secondary | ICD-10-CM | POA: Insufficient documentation

## 2020-11-19 DIAGNOSIS — Z20822 Contact with and (suspected) exposure to covid-19: Secondary | ICD-10-CM | POA: Insufficient documentation

## 2020-11-19 DIAGNOSIS — Z8249 Family history of ischemic heart disease and other diseases of the circulatory system: Secondary | ICD-10-CM | POA: Insufficient documentation

## 2020-11-19 DIAGNOSIS — K5730 Diverticulosis of large intestine without perforation or abscess without bleeding: Secondary | ICD-10-CM | POA: Diagnosis not present

## 2020-11-19 DIAGNOSIS — I110 Hypertensive heart disease with heart failure: Secondary | ICD-10-CM | POA: Diagnosis not present

## 2020-11-19 DIAGNOSIS — K219 Gastro-esophageal reflux disease without esophagitis: Secondary | ICD-10-CM | POA: Insufficient documentation

## 2020-11-19 DIAGNOSIS — Z9989 Dependence on other enabling machines and devices: Secondary | ICD-10-CM | POA: Diagnosis not present

## 2020-11-19 DIAGNOSIS — I509 Heart failure, unspecified: Secondary | ICD-10-CM | POA: Insufficient documentation

## 2020-11-19 HISTORY — PX: COLONOSCOPY WITH PROPOFOL: SHX5780

## 2020-11-19 LAB — GLUCOSE, CAPILLARY: Glucose-Capillary: 95 mg/dL (ref 70–99)

## 2020-11-19 SURGERY — COLONOSCOPY WITH PROPOFOL
Anesthesia: Monitor Anesthesia Care

## 2020-11-19 MED ORDER — LACTATED RINGERS IV SOLN
INTRAVENOUS | Status: AC | PRN
  Administered 2020-11-19: 1000 mL via INTRAVENOUS

## 2020-11-19 MED ORDER — SODIUM CHLORIDE 0.9 % IV SOLN: INTRAVENOUS | Status: DC

## 2020-11-19 MED ORDER — PROPOFOL 500 MG/50ML IV EMUL
INTRAVENOUS | Status: DC | PRN
  Administered 2020-11-19: 100 ug/kg/min via INTRAVENOUS

## 2020-11-19 MED ORDER — PHENYLEPHRINE 40 MCG/ML (10ML) SYRINGE FOR IV PUSH (FOR BLOOD PRESSURE SUPPORT)
PREFILLED_SYRINGE | INTRAVENOUS | Status: DC | PRN
  Administered 2020-11-19 (×2): 80 ug via INTRAVENOUS

## 2020-11-19 MED ORDER — PROPOFOL 10 MG/ML IV BOLUS
INTRAVENOUS | Status: AC
  Filled 2020-11-19: qty 20

## 2020-11-19 MED ORDER — PROPOFOL 10 MG/ML IV BOLUS
INTRAVENOUS | Status: DC | PRN
  Administered 2020-11-19: 50 mg via INTRAVENOUS
  Administered 2020-11-19: 30 mg via INTRAVENOUS

## 2020-11-19 SURGICAL SUPPLY — 22 items

## 2020-11-19 NOTE — Anesthesia Postprocedure Evaluation (Signed)
Anesthesia Post Note  Patient: Tyler [name]  Procedure(s) Performed: COLONOSCOPY WITH PROPOFOL (N/A )     Patient location during evaluation: PACU Anesthesia Type: MAC Level of consciousness: awake and alert Pain management: pain level controlled Vital Signs Assessment: post-procedure vital signs reviewed and stable Respiratory status: spontaneous breathing, nonlabored ventilation and respiratory function stable Cardiovascular status: stable and blood pressure returned to baseline Postop Assessment: no apparent nausea or vomiting Anesthetic complications: no   No complications documented.  Last Vitals:  Vitals:   11/19/20 0950 11/19/20 1001  BP: (!) 160/102 114/89  Pulse: 78 (!) 59  Resp: 15 14  Temp:    SpO2: 98% 100%    Last Pain:  Vitals:   11/19/20 0943  TempSrc: Oral  PainSc: 0-No pain                 Anie Juniel A.

## 2020-11-19 NOTE — Anesthesia Procedure Notes (Signed)
Procedure Name: MAC Date/Time: 11/19/2020 9:12 AM Performed by: Deliah Boston, CRNA Pre-anesthesia Checklist: Patient identified, Emergency Drugs available, Suction available and Patient being monitored Patient Re-evaluated:Patient Re-evaluated prior to induction Oxygen Delivery Method: Simple face mask Preoxygenation: Pre-oxygenation with 100% oxygen Induction Type: IV induction Placement Confirmation: positive ETCO2 and breath sounds checked- equal and bilateral

## 2020-11-19 NOTE — Op Note (Signed)
[facility] Patient Name: Tyler [name] Procedure Date: 11/19/2020 MRN: [id number] Attending MD: Rachael Fee , MD Date of Birth: 07/03/1975 CSN: [id number] Age: 45 Admit Type: Outpatient Procedure:                Colonoscopy Indications:              Screening for colorectal malignant neoplasm Providers:                Rachael Fee, MD, Shelda Jakes, RN, Beryle Beams, Technician, Randon Goldsmith, CRNA Referring MD:              Medicines:                Monitored Anesthesia Care Complications:            No immediate complications. Estimated blood loss:                            None. Estimated Blood Loss:     Estimated blood loss: none. Procedure:                Pre-Anesthesia Assessment:                           - Prior to the procedure, a History and Physical                            was performed, and patient medications and                            allergies were reviewed. The patient's tolerance of                            previous anesthesia was also reviewed. The risks                            and benefits of the procedure and the sedation                            options and risks were discussed with the patient.                            All questions were answered, and informed consent                            was obtained. Prior Anticoagulants: The patient has                            taken no previous anticoagulant or antiplatelet                            agents. ASA Grade Assessment: IV - A patient with  severe systemic disease that is a constant threat                            to life. After reviewing the risks and benefits,                            the patient was deemed in satisfactory condition to                            undergo the procedure.                           After obtaining informed consent, the colonoscope                            was passed  under direct vision. Throughout the                            procedure, the patient's blood pressure, pulse, and                            oxygen saturations were monitored continuously. The                            [id number]-HQ190L ([id number] Olympus colonoscope was                            introduced through the anus and advanced to the the                            cecum, identified by appendiceal orifice and                            ileocecal valve. The colonoscopy was performed                            without difficulty. The patient tolerated the                            procedure well. The quality of the bowel                            preparation was good. The ileocecal valve,                            appendiceal orifice, and rectum were photographed. Scope In: 9:22:29 AM Scope Out: 9:36:27 AM Scope Withdrawal Time: 0 hours 10 minutes 34 seconds  Total Procedure Duration: 0 hours 13 minutes 58 seconds  Findings:      A few small and large-mouthed diverticula were found in the entire colon.      The exam was otherwise without abnormality on direct and retroflexion       views. Impression:               - Diverticulosis in the entire examined colon.                           -  The examination was otherwise normal on direct                            and retroflexion views.                           - No polyps or cancers. Moderate Sedation:      Not Applicable - Patient had care per Anesthesia. Recommendation:           - Patient has a contact number available for                            emergencies. The signs and symptoms of potential                            delayed complications were discussed with the                            patient. Return to normal activities tomorrow.                            Written discharge instructions were provided to the                            patient.                           - Resume previous diet.                            - Continue present medications.                           - Repeat colonoscopy in 10 years for screening. Procedure Code(s):        --- Professional ---                           [phone], Colonoscopy, flexible; diagnostic, including                            collection of specimen(s) by brushing or washing,                            when performed (separate procedure) Diagnosis Code(s):        --- Professional ---                           Z12.11, Encounter for screening for malignant                            neoplasm of colon                           K57.30, Diverticulosis of large intestine without  perforation or abscess without bleeding CPT copyright 2019 American Medical Association. All rights reserved. The codes documented in this report are preliminary and upon coder review may  be revised to meet current compliance requirements. Rachael Fee, MD 11/19/2020 9:45:02 AM This report has been signed electronically. Number of Addenda: 0

## 2020-11-19 NOTE — H&P (Signed)
  HPI: This is a man with routine risk for CRC  ROS: complete GI ROS as described in HPI, all other review negative.  Constitutional:  No unintentional weight loss   Past Medical History:  Diagnosis Date  . ACHILLES TENDINITIS, BILATERAL 10/16/2008  . ALLERGIC RHINITIS 08/13/2009  . CARDIOMYOPATHY 04/21/2009  . CHF (congestive heart failure) ([facility])   . CONGESTIVE HEART FAILURE 12/04/2007  . CORONARY ARTERY DISEASE 12/04/2007  . GERD (gastroesophageal reflux disease) 11/01/2011  . HYPERLIPIDEMIA 03/12/2007  . HYPERTENSION 03/12/2007  . OBESITY 04/21/2009  . Plantar fasciitis, bilateral 11/01/2011  . SLEEP APNEA 03/12/2007    Past Surgical History:  Procedure Laterality Date  . CARDIAC CATHETERIZATION N/A 12/19/2014   Procedure: Left Heart Cath and Coronary Angiography;  Surgeon: Lyn Records, MD;  Location: [facility];  Service: Cardiovascular;  Laterality: N/A;  . NO PAST SURGERIES      No current facility-administered medications for this encounter.    Allergies as of 10/12/2020  . (No Known Allergies)    Family History  Problem Relation Age of Onset  . Hypertension Other   . Heart attack Mother   . Heart disease Mother   . Diabetes Mother   . Heart attack Father   . Heart disease Father   . Diabetes Father   . Asthma Brother   . Diabetes Maternal Grandfather     Social History   Socioeconomic History  . Marital status: Married    Spouse name: Not on file  . Number of children: 3  . Years of education: Not on file  . Highest education level: Not on file  Occupational History  . Occupation: Engineer, maintenance OF [place]    Employer: [employer]  Tobacco Use  . Smoking status: Never Smoker  . Smokeless tobacco: Never Used  Vaping Use  . Vaping Use: Never used  Substance and Sexual Activity  . Alcohol use: No  . Drug use: No  . Sexual activity: Not on file  Other Topics Concern  . Not on file  Social History Narrative  . Not on file    Social Determinants of Health   Financial Resource Strain: Not on file  Food Insecurity: Not on file  Transportation Needs: Not on file  Physical Activity: Not on file  Stress: Not on file  Social Connections: Not on file  Intimate Partner Violence: Not on file     Physical Exam: Ht 5\' 9"  (1.753 m)   Wt (!) 170.1 kg   BMI 55.38 kg/m  Constitutional: generally well-appearing Psychiatric: alert and oriented x3 Abdomen: soft, nontender, nondistended, no obvious ascites, no peritoneal signs, normal bowel sounds No peripheral edema noted in lower extremities  Assessment and plan: 45 y.o. male with routine risk for colon cancer, morbidly obese  For colonoscopy today  Please see the "Patient Instructions" section for addition details about the plan.  47, MD [facility] 11/19/2020, 8:37 AM

## 2020-11-19 NOTE — Discharge Instructions (Signed)

## 2020-11-19 NOTE — Anesthesia Preprocedure Evaluation (Signed)
Anesthesia Evaluation  Patient identified by MRN, date of birth, ID band Patient awake    Reviewed: Allergy & Precautions, NPO status , Patient's Chart, lab work & pertinent test results, reviewed documented beta blocker date and time   Airway Mallampati: II  TM Distance: >3 FB Neck ROM: Full    Dental  (+) Missing, Caps, Dental Advisory Given,    Pulmonary shortness of breath, with exertion and at rest, asthma , sleep apnea and Continuous Positive Airway Pressure Ventilation ,    Pulmonary exam normal breath sounds clear to auscultation       Cardiovascular hypertension, Pt. on medications and Pt. on home beta blockers + CAD and +CHF  Normal cardiovascular exam Rhythm:Regular Rate:Normal  EKG 08/2020 NSR, Septal infarct  Echo 04/30/2019 1. Left ventricular ejection fraction, by visual estimation, is 45 to 50%. The left ventricle has mildly decreased function. There is moderately increased left ventricular hypertrophy.  2. Left ventricular diastolic parameters are consistent with Grade I diastolic dysfunction (impaired relaxation).  3. Global right ventricle has normal systolic function.The right ventricular size is normal. No increase in right ventricular wall thickness.  4. Left atrial size was normal.  5. Right atrial size was normal.  6. The mitral valve is grossly normal. Trace mitral valve regurgitation.  7. The tricuspid valve is grossly normal. Tricuspid valve regurgitation  is trivial.  8. The aortic valve is tricuspid. Aortic valve regurgitation is trivial.  9. The pulmonic valve was grossly normal. Pulmonic valve regurgitation is trivial.  10. Normal pulmonary artery systolic pressure.  11. The inferior vena cava is normal in size with greater than 50% respiratory variability, suggesting right atrial pressure of 3 mmHg.   Cardiac Cath 12/19/2014 1. 1st RPLB lesion, 90% stenosed. 2. Dist LAD lesion, 90%  stenosed. 3. Prox RCA to Dist RCA lesion, 50% stenosed.    Tight apical LAD disease, greater than 90%. Tight small right coronary left ventricular branch, 90% with very small distal distribution.  Diffuse luminal irregularities throughout the proximal mid and distal RCA.  Dilated left ventricle with moderate to severe global hypokinesis and an estimated ejection fraction of 25-35%.   RECOMMENDATIONS:  Medical therapy to include high-dose beta blocker, and angiotensin system blockade. Long-acting nitrates would be helpful with reference to anterior ischemic effect.  Further management per Dr. Stanford Breed.     Neuro/Psych negative neurological ROS  negative psych ROS   GI/Hepatic Neg liver ROS, GERD  Medicated,  Endo/Other  diabetes, Well Controlled, Type 2, Oral Hypoglycemic AgentsMorbid obesityHyperlipidemia  Renal/GU   negative genitourinary   Musculoskeletal negative musculoskeletal ROS (+)   Abdominal (+) + obese,   Peds  Hematology   Anesthesia Other Findings   Reproductive/Obstetrics                             Anesthesia Physical Anesthesia Plan  ASA: III  Anesthesia Plan: MAC   Post-op Pain Management:    Induction:   PONV Risk Score and Plan: 1 and Treatment may vary due to age or medical condition and Propofol infusion  Airway Management Planned: Natural Airway, Nasal Cannula and Simple Face Mask  Additional Equipment:   Intra-op Plan:   Post-operative Plan:   Informed Consent: I have reviewed the patients History and Physical, chart, labs and discussed the procedure including the risks, benefits and alternatives for the proposed anesthesia with the patient or authorized representative who has indicated his/her understanding and acceptance.  Dental advisory given  Plan Discussed with: CRNA and Anesthesiologist  Anesthesia Plan Comments:         Anesthesia Quick Evaluation

## 2020-11-19 NOTE — Transfer of Care (Signed)
Immediate Anesthesia Transfer of Care Note  Patient: Tyler [name]  Procedure(s) Performed: Procedure(s): COLONOSCOPY WITH PROPOFOL (N/A)  Patient Location: PACU  Anesthesia Type:MAC  Level of Consciousness: Patient easily awoken, sedated, comfortable, cooperative, following commands, responds to stimulation.   Airway & Oxygen Therapy: Patient spontaneously breathing, ventilating well, oxygen via simple oxygen mask.  Post-op Assessment: Report given to PACU RN, vital signs reviewed and stable, moving all extremities.   Post vital signs: Reviewed and stable.  Complications: No apparent anesthesia complications  Last Vitals:  Vitals Value Taken Time  BP    Temp    Pulse 74 11/19/20 0946  Resp 15 11/19/20 0946  SpO2 100 % 11/19/20 0946  Vitals shown include unvalidated device data.  Last Pain:  Vitals:   11/19/20 0854  TempSrc: Oral  PainSc: 0-No pain         Complications: No complications documented.

## 2020-11-20 ENCOUNTER — Encounter (HOSPITAL_COMMUNITY): Payer: Self-pay | Admitting: Gastroenterology

## 2020-12-02 ENCOUNTER — Other Ambulatory Visit (HOSPITAL_COMMUNITY): Payer: Self-pay

## 2020-12-02 ENCOUNTER — Other Ambulatory Visit: Payer: Self-pay | Admitting: Cardiology

## 2020-12-02 MED ORDER — FREESTYLE LANCETS MISC
12 refills | Status: AC
  Filled 2020-12-02 – 2020-12-11 (×2): qty 100, 90d supply, fill #0

## 2020-12-02 MED ORDER — FREESTYLE LITE TEST VI STRP
ORAL_STRIP | 12 refills | Status: DC
  Filled 2020-12-02: qty 100, 90d supply, fill #0
  Filled 2020-12-11: qty 100, 100d supply, fill #0

## 2020-12-02 MED ORDER — FREESTYLE LITE W/DEVICE KIT
PACK | 3 refills | Status: AC
  Filled 2020-12-02: qty 1, 30d supply, fill #0
  Filled 2020-12-11: qty 1, 1d supply, fill #0

## 2020-12-10 ENCOUNTER — Other Ambulatory Visit (HOSPITAL_COMMUNITY): Payer: Self-pay

## 2020-12-11 ENCOUNTER — Other Ambulatory Visit (HOSPITAL_COMMUNITY): Payer: Self-pay

## 2020-12-11 MED ORDER — ERYTHROMYCIN 5 MG/GM OP OINT
TOPICAL_OINTMENT | Freq: Three times a day (TID) | OPHTHALMIC | 0 refills | Status: DC
  Filled 2020-12-11: qty 3.5, 7d supply, fill #0

## 2021-01-05 ENCOUNTER — Ambulatory Visit: Payer: Federal, State, Local not specified - PPO | Admitting: Internal Medicine

## 2021-01-05 ENCOUNTER — Ambulatory Visit (INDEPENDENT_AMBULATORY_CARE_PROVIDER_SITE_OTHER): Payer: Federal, State, Local not specified - PPO

## 2021-01-05 ENCOUNTER — Encounter: Payer: Self-pay | Admitting: Internal Medicine

## 2021-01-05 ENCOUNTER — Other Ambulatory Visit (HOSPITAL_COMMUNITY): Payer: Self-pay

## 2021-01-05 ENCOUNTER — Other Ambulatory Visit: Payer: Self-pay

## 2021-01-05 VITALS — BP 142/72 | HR 58 | Temp 98.0°F | Ht 68.0 in | Wt 378.8 lb

## 2021-01-05 DIAGNOSIS — M79605 Pain in left leg: Secondary | ICD-10-CM

## 2021-01-05 DIAGNOSIS — E559 Vitamin D deficiency, unspecified: Secondary | ICD-10-CM | POA: Diagnosis not present

## 2021-01-05 DIAGNOSIS — E538 Deficiency of other specified B group vitamins: Secondary | ICD-10-CM | POA: Diagnosis not present

## 2021-01-05 DIAGNOSIS — E1165 Type 2 diabetes mellitus with hyperglycemia: Secondary | ICD-10-CM | POA: Diagnosis not present

## 2021-01-05 DIAGNOSIS — M25562 Pain in left knee: Secondary | ICD-10-CM | POA: Diagnosis not present

## 2021-01-05 LAB — LIPID PANEL
Cholesterol: 96 mg/dL (ref 0–200)
HDL: 31 mg/dL — ABNORMAL LOW (ref >39.00)
LDL Cholesterol: 39 mg/dL (ref 0–99)
NonHDL: 64.77
Total CHOL/HDL Ratio: 3
Triglycerides: 130 mg/dL (ref 0.0–149.0)
VLDL: 26 mg/dL (ref 0.0–40.0)

## 2021-01-05 LAB — HEPATIC FUNCTION PANEL
ALT: 21 U/L (ref 0–53)
AST: 15 U/L (ref 0–37)
Albumin: 4.2 g/dL (ref 3.5–5.2)
Alkaline Phosphatase: 68 U/L (ref 39–117)
Bilirubin, Direct: 0.1 mg/dL (ref 0.0–0.3)
Total Bilirubin: 0.3 mg/dL (ref 0.2–1.2)
Total Protein: 7 g/dL (ref 6.0–8.3)

## 2021-01-05 LAB — BASIC METABOLIC PANEL
BUN: 20 mg/dL (ref 6–23)
CO2: 28 mEq/L (ref 19–32)
Calcium: 8.9 mg/dL (ref 8.4–10.5)
Chloride: 104 mEq/L (ref 96–112)
Creatinine, Ser: 1.16 mg/dL (ref 0.40–1.50)
GFR: 75.23 mL/min (ref >60.00)
Glucose, Bld: 97 mg/dL (ref 70–99)
Potassium: 3.9 mEq/L (ref 3.5–5.1)
Sodium: 139 mEq/L (ref 135–145)

## 2021-01-05 LAB — VITAMIN D 25 HYDROXY (VIT D DEFICIENCY, FRACTURES): VITD: 34.5 ng/mL (ref 30.00–100.00)

## 2021-01-05 LAB — HEMOGLOBIN A1C: Hgb A1c MFr Bld: 6.6 % — ABNORMAL HIGH (ref 4.6–6.5)

## 2021-01-05 LAB — VITAMIN B12: Vitamin B-12: 387 pg/mL (ref 211–911)

## 2021-01-05 MED ORDER — MELOXICAM 15 MG PO TABS
15.0000 mg | ORAL_TABLET | Freq: Every day | ORAL | 0 refills | Status: DC
  Filled 2021-01-05: qty 30, 30d supply, fill #0

## 2021-01-05 NOTE — Progress Notes (Signed)
Subjective:  Patient ID: Tyler [name], male    DOB: 1975-10-04  Age: 45 y.o. 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  It was uncontrolled a few months ago.  He states that he is taking his meds regularly and that his blood sugar has been good.  Outpatient Medications Prior to Visit  Medication Sig Dispense Refill   albuterol (PROVENTIL HFA;VENTOLIN HFA) 108 (90 BASE) MCG/ACT inhaler Inhale 2 puffs into the lungs every 6 (six) hours as needed for wheezing. 1 Inhaler 11   amLODipine (NORVASC) 10 MG tablet Take 1 tablet by mouth once daily (Patient taking differently: Take 10 mg by mouth daily.) 90 tablet 3   aspirin EC 81 MG tablet Take 81 mg by mouth daily.     atorvastatin (LIPITOR) 80 MG tablet Take 1 tablet by mouth once daily (Patient taking differently: Take 80 mg by mouth daily.) 90 tablet 3   Baloxavir Marboxil,80 MG Dose, 1 x 80 MG TBPK TAKE 1 TABLET BY MOUTH AS DIRECTED PER INSTRUCTIONS IN PACKAGE 1 each 0   benzonatate (TESSALON) 100 MG capsule TAKE 1 CAPSULE BY MOUTH 3 TIMES DAILY AS NEEDED FOR COUGH. 21 capsule 0   Blood Glucose Monitoring Suppl (FREESTYLE LITE) DEVI 1 Device by Does not apply route daily. 100 each 3   Blood Glucose Monitoring Suppl (FREESTYLE LITE) w/Device KIT Use daily as directed to test blood glucose. 1 kit 3   budesonide-formoterol (SYMBICORT) 160-4.5 MCG/ACT inhaler INHALE 2 PUFFS BY MOUTH TWICE A DAY (Patient taking differently: Inhale 2 puffs into the lungs 2 (two) times daily as needed (asthma).) 10.2 g 0   carvedilol (COREG) 25 MG tablet TAKE 2 TABLETS BY MOUTH TWICE DAILY WITH A MEAL . APPOINTMENT REQUIRED FOR FUTURE REFILLS 120 tablet 0   cetirizine (ZYRTEC) 10 MG tablet Take 10  mg by mouth daily.     Cholecalciferol (VITAMIN D) 50 MCG (2000 UT) tablet Take 2,000 Units by mouth daily.     Continuous Blood Gluc Receiver (FREESTYLE LIBRE 2 READER) DEVI USE AS DIRECTED ONCE DAILY 1 each 0   Continuous Blood Gluc Sensor (FREESTYLE LIBRE 2 SENSOR) MISC USE AS DIRECTED EVERY 14 DAYS 6 each 3   erythromycin ophthalmic ointment Apply a 1 inch ribbon to lower lid 3 (three) times daily for 1 week 3.5 g 0   ezetimibe (ZETIA) 10 MG tablet Take 1 tablet (10 mg total) by mouth daily. 90 tablet 3   furosemide (LASIX) 20 MG tablet Take 1 tablet by mouth once daily (Patient taking differently: Take 20 mg by mouth daily.) 90 tablet 3   glipiZIDE (GLUCOTROL) 5 MG tablet Take 1 tablet (5 mg total) by mouth daily before breakfast. 90 tablet 3   glucose blood (FREESTYLE LITE) test strip Use 1 strip to check blood glucose once daily as directed. 100 each 12   glucose blood (FREESTYLE TEST STRIPS) test strip 1 each by Other route daily. And lancets 1/day 100 each 12   Lancets (FREESTYLE) lancets Use 1 lancet once daily as directed to test blood glucose. 100 each 12   Lancets MISC Use as directed twice per day  E11.9 200 each 12   metFORMIN (GLUCOPHAGE) 500 MG tablet Take 4 tablets (2,000 mg total) by mouth daily with breakfast. 360 tablet 3   Multiple Vitamins-Minerals (MULTIVITAMIN WITH MINERALS) tablet Take 1 tablet by mouth daily.     nitroGLYCERIN (NITROSTAT) 0.4 MG SL tablet Place 1 tablet (0.4 mg total) under the tongue every 5 (five) minutes as needed for chest pain. 25 tablet 3   PEG-KCl-NaCl-NaSulf-Na Asc-C (PLENVU) 140 g SOLR Take 1 kit by mouth as directed. 3 each 0   PRESCRIPTION MEDICATION Inhale into the lungs at bedtime. CPAP     sacubitril-valsartan (ENTRESTO) 49-51 MG TAKE 1 TABLET BY MOUTH TWICE DAILY 60 tablet 11   Semaglutide (RYBELSUS) 7 MG TABS Take 7 mg by mouth daily. 90 tablet 3   spironolactone (ALDACTONE) 25 MG tablet Take 1 tablet by mouth once daily (Patient taking  differently: Take 25 mg by mouth daily.) 90 tablet 3   No facility-administered medications prior to visit.    ROS: Review of Systems  Constitutional:  Negative for appetite change, fatigue and unexpected weight change.  HENT:  Negative for congestion, nosebleeds, sneezing, sore throat and trouble swallowing.   Eyes:  Negative for itching and visual disturbance.  Respiratory:  Negative for cough.   Cardiovascular:  Negative for chest pain, palpitations and leg swelling.  Gastrointestinal:  Negative for abdominal distention, blood in stool, diarrhea and nausea.  Genitourinary:  Negative for frequency and hematuria.  Musculoskeletal:  Positive for arthralgias and gait problem. Negative for back pain, joint swelling and neck pain.  Skin:  Negative for rash.  Neurological:  Negative for dizziness, tremors, speech difficulty and weakness.  Psychiatric/Behavioral:  Negative for agitation, dysphoric mood, sleep disturbance and suicidal ideas. The patient is not nervous/anxious.    Objective:  BP (!) 142/72 (BP Location: Left Arm)   Pulse (!) 58   Temp 98 F (36.7 C) (Oral)   Ht 5' 8"  (1.727 m)   Wt (!) 378 lb 12.8 oz (171.8 kg)   SpO2 97%   BMI 57.60 kg/m   BP Readings from Last 3 Encounters:  01/05/21 (!) 142/72  11/19/20 (!) 154/108  11/06/20 114/64    Wt Readings from Last 3 Encounters:  01/05/21 (!) 378 lb 12.8 oz (171.8 kg)  11/19/20 (!) 375 lb (170.1 kg)  11/06/20 (!) 377 lb 9.6 oz (171.3 kg)    Physical Exam Constitutional:      General: He is not in acute distress.    Appearance: He is well-developed. He is obese.     Comments: NAD  Eyes:     Conjunctiva/sclera: Conjunctivae normal.     Pupils: Pupils are equal, round, and reactive to light.  Neck:     Thyroid: No thyromegaly.     Vascular: No JVD.  Cardiovascular:     Rate and Rhythm: Normal rate and regular rhythm.     Heart sounds: Normal heart sounds. No murmur heard.   No friction rub. No gallop.   Pulmonary:     Effort: Pulmonary effort is normal. No respiratory distress.     Breath sounds: Normal breath sounds. No wheezing or rales.  Chest:     Chest wall: No tenderness.  Abdominal:     General: Bowel sounds are normal. There is no distension.     Palpations: Abdomen is soft. There is no mass.     Tenderness: There is no abdominal tenderness. There is no guarding or rebound.  Musculoskeletal:  General: Tenderness and deformity present. Normal range of motion.     Cervical back: Normal range of motion.     Right lower leg: No edema.     Left lower leg: No edema.  Lymphadenopathy:     Cervical: No cervical adenopathy.  Skin:    General: Skin is warm and dry.     Findings: No erythema or rash.  Neurological:     Mental Status: He is alert and oriented to person, place, and time.     Cranial Nerves: No cranial nerve deficit.     Motor: No weakness or abnormal muscle tone.     Coordination: Coordination normal.     Gait: Gait abnormal.     Deep Tendon Reflexes: Reflexes are normal and symmetric.  Psychiatric:        Behavior: Behavior normal.        Thought Content: Thought content normal.        Judgment: Judgment normal.   The patient is limping.  Left knee without swelling or redness.  Slightly tender posteriorly and medially.  No palpable Baker's cyst.  No signs of DVT.  It is slightly bigger than the right.  There is an asymmetry of fatty tissue on the proximal inner thigh-more on the right than on the left.  There is no obvious varicosities or lymphedema present. Lab Results  Component Value Date   WBC 9.1 08/05/2020   HGB 13.5 08/05/2020   HCT 40.4 08/05/2020   PLT 213.0 08/05/2020   GLUCOSE 374 (H) 08/05/2020   CHOL 126 08/05/2020   TRIG 123.0 08/05/2020   HDL 33.20 (L) 08/05/2020   LDLDIRECT 183.6 08/02/2011   LDLCALC 68 08/05/2020   ALT 27 08/05/2020   AST 15 08/05/2020   NA 135 08/05/2020   K 4.2 08/05/2020   CL 100 08/05/2020   CREATININE 0.99  08/05/2020   BUN 15 08/05/2020   CO2 25 08/05/2020   TSH 1.94 08/05/2020   PSA 0.66 08/05/2020   INR 1.2 (H) 12/15/2014   HGBA1C 7.1 (A) 11/06/2020   MICROALBUR <0.7 08/05/2020    No results found.  Assessment & Plan:     Follow-up: No follow-ups on file.  Walker Kehr, MD

## 2021-01-05 NOTE — Assessment & Plan Note (Signed)
It is probably a contributing factor to his  L knee problem.  He is right leg, specifically right inner thigh fat is more prominent than on the left side.  There is probably an underlying chronic venous insufficiency present in his lower extremities.

## 2021-01-05 NOTE — Assessment & Plan Note (Signed)
His DM was uncontrolled a few months ago with a hemoglobin A1c of 15%.  He states that he is taking his meds regularly and that his blood sugar has been good.  I asked him to do his blood work today that was ordered by Dr. Jonny Ruiz in April.

## 2021-01-05 NOTE — Assessment & Plan Note (Signed)
New -- posterior L knee, leg pain since last week.  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  He is morbidly obese.  He probably has an underlying arthritis in the knee. We will obtain a knee x-ray.  Obtain D-dimer to rule out DVT.  Consider sports medicine consultation. I prescribed meloxicam 15 mg daily after meals.

## 2021-01-05 NOTE — Assessment & Plan Note (Signed)
Continue with vitamin D 

## 2021-01-06 LAB — D-DIMER, QUANTITATIVE: D-Dimer, Quant: 0.24 mcg/mL FEU (ref <0.50)

## 2021-01-11 ENCOUNTER — Other Ambulatory Visit: Payer: Self-pay

## 2021-01-11 ENCOUNTER — Ambulatory Visit: Payer: Federal, State, Local not specified - PPO | Admitting: Endocrinology

## 2021-01-11 VITALS — BP 114/70 | HR 72 | Ht 68.0 in | Wt 371.8 lb

## 2021-01-11 DIAGNOSIS — E1165 Type 2 diabetes mellitus with hyperglycemia: Secondary | ICD-10-CM

## 2021-01-11 LAB — POCT GLYCOSYLATED HEMOGLOBIN (HGB A1C): Hemoglobin A1C: 6.1 % — AB (ref 4.0–5.6)

## 2021-01-11 MED ORDER — GLIPIZIDE 5 MG PO TABS: 2.5000 mg | ORAL_TABLET | Freq: Every day | ORAL | 3 refills | Status: DC

## 2021-01-11 NOTE — Progress Notes (Signed)
Subjective:    Patient ID: Tyler [name], male    DOB: [birth year], 46 y.o.   MRN: [id number]  HPI Pt returns for f/u of diabetes mellitus:  DM type: 2 Dx'ed: 5621 Complications: CAD Therapy: 3 oral meds.   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  Other: he has never been on insulin Interval history: He brings a record of his cbg's which I have reviewed today.  CBG varies from 90-118.  He takes meds as rx'ed.  pt states he feels well in general.   Past Medical History:  Diagnosis Date   ACHILLES TENDINITIS, BILATERAL 10/16/2008   ALLERGIC RHINITIS 08/13/2009   CARDIOMYOPATHY 04/21/2009   CHF (congestive heart failure) ([place])    CONGESTIVE HEART FAILURE 12/04/2007   CORONARY ARTERY DISEASE 12/04/2007   GERD (gastroesophageal reflux disease) 11/01/2011   HYPERLIPIDEMIA 03/12/2007   HYPERTENSION 03/12/2007   OBESITY 04/21/2009   Plantar fasciitis, bilateral 11/01/2011   SLEEP APNEA 03/12/2007    Past Surgical History:  Procedure Laterality Date   CARDIAC CATHETERIZATION N/A 12/19/2014   Procedure: Left Heart Cath and Coronary Angiography;  Surgeon: Belva Crome, MD;  Location: [facility];  Service: Cardiovascular;  Laterality: N/A;   COLONOSCOPY WITH PROPOFOL N/A 11/19/2020   Procedure: COLONOSCOPY WITH PROPOFOL;  Surgeon: Milus Banister, MD;  Location: [facility];  Service: Endoscopy;  Laterality: N/A;   NO PAST SURGERIES      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[employer] [place]    Employer: [employer]  Tobacco Use   Smoking status: Never   Smokeless tobacco: Never  Vaping Use   Vaping Use: Never used  Substance and Sexual Activity   Alcohol use: No   Drug use: No    Sexual activity: Not on file  Other Topics Concern   Not on file  Social History Narrative   Not on file   Social Determinants of Health   Financial Resource Strain: Not on file  Food Insecurity: Not on file  Transportation Needs: Not on file  Physical Activity: Not on file  Stress: Not on file  Social Connections: Not on file  Intimate Partner Violence: Not on file    Current Outpatient Medications on File Prior to Visit  Medication Sig Dispense Refill   albuterol (PROVENTIL HFA;VENTOLIN HFA) 108 (90 BASE) MCG/ACT inhaler Inhale 2 puffs into the lungs every 6 (six) hours as needed for wheezing. 1 Inhaler 11   amLODipine (NORVASC) 10 MG tablet Take 1 tablet by mouth once daily (Patient taking differently: Take 10 mg by mouth daily.) 90 tablet 3   aspirin EC 81 MG tablet Take 81 mg by mouth daily.     atorvastatin (LIPITOR) 80 MG tablet Take 1 tablet by mouth once daily (Patient taking differently: Take 80 mg by mouth daily.) 90 tablet 3   Baloxavir Marboxil,80 MG Dose, 1 x 80 MG TBPK TAKE 1 TABLET BY MOUTH AS DIRECTED PER INSTRUCTIONS IN PACKAGE 1 each 0   benzonatate (TESSALON) 100 MG capsule TAKE 1 CAPSULE BY MOUTH 3 TIMES DAILY AS NEEDED FOR COUGH. 21 capsule 0   Blood Glucose Monitoring Suppl (FREESTYLE LITE) DEVI 1 Device  by Does not apply route daily. 100 each 3   Blood Glucose Monitoring Suppl (FREESTYLE LITE) w/Device KIT Use daily as directed to test blood glucose. 1 kit 3   budesonide-formoterol (SYMBICORT) 160-4.5 MCG/ACT inhaler INHALE 2 PUFFS BY MOUTH TWICE A DAY (Patient taking differently: Inhale 2 puffs into the lungs 2 (two) times daily as needed (asthma).) 10.2 g 0   carvedilol (COREG) 25 MG tablet TAKE 2 TABLETS BY MOUTH TWICE DAILY WITH A MEAL . APPOINTMENT REQUIRED FOR FUTURE REFILLS 120 tablet 0   cetirizine (ZYRTEC) 10 MG tablet Take 10 mg by mouth daily.     Cholecalciferol (VITAMIN D) 50 MCG (2000 UT) tablet Take 2,000 Units by mouth daily.     Continuous Blood  Gluc Receiver (FREESTYLE LIBRE 2 READER) DEVI USE AS DIRECTED ONCE DAILY 1 each 0   Continuous Blood Gluc Sensor (FREESTYLE LIBRE 2 SENSOR) MISC USE AS DIRECTED EVERY 14 DAYS 6 each 3   erythromycin ophthalmic ointment Apply a 1 inch ribbon to lower lid 3 (three) times daily for 1 week 3.5 g 0   ezetimibe (ZETIA) 10 MG tablet Take 1 tablet (10 mg total) by mouth daily. 90 tablet 3   furosemide (LASIX) 20 MG tablet Take 1 tablet by mouth once daily (Patient taking differently: Take 20 mg by mouth daily.) 90 tablet 3   glucose blood (FREESTYLE LITE) test strip Use 1 strip to check blood glucose once daily as directed. 100 each 12   glucose blood (FREESTYLE TEST STRIPS) test strip 1 each by Other route daily. And lancets 1/day 100 each 12   Lancets (FREESTYLE) lancets Use 1 lancet once daily as directed to test blood glucose. 100 each 12   Lancets MISC Use as directed twice per day E11.9 200 each 12   meloxicam (MOBIC) 15 MG tablet Take 1 tablet (15 mg total) by mouth daily. 30 tablet 0   metFORMIN (GLUCOPHAGE) 500 MG tablet Take 4 tablets (2,000 mg total) by mouth daily with breakfast. 360 tablet 3   Multiple Vitamins-Minerals (MULTIVITAMIN WITH MINERALS) tablet Take 1 tablet by mouth daily.     nitroGLYCERIN (NITROSTAT) 0.4 MG SL tablet Place 1 tablet (0.4 mg total) under the tongue every 5 (five) minutes as needed for chest pain. 25 tablet 3   PEG-KCl-NaCl-NaSulf-Na Asc-C (PLENVU) 140 g SOLR Take 1 kit by mouth as directed. 3 each 0   PRESCRIPTION MEDICATION Inhale into the lungs at bedtime. CPAP     sacubitril-valsartan (ENTRESTO) 49-51 MG TAKE 1 TABLET BY MOUTH TWICE DAILY 60 tablet 11   Semaglutide (RYBELSUS) 7 MG TABS Take 7 mg by mouth daily. 90 tablet 3   spironolactone (ALDACTONE) 25 MG tablet Take 1 tablet by mouth once daily (Patient taking differently: Take 25 mg by mouth daily.) 90 tablet 3   No current facility-administered medications on file prior to visit.    No Known  Allergies  Family History  Problem Relation Age of Onset   Hypertension Other    Heart attack Mother    Heart disease Mother    Diabetes Mother    Heart attack Father    Heart disease Father    Diabetes Father    Asthma Brother    Diabetes Maternal Grandfather     BP 114/70 (BP Location: Right Arm, Patient Position: Sitting, Cuff Size: Large)   Pulse 72   Ht _0  (1.727 m)   Wt (!) 371 lb 12.8 oz (168.6 kg)   SpO2 96%  BMI 56.53 kg/m    Review of Systems     Objective:   Physical Exam Pulses: dorsalis pedis intact bilat.   MSK: no deformity of the feet CV: 2+ bilat leg edema Skin:  no ulcer on the feet.  normal color and temp on the feet.   Neuro: sensation is intact to touch on the feet.    Lab Results  Component Value Date   HGBA1C 6.1 (A) 01/11/2021   Lab Results  Component Value Date   CREATININE 1.16 01/05/2021   BUN 20 01/05/2021   NA 139 01/05/2021   K 3.9 01/05/2021   CL 104 01/05/2021   CO2 28 01/05/2021      Assessment & Plan:  Type 2 DM: overcontrolled   Patient Instructions  check your blood sugar once a day.  vary the time of day when you check, between before the 3 meals, and at bedtime.  also check if you have symptoms of your blood sugar being too high or too low.  please keep a record of the readings and bring it to your next appointment here (or you can bring the meter itself).  You can write it on any piece of paper.  please call us sooner if your blood sugar goes below 70, or if most of your readings are over 200.  If so, we'll increase your medication.  I have sent prescriptions to your pharmacy, to half the glipizide.  Please continue the same other medications Please come back for a follow-up appointment in 3 months.

## 2021-01-11 NOTE — Patient Instructions (Addendum)
check your blood sugar once a day.  vary the time of day when you check, between before the 3 meals, and at bedtime.  also check if you have symptoms of your blood sugar being too high or too low.  please keep a record of the readings and bring it to your next appointment here (or you can bring the meter itself).  You can write it on any piece of paper.  please call us sooner if your blood sugar goes below 70, or if most of your readings are over 200.  If so, we'll increase your medication.  I have sent prescriptions to your pharmacy, to half the glipizide.  Please continue the same other medications Please come back for a follow-up appointment in 3 months.

## 2021-01-15 NOTE — Progress Notes (Signed)
HPI M never smoker followed for bronchitis, OSA,, allergic rhinitis complicated by CHF, GERD, morbid obesity Unattended home sleep study 08/26/13- AHI 17/ hr, weight 393 lbs --------------------------------------------------------------------------- 01/20/20- 44 yoM never smoker followed for bronchitis, OSA,, allergic rhinitis complicated by CHF/CM, GERD, morbid obesity Weight 396 pounds today CPAP auto 8-20/[employer] Download compliance 90%, AHI 0.4/ hr    FF mask    Feels very satisfied. Had 2 Phizer Covax  01/18/21- 45 yoM never smoker followed for bronchitis, OSA,, allergic rhinitis complicated by CHF/CM, GERD, morbid obesity CPAP auto 8-20/ [employer] Download- compliance 83%, AHI 0.5/ hr from April Body weight today-373 lbs Covid vax-3 Phizer -----OSA- everything is working great.  He denies problems with CPAP, sleeping ok. Discussed replacing old machine Denies new cardiac concerns. Feels medically stable.  ROS-see HPI   + = positive Constitutional:    weight loss, night sweats, fevers, chills,+fatigue, lassitude. HEENT:    headaches, difficulty swallowing, tooth/dental problems, sore throat,       sneezing, itching, ear ache, nasal congestion, post nasal drip, snoring CV:    chest pain, orthopnea, PND, swelling in lower extremities, anasarca,                                                      dizziness, palpitations Resp:   +shortness of breath with exertion or at rest.                productive cough,   non-productive cough, coughing up of blood.              change in color of mucus.  wheezing.   Skin:    rash or lesions. GI:  No-   heartburn, indigestion, abdominal pain, nausea, vomiting,  GU:  MS:   joint pain, stiffness,  Neuro-     nothing unusual Psych:  change in mood or affect.  depression or anxiety.   memory loss.  OBJ- Physical Exam General- Alert, Oriented, Affect-appropriate, Distress- none acute, + morbid obesity Skin- rash-none, lesions- none, excoriation-  none Lymphadenopathy- none Head- atraumatic            Eyes- Gross vision intact, PERRLA, conjunctivae and secretions clear            Ears- Hearing, canals-normal            Nose- Clear, no-Septal dev, mucus, polyps, erosion, perforation             Throat- Mallampati IV , mucosa clear , drainage- none, tonsils- atrophic Neck- flexible , trachea midline, no stridor , thyroid nl, carotid no bruit Chest - symmetrical excursion , unlabored           Heart/CV- RRR , no murmur , no gallop  , no rub, nl s1 s2                           - JVD- none , edema- none, stasis changes- none, varices- none           Lung- clear to P&A, wheeze- none, cough- none , dullness-none, rub- none           Chest wall-  Abd-  Br/ Gen/ Rectal- Not done, not indicated Extrem- cyanosis- none, clubbing, none, atrophy- none, strength- nl Neuro- grossly intact to observation    .

## 2021-01-18 ENCOUNTER — Ambulatory Visit: Payer: Federal, State, Local not specified - PPO | Admitting: Internal Medicine

## 2021-01-18 ENCOUNTER — Other Ambulatory Visit: Payer: Self-pay

## 2021-01-18 ENCOUNTER — Encounter: Payer: Self-pay | Admitting: Internal Medicine

## 2021-01-18 DIAGNOSIS — I509 Heart failure, unspecified: Secondary | ICD-10-CM | POA: Diagnosis not present

## 2021-01-18 DIAGNOSIS — G4733 Obstructive sleep apnea (adult) (pediatric): Secondary | ICD-10-CM | POA: Diagnosis not present

## 2021-01-18 NOTE — Patient Instructions (Signed)
Order- DME [employer] please reinstall AirView if able.                       Please replace old CPAP machine auto 8-20, mask of choice, humidifier, supplies, AirView/ card.  Please call if we can help

## 2021-01-20 NOTE — Assessment & Plan Note (Addendum)
Benefits with good compliance and control Plan- replace old CPAP machine auto 8-20, renew AirView/ card

## 2021-01-20 NOTE — Assessment & Plan Note (Signed)
Follows with cardiology- no new concerns

## 2021-01-20 NOTE — Assessment & Plan Note (Signed)
Not making useful progress. Consider [facility].

## 2021-02-01 ENCOUNTER — Ambulatory Visit: Payer: Federal, State, Local not specified - PPO | Admitting: Internal Medicine

## 2021-02-16 ENCOUNTER — Other Ambulatory Visit: Payer: Self-pay | Admitting: Cardiology

## 2021-02-17 ENCOUNTER — Encounter: Payer: Self-pay | Admitting: Endocrinology

## 2021-03-13 ENCOUNTER — Other Ambulatory Visit (HOSPITAL_COMMUNITY): Payer: Self-pay

## 2021-03-13 DIAGNOSIS — M25562 Pain in left knee: Secondary | ICD-10-CM | POA: Insufficient documentation

## 2021-03-13 DIAGNOSIS — M76892 Other specified enthesopathies of left lower limb, excluding foot: Secondary | ICD-10-CM | POA: Diagnosis not present

## 2021-03-13 MED ORDER — MELOXICAM 15 MG PO TABS
15.0000 mg | ORAL_TABLET | Freq: Every day | ORAL | 0 refills | Status: DC
  Filled 2021-03-13: qty 30, 30d supply, fill #0

## 2021-03-17 DIAGNOSIS — M25562 Pain in left knee: Secondary | ICD-10-CM | POA: Diagnosis not present

## 2021-03-18 ENCOUNTER — Encounter (HOSPITAL_COMMUNITY): Payer: Self-pay | Admitting: Emergency Medicine

## 2021-03-18 ENCOUNTER — Emergency Department (HOSPITAL_COMMUNITY)
Admission: EM | Admit: 2021-03-18 | Discharge: 2021-03-18 | Disposition: A | Discharge status: Home / Self Care | Payer: Federal, State, Local not specified - PPO | Attending: Emergency Medicine | Admitting: Emergency Medicine

## 2021-03-18 ENCOUNTER — Other Ambulatory Visit (HOSPITAL_COMMUNITY): Payer: Self-pay

## 2021-03-18 DIAGNOSIS — I10 Essential (primary) hypertension: Secondary | ICD-10-CM | POA: Insufficient documentation

## 2021-03-18 DIAGNOSIS — J45909 Unspecified asthma, uncomplicated: Secondary | ICD-10-CM | POA: Insufficient documentation

## 2021-03-18 DIAGNOSIS — Z7984 Long term (current) use of oral hypoglycemic drugs: Secondary | ICD-10-CM | POA: Insufficient documentation

## 2021-03-18 DIAGNOSIS — Z7982 Long term (current) use of aspirin: Secondary | ICD-10-CM | POA: Diagnosis not present

## 2021-03-18 DIAGNOSIS — H5711 Ocular pain, right eye: Secondary | ICD-10-CM | POA: Insufficient documentation

## 2021-03-18 DIAGNOSIS — I2510 Atherosclerotic heart disease of native coronary artery without angina pectoris: Secondary | ICD-10-CM | POA: Diagnosis not present

## 2021-03-18 DIAGNOSIS — Z8616 Personal history of COVID-19: Secondary | ICD-10-CM | POA: Insufficient documentation

## 2021-03-18 DIAGNOSIS — E119 Type 2 diabetes mellitus without complications: Secondary | ICD-10-CM | POA: Diagnosis not present

## 2021-03-18 DIAGNOSIS — Z79899 Other long term (current) drug therapy: Secondary | ICD-10-CM | POA: Diagnosis not present

## 2021-03-18 DIAGNOSIS — S0501XA Injury of conjunctiva and corneal abrasion without foreign body, right eye, initial encounter: Secondary | ICD-10-CM | POA: Diagnosis not present

## 2021-03-18 MED ORDER — NEOMYCIN-POLYMYXIN-DEXAMETH 3.5-10000-0.1 OP SUSP
OPHTHALMIC | 0 refills | Status: DC
  Filled 2021-03-18: qty 5, 25d supply, fill #0

## 2021-03-18 MED ORDER — ERYTHROMYCIN 5 MG/GM OP OINT
TOPICAL_OINTMENT | Freq: Once | OPHTHALMIC | Status: AC
  Administered 2021-03-18: 1 via OPHTHALMIC
  Filled 2021-03-18: qty 3.5

## 2021-03-18 MED ORDER — ERYTHROMYCIN 5 MG/GM OP OINT: TOPICAL_OINTMENT | Freq: Three times a day (TID) | OPHTHALMIC | 0 refills | Status: AC

## 2021-03-18 MED ORDER — TETRACAINE HCL 0.5 % OP SOLN
2.0000 [drp] | Freq: Once | OPHTHALMIC | Status: AC
  Administered 2021-03-18: 2 [drp] via OPHTHALMIC
  Filled 2021-03-18: qty 4

## 2021-03-18 MED ORDER — FLUORESCEIN SODIUM 1 MG OP STRP
1.0000 | ORAL_STRIP | Freq: Once | OPHTHALMIC | Status: AC
  Administered 2021-03-18: 1 via OPHTHALMIC
  Filled 2021-03-18: qty 1

## 2021-03-18 NOTE — ED Provider Notes (Signed)
[facility] DEPT Provider Note   CSN: [id number] Arrival date & time: 03/18/21  0201     History Chief Complaint  Patient presents with   Eye Problem    Tyler [name] is a 45 y.o. male.  Patient was hit in his right globe with a branch earlier today.  He was doing okay but then started having foreign body sensation this evening and presents here for further evaluation.  Patient already had tetracaine eyedrops prior to my evaluation and states that his eye pain is much better with that.  He has no loss of vision.  Does have some tearing.   Eye Problem Location:  Right eye Quality:  Aching, dull, tearing and sharp Severity:  Mild     Past Medical History:  Diagnosis Date   ACHILLES TENDINITIS, BILATERAL 10/16/2008   ALLERGIC RHINITIS 08/13/2009   CARDIOMYOPATHY 04/21/2009   CHF (congestive heart failure) ([place])    CONGESTIVE HEART FAILURE 12/04/2007   CORONARY ARTERY DISEASE 12/04/2007   GERD (gastroesophageal reflux disease) 11/01/2011   HYPERLIPIDEMIA 03/12/2007   HYPERTENSION 03/12/2007   OBESITY 04/21/2009   Plantar fasciitis, bilateral 11/01/2011   SLEEP APNEA 03/12/2007    Patient Active Problem List   Diagnosis Date Noted   Leg pain, left 01/05/2021   Vitamin D deficiency 08/05/2020   COVID-19 virus infection 06/23/2020   Leg mass, left 08/28/2018   Left elbow pain 06/22/2018   Pain of left heel 12/15/2017   Vertigo 07/18/2017   Left ear hearing loss 07/18/2017   Acute right-sided low back pain without sciatica 10/01/2016   Abnormal myocardial perfusion study 12/19/2014   Mild intermittent acute asthmatic bronchitis 11/14/2014   Wheezing 09/03/2014   Dyspnea 03/19/2013   Nausea alone 11/27/2012   Plantar fasciitis, bilateral 11/01/2011   Diabetes ([place]) 11/01/2011   Cutaneous skin tags 11/01/2011   GERD (gastroesophageal reflux disease) 11/01/2011   Encounter for well adult exam with abnormal findings 04/28/2011   Ingrown nail  04/28/2011   ALLERGIC RHINITIS 08/13/2009   Morbid obesity ([place]) 04/21/2009   CARDIOMYOPATHY 04/21/2009   ACHILLES TENDINITIS, BILATERAL 10/16/2008   Coronary atherosclerosis 12/04/2007   Congestive heart failure ([place]) 12/04/2007   Dyslipidemia 03/12/2007   Essential hypertension 03/12/2007   OSA (obstructive sleep apnea) 03/12/2007    Past Surgical History:  Procedure Laterality Date   CARDIAC CATHETERIZATION N/A 12/19/2014   Procedure: Left Heart Cath and Coronary Angiography;  Surgeon: Belva Crome, MD;  Location: [facility];  Service: Cardiovascular;  Laterality: N/A;   COLONOSCOPY WITH PROPOFOL N/A 11/19/2020   Procedure: COLONOSCOPY WITH PROPOFOL;  Surgeon: Milus Banister, MD;  Location: [facility];  Service: Endoscopy;  Laterality: N/A;   NO PAST SURGERIES         Family History  Problem Relation Age of Onset   Hypertension Other    Heart attack Mother    Heart disease Mother    Diabetes Mother    Heart attack Father    Heart disease Father    Diabetes Father    Asthma Brother    Diabetes Maternal Grandfather     Social History   Tobacco Use   Smoking status: Never   Smokeless tobacco: Never   Tobacco comments:    never  Vaping Use   Vaping Use: Never used  Substance Use Topics   Alcohol use: No   Drug use: No    Home Medications Prior to Admission medications   Medication Sig Start Date  End Date Taking? Authorizing Provider  erythromycin ophthalmic ointment Place into the right eye 3 (three) times daily for 7 days. Place a 1/2 inch ribbon of ointment into the lower eyelid. 03/18/21 03/25/21 Yes Tajae Rybicki, Corene Cornea, MD  albuterol (PROVENTIL HFA;VENTOLIN HFA) 108 (90 BASE) MCG/ACT inhaler Inhale 2 puffs into the lungs every 6 (six) hours as needed for wheezing. 03/19/13   Biagio Borg, MD  amLODipine (NORVASC) 10 MG tablet Take 1 tablet by mouth once daily Patient taking differently: Take 10 mg by mouth daily. 10/06/20   Lelon Perla, MD  aspirin EC  81 MG tablet Take 81 mg by mouth daily.    [provider]  atorvastatin (LIPITOR) 80 MG tablet Take 1 tablet by mouth once daily Patient taking differently: Take 80 mg by mouth daily. 10/05/20   Lelon Perla, MD  Baloxavir Marboxil,80 MG Dose, 1 x 80 MG TBPK TAKE 1 TABLET BY MOUTH AS DIRECTED PER INSTRUCTIONS IN PACKAGE 06/01/20 06/01/21  Shella Spearing, PA-C  benzonatate (TESSALON) 100 MG capsule TAKE 1 CAPSULE BY MOUTH 3 TIMES DAILY AS NEEDED FOR COUGH. 06/01/20 06/01/21  Shella Spearing, PA-C  Blood Glucose Monitoring Suppl (FREESTYLE LITE) DEVI 1 Device by Does not apply route daily. 11/06/20   Renato Shin, MD  Blood Glucose Monitoring Suppl (FREESTYLE LITE) w/Device KIT Use daily as directed to test blood glucose. 11/06/20     budesonide-formoterol (SYMBICORT) 160-4.5 MCG/ACT inhaler INHALE 2 PUFFS BY MOUTH TWICE A DAY Patient taking differently: Inhale 2 puffs into the lungs 2 (two) times daily as needed (asthma). 06/19/20 06/19/21  Allen, [place], NP  carvedilol (COREG) 25 MG tablet TAKE 2 TABLETS BY MOUTH TWICE DAILY WITH A MEAL . APPOINTMENT REQUIRED FOR FUTURE REFILLS 02/16/21   Lelon Perla, MD  cetirizine (ZYRTEC) 10 MG tablet Take 10 mg by mouth daily.    [provider]  Cholecalciferol (VITAMIN D) 50 MCG (2000 UT) tablet Take 2,000 Units by mouth daily. 06/19/20   [provider]  Continuous Blood Gluc Receiver (FREESTYLE LIBRE 2 READER) DEVI USE AS DIRECTED ONCE DAILY 09/23/20 09/23/21  Biagio Borg, MD  Continuous Blood Gluc Sensor (FREESTYLE LIBRE 2 SENSOR) MISC USE AS DIRECTED EVERY 14 DAYS 09/23/20 09/23/21  Biagio Borg, MD  ezetimibe (ZETIA) 10 MG tablet Take 1 tablet (10 mg total) by mouth daily. 11/10/20   Lelon Perla, MD  furosemide (LASIX) 20 MG tablet Take 1 tablet by mouth once daily Patient taking differently: Take 20 mg by mouth daily. 10/05/20   Lelon Perla, MD  glipiZIDE (GLUCOTROL) 5 MG tablet Take 0.5 tablets (2.5 mg  total) by mouth daily before breakfast. 01/11/21   Renato Shin, MD  glucose blood (FREESTYLE LITE) test strip Use 1 strip to check blood glucose once daily as directed. 11/06/20     glucose blood (FREESTYLE TEST STRIPS) test strip 1 each by Other route daily. And lancets 1/day 11/06/20   Renato Shin, MD  Lancets (FREESTYLE) lancets Use 1 lancet once daily as directed to test blood glucose. 11/06/20     Lancets MISC Use as directed twice per day E11.9 09/23/20   Biagio Borg, MD  meloxicam (MOBIC) 15 MG tablet Take 1 tablet (15 mg total) by mouth daily with meals 03/13/21     metFORMIN (GLUCOPHAGE) 500 MG tablet Take 4 tablets (2,000 mg total) by mouth daily with breakfast. 08/11/20   Biagio Borg, MD  Multiple Vitamins-Minerals (MULTIVITAMIN WITH MINERALS) tablet Take 1 tablet  by mouth daily.    [provider]  nitroGLYCERIN (NITROSTAT) 0.4 MG SL tablet Place 1 tablet (0.4 mg total) under the tongue every 5 (five) minutes as needed for chest pain. 12/10/14   Burtis Junes, NP  PEG-KCl-NaCl-NaSulf-Na Asc-C (PLENVU) 140 g SOLR Take 1 kit by mouth as directed. 10/12/20   Milus Banister, MD  PRESCRIPTION MEDICATION Inhale into the lungs at bedtime. CPAP    [provider]  RYBELSUS 3 MG TABS Take 1 tablet by mouth daily. 10/13/20   [provider]  sacubitril-valsartan (ENTRESTO) 49-51 MG TAKE 1 TABLET BY MOUTH TWICE DAILY 09/07/20 09/07/21  Lelon Perla, MD  Semaglutide (RYBELSUS) 7 MG TABS Take 7 mg by mouth daily. 11/06/20   Renato Shin, MD  spironolactone (ALDACTONE) 25 MG tablet Take 1 tablet by mouth once daily Patient taking differently: Take 25 mg by mouth daily. 09/14/20   Lelon Perla, MD    Allergies    Patient has no known allergies.  Review of Systems   Review of Systems  All other systems reviewed and are negative.  Physical Exam Updated Vital Signs BP 139/78   Pulse (!) 58   Temp 97.6 F (36.4 C) (Oral)   Resp 20   Ht 5' 9"  (1.753 m)    Wt (!) 170.1 kg   SpO2 100%   BMI 55.38 kg/m   Physical Exam Vitals and nursing note reviewed.  Constitutional:      Appearance: He is well-developed.  HENT:     Head: Normocephalic and atraumatic.     Nose: Nose normal. No congestion or rhinorrhea.  Eyes:     Conjunctiva/sclera:     Right eye: Right conjunctiva is injected.     Pupils: Pupils are equal, round, and reactive to light.  Cardiovascular:     Rate and Rhythm: Normal rate.  Pulmonary:     Effort: Pulmonary effort is normal. No respiratory distress.  Abdominal:     General: There is no distension.  Musculoskeletal:        General: Normal range of motion.     Cervical back: Normal range of motion.  Skin:    General: Skin is warm and dry.     Coloration: Skin is not jaundiced or pale.  Neurological:     General: No focal deficit present.     Mental Status: He is alert.    ED Results / Procedures / Treatments   Labs (all labs ordered are listed, but only abnormal results are displayed) Labs Reviewed - No data to display  EKG None  Radiology No results found.  Procedures Procedures   Medications Ordered in ED Medications  tetracaine (PONTOCAINE) 0.5 % ophthalmic solution 2 drop (2 drops Both Eyes Given 03/18/21 0237)  fluorescein ophthalmic strip 1 strip (1 strip Both Eyes Given 03/18/21 0237)  erythromycin ophthalmic ointment (1 application Right Eye Given 03/18/21 0302)    ED Course  I have reviewed the triage vital signs and the nursing notes.  Pertinent labs & imaging results that were available during my care of the patient were reviewed by me and considered in my medical decision making (see chart for details).    MDM Rules/Calculators/A&P                         No obvious abrasion but this history is consistent with the same so we will give antibiotic ointment with Ortho follow-up.  No evidence of traumatic  iritis or open globe.  Patient stable for discharge  Final Clinical Impression(s) / ED  Diagnoses Final diagnoses:  Acute right eye pain    Rx / DC Orders ED Discharge Orders          Ordered    erythromycin ophthalmic ointment  3 times daily        03/18/21 0258             Ermin Parisien, Corene Cornea, MD 03/18/21 [phone]

## 2021-03-18 NOTE — ED Triage Notes (Signed)
Pt states he was struck by a tree branch in the right eye earlier today. Now having draining from eye and pain.

## 2021-03-22 DIAGNOSIS — S0501XD Injury of conjunctiva and corneal abrasion without foreign body, right eye, subsequent encounter: Secondary | ICD-10-CM | POA: Diagnosis not present

## 2021-03-25 ENCOUNTER — Encounter: Payer: Self-pay | Admitting: Internal Medicine

## 2021-03-25 ENCOUNTER — Other Ambulatory Visit: Payer: Self-pay

## 2021-03-25 ENCOUNTER — Ambulatory Visit: Payer: Federal, State, Local not specified - PPO | Admitting: Internal Medicine

## 2021-03-25 VITALS — BP 122/70 | HR 63 | Temp 98.3°F | Ht 69.0 in | Wt 381.0 lb

## 2021-03-25 DIAGNOSIS — Z23 Encounter for immunization: Secondary | ICD-10-CM

## 2021-03-25 DIAGNOSIS — I10 Essential (primary) hypertension: Secondary | ICD-10-CM | POA: Diagnosis not present

## 2021-03-25 DIAGNOSIS — E538 Deficiency of other specified B group vitamins: Secondary | ICD-10-CM

## 2021-03-25 DIAGNOSIS — Z0000 Encounter for general adult medical examination without abnormal findings: Secondary | ICD-10-CM

## 2021-03-25 DIAGNOSIS — E1165 Type 2 diabetes mellitus with hyperglycemia: Secondary | ICD-10-CM

## 2021-03-25 DIAGNOSIS — E559 Vitamin D deficiency, unspecified: Secondary | ICD-10-CM

## 2021-03-25 DIAGNOSIS — E785 Hyperlipidemia, unspecified: Secondary | ICD-10-CM

## 2021-03-25 NOTE — Patient Instructions (Signed)
Ok to double up on the Vitamin D  Please continue all other medications as before, and refills have been done if requested.  Please have the pharmacy call with any other refills you may need.  Please continue your efforts at being more active, low cholesterol diet, and weight control.  Please keep your appointments with your specialists as you may have planned  Please make an Appointment to return for your 1 year visit, or sooner if needed, with Lab testing by Appointment as well, to be done about 3-5 days before at the FIRST FLOOR Lab (so this is for TWO appointments - please see the scheduling desk as you leave)   Due to the ongoing Covid 19 pandemic, our lab now requires an appointment for any labs done at our office.  If you need labs done and do not have an appointment, please call our office ahead of time to schedule before presenting to the lab for your testing.

## 2021-03-25 NOTE — Progress Notes (Signed)
Patient ID: Tyler [name], male   DOB: 1975/09/28, 45 y.o.   MRN: [id number]        Chief Complaint: follow up HTN, HLD and hyperglycemia, low vit d       HPI:  Tyler [name] is a 45 y.o. male here overall doing ok.  Taking Vit D but not sure of the dose.  Due for flu shot shot.  S/p covid infection dec 2021. Pt denies chest pain, increased sob or doe, wheezing, orthopnea, PND, increased LE swelling, palpitations, dizziness or syncope.   Pt denies polydipsia, polyuria, or new focal neuro s/s.   Pt denies fever, wt loss, night sweats, loss of appetite, or other constitutional symptoms  No other new complaints     Wt Readings from Last 3 Encounters:  03/25/21 (!) 381 lb (172.8 kg)  03/18/21 (!) 375 lb (170.1 kg)  01/18/21 (!) 373 lb 12.8 oz (169.6 kg)   BP Readings from Last 3 Encounters:  03/25/21 122/70  03/18/21 139/78  01/18/21 120/70         Past Medical History:  Diagnosis Date   ACHILLES TENDINITIS, BILATERAL 10/16/2008   ALLERGIC RHINITIS 08/13/2009   CARDIOMYOPATHY 04/21/2009   CHF (congestive heart failure) ([place])    CONGESTIVE HEART FAILURE 12/04/2007   CORONARY ARTERY DISEASE 12/04/2007   GERD (gastroesophageal reflux disease) 11/01/2011   HYPERLIPIDEMIA 03/12/2007   HYPERTENSION 03/12/2007   OBESITY 04/21/2009   Plantar fasciitis, bilateral 11/01/2011   SLEEP APNEA 03/12/2007   Past Surgical History:  Procedure Laterality Date   CARDIAC CATHETERIZATION N/A 12/19/2014   Procedure: Left Heart Cath and Coronary Angiography;  Surgeon: Belva Crome, MD;  Location: [facility];  Service: Cardiovascular;  Laterality: N/A;   COLONOSCOPY WITH PROPOFOL N/A 11/19/2020   Procedure: COLONOSCOPY WITH PROPOFOL;  Surgeon: Milus Banister, MD;  Location: [facility];  Service: Endoscopy;  Laterality: N/A;   NO PAST SURGERIES      reports that he has never smoked. He has never used smokeless tobacco. He reports that he does not drink alcohol and does not use drugs. family  history includes Asthma in his brother; Diabetes in his father, maternal grandfather, and mother; Heart attack in his father and mother; Heart disease in his father and mother; Hypertension in an other family member. No Known Allergies Current Outpatient Medications on File Prior to Visit  Medication Sig Dispense Refill   albuterol (PROVENTIL HFA;VENTOLIN HFA) 108 (90 BASE) MCG/ACT inhaler Inhale 2 puffs into the lungs every 6 (six) hours as needed for wheezing. 1 Inhaler 11   amLODipine (NORVASC) 10 MG tablet Take 1 tablet by mouth once daily (Patient taking differently: Take 10 mg by mouth daily.) 90 tablet 3   aspirin EC 81 MG tablet Take 81 mg by mouth daily.     atorvastatin (LIPITOR) 80 MG tablet Take 1 tablet by mouth once daily (Patient taking differently: Take 80 mg by mouth daily.) 90 tablet 3   Blood Glucose Monitoring Suppl (FREESTYLE LITE) DEVI 1 Device by Does not apply route daily. 100 each 3   Blood Glucose Monitoring Suppl (FREESTYLE LITE) w/Device KIT Use daily as directed to test blood glucose. 1 kit 3   budesonide-formoterol (SYMBICORT) 160-4.5 MCG/ACT inhaler INHALE 2 PUFFS BY MOUTH TWICE A DAY (Patient taking differently: Inhale 2 puffs into the lungs 2 (two) times daily as needed (asthma).) 10.2 g 0   carvedilol (COREG) 25 MG tablet TAKE 2 TABLETS BY MOUTH TWICE DAILY WITH  A MEAL . APPOINTMENT REQUIRED FOR FUTURE REFILLS 120 tablet 0   cetirizine (ZYRTEC) 10 MG tablet Take 10 mg by mouth daily.     Cholecalciferol (VITAMIN D) 50 MCG (2000 UT) tablet Take 2,000 Units by mouth daily.     Continuous Blood Gluc Receiver (FREESTYLE LIBRE 2 READER) DEVI USE AS DIRECTED ONCE DAILY 1 each 0   Continuous Blood Gluc Sensor (FREESTYLE LIBRE 2 SENSOR) MISC USE AS DIRECTED EVERY 14 DAYS 6 each 3   ezetimibe (ZETIA) 10 MG tablet Take 1 tablet (10 mg total) by mouth daily. 90 tablet 3   furosemide (LASIX) 20 MG tablet Take 1 tablet by mouth once daily (Patient taking differently: Take 20 mg  by mouth daily.) 90 tablet 3   glipiZIDE (GLUCOTROL) 5 MG tablet Take 0.5 tablets (2.5 mg total) by mouth daily before breakfast. 45 tablet 3   glucose blood (FREESTYLE LITE) test strip Use 1 strip to check blood glucose once daily as directed. 100 each 12   glucose blood (FREESTYLE TEST STRIPS) test strip 1 each by Other route daily. And lancets 1/day 100 each 12   Lancets (FREESTYLE) lancets Use 1 lancet once daily as directed to test blood glucose. 100 each 12   Lancets MISC Use as directed twice per day E11.9 200 each 12   meloxicam (MOBIC) 15 MG tablet Take 1 tablet (15 mg total) by mouth daily with meals 30 tablet 0   metFORMIN (GLUCOPHAGE) 500 MG tablet Take 4 tablets (2,000 mg total) by mouth daily with breakfast. 360 tablet 3   Multiple Vitamins-Minerals (MULTIVITAMIN WITH MINERALS) tablet Take 1 tablet by mouth daily.     neomycin-polymyxin b-dexamethasone (MAXITROL) 3.5-10000-0.1 SUSP Instill 1 drop into right eye 4 times daily until next visit Monday; alternating drops with artificial tears 5 mL 0   nitroGLYCERIN (NITROSTAT) 0.4 MG SL tablet Place 1 tablet (0.4 mg total) under the tongue every 5 (five) minutes as needed for chest pain. 25 tablet 3   PEG-KCl-NaCl-NaSulf-Na Asc-C (PLENVU) 140 g SOLR Take 1 kit by mouth as directed. 3 each 0   PRESCRIPTION MEDICATION Inhale into the lungs at bedtime. CPAP     RYBELSUS 3 MG TABS Take 1 tablet by mouth daily.     sacubitril-valsartan (ENTRESTO) 49-51 MG TAKE 1 TABLET BY MOUTH TWICE DAILY 60 tablet 11   Semaglutide (RYBELSUS) 7 MG TABS Take 7 mg by mouth daily. 90 tablet 3   spironolactone (ALDACTONE) 25 MG tablet Take 1 tablet by mouth once daily (Patient taking differently: Take 25 mg by mouth daily.) 90 tablet 3   No current facility-administered medications on file prior to visit.        ROS:  All others reviewed and negative.  Objective        PE:  BP 122/70 (BP Location: Left Arm, Patient Position: Sitting, Cuff Size: Large)    Pulse 63   Temp 98.3 F (36.8 C) (Oral)   Ht 5' 9" (1.753 m)   Wt (!) 381 lb (172.8 kg)   SpO2 97%   BMI 56.26 kg/m                 Constitutional: Pt appears in NAD               HENT: Head: NCAT.                Right Ear: External ear normal.  Left Ear: External ear normal.                Eyes: . Pupils are equal, round, and reactive to light. Conjunctivae and EOM are normal               Nose: without d/c or deformity               Neck: Neck supple. Gross normal ROM               Cardiovascular: Normal rate and regular rhythm.                 Pulmonary/Chest: Effort normal and breath sounds without rales or wheezing.                Abd:  Soft, NT, ND, + BS, no organomegaly               Neurological: Pt is alert. At baseline orientation, motor grossly intact               Skin: Skin is warm. No rashes, no other new lesions, LE edema - none               Psychiatric: Pt behavior is normal without agitation   Micro: none  Cardiac tracings I have personally interpreted today:  none  Pertinent Radiological findings (summarize): none   Lab Results  Component Value Date   WBC 9.1 08/05/2020   HGB 13.5 08/05/2020   HCT 40.4 08/05/2020   PLT 213.0 08/05/2020   GLUCOSE 97 01/05/2021   CHOL 96 01/05/2021   TRIG 130.0 01/05/2021   HDL 31.00 (L) 01/05/2021   LDLDIRECT 183.6 08/02/2011   LDLCALC 39 01/05/2021   ALT 21 01/05/2021   AST 15 01/05/2021   NA 139 01/05/2021   K 3.9 01/05/2021   CL 104 01/05/2021   CREATININE 1.16 01/05/2021   BUN 20 01/05/2021   CO2 28 01/05/2021   TSH 1.94 08/05/2020   PSA 0.66 08/05/2020   INR 1.2 (H) 12/15/2014   HGBA1C 6.1 (A) 01/11/2021   MICROALBUR <0.7 08/05/2020   Assessment/Plan:  Tyler [name] is a 45 y.o. Other or two or more races [6] Black or African American [2] male with  has a past medical history of ACHILLES TENDINITIS, BILATERAL (10/16/2008), ALLERGIC RHINITIS (08/13/2009), CARDIOMYOPATHY (04/21/2009), CHF  (congestive heart failure) ([place]), CONGESTIVE HEART FAILURE (12/04/2007), CORONARY ARTERY DISEASE (12/04/2007), GERD (gastroesophageal reflux disease) (11/01/2011), HYPERLIPIDEMIA (03/12/2007), HYPERTENSION (03/12/2007), OBESITY (04/21/2009), Plantar fasciitis, bilateral (11/01/2011), and SLEEP APNEA (03/12/2007).  Dyslipidemia Lab Results  Component Value Date   LDLCALC 39 01/05/2021   Stable, pt to continue current statin lipitor 80   Essential hypertension BP Readings from Last 3 Encounters:  03/25/21 122/70  03/18/21 139/78  01/18/21 120/70   Stable, pt to continue medical treatment amlodipine, coreg   Diabetes Lab Results  Component Value Date   HGBA1C 6.1 (A) 01/11/2021   Stable, pt to continue current medical treatment glucotrol, metformin , rybelsus   Vitamin D deficiency Last vitamin D Lab Results  Component Value Date   VD25OH 34.50 01/05/2021   Low normal on low dose Vit D, pt to increase/double oral replacement  Followup: Return in about 1 year (around 03/25/2022).  Cathlean Cower, MD 03/27/2021 7:25 PM [facility]

## 2021-03-27 ENCOUNTER — Encounter: Payer: Self-pay | Admitting: Internal Medicine

## 2021-03-27 NOTE — Assessment & Plan Note (Signed)
BP Readings from Last 3 Encounters:  03/25/21 122/70  03/18/21 139/78  01/18/21 120/70   Stable, pt to continue medical treatment amlodipine, coreg

## 2021-03-27 NOTE — Addendum Note (Signed)
Addended by: [name] on: 03/27/2021 07:28 PM   Modules accepted: Orders

## 2021-03-27 NOTE — Assessment & Plan Note (Signed)
Last vitamin D Lab Results  Component Value Date   VD25OH 34.50 01/05/2021   Low normal on low dose Vit D, pt to increase/double oral replacement

## 2021-03-27 NOTE — Assessment & Plan Note (Signed)
Lab Results  Component Value Date   HGBA1C 6.1 (A) 01/11/2021   Stable, pt to continue current medical treatment glucotrol, metformin , rybelsus

## 2021-03-27 NOTE — Assessment & Plan Note (Signed)
Lab Results  Component Value Date   LDLCALC 39 01/05/2021   Stable, pt to continue current statin lipitor 80

## 2021-04-12 ENCOUNTER — Ambulatory Visit: Payer: Federal, State, Local not specified - PPO | Admitting: Endocrinology

## 2021-04-12 ENCOUNTER — Encounter: Payer: Self-pay | Admitting: Endocrinology

## 2021-04-12 ENCOUNTER — Other Ambulatory Visit (HOSPITAL_COMMUNITY): Payer: Self-pay

## 2021-04-12 ENCOUNTER — Other Ambulatory Visit: Payer: Self-pay

## 2021-04-12 VITALS — BP 116/70 | HR 60 | Ht 69.0 in | Wt 381.8 lb

## 2021-04-12 DIAGNOSIS — E1165 Type 2 diabetes mellitus with hyperglycemia: Secondary | ICD-10-CM | POA: Diagnosis not present

## 2021-04-12 LAB — POCT GLYCOSYLATED HEMOGLOBIN (HGB A1C): Hemoglobin A1C: 6.2 % — AB (ref 4.0–5.6)

## 2021-04-12 MED ORDER — RYBELSUS 14 MG PO TABS
14.0000 mg | ORAL_TABLET | Freq: Every day | ORAL | 3 refills | Status: DC
  Filled 2021-04-12 – 2021-05-12 (×2): qty 30, 30d supply, fill #0
  Filled 2021-06-21: qty 90, 90d supply, fill #1

## 2021-04-12 NOTE — Patient Instructions (Addendum)
check your blood sugar once a day.  vary the time of day when you check, between before the 3 meals, and at bedtime.  also check if you have symptoms of your blood sugar being too high or too low.  please keep a record of the readings and bring it to your next appointment here (or you can bring the meter itself).  You can write it on any piece of paper.  please call us sooner if your blood sugar goes below 70, or if most of your readings are over 200.  If so, we'll increase your medication.  I have sent prescriptions to your pharmacy, to increase the Rybelsus.   Please stop taking the glipizide.  Please continue the same other medications.   Please come back for a follow-up appointment in 3 months.

## 2021-04-12 NOTE — Progress Notes (Signed)
Subjective:    Patient ID: Tyler [name], male    DOB: 28-Aug-1975, 45 y.o.   MRN: [id number]  HPI Pt returns for f/u of diabetes mellitus:  DM type: 2 Dx'ed: 4917 Complications: CAD Therapy: 3 oral meds.   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  Other: he has never been on insulin Interval history: Meter is downloaded today, and the printout is scanned into the record.  There are only 4 cbg's.  All are approx 85.  He takes meds as rx'ed.  pt states he feels well in general.  Past Medical History:  Diagnosis Date   ACHILLES TENDINITIS, BILATERAL 10/16/2008   ALLERGIC RHINITIS 08/13/2009   CARDIOMYOPATHY 04/21/2009   CHF (congestive heart failure) ([place])    CONGESTIVE HEART FAILURE 12/04/2007   CORONARY ARTERY DISEASE 12/04/2007   GERD (gastroesophageal reflux disease) 11/01/2011   HYPERLIPIDEMIA 03/12/2007   HYPERTENSION 03/12/2007   OBESITY 04/21/2009   Plantar fasciitis, bilateral 11/01/2011   SLEEP APNEA 03/12/2007    Past Surgical History:  Procedure Laterality Date   CARDIAC CATHETERIZATION N/A 12/19/2014   Procedure: Left Heart Cath and Coronary Angiography;  Surgeon: Belva Crome, MD;  Location: [facility];  Service: Cardiovascular;  Laterality: N/A;   COLONOSCOPY WITH PROPOFOL N/A 11/19/2020   Procedure: COLONOSCOPY WITH PROPOFOL;  Surgeon: Milus Banister, MD;  Location: [facility];  Service: Endoscopy;  Laterality: N/A;   NO PAST SURGERIES      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[employer] [place]    Employer: [employer]  Tobacco Use   Smoking status: Never   Smokeless tobacco: Never   Tobacco comments:    never  Scientific laboratory technician Use: Never used   Substance and Sexual Activity   Alcohol use: No   Drug use: No   Sexual activity: Not on file  Other Topics Concern   Not on file  Social History Narrative   Not on file   Social Determinants of Health   Financial Resource Strain: Not on file  Food Insecurity: Not on file  Transportation Needs: Not on file  Physical Activity: Not on file  Stress: Not on file  Social Connections: Not on file  Intimate Partner Violence: Not on file    Current Outpatient Medications on File Prior to Visit  Medication Sig Dispense Refill   albuterol (PROVENTIL HFA;VENTOLIN HFA) 108 (90 BASE) MCG/ACT inhaler Inhale 2 puffs into the lungs every 6 (six) hours as needed for wheezing. 1 Inhaler 11   amLODipine (NORVASC) 10 MG tablet Take 1 tablet by mouth once daily (Patient taking differently: Take 10 mg by mouth daily.) 90 tablet 3   aspirin EC 81 MG tablet Take 81 mg by mouth daily.     atorvastatin (LIPITOR) 80 MG tablet Take 1 tablet by mouth once daily (Patient taking differently: Take 80 mg by mouth daily.) 90 tablet 3   Blood Glucose Monitoring Suppl (FREESTYLE LITE) DEVI 1 Device by Does not apply route daily. 100 each 3   Blood Glucose Monitoring Suppl (FREESTYLE LITE) w/Device KIT Use daily as directed to test blood glucose. 1 kit 3   budesonide-formoterol (SYMBICORT)  160-4.5 MCG/ACT inhaler INHALE 2 PUFFS BY MOUTH TWICE A DAY (Patient taking differently: Inhale 2 puffs into the lungs 2 (two) times daily as needed (asthma).) 10.2 g 0   carvedilol (COREG) 25 MG tablet TAKE 2 TABLETS BY MOUTH TWICE DAILY WITH A MEAL . APPOINTMENT REQUIRED FOR FUTURE REFILLS 120 tablet 0   cetirizine (ZYRTEC) 10 MG tablet Take 10 mg by mouth daily.     Cholecalciferol (VITAMIN D) 50 MCG (2000 UT) tablet Take 2,000 Units by mouth daily.     Continuous Blood Gluc Receiver (FREESTYLE LIBRE 2 READER) DEVI USE AS DIRECTED ONCE DAILY 1 each 0   Continuous Blood Gluc Sensor (FREESTYLE LIBRE 2 SENSOR) MISC USE AS DIRECTED  EVERY 14 DAYS 6 each 3   ezetimibe (ZETIA) 10 MG tablet Take 1 tablet (10 mg total) by mouth daily. 90 tablet 3   furosemide (LASIX) 20 MG tablet Take 1 tablet by mouth once daily (Patient taking differently: Take 20 mg by mouth daily.) 90 tablet 3   glucose blood (FREESTYLE LITE) test strip Use 1 strip to check blood glucose once daily as directed. 100 each 12   glucose blood (FREESTYLE TEST STRIPS) test strip 1 each by Other route daily. And lancets 1/day 100 each 12   Lancets (FREESTYLE) lancets Use 1 lancet once daily as directed to test blood glucose. 100 each 12   Lancets MISC Use as directed twice per day E11.9 200 each 12   meloxicam (MOBIC) 15 MG tablet Take 1 tablet (15 mg total) by mouth daily with meals 30 tablet 0   metFORMIN (GLUCOPHAGE) 500 MG tablet Take 4 tablets (2,000 mg total) by mouth daily with breakfast. 360 tablet 3   Multiple Vitamins-Minerals (MULTIVITAMIN WITH MINERALS) tablet Take 1 tablet by mouth daily.     neomycin-polymyxin b-dexamethasone (MAXITROL) 3.5-10000-0.1 SUSP Instill 1 drop into right eye 4 times daily until next visit Monday; alternating drops with artificial tears 5 mL 0   nitroGLYCERIN (NITROSTAT) 0.4 MG SL tablet Place 1 tablet (0.4 mg total) under the tongue every 5 (five) minutes as needed for chest pain. 25 tablet 3   PEG-KCl-NaCl-NaSulf-Na Asc-C (PLENVU) 140 g SOLR Take 1 kit by mouth as directed. 3 each 0   PRESCRIPTION MEDICATION Inhale into the lungs at bedtime. CPAP     sacubitril-valsartan (ENTRESTO) 49-51 MG TAKE 1 TABLET BY MOUTH TWICE DAILY 60 tablet 11   spironolactone (ALDACTONE) 25 MG tablet Take 1 tablet by mouth once daily (Patient taking differently: Take 25 mg by mouth daily.) 90 tablet 3   No current facility-administered medications on file prior to visit.    No Known Allergies  Family History  Problem Relation Age of Onset   Hypertension Other    Heart attack Mother    Heart disease Mother    Diabetes Mother    Heart  attack Father    Heart disease Father    Diabetes Father    Asthma Brother    Diabetes Maternal Grandfather     BP 116/70 (BP Location: Right Arm, Patient Position: Sitting, Cuff Size: Large)   Pulse 60   Ht 5' 9" (1.753 m)   Wt (!) 381 lb 12.8 oz (173.2 kg)   SpO2 98%   BMI 56.38 kg/m    Review of Systems Denies N/V/HB    Objective:   Physical Exam Pulses: dorsalis pedis intact bilat.   MSK: no deformity of the feet CV: 1+ bilat leg edema Skin:  no ulcer on the   feet.  normal color and temp on the feet.   Neuro: sensation is intact to touch on the feet.     Lab Results  Component Value Date   HGBA1C 6.2 (A) 04/12/2021      Assessment & Plan:  Type 2 DM: overcontrolled, for this SU-containing regimen  Patient Instructions  check your blood sugar once a day.  vary the time of day when you check, between before the 3 meals, and at bedtime.  also check if you have symptoms of your blood sugar being too high or too low.  please keep a record of the readings and bring it to your next appointment here (or you can bring the meter itself).  You can write it on any piece of paper.  please call us sooner if your blood sugar goes below 70, or if most of your readings are over 200.  If so, we'll increase your medication.  I have sent prescriptions to your pharmacy, to increase the [place]belsus.   Please stop taking the glipizide.  Please continue the same other medications.   Please come back for a follow-up appointment in 3 months.     

## 2021-04-24 ENCOUNTER — Other Ambulatory Visit: Payer: Self-pay | Admitting: Cardiology

## 2021-05-12 ENCOUNTER — Other Ambulatory Visit (HOSPITAL_COMMUNITY): Payer: Self-pay

## 2021-05-17 DIAGNOSIS — H40013 Open angle with borderline findings, low risk, bilateral: Secondary | ICD-10-CM | POA: Diagnosis not present

## 2021-05-17 DIAGNOSIS — H524 Presbyopia: Secondary | ICD-10-CM | POA: Diagnosis not present

## 2021-06-21 ENCOUNTER — Other Ambulatory Visit (HOSPITAL_COMMUNITY): Payer: Self-pay

## 2021-06-22 ENCOUNTER — Other Ambulatory Visit (HOSPITAL_COMMUNITY): Payer: Self-pay

## 2021-06-22 MED FILL — Sacubitril-Valsartan Tab 49-51 MG: ORAL | 30 days supply | Qty: 60 | Fill #0 | Status: AC

## 2021-06-22 MED FILL — Sacubitril-Valsartan Tab 49-51 MG: ORAL | 5 days supply | Qty: 10 | Fill #0 | Status: CN

## 2021-06-22 MED FILL — Sacubitril-Valsartan Tab 49-51 MG: ORAL | 30 days supply | Qty: 60 | Fill #0 | Status: CN

## 2021-07-08 ENCOUNTER — Encounter: Payer: Self-pay | Admitting: Internal Medicine

## 2021-07-08 DIAGNOSIS — R051 Acute cough: Secondary | ICD-10-CM | POA: Diagnosis not present

## 2021-07-10 ENCOUNTER — Other Ambulatory Visit: Payer: Self-pay | Admitting: Cardiology

## 2021-07-19 ENCOUNTER — Other Ambulatory Visit: Payer: Self-pay

## 2021-07-19 ENCOUNTER — Ambulatory Visit: Payer: Federal, State, Local not specified - PPO | Admitting: Endocrinology

## 2021-07-19 ENCOUNTER — Other Ambulatory Visit (HOSPITAL_COMMUNITY): Payer: Self-pay

## 2021-07-19 VITALS — BP 112/60 | HR 70 | Ht 69.0 in | Wt 386.0 lb

## 2021-07-19 DIAGNOSIS — E1165 Type 2 diabetes mellitus with hyperglycemia: Secondary | ICD-10-CM | POA: Diagnosis not present

## 2021-07-19 LAB — POCT GLYCOSYLATED HEMOGLOBIN (HGB A1C): Hemoglobin A1C: 7.2 % — AB (ref 4.0–5.6)

## 2021-07-19 MED ORDER — DAPAGLIFLOZIN PROPANEDIOL 5 MG PO TABS
5.0000 mg | ORAL_TABLET | Freq: Every day | ORAL | 3 refills | Status: DC
  Filled 2021-07-19: qty 90, 90d supply, fill #0
  Filled 2021-11-29: qty 90, 90d supply, fill #1
  Filled 2022-03-21: qty 90, 90d supply, fill #2

## 2021-07-19 NOTE — Patient Instructions (Addendum)
check your blood sugar once a day.  vary the time of day when you check, between before the 3 meals, and at bedtime.  also check if you have symptoms of your blood sugar being too high or too low.  please keep a record of the readings and bring it to your next appointment here (or you can bring the meter itself).  You can write it on any piece of paper.  please call us sooner if your blood sugar goes below 70, or if most of your readings are over 200.  If so, we'll increase your medication.  I have sent prescriptions to your pharmacy, to add [place].     Please continue the same other medications.   Please come back for a follow-up appointment in 3 months.

## 2021-07-19 NOTE — Progress Notes (Signed)
Subjective:    Patient ID: Tyler [name], male    DOB: September 25, 1975, 46 y.o.   MRN: [id number]  HPI Pt returns for f/u of diabetes mellitus:  DM type: 2 Dx'ed: 8676 Complications: CAD Therapy: 2 oral meds.   DKA: never Severe hypoglycemia: never.   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  Other: he has never been on insulin.   Interval history: He brings his meter with his cbg's which I have reviewed today.  Cbg varies from 102-145. He takes meds as rx'ed.  pt states he feels well in general.   Past Medical History:  Diagnosis Date   ACHILLES TENDINITIS, BILATERAL 10/16/2008   ALLERGIC RHINITIS 08/13/2009   CARDIOMYOPATHY 04/21/2009   CHF (congestive heart failure) ([place])    CONGESTIVE HEART FAILURE 12/04/2007   CORONARY ARTERY DISEASE 12/04/2007   GERD (gastroesophageal reflux disease) 11/01/2011   HYPERLIPIDEMIA 03/12/2007   HYPERTENSION 03/12/2007   OBESITY 04/21/2009   Plantar fasciitis, bilateral 11/01/2011   SLEEP APNEA 03/12/2007    Past Surgical History:  Procedure Laterality Date   CARDIAC CATHETERIZATION N/A 12/19/2014   Procedure: Left Heart Cath and Coronary Angiography;  Surgeon: Belva Crome, MD;  Location: [facility];  Service: Cardiovascular;  Laterality: N/A;   COLONOSCOPY WITH PROPOFOL N/A 11/19/2020   Procedure: COLONOSCOPY WITH PROPOFOL;  Surgeon: Milus Banister, MD;  Location: [facility];  Service: Endoscopy;  Laterality: N/A;   NO PAST SURGERIES      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[employer] [place]    Employer: [employer]  Tobacco Use   Smoking status: Never   Smokeless tobacco: Never   Tobacco comments:    never  Scientific laboratory technician Use: Never used  Substance and Sexual Activity    Alcohol use: No   Drug use: No   Sexual activity: Not on file  Other Topics Concern   Not on file  Social History Narrative   Not on file   Social Determinants of Health   Financial Resource Strain: Not on file  Food Insecurity: Not on file  Transportation Needs: Not on file  Physical Activity: Not on file  Stress: Not on file  Social Connections: Not on file  Intimate Partner Violence: Not on file    Current Outpatient Medications on File Prior to Visit  Medication Sig Dispense Refill   albuterol (PROVENTIL HFA;VENTOLIN HFA) 108 (90 BASE) MCG/ACT inhaler Inhale 2 puffs into the lungs every 6 (six) hours as needed for wheezing. 1 Inhaler 11   amLODipine (NORVASC) 10 MG tablet Take 1 tablet by mouth once daily (Patient taking differently: Take 10 mg by mouth daily.) 90 tablet 3   aspirin EC 81 MG tablet Take 81 mg by mouth daily.     atorvastatin (LIPITOR) 80 MG tablet Take 1 tablet by mouth once daily (Patient taking differently: Take 80 mg by mouth daily.) 90 tablet 3   Blood Glucose Monitoring Suppl (FREESTYLE LITE) DEVI 1 Device by Does not apply route daily. 100 each 3   Blood Glucose Monitoring Suppl (FREESTYLE LITE) w/Device KIT Use daily as directed to test blood glucose. 1 kit 3   carvedilol (COREG) 25 MG  tablet TAKE 2 TABLETS BY MOUTH TWICE DAILY WITH A MEAL . APPOINTMENT REQUIRED FOR FUTURE REFILLS 120 tablet 0   cetirizine (ZYRTEC) 10 MG tablet Take 10 mg by mouth daily.     Cholecalciferol (VITAMIN D) 50 MCG (2000 UT) tablet Take 2,000 Units by mouth daily.     Continuous Blood Gluc Receiver (FREESTYLE LIBRE 2 READER) DEVI USE AS DIRECTED ONCE DAILY 1 each 0   Continuous Blood Gluc Sensor (FREESTYLE LIBRE 2 SENSOR) MISC USE AS DIRECTED EVERY 14 DAYS 6 each 3   ezetimibe (ZETIA) 10 MG tablet Take 1 tablet (10 mg total) by mouth daily. 90 tablet 3   furosemide (LASIX) 20 MG tablet Take 1 tablet by mouth once daily (Patient taking differently: Take 20 mg by mouth daily.)  90 tablet 3   glucose blood (FREESTYLE LITE) test strip Use 1 strip to check blood glucose once daily as directed. 100 each 12   glucose blood (FREESTYLE TEST STRIPS) test strip 1 each by Other route daily. And lancets 1/day 100 each 12   Lancets (FREESTYLE) lancets Use 1 lancet once daily as directed to test blood glucose. 100 each 12   Lancets MISC Use as directed twice per day E11.9 200 each 12   meloxicam (MOBIC) 15 MG tablet Take 1 tablet (15 mg total) by mouth daily with meals 30 tablet 0   metFORMIN (GLUCOPHAGE) 500 MG tablet Take 4 tablets (2,000 mg total) by mouth daily with breakfast. 360 tablet 3   Multiple Vitamins-Minerals (MULTIVITAMIN WITH MINERALS) tablet Take 1 tablet by mouth daily.     neomycin-polymyxin b-dexamethasone (MAXITROL) 3.5-10000-0.1 SUSP Instill 1 drop into right eye 4 times daily until next visit Monday; alternating drops with artificial tears 5 mL 0   nitroGLYCERIN (NITROSTAT) 0.4 MG SL tablet Place 1 tablet (0.4 mg total) under the tongue every 5 (five) minutes as needed for chest pain. 25 tablet 3   PEG-KCl-NaCl-NaSulf-Na Asc-C (PLENVU) 140 g SOLR Take 1 kit by mouth as directed. 3 each 0   PRESCRIPTION MEDICATION Inhale into the lungs at bedtime. CPAP     sacubitril-valsartan (ENTRESTO) 49-51 MG TAKE 1 TABLET BY MOUTH TWICE DAILY 60 tablet 11   Semaglutide (RYBELSUS) 14 MG TABS Take 1 tablet by mouth once daily 90 tablet 3   spironolactone (ALDACTONE) 25 MG tablet Take 1 tablet by mouth once daily (Patient taking differently: Take 25 mg by mouth daily.) 90 tablet 3   budesonide-formoterol (SYMBICORT) 160-4.5 MCG/ACT inhaler INHALE 2 PUFFS BY MOUTH TWICE A DAY (Patient taking differently: Inhale 2 puffs into the lungs 2 (two) times daily as needed (asthma).) 10.2 g 0   No current facility-administered medications on file prior to visit.    No Known Allergies  Family History  Problem Relation Age of Onset   Hypertension Other    Heart attack Mother    Heart  disease Mother    Diabetes Mother    Heart attack Father    Heart disease Father    Diabetes Father    Asthma Brother    Diabetes Maternal Grandfather     BP 112/60    Pulse 70    Ht 5' 9"  (1.753 m)    Wt (!) 386 lb (175.1 kg)    SpO2 95%    BMI 57.00 kg/m    Review of Systems Denies N/V/HB.      Objective:   Physical Exam   Lab Results  Component Value Date   HGBA1C 7.2 (A) 07/19/2021  Lab Results  Component Value Date   CREATININE 1.16 01/05/2021   BUN 20 01/05/2021   NA 139 01/05/2021   K 3.9 01/05/2021   CL 104 01/05/2021   CO2 28 01/05/2021      Assessment & Plan:  Type 2 DM: uncontrolled  Patient Instructions  check your blood sugar once a day.  vary the time of day when you check, between before the 3 meals, and at bedtime.  also check if you have symptoms of your blood sugar being too high or too low.  please keep a record of the readings and bring it to your next appointment here (or you can bring the meter itself).  You can write it on any piece of paper.  please call us sooner if your blood sugar goes below 70, or if most of your readings are over 200.  If so, we'll increase your medication.  I have sent prescriptions to your pharmacy, to add [place].     Please continue the same other medications.   Please come back for a follow-up appointment in 3 months.

## 2021-07-20 ENCOUNTER — Other Ambulatory Visit (HOSPITAL_COMMUNITY): Payer: Self-pay

## 2021-07-21 ENCOUNTER — Other Ambulatory Visit (HOSPITAL_COMMUNITY): Payer: Self-pay

## 2021-08-08 MED FILL — Sacubitril-Valsartan Tab 49-51 MG: ORAL | 30 days supply | Qty: 60 | Fill #1 | Status: AC

## 2021-08-09 ENCOUNTER — Other Ambulatory Visit (HOSPITAL_COMMUNITY): Payer: Self-pay

## 2021-08-12 ENCOUNTER — Encounter: Payer: Federal, State, Local not specified - PPO | Admitting: Internal Medicine

## 2021-08-14 ENCOUNTER — Other Ambulatory Visit (HOSPITAL_COMMUNITY): Payer: Self-pay

## 2021-08-15 ENCOUNTER — Other Ambulatory Visit: Payer: Self-pay | Admitting: Cardiology

## 2021-08-18 ENCOUNTER — Encounter: Payer: Self-pay | Admitting: Internal Medicine

## 2021-08-18 ENCOUNTER — Other Ambulatory Visit: Payer: Self-pay

## 2021-08-18 ENCOUNTER — Ambulatory Visit (INDEPENDENT_AMBULATORY_CARE_PROVIDER_SITE_OTHER): Payer: Federal, State, Local not specified - PPO | Admitting: Internal Medicine

## 2021-08-18 VITALS — BP 130/62 | HR 67 | Resp 18 | Ht 69.0 in | Wt 381.2 lb

## 2021-08-18 DIAGNOSIS — E559 Vitamin D deficiency, unspecified: Secondary | ICD-10-CM | POA: Diagnosis not present

## 2021-08-18 DIAGNOSIS — Z0000 Encounter for general adult medical examination without abnormal findings: Secondary | ICD-10-CM

## 2021-08-18 DIAGNOSIS — Z125 Encounter for screening for malignant neoplasm of prostate: Secondary | ICD-10-CM | POA: Diagnosis not present

## 2021-08-18 DIAGNOSIS — E1165 Type 2 diabetes mellitus with hyperglycemia: Secondary | ICD-10-CM | POA: Diagnosis not present

## 2021-08-18 DIAGNOSIS — Z0001 Encounter for general adult medical examination with abnormal findings: Secondary | ICD-10-CM

## 2021-08-18 DIAGNOSIS — E785 Hyperlipidemia, unspecified: Secondary | ICD-10-CM | POA: Diagnosis not present

## 2021-08-18 DIAGNOSIS — I10 Essential (primary) hypertension: Secondary | ICD-10-CM

## 2021-08-18 DIAGNOSIS — E538 Deficiency of other specified B group vitamins: Secondary | ICD-10-CM | POA: Diagnosis not present

## 2021-08-18 LAB — CBC WITH DIFFERENTIAL/PLATELET
Basophils Absolute: 0.1 10*3/uL (ref 0.0–0.1)
Basophils Relative: 0.6 % (ref 0.0–3.0)
Eosinophils Absolute: 0.5 10*3/uL (ref 0.0–0.7)
Eosinophils Relative: 6.2 % — ABNORMAL HIGH (ref 0.0–5.0)
HCT: 41.6 % (ref 39.0–52.0)
Hemoglobin: 13.6 g/dL (ref 13.0–17.0)
Lymphocytes Relative: 31.7 % (ref 12.0–46.0)
Lymphs Abs: 2.7 10*3/uL (ref 0.7–4.0)
MCHC: 32.8 g/dL (ref 30.0–36.0)
MCV: 87.5 fl (ref 78.0–100.0)
Monocytes Absolute: 0.8 10*3/uL (ref 0.1–1.0)
Monocytes Relative: 9 % (ref 3.0–12.0)
Neutro Abs: 4.5 10*3/uL (ref 1.4–7.7)
Neutrophils Relative %: 52.5 % (ref 43.0–77.0)
Platelets: 225 10*3/uL (ref 150.0–400.0)
RBC: 4.75 Mil/uL (ref 4.22–5.81)
RDW: 14.4 % (ref 11.5–15.5)
WBC: 8.6 10*3/uL (ref 4.0–10.5)

## 2021-08-18 LAB — LIPID PANEL
Cholesterol: 96 mg/dL (ref 0–200)
HDL: 30.8 mg/dL — ABNORMAL LOW (ref >39.00)
LDL Cholesterol: 46 mg/dL (ref 0–99)
NonHDL: 65.38
Total CHOL/HDL Ratio: 3
Triglycerides: 96 mg/dL (ref 0.0–149.0)
VLDL: 19.2 mg/dL (ref 0.0–40.0)

## 2021-08-18 LAB — HEPATIC FUNCTION PANEL
ALT: 23 U/L (ref 0–53)
AST: 17 U/L (ref 0–37)
Albumin: 4.4 g/dL (ref 3.5–5.2)
Alkaline Phosphatase: 72 U/L (ref 39–117)
Bilirubin, Direct: 0.1 mg/dL (ref 0.0–0.3)
Total Bilirubin: 0.4 mg/dL (ref 0.2–1.2)
Total Protein: 7.2 g/dL (ref 6.0–8.3)

## 2021-08-18 LAB — BASIC METABOLIC PANEL
BUN: 16 mg/dL (ref 6–23)
CO2: 27 mEq/L (ref 19–32)
Calcium: 9 mg/dL (ref 8.4–10.5)
Chloride: 104 mEq/L (ref 96–112)
Creatinine, Ser: 1.08 mg/dL (ref 0.40–1.50)
GFR: 81.61 mL/min (ref >60.00)
Glucose, Bld: 102 mg/dL — ABNORMAL HIGH (ref 70–99)
Potassium: 4 mEq/L (ref 3.5–5.1)
Sodium: 139 mEq/L (ref 135–145)

## 2021-08-18 LAB — MICROALBUMIN / CREATININE URINE RATIO
Creatinine,U: 256.3 mg/dL
Microalb Creat Ratio: 0.4 mg/g (ref 0.0–30.0)
Microalb, Ur: 1 mg/dL (ref 0.0–1.9)

## 2021-08-18 LAB — TSH: TSH: 2.01 u[IU]/mL (ref 0.35–5.50)

## 2021-08-18 LAB — PSA: PSA: 0.64 ng/mL (ref 0.10–4.00)

## 2021-08-18 LAB — VITAMIN B12: Vitamin B-12: 491 pg/mL (ref 211–911)

## 2021-08-18 LAB — VITAMIN D 25 HYDROXY (VIT D DEFICIENCY, FRACTURES): VITD: 36.5 ng/mL (ref 30.00–100.00)

## 2021-08-18 NOTE — Patient Instructions (Addendum)

## 2021-08-18 NOTE — Progress Notes (Signed)
Patient ID: Tyler [name], male   DOB: Jan 18, 1976, 46 y.o.   MRN: [id number]         Chief Complaint:: wellness exam and low vit d, dm, hld, htn       HPI:  Tyler [name] is a 46 y.o. male here for wellness exam; declines covid booster, ow up to date                        Also Pt denies chest pain, increased sob or doe, wheezing, orthopnea, PND, increased LE swelling, palpitations, dizziness or syncope.   Pt denies polydipsia, polyuria, or new focal neuro s/s.    Pt denies fever, wt loss, night sweats, loss of appetite, or other constitutional symptoms  Cont's to f/u with endo for sugar.     Wt Readings from Last 3 Encounters:  08/18/21 (!) 381 lb 3.2 oz (172.9 kg)  07/19/21 (!) 386 lb (175.1 kg)  04/12/21 (!) 381 lb 12.8 oz (173.2 kg)   BP Readings from Last 3 Encounters:  08/18/21 130/62  07/19/21 112/60  04/12/21 116/70   Immunization History  Administered Date(s) Administered   Influenza Whole 04/01/2009, 03/09/2010   Influenza,inj,Quad PF,6+ Mos 04/27/2013, 02/11/2014, 06/09/2015, 04/05/2016, 07/18/2017, 06/22/2018, 04/06/2019, 03/25/2021   PFIZER(Purple Top)SARS-COV-2 Vaccination 08/29/2019, 09/19/2019, 05/12/2020   Pneumococcal Polysaccharide-23 10/21/2014   Td 10/16/2008   Tdap 08/28/2018   There are no preventive care reminders to display for this patient.     Past Medical History:  Diagnosis Date   ACHILLES TENDINITIS, BILATERAL 10/16/2008   ALLERGIC RHINITIS 08/13/2009   CARDIOMYOPATHY 04/21/2009   CHF (congestive heart failure) ([place])    CONGESTIVE HEART FAILURE 12/04/2007   CORONARY ARTERY DISEASE 12/04/2007   GERD (gastroesophageal reflux disease) 11/01/2011   HYPERLIPIDEMIA 03/12/2007   HYPERTENSION 03/12/2007   OBESITY 04/21/2009   Plantar fasciitis, bilateral 11/01/2011   SLEEP APNEA 03/12/2007   Past Surgical History:  Procedure Laterality Date   CARDIAC CATHETERIZATION N/A 12/19/2014   Procedure: Left Heart Cath and Coronary Angiography;  Surgeon:  Belva Crome, MD;  Location: [facility];  Service: Cardiovascular;  Laterality: N/A;   COLONOSCOPY WITH PROPOFOL N/A 11/19/2020   Procedure: COLONOSCOPY WITH PROPOFOL;  Surgeon: Milus Banister, MD;  Location: [facility];  Service: Endoscopy;  Laterality: N/A;   NO PAST SURGERIES      reports that he has never smoked. He has never used smokeless tobacco. He reports that he does not drink alcohol and does not use drugs. family history includes Asthma in his brother; Diabetes in his father, maternal grandfather, and mother; Heart attack in his father and mother; Heart disease in his father and mother; Hypertension in an other family member. No Known Allergies Current Outpatient Medications on File Prior to Visit  Medication Sig Dispense Refill   albuterol (PROVENTIL HFA;VENTOLIN HFA) 108 (90 BASE) MCG/ACT inhaler Inhale 2 puffs into the lungs every 6 (six) hours as needed for wheezing. 1 Inhaler 11   amLODipine (NORVASC) 10 MG tablet Take 1 tablet by mouth once daily (Patient taking differently: Take 10 mg by mouth daily.) 90 tablet 3   aspirin EC 81 MG tablet Take 81 mg by mouth daily.     atorvastatin (LIPITOR) 80 MG tablet Take 1 tablet by mouth once daily (Patient taking differently: Take 80 mg by mouth daily.) 90 tablet 3   Blood Glucose Monitoring Suppl (FREESTYLE LITE) DEVI 1 Device by Does not apply route daily.  100 each 3   Blood Glucose Monitoring Suppl (FREESTYLE LITE) w/Device KIT Use daily as directed to test blood glucose. 1 kit 3   carvedilol (COREG) 25 MG tablet Take 1 tablet (25 mg total) by mouth 2 (two) times daily. PATIENT MUST MAKE APPOINTMENT FOR FUTURE REFILLS. SECOND ATTEMPT. 30 tablet 0   cetirizine (ZYRTEC) 10 MG tablet Take 10 mg by mouth daily.     Cholecalciferol (VITAMIN D) 50 MCG (2000 UT) tablet Take 2,000 Units by mouth daily.     Continuous Blood Gluc Receiver (FREESTYLE LIBRE 2 READER) DEVI USE AS DIRECTED ONCE DAILY 1 each 0   Continuous Blood Gluc  Sensor (FREESTYLE LIBRE 2 SENSOR) MISC USE AS DIRECTED EVERY 14 DAYS 6 each 3   dapagliflozin propanediol (FARXIGA) 5 MG TABS tablet Take 1 tablet (5 mg total) by mouth daily before breakfast. 90 tablet 3   ezetimibe (ZETIA) 10 MG tablet Take 1 tablet (10 mg total) by mouth daily. 90 tablet 3   furosemide (LASIX) 20 MG tablet Take 1 tablet by mouth once daily (Patient taking differently: Take 20 mg by mouth daily.) 90 tablet 3   glucose blood (FREESTYLE LITE) test strip Use 1 strip to check blood glucose once daily as directed. 100 each 12   glucose blood (FREESTYLE TEST STRIPS) test strip 1 each by Other route daily. And lancets 1/day 100 each 12   Lancets (FREESTYLE) lancets Use 1 lancet once daily as directed to test blood glucose. 100 each 12   Lancets MISC Use as directed twice per day E11.9 200 each 12   meloxicam (MOBIC) 15 MG tablet Take 1 tablet (15 mg total) by mouth daily with meals 30 tablet 0   metFORMIN (GLUCOPHAGE) 500 MG tablet Take 4 tablets (2,000 mg total) by mouth daily with breakfast. 360 tablet 3   Multiple Vitamins-Minerals (MULTIVITAMIN WITH MINERALS) tablet Take 1 tablet by mouth daily.     neomycin-polymyxin b-dexamethasone (MAXITROL) 3.5-10000-0.1 SUSP Instill 1 drop into right eye 4 times daily until next visit Monday; alternating drops with artificial tears 5 mL 0   nitroGLYCERIN (NITROSTAT) 0.4 MG SL tablet Place 1 tablet (0.4 mg total) under the tongue every 5 (five) minutes as needed for chest pain. 25 tablet 3   PEG-KCl-NaCl-NaSulf-Na Asc-C (PLENVU) 140 g SOLR Take 1 kit by mouth as directed. 3 each 0   PRESCRIPTION MEDICATION Inhale into the lungs at bedtime. CPAP     sacubitril-valsartan (ENTRESTO) 49-51 MG TAKE 1 TABLET BY MOUTH TWICE DAILY 60 tablet 11   Semaglutide (RYBELSUS) 14 MG TABS Take 1 tablet by mouth once daily 90 tablet 3   spironolactone (ALDACTONE) 25 MG tablet Take 1 tablet by mouth once daily (Patient taking differently: Take 25 mg by mouth daily.)  90 tablet 3   budesonide-formoterol (SYMBICORT) 160-4.5 MCG/ACT inhaler INHALE 2 PUFFS BY MOUTH TWICE A DAY (Patient taking differently: Inhale 2 puffs into the lungs 2 (two) times daily as needed (asthma).) 10.2 g 0   No current facility-administered medications on file prior to visit.        ROS:  All others reviewed and negative.  Objective        PE:  BP 130/62    Pulse 67    Resp 18    Ht _0  (1.753 m)    Wt (!) 381 lb 3.2 oz (172.9 kg)    SpO2 97%    BMI 56.29 kg/m  Constitutional: Pt appears in NAD               HENT: Head: NCAT.                Right Ear: External ear normal.                 Left Ear: External ear normal.                Eyes: . Pupils are equal, round, and reactive to light. Conjunctivae and EOM are normal               Nose: without d/c or deformity               Neck: Neck supple. Gross normal ROM               Cardiovascular: Normal rate and regular rhythm.                 Pulmonary/Chest: Effort normal and breath sounds without rales or wheezing.                Abd:  Soft, NT, ND, + BS, no organomegaly               Neurological: Pt is alert. At baseline orientation, motor grossly intact               Skin: Skin is warm. No rashes, no other new lesions, LE edema - none               Psychiatric: Pt behavior is normal without agitation   Micro: none  Cardiac tracings I have personally interpreted today:  none  Pertinent Radiological findings (summarize): none   Lab Results  Component Value Date   WBC 8.6 08/18/2021   HGB 13.6 08/18/2021   HCT 41.6 08/18/2021   PLT 225.0 08/18/2021   GLUCOSE 102 (H) 08/18/2021   CHOL 96 08/18/2021   TRIG 96.0 08/18/2021   HDL 30.80 (L) 08/18/2021   LDLDIRECT 183.6 08/02/2011   LDLCALC 46 08/18/2021   ALT 23 08/18/2021   AST 17 08/18/2021   NA 139 08/18/2021   K 4.0 08/18/2021   CL 104 08/18/2021   CREATININE 1.08 08/18/2021   BUN 16 08/18/2021   CO2 27 08/18/2021   TSH 2.01 08/18/2021    PSA 0.64 08/18/2021   INR 1.2 (H) 12/15/2014   HGBA1C 7.2 (A) 07/19/2021   MICROALBUR 1.0 08/18/2021   Assessment/Plan:  Tyler [name] is a 46 y.o. Other or two or more races [6] Black or African American [2] male with  has a past medical history of ACHILLES TENDINITIS, BILATERAL (10/16/2008), ALLERGIC RHINITIS (08/13/2009), CARDIOMYOPATHY (04/21/2009), CHF (congestive heart failure) ([place]), CONGESTIVE HEART FAILURE (12/04/2007), CORONARY ARTERY DISEASE (12/04/2007), GERD (gastroesophageal reflux disease) (11/01/2011), HYPERLIPIDEMIA (03/12/2007), HYPERTENSION (03/12/2007), OBESITY (04/21/2009), Plantar fasciitis, bilateral (11/01/2011), and SLEEP APNEA (03/12/2007).  Vitamin D deficiency Last vitamin D Lab Results  Component Value Date   VD25OH 34.50 01/05/2021   Low, to start oral replacement   Encounter for well adult exam with abnormal findings Age and sex appropriate education and counseling updated with regular exercise and diet Referrals for preventative services - none needed Immunizations addressed - declines covid booster Smoking counseling  - none needed Evidence for depression or other mood disorder - none significant Most recent labs reviewed. I have personally reviewed and have noted: 1) the patient's medical and social history 2) The patient's current medications and supplements 3)  The patient's height, weight, and BMI have been recorded in the chart   Diabetes Lab Results  Component Value Date   HGBA1C 7.2 (A) 07/19/2021   Stable, pt to continue current medical treatment farxiga, metformin, rybelsus   Dyslipidemia Lab Results  Component Value Date   LDLCALC 46 08/18/2021   Stable, pt to continue current statin lipitor 80   Essential hypertension BP Readings from Last 3 Encounters:  08/18/21 130/62  07/19/21 112/60  04/12/21 116/70   Stable, pt to continue medical treatment norvasc  Followup: Return in about 6 months (around 02/18/2022).  Cathlean Cower, MD  08/19/2021 8:05 PM [facility]

## 2021-08-18 NOTE — Assessment & Plan Note (Signed)
Last vitamin D ?Lab Results  ?Component Value Date  ? VD25OH 34.50 01/05/2021  ? ?Low, to start oral replacement ? ?

## 2021-08-19 ENCOUNTER — Encounter: Payer: Self-pay | Admitting: Internal Medicine

## 2021-08-19 LAB — URINALYSIS, ROUTINE W REFLEX MICROSCOPIC
Bilirubin Urine: NEGATIVE
Ketones, ur: NEGATIVE
Leukocytes,Ua: NEGATIVE
Nitrite: NEGATIVE
Specific Gravity, Urine: >=1.03 — AB (ref 1.000–1.030)
Total Protein, Urine: NEGATIVE
Urine Glucose: 500 — AB
Urobilinogen, UA: 0.2 (ref 0.0–1.0)
pH: 6 (ref 5.0–8.0)

## 2021-08-19 NOTE — Assessment & Plan Note (Signed)

## 2021-08-19 NOTE — Assessment & Plan Note (Signed)
BP Readings from Last 3 Encounters:  ?08/18/21 130/62  ?07/19/21 112/60  ?04/12/21 116/70  ? ?Stable, pt to continue medical treatment norvasc ? ?

## 2021-08-19 NOTE — Assessment & Plan Note (Signed)
Lab Results  ?Component Value Date  ? LDLCALC 46 08/18/2021  ? ?Stable, pt to continue current statin lipitor 80 ? ?

## 2021-08-19 NOTE — Assessment & Plan Note (Signed)
Lab Results  ?Component Value Date  ? HGBA1C 7.2 (A) 07/19/2021  ? ?Stable, pt to continue current medical treatment farxiga, metformin, rybelsus ? ?

## 2021-08-22 ENCOUNTER — Encounter: Payer: Self-pay | Admitting: Internal Medicine

## 2021-08-22 DIAGNOSIS — G4733 Obstructive sleep apnea (adult) (pediatric): Secondary | ICD-10-CM

## 2021-08-30 ENCOUNTER — Encounter: Payer: Self-pay | Admitting: Internal Medicine

## 2021-08-30 ENCOUNTER — Other Ambulatory Visit: Payer: Self-pay | Admitting: Cardiology

## 2021-08-30 ENCOUNTER — Other Ambulatory Visit (HOSPITAL_COMMUNITY): Payer: Self-pay

## 2021-08-30 MED ORDER — CARVEDILOL 25 MG PO TABS: 25.0000 mg | ORAL_TABLET | Freq: Two times a day (BID) | ORAL | 0 refills | Status: DC

## 2021-09-14 ENCOUNTER — Other Ambulatory Visit (HOSPITAL_COMMUNITY): Payer: Self-pay

## 2021-09-14 DIAGNOSIS — G4733 Obstructive sleep apnea (adult) (pediatric): Secondary | ICD-10-CM | POA: Diagnosis not present

## 2021-10-09 ENCOUNTER — Other Ambulatory Visit: Payer: Self-pay | Admitting: Cardiology

## 2021-10-09 DIAGNOSIS — I428 Other cardiomyopathies: Secondary | ICD-10-CM

## 2021-10-11 ENCOUNTER — Other Ambulatory Visit (HOSPITAL_COMMUNITY): Payer: Self-pay

## 2021-10-13 ENCOUNTER — Encounter: Payer: Self-pay | Admitting: Cardiology

## 2021-10-13 ENCOUNTER — Other Ambulatory Visit (HOSPITAL_COMMUNITY): Payer: Self-pay

## 2021-10-13 MED ORDER — ENTRESTO 49-51 MG PO TABS
1.0000 | ORAL_TABLET | Freq: Two times a day (BID) | ORAL | 3 refills | Status: DC
Start: 2021-10-13 — End: 2022-06-02
  Filled 2021-10-13 (×2): qty 60, 30d supply, fill #0
  Filled 2022-01-04: qty 60, 30d supply, fill #1
  Filled 2022-02-05: qty 60, 30d supply, fill #2
  Filled 2022-03-21: qty 60, 30d supply, fill #3

## 2021-10-13 MED ORDER — ENTRESTO 49-51 MG PO TABS: 1.0000 | ORAL_TABLET | Freq: Two times a day (BID) | ORAL | 3 refills | Status: DC

## 2021-10-15 DIAGNOSIS — G4733 Obstructive sleep apnea (adult) (pediatric): Secondary | ICD-10-CM | POA: Diagnosis not present

## 2021-10-18 ENCOUNTER — Other Ambulatory Visit: Payer: Self-pay | Admitting: Internal Medicine

## 2021-10-18 ENCOUNTER — Encounter: Payer: Self-pay | Admitting: Endocrinology

## 2021-10-18 ENCOUNTER — Ambulatory Visit: Payer: Federal, State, Local not specified - PPO | Admitting: Endocrinology

## 2021-10-18 VITALS — BP 112/74 | HR 70 | Ht 69.0 in | Wt 382.4 lb

## 2021-10-18 DIAGNOSIS — E1165 Type 2 diabetes mellitus with hyperglycemia: Secondary | ICD-10-CM | POA: Diagnosis not present

## 2021-10-18 LAB — POCT GLYCOSYLATED HEMOGLOBIN (HGB A1C): Hemoglobin A1C: 6.9 % — AB (ref 4.0–5.6)

## 2021-10-18 MED ORDER — METFORMIN HCL ER 500 MG PO TB24: 2000.0000 mg | ORAL_TABLET | Freq: Every day | ORAL | 1 refills | Status: DC

## 2021-10-18 NOTE — Progress Notes (Signed)
? ?Subjective:  ? ? Patient ID: Tyler [name], male    DOB: 04-Mar-1976, 46 y.o.   MRN: [id number] ? ?HPI ?Pt returns for f/u of diabetes mellitus:  ?DM type: 2 ?Dx'ed: 2013 ?Complications: CAD ?Therapy: 2 oral meds.   ?DKA: never ?Severe hypoglycemia: never.   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  ?Other: he has never been on insulin.   ?Interval history: Meter is downloaded today, and the printout is scanned into the record.  CBG varies from 90-116.  He takes meds as rx'ed.  pt states he feels well in general.  He says [place] causes diarrhea.   ?Past Medical History:  ?Diagnosis Date  ? ACHILLES TENDINITIS, BILATERAL 10/16/2008  ? ALLERGIC RHINITIS 08/13/2009  ? CARDIOMYOPATHY 04/21/2009  ? CHF (congestive heart failure) ([place])   ? CONGESTIVE HEART FAILURE 12/04/2007  ? CORONARY ARTERY DISEASE 12/04/2007  ? GERD (gastroesophageal reflux disease) 11/01/2011  ? HYPERLIPIDEMIA 03/12/2007  ? HYPERTENSION 03/12/2007  ? OBESITY 04/21/2009  ? Plantar fasciitis, bilateral 11/01/2011  ? SLEEP APNEA 03/12/2007  ? ? ?Past Surgical History:  ?Procedure Laterality Date  ? CARDIAC CATHETERIZATION N/A 12/19/2014  ? Procedure: Left Heart Cath and Coronary Angiography;  Surgeon: Belva Crome, MD;  Location: [facility];  Service: Cardiovascular;  Laterality: N/A;  ? COLONOSCOPY WITH PROPOFOL N/A 11/19/2020  ? Procedure: COLONOSCOPY WITH PROPOFOL;  Surgeon: Milus Banister, MD;  Location: [facility];  Service: Endoscopy;  Laterality: N/A;  ? NO PAST SURGERIES    ? ? ?Social History  ? ?Socioeconomic History  ? Marital status: Married  ?  Spouse name: Not on file  ? Number of children: 3  ? Years of education: Not on file  ? Highest education level: Not on file  ?Occupational History  ? Occupation: [employer]  ?  Employer: [employer]  ?Tobacco Use  ? Smoking status: Never  ? Smokeless tobacco: Never  ? Tobacco comments:  ?  never  ?Vaping Use  ? Vaping Use:  Never used  ?Substance and Sexual Activity  ? Alcohol use: No  ? Drug use: No  ? Sexual activity: Not on file  ?Other Topics Concern  ? Not on file  ?Social History Narrative  ? Not on file  ? ?Social Determinants of Health  ? ?Financial Resource Strain: Not on file  ?Food Insecurity: Not on file  ?Transportation Needs: Not on file  ?Physical Activity: Not on file  ?Stress: Not on file  ?Social Connections: Not on file  ?Intimate Partner Violence: Not on file  ? ? ?Current Outpatient Medications on File Prior to Visit  ?Medication Sig Dispense Refill  ? albuterol (PROVENTIL HFA;VENTOLIN HFA) 108 (90 BASE) MCG/ACT inhaler Inhale 2 puffs into the lungs every 6 (six) hours as needed for wheezing. 1 Inhaler 11  ? amLODipine (NORVASC) 10 MG tablet Take 1 tablet by mouth once daily (Patient taking differently: Take 10 mg by mouth daily.) 90 tablet 3  ? aspirin EC 81 MG tablet Take 81 mg by mouth daily.    ? atorvastatin (LIPITOR) 80 MG tablet Take 1 tablet by mouth once daily (Patient taking differently: Take 80 mg by mouth daily.) 90 tablet 3  ? Blood Glucose Monitoring Suppl (FREESTYLE LITE) DEVI 1 Device by Does not apply route daily. 100 each 3  ? Blood Glucose Monitoring Suppl (FREESTYLE LITE) w/Device KIT Use daily as directed to test blood glucose. 1 kit  3  ? carvedilol (COREG) 25 MG tablet Take 1 tablet (25 mg total) by mouth 2 (two) times daily. PATIENT MUST MAKE APPOINTMENT FOR FUTURE REFILLS. THIRD ATTEMPT. 60 tablet 0  ? cetirizine (ZYRTEC) 10 MG tablet Take 10 mg by mouth daily.    ? Cholecalciferol (VITAMIN D) 50 MCG (2000 UT) tablet Take 2,000 Units by mouth daily.    ? dapagliflozin propanediol (FARXIGA) 5 MG TABS tablet Take 1 tablet (5 mg total) by mouth daily before breakfast. 90 tablet 3  ? ezetimibe (ZETIA) 10 MG tablet Take 1 tablet (10 mg total) by mouth daily. 90 tablet 3  ? furosemide (LASIX) 20 MG tablet Take 1 tablet by mouth once daily (Patient taking differently: Take 20 mg by mouth daily.) 90  tablet 3  ? glucose blood (FREESTYLE LITE) test strip Use 1 strip to check blood glucose once daily as directed. 100 each 12  ? glucose blood (FREESTYLE TEST STRIPS) test strip 1 each by Other route daily. And lancets 1/day 100 each 12  ? Lancets (FREESTYLE) lancets Use 1 lancet once daily as directed to test blood glucose. 100 each 12  ? Lancets MISC Use as directed twice per day E11.9 200 each 12  ? meloxicam (MOBIC) 15 MG tablet Take 1 tablet (15 mg total) by mouth daily with meals 30 tablet 0  ? Multiple Vitamins-Minerals (MULTIVITAMIN WITH MINERALS) tablet Take 1 tablet by mouth daily.    ? neomycin-polymyxin b-dexamethasone (MAXITROL) 3.5-10000-0.1 SUSP Instill 1 drop into right eye 4 times daily until next visit Monday; alternating drops with artificial tears 5 mL 0  ? nitroGLYCERIN (NITROSTAT) 0.4 MG SL tablet Place 1 tablet (0.4 mg total) under the tongue every 5 (five) minutes as needed for chest pain. 25 tablet 3  ? PEG-KCl-NaCl-NaSulf-Na Asc-C (PLENVU) 140 g SOLR Take 1 kit by mouth as directed. 3 each 0  ? PRESCRIPTION MEDICATION Inhale into the lungs at bedtime. CPAP    ? sacubitril-valsartan (ENTRESTO) 49-51 MG Take 1 tablet by mouth 2 (two) times daily. Keep appt for January 11, 2022  for additional refills 60 tablet 3  ? Semaglutide (RYBELSUS) 14 MG TABS Take 1 tablet by mouth once daily 90 tablet 3  ? spironolactone (ALDACTONE) 25 MG tablet Take 1 tablet by mouth once daily (Patient taking differently: Take 25 mg by mouth daily.) 90 tablet 3  ? budesonide-formoterol (SYMBICORT) 160-4.5 MCG/ACT inhaler INHALE 2 PUFFS BY MOUTH TWICE A DAY (Patient taking differently: Inhale 2 puffs into the lungs 2 (two) times daily as needed (asthma).) 10.2 g 0  ? ?No current facility-administered medications on file prior to visit.  ? ? ?No Known Allergies ? ?Family History  ?Problem Relation Age of Onset  ? Hypertension Other   ? Heart attack Mother   ? Heart disease Mother   ? Diabetes Mother   ? Heart attack Father    ? Heart disease Father   ? Diabetes Father   ? Asthma Brother   ? Diabetes Maternal Grandfather   ? ? ?BP 112/74 (BP Location: Left Arm, Patient Position: Sitting, Cuff Size: Normal)   Pulse 70   Ht _0  (1.753 m)   Wt (!) 382 lb 6.4 oz (173.5 kg)   SpO2 94%   BMI 56.47 kg/m?  ? ? ?Review of Systems ? ?   ?Objective:  ? Physical Exam ?VITAL SIGNS:  See vs page ?GENERAL: no distress ? ?Lab Results  ?Component Value Date  ? CREATININE 1.08 08/18/2021  ?  BUN 16 08/18/2021  ? NA 139 08/18/2021  ? K 4.0 08/18/2021  ? CL 104 08/18/2021  ? CO2 27 08/18/2021  ? ? ?Lab Results  ?Component Value Date  ? HGBA1C 6.9 (A) 10/18/2021  ? ? ?   ?Assessment & Plan:  ?Type 2 DM ?Diarrhea, due to metformin, not farxiga ? ?Patient Instructions  ?check your blood sugar once a day.  vary the time of day when you check, between before the 3 meals, and at bedtime.  also check if you have symptoms of your blood sugar being too high or too low.  please keep a record of the readings and bring it to your next appointment here (or you can bring the meter itself).  You can write it on any piece of paper.  please call us sooner if your blood sugar goes below 70, or if most of your readings are over 200.  If so, we'll increase your medication.  ?I have sent prescriptions to your pharmacy, to change metformin to extended-release.     ?Please continue the same other medications.   ?You should have an endocrinology follow-up appointment in 4 months.   ? ? ?

## 2021-10-18 NOTE — Patient Instructions (Addendum)
check your blood sugar once a day.  vary the time of day when you check, between before the 3 meals, and at bedtime.  also check if you have symptoms of your blood sugar being too high or too low.  please keep a record of the readings and bring it to your next appointment here (or you can bring the meter itself).  You can write it on any piece of paper.  please call us sooner if your blood sugar goes below 70, or if most of your readings are over 200.  If so, we'll increase your medication.  ?I have sent prescriptions to your pharmacy, to change metformin to extended-release.     ?Please continue the same other medications.   ?You should have an endocrinology follow-up appointment in 4 months.   ?

## 2021-11-05 ENCOUNTER — Other Ambulatory Visit: Payer: Self-pay | Admitting: Cardiology

## 2021-11-05 MED ORDER — CARVEDILOL 25 MG PO TABS: 25.0000 mg | ORAL_TABLET | Freq: Two times a day (BID) | ORAL | 3 refills | Status: DC

## 2021-11-11 ENCOUNTER — Other Ambulatory Visit: Payer: Self-pay | Admitting: Cardiology

## 2021-11-11 DIAGNOSIS — I429 Cardiomyopathy, unspecified: Secondary | ICD-10-CM

## 2021-11-14 DIAGNOSIS — G4733 Obstructive sleep apnea (adult) (pediatric): Secondary | ICD-10-CM | POA: Diagnosis not present

## 2021-11-23 ENCOUNTER — Telehealth: Payer: Self-pay | Admitting: Internal Medicine

## 2021-11-24 NOTE — Telephone Encounter (Signed)
Called Almyra Free from [facility] and she states that she is faxing over another CMN and letter for replacement CPAP for Dr Annamaria Boots that needs to be signed.   Will await for fax for Dr Annamaria Boots

## 2021-11-29 ENCOUNTER — Other Ambulatory Visit (HOSPITAL_COMMUNITY): Payer: Self-pay

## 2021-11-30 NOTE — Telephone Encounter (Signed)
Tried to call Tyler [name] with [employer] but when the call connected, received a message stating that an error occurred and to try the call again. Tried to call back but received the same message. Will try to call back later.

## 2021-11-30 NOTE — Telephone Encounter (Signed)
No I havent recevied anything as of yet

## 2021-11-30 NOTE — Telephone Encounter (Signed)
Looked through Dr. Roxy Cedar mail and did not see anything for this patient.   Tyler [name], have you received any paperwork combined with his CMN?

## 2021-12-03 NOTE — Telephone Encounter (Signed)
Form has been received to be filled out by Dr. Maple Hudson

## 2021-12-03 NOTE — Telephone Encounter (Signed)
Called and spoke with Tyler [name] and asked her if she had everything that she needs or if she is still waiting. She said when she checked this morning they had not received what was needed. I asked her to refax the form that need to be done. Will await fax

## 2021-12-03 NOTE — Telephone Encounter (Signed)
Form has been faxed back to [employer] with confirmation received. Nothing further needed at this time.

## 2021-12-05 ENCOUNTER — Other Ambulatory Visit: Payer: Self-pay | Admitting: Cardiology

## 2021-12-05 DIAGNOSIS — I429 Cardiomyopathy, unspecified: Secondary | ICD-10-CM

## 2021-12-05 DIAGNOSIS — E785 Hyperlipidemia, unspecified: Secondary | ICD-10-CM

## 2021-12-09 NOTE — Progress Notes (Deleted)
HPI: FU hypertension and nonischemic cardiomyopathy. Patient had a previous nonischemic cardiomyopathy which improved on fu echos. However he was seen for recurrent chest pain. Nuclear study June 2016 showed an ejection fraction of 37%. There is an old inferior/inferior lateral infarct with peri-infarct ischemia. Cardiac catheterization July 2016 showed a 90% first posterior lateral branch, 90% distal LAD and 50% proximal RCA. Medical therapy recommended. Most recent echocardiogram November 2020 showed ejection fraction 45 to 50%, moderate left ventricular hypertrophy, grade 1 diastolic dysfunction and trace aortic insufficiency.  Since last seen,   Current Outpatient Medications  Medication Sig Dispense Refill   albuterol (PROVENTIL HFA;VENTOLIN HFA) 108 (90 BASE) MCG/ACT inhaler Inhale 2 puffs into the lungs every 6 (six) hours as needed for wheezing. 1 Inhaler 11   amLODipine (NORVASC) 10 MG tablet Take 1 tablet by mouth once daily (Patient taking differently: Take 10 mg by mouth daily.) 90 tablet 3   aspirin EC 81 MG tablet Take 81 mg by mouth daily.     atorvastatin (LIPITOR) 80 MG tablet Take 1 tablet by mouth once daily 90 tablet 0   Blood Glucose Monitoring Suppl (FREESTYLE LITE) DEVI 1 Device by Does not apply route daily. 100 each 3   Blood Glucose Monitoring Suppl (FREESTYLE LITE) w/Device KIT Use daily as directed to test blood glucose. 1 kit 3   budesonide-formoterol (SYMBICORT) 160-4.5 MCG/ACT inhaler INHALE 2 PUFFS BY MOUTH TWICE A DAY (Patient taking differently: Inhale 2 puffs into the lungs 2 (two) times daily as needed (asthma).) 10.2 g 0   carvedilol (COREG) 25 MG tablet Take 1 tablet (25 mg total) by mouth 2 (two) times daily. 180 tablet 3   cetirizine (ZYRTEC) 10 MG tablet Take 10 mg by mouth daily.     Cholecalciferol (VITAMIN D) 50 MCG (2000 UT) tablet Take 2,000 Units by mouth daily.     dapagliflozin propanediol (FARXIGA) 5 MG TABS tablet Take 1 tablet (5 mg total)  by mouth daily before breakfast. 90 tablet 3   ezetimibe (ZETIA) 10 MG tablet Take 1 tablet (10 mg total) by mouth daily. 90 tablet 3   furosemide (LASIX) 20 MG tablet Take 1 tablet by mouth once daily 90 tablet 0   glucose blood (FREESTYLE LITE) test strip Use 1 strip to check blood glucose once daily as directed. 100 each 12   glucose blood (FREESTYLE TEST STRIPS) test strip 1 each by Other route daily. And lancets 1/day 100 each 12   Lancets (FREESTYLE) lancets Use 1 lancet once daily as directed to test blood glucose. 100 each 12   Lancets MISC Use as directed twice per day E11.9 200 each 12   meloxicam (MOBIC) 15 MG tablet Take 1 tablet (15 mg total) by mouth daily with meals 30 tablet 0   metFORMIN (GLUCOPHAGE-XR) 500 MG 24 hr tablet Take 4 tablets (2,000 mg total) by mouth daily. 360 tablet 1   Multiple Vitamins-Minerals (MULTIVITAMIN WITH MINERALS) tablet Take 1 tablet by mouth daily.     neomycin-polymyxin b-dexamethasone (MAXITROL) 3.5-10000-0.1 SUSP Instill 1 drop into right eye 4 times daily until next visit Monday; alternating drops with artificial tears 5 mL 0   nitroGLYCERIN (NITROSTAT) 0.4 MG SL tablet Place 1 tablet (0.4 mg total) under the tongue every 5 (five) minutes as needed for chest pain. 25 tablet 3   PEG-KCl-NaCl-NaSulf-Na Asc-C (PLENVU) 140 g SOLR Take 1 kit by mouth as directed. 3 each 0   PRESCRIPTION MEDICATION Inhale into the lungs  at bedtime. CPAP     sacubitril-valsartan (ENTRESTO) 49-51 MG Take 1 tablet by mouth 2 (two) times daily. Keep appt for January 11, 2022  for additional refills 60 tablet 3   Semaglutide (RYBELSUS) 14 MG TABS Take 1 tablet by mouth once daily 90 tablet 3   spironolactone (ALDACTONE) 25 MG tablet Take 1 tablet by mouth once daily 90 tablet 0   No current facility-administered medications for this visit.     Past Medical History:  Diagnosis Date   ACHILLES TENDINITIS, BILATERAL 10/16/2008   ALLERGIC RHINITIS 08/13/2009   CARDIOMYOPATHY  04/21/2009   CHF (congestive heart failure) ([place])    CONGESTIVE HEART FAILURE 12/04/2007   CORONARY ARTERY DISEASE 12/04/2007   GERD (gastroesophageal reflux disease) 11/01/2011   HYPERLIPIDEMIA 03/12/2007   HYPERTENSION 03/12/2007   OBESITY 04/21/2009   Plantar fasciitis, bilateral 11/01/2011   SLEEP APNEA 03/12/2007    Past Surgical History:  Procedure Laterality Date   CARDIAC CATHETERIZATION N/A 12/19/2014   Procedure: Left Heart Cath and Coronary Angiography;  Surgeon: Belva Crome, MD;  Location: [place];  Service: Cardiovascular;  Laterality: N/A;   COLONOSCOPY WITH PROPOFOL N/A 11/19/2020   Procedure: COLONOSCOPY WITH PROPOFOL;  Surgeon: Milus Banister, MD;  Location: [facility];  Service: Endoscopy;  Laterality: N/A;   NO PAST SURGERIES      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[employer] [place]    Employer: [employer]  Tobacco Use   Smoking status: Never   Smokeless tobacco: Never   Tobacco comments:    never  Scientific laboratory technician Use: Never used  Substance and Sexual Activity   Alcohol use: No   Drug use: No   Sexual activity: Not on file  Other Topics Concern   Not on file  Social History Narrative   Not on file   Social Determinants of Health   Financial Resource Strain: Not on file  Food Insecurity: Not on file  Transportation Needs: Not on file  Physical Activity: Not on file  Stress: Not on file  Social Connections: Not on file  Intimate Partner Violence: Not on file    Family History  Problem Relation Age of Onset   Hypertension Other    Heart attack Mother    Heart disease Mother    Diabetes Mother    Heart attack Father    Heart disease Father    Diabetes Father    Asthma Brother    Diabetes Maternal Grandfather      ROS: no fevers or chills, productive cough, hemoptysis, dysphasia, odynophagia, melena, hematochezia, dysuria, hematuria, rash, seizure activity, orthopnea, PND, pedal edema, claudication. Remaining systems are negative.  Physical Exam: Well-developed well-nourished in no acute distress.  Skin is warm and dry.  HEENT is normal.  Neck is supple.  Chest is clear to auscultation with normal expansion.  Cardiovascular exam is regular rate and rhythm.  Abdominal exam nontender or distended. No masses palpated. Extremities show no edema. neuro grossly intact  ECG- personally reviewed  A/P  1 nonischemic cardiomyopathy-continue coreg and entresto; repeat echo.   2 hypertension-BP controlled.  Continue present medications and follow.   3 coronary artery disease-continue aspirin and statin. Pt denies CP.  4 hyperlipidemia-continue statin.   5 chronic combined systolic/diastolic congestive heart failure-patient appears to be euvolemic. Continue lasix, spironolactone and farxiga.   6 morbid obesity-we again discussed the importance of weight loss.  Kirk Ruths, MD

## 2021-12-15 DIAGNOSIS — G4733 Obstructive sleep apnea (adult) (pediatric): Secondary | ICD-10-CM | POA: Diagnosis not present

## 2021-12-17 ENCOUNTER — Other Ambulatory Visit: Payer: Self-pay | Admitting: Cardiology

## 2021-12-20 ENCOUNTER — Ambulatory Visit: Payer: Federal, State, Local not specified - PPO | Admitting: Cardiology

## 2021-12-23 DIAGNOSIS — R051 Acute cough: Secondary | ICD-10-CM | POA: Diagnosis not present

## 2021-12-23 DIAGNOSIS — R5381 Other malaise: Secondary | ICD-10-CM | POA: Diagnosis not present

## 2021-12-23 DIAGNOSIS — J189 Pneumonia, unspecified organism: Secondary | ICD-10-CM | POA: Diagnosis not present

## 2021-12-23 DIAGNOSIS — R5383 Other fatigue: Secondary | ICD-10-CM | POA: Diagnosis not present

## 2021-12-29 ENCOUNTER — Ambulatory Visit: Payer: Federal, State, Local not specified - PPO | Admitting: Physician Assistant

## 2021-12-29 ENCOUNTER — Encounter: Payer: Self-pay | Admitting: Physician Assistant

## 2021-12-29 VITALS — BP 122/70 | HR 79 | Ht 69.0 in | Wt 386.0 lb

## 2021-12-29 DIAGNOSIS — I428 Other cardiomyopathies: Secondary | ICD-10-CM

## 2021-12-29 DIAGNOSIS — I10 Essential (primary) hypertension: Secondary | ICD-10-CM | POA: Diagnosis not present

## 2021-12-29 NOTE — Patient Instructions (Signed)
Medication Instructions:  Your physician recommends that you continue on your current medications as directed. Please refer to the Current Medication list given to you today.  Pleas be sure to take your COREG 25mg  and ENTRESTO 49-51 TWICE DAILY. *If you need a refill on your cardiac medications before your next appointment, please call your pharmacy*   Lab Work: NONE If you have labs (blood work) drawn today and your tests are completely normal, you will receive your results only by: MyChart Message (if you have MyChart) OR A paper copy in the mail If you have any lab test that is abnormal or we need to change your treatment, we will call you to review the results.   Testing/Procedures: Your physician has requested that you have an echocardiogram. Echocardiography is a painless test that uses sound waves to create images of your heart. It provides your doctor with information about the size and shape of your heart and how well your heart's chambers and valves are working. This procedure takes approximately one hour. There are no restrictions for this procedure.   Please schedule for 3-4 months    Follow-Up: At [facility], you and your health needs are our priority.  As part of our continuing mission to provide you with exceptional heart care, we have created designated Provider Care Teams.  These Care Teams include your primary Cardiologist (physician) and Advanced Practice Providers (APPs -  Physician Assistants and Nurse Practitioners) who all work together to provide you with the care you need, when you need it.  Your next appointment:   1 year(s)  The format for your next appointment:   In Person  Provider:   [facility], MD

## 2021-12-29 NOTE — Progress Notes (Signed)
Cardiology Office Note:    Date:  01/01/2022   ID:  Tyler [name], DOB 1975-10-25, MRN [id number]  PCP:  Biagio Borg, MD   [facility] Cardiologist:  Kirk Ruths, MD     Referring MD: Biagio Borg, MD   Chief Complaint  Patient presents with   Follow-up    Seen for Dr. Stanford Breed    History of Present Illness:    Tyler [name] is a 46 y.o. male with a hx of hypertension, nonischemic cardiomyopathy, hyperlipidemia, obesity and obstructive sleep apnea.  Patient has a history of nonischemic cardiomyopathy with improved EF.  Due to recurrent chest discomfort, Myoview was obtained in June 2016 that showed EF 37%, akinesis of the basal and mid inferior and inferolateral wall with peri-infarct ischemia.  Subsequent cardiac catheterization by Dr. Tamala Julian performed on 12/19/2014 showed 90% first RPLB lesion, 90% distal LAD lesion, 50% proximal to distal RCA lesion, EF 25 to 35%.  Medical therapy was recommended.  Repeat echocardiogram in October 2016 shows EF improved to 40 to 45%.  Most recent echocardiogram obtained on 04/30/2019 showed EF 45 to 50%, moderate LVH, grade 1 DD, trace MR, trivial AI.  Patient was last seen by Dr. Stanford Breed in March 2022, losartan was discontinued and he was placed on Entresto 49-51 mg twice a day.  Patient presents today for follow-up.  Unfortunately he is only taking carvedilol and Entresto once a day instead of twice a day as previously prescribed.  I recommend start taking both medication as prescribed.  He does not have significant lower extremity edema, orthopnea or PND.  I recommended repeat echocardiogram in October.   Past Medical History:  Diagnosis Date   ACHILLES TENDINITIS, BILATERAL 10/16/2008   ALLERGIC RHINITIS 08/13/2009   CARDIOMYOPATHY 04/21/2009   CHF (congestive heart failure) ([place])    CONGESTIVE HEART FAILURE 12/04/2007   CORONARY ARTERY DISEASE 12/04/2007   GERD (gastroesophageal reflux disease) 11/01/2011    HYPERLIPIDEMIA 03/12/2007   HYPERTENSION 03/12/2007   OBESITY 04/21/2009   Plantar fasciitis, bilateral 11/01/2011   SLEEP APNEA 03/12/2007    Past Surgical History:  Procedure Laterality Date   CARDIAC CATHETERIZATION N/A 12/19/2014   Procedure: Left Heart Cath and Coronary Angiography;  Surgeon: Belva Crome, MD;  Location: [place] [facility];  Service: Cardiovascular;  Laterality: N/A;   COLONOSCOPY WITH PROPOFOL N/A 11/19/2020   Procedure: COLONOSCOPY WITH PROPOFOL;  Surgeon: Milus Banister, MD;  Location: [facility];  Service: Endoscopy;  Laterality: N/A;   NO PAST SURGERIES      Current Medications: Current Meds  Medication Sig   albuterol (PROVENTIL HFA;VENTOLIN HFA) 108 (90 BASE) MCG/ACT inhaler Inhale 2 puffs into the lungs every 6 (six) hours as needed for wheezing.   amLODipine (NORVASC) 10 MG tablet Take 1 tablet (10 mg total) by mouth daily. SCHEDULE OFFICE VISIT FOR FUTURE REFILLS.   aspirin EC 81 MG tablet Take 81 mg by mouth daily.   atorvastatin (LIPITOR) 80 MG tablet Take 1 tablet by mouth once daily   Blood Glucose Monitoring Suppl (FREESTYLE LITE) DEVI 1 Device by Does not apply route daily.   Blood Glucose Monitoring Suppl (FREESTYLE LITE) w/Device KIT Use daily as directed to test blood glucose.   budesonide-formoterol (SYMBICORT) 160-4.5 MCG/ACT inhaler INHALE 2 PUFFS BY MOUTH TWICE A DAY (Patient taking differently: Inhale 2 puffs into the lungs 2 (two) times daily as needed (asthma).)   carvedilol (COREG) 25 MG tablet Take 1 tablet (25  mg total) by mouth 2 (two) times daily.   cetirizine (ZYRTEC) 10 MG tablet Take 10 mg by mouth daily.   Cholecalciferol (VITAMIN D) 50 MCG (2000 UT) tablet Take 2,000 Units by mouth daily.   dapagliflozin propanediol (FARXIGA) 5 MG TABS tablet Take 1 tablet (5 mg total) by mouth daily before breakfast.   ezetimibe (ZETIA) 10 MG tablet Take 1 tablet (10 mg total) by mouth daily.   furosemide (LASIX) 20 MG tablet Take 1 tablet by mouth  once daily   glucose blood (FREESTYLE LITE) test strip Use 1 strip to check blood glucose once daily as directed.   glucose blood (FREESTYLE TEST STRIPS) test strip 1 each by Other route daily. And lancets 1/day   Lancets (FREESTYLE) lancets Use 1 lancet once daily as directed to test blood glucose.   Lancets MISC Use as directed twice per day E11.9   Multiple Vitamins-Minerals (MULTIVITAMIN WITH MINERALS) tablet Take 1 tablet by mouth daily.   nitroGLYCERIN (NITROSTAT) 0.4 MG SL tablet Place 1 tablet (0.4 mg total) under the tongue every 5 (five) minutes as needed for chest pain.   PEG-KCl-NaCl-NaSulf-Na Asc-C (PLENVU) 140 g SOLR Take 1 kit by mouth as directed.   PRESCRIPTION MEDICATION Inhale into the lungs at bedtime. CPAP   sacubitril-valsartan (ENTRESTO) 49-51 MG Take 1 tablet by mouth 2 (two) times daily. Keep appt for January 11, 2022  for additional refills   Semaglutide (RYBELSUS) 14 MG TABS Take 1 tablet by mouth once daily   spironolactone (ALDACTONE) 25 MG tablet Take 1 tablet by mouth once daily     Allergies:   Patient has no known allergies.   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[employer] [place]    Employer: [employer]  Tobacco Use   Smoking status: Never   Smokeless tobacco: Never   Tobacco comments:    never  Scientific laboratory technician Use: Never used  Substance and Sexual Activity   Alcohol use: No   Drug use: No   Sexual activity: Not on file  Other Topics Concern   Not on file  Social History Narrative   Not on file   Social Determinants of Health   Financial Resource Strain: Not on file  Food Insecurity: Not on file  Transportation Needs: Not on file  Physical Activity: Not on file  Stress: Not on file  Social Connections: Not on file      Family History: The patient's family history includes Asthma in his brother; Diabetes in his father, maternal grandfather, and mother; Heart attack in his father and mother; Heart disease in his father and mother; Hypertension in an other family member.  ROS:   Please see the history of present illness.     All other systems reviewed and are negative.  EKGs/Labs/Other Studies Reviewed:    The following studies were reviewed today:  Echo 04/30/2019 1. Left ventricular ejection fraction, by visual estimation, is 45 to  50%. The left ventricle has mildly decreased function. There is moderately  increased left ventricular hypertrophy.   2. Left ventricular diastolic parameters are consistent with Grade I  diastolic dysfunction (impaired relaxation).   3. Global right ventricle has normal systolic function.The right  ventricular size is normal.  No increase in right ventricular wall  thickness.   4. Left atrial size was normal.   5. Right atrial size was normal.   6. The mitral valve is grossly normal. Trace mitral valve regurgitation.   7. The tricuspid valve is grossly normal. Tricuspid valve regurgitation  is trivial.   8. The aortic valve is tricuspid. Aortic valve regurgitation is trivial.   9. The pulmonic valve was grossly normal. Pulmonic valve regurgitation is  trivial.  10. Normal pulmonary artery systolic pressure.  11. The inferior vena cava is normal in size with greater than 50%  respiratory variability, suggesting right atrial pressure of 3 mmHg.   EKG:  EKG is ordered today.  The ekg ordered today demonstrates normal sinus rhythm, no significant ST-T wave changes  Recent Labs: 08/18/2021: ALT 23; BUN 16; Creatinine, Ser 1.08; Hemoglobin 13.6; Platelets 225.0; Potassium 4.0; Sodium 139; TSH 2.01  Recent Lipid Panel    Component Value Date/Time   CHOL 96 08/18/2021 1559   CHOL 142 06/12/2019 1152   TRIG 96.0 08/18/2021 1559   HDL 30.80 (L) 08/18/2021 1559   HDL 39  (L) 06/12/2019 1152   CHOLHDL 3 08/18/2021 1559   VLDL 19.2 08/18/2021 1559   LDLCALC 46 08/18/2021 1559   LDLCALC 87 06/12/2019 1152   LDLDIRECT 183.6 08/02/2011 1642     Risk Assessment/Calculations:           Physical Exam:    VS:  BP 122/70   Pulse 79   Ht _0  (1.753 m)   Wt (!) 386 lb (175.1 kg)   SpO2 98%   BMI 57.00 kg/m     Wt Readings from Last 3 Encounters:  12/29/21 (!) 386 lb (175.1 kg)  10/18/21 (!) 382 lb 6.4 oz (173.5 kg)  08/18/21 (!) 381 lb 3.2 oz (172.9 kg)     GEN:  Well nourished, well developed in no acute distress HEENT: Normal NECK: No JVD; No carotid bruits LYMPHATICS: No lymphadenopathy CARDIAC: RRR, no murmurs, rubs, gallops RESPIRATORY:  Clear to auscultation without rales, wheezing or rhonchi  ABDOMEN: Soft, non-tender, non-distended MUSCULOSKELETAL:  No edema; No deformity  SKIN: Warm and dry NEUROLOGIC:  Alert and oriented x 3 PSYCHIATRIC:  Normal affect   ASSESSMENT:    1. NICM (nonischemic cardiomyopathy) ([place])   2. Essential hypertension    PLAN:    In order of problems listed above:  Nonischemic cardiomyopathy: He is only taking carvedilol and Entresto once a day instead of twice a day.  I recommend to start taking both medication twice a day as instructed.  He will need a repeat echocardiogram in October  Hypertension: Blood pressure stable on current therapy, however he has not been taking the carvedilol and Entresto as instructed.  He will need to be more compliant with his medication.           Medication Adjustments/Labs and Tests Ordered: Current medicines are reviewed at length with the patient today.  Concerns regarding medicines are outlined above.  Orders Placed This Encounter  Procedures   EKG 12-Lead   ECHOCARDIOGRAM COMPLETE   No orders of the defined types were placed in this encounter.   Patient Instructions  Medication Instructions:  Your physician recommends that you continue on your current  medications as directed. Please refer to the Current Medication list given to you today.  Pleas be sure to take your COREG 30m and ENTRESTO 49-51 TWICE DAILY. *If you need a refill on your cardiac medications before your  next appointment, please call your pharmacy*   Lab Work: NONE If you have labs (blood work) drawn today and your tests are completely normal, you will receive your results only by: [place] (if you have MyChart) OR A paper copy in the mail If you have any lab test that is abnormal or we need to change your treatment, we will call you to review the results.   Testing/Procedures: Your physician has requested that you have an echocardiogram. Echocardiography is a painless test that uses sound waves to create images of your heart. It provides your doctor with information about the size and shape of your heart and how well your heart's chambers and valves are working. This procedure takes approximately one hour. There are no restrictions for this procedure.   Please schedule for 3-4 months    Follow-Up: At [facility], you and your health needs are our priority.  As part of our continuing mission to provide you with exceptional heart care, we have created designated Provider Care Teams.  These Care Teams include your primary Cardiologist (physician) and Advanced Practice Providers (APPs -  Physician Assistants and Nurse Practitioners) who all work together to provide you with the care you need, when you need it.  Your next appointment:   1 year(s)  The format for your next appointment:   In Person  Provider:   Kirk Ruths, MD     Signed, Almyra Deforest, [place]  01/01/2022 12:32 AM    [place]

## 2022-01-01 ENCOUNTER — Encounter: Payer: Self-pay | Admitting: Physician Assistant

## 2022-01-03 ENCOUNTER — Ambulatory Visit: Payer: Federal, State, Local not specified - PPO | Admitting: Nurse Practitioner

## 2022-01-04 ENCOUNTER — Other Ambulatory Visit (HOSPITAL_COMMUNITY): Payer: Self-pay

## 2022-01-10 ENCOUNTER — Other Ambulatory Visit: Payer: Self-pay | Admitting: Cardiology

## 2022-01-11 ENCOUNTER — Ambulatory Visit: Payer: Federal, State, Local not specified - PPO | Admitting: Cardiology

## 2022-01-14 ENCOUNTER — Ambulatory Visit: Payer: Federal, State, Local not specified - PPO | Admitting: Adult Health

## 2022-01-14 DIAGNOSIS — G4733 Obstructive sleep apnea (adult) (pediatric): Secondary | ICD-10-CM | POA: Diagnosis not present

## 2022-01-23 NOTE — Progress Notes (Signed)
HPI M never smoker followed for bronchitis, OSA,, allergic rhinitis complicated by CHF, GERD, morbid obesity Unattended home sleep study 08/26/13- AHI 17/ hr, weight 393 lbs ---------------------------------------------------------------------------   01/18/21- 45 yoM never smoker followed for bronchitis, OSA,, allergic rhinitis complicated by CHF/CM, GERD, morbid obesity CPAP auto 8-20/ [employer] [facility]- compliance 83%, AHI 0.5/ hr from April Body weight today-373 lbs Covid vax-3 Ph[employer] -----OSA- everything is working great.  He denies problems with CPAP, sleeping ok. Discussed replacing old machine Denies new cardiac concerns. Feels medically stable.  01/24/22-  46 yoM never smoker followed for bronchitis, OSA,, allergic Rhinitis, Asthmatic Bronchitis, complicated by CHF/CM, GERD, morbid Obesity, DM2, Cardiomyopathy, CHF, CAD, HTN,  -Albuterol hfa, Symbicort 160,  CPAP auto 8-20/ [employer] Download- compliance- 53%, AHI 0.9/ hr Body weight today-382 lbs Covid vax-3 Ph[employer] Reports pneumonia on trip to [place] last month. Treated at [facility]- reports CXR showed R pneumonia. Got injection, Zpak and ?prednisone. Feels resolved and back to using CPAP. He asks about pneumonia vaccine- had pneumovax in 2016. Heart stable.  ROS-see HPI   + = positive Constitutional:    weight loss, night sweats, fevers, chills,+fatigue, lassitude. HEENT:    headaches, difficulty swallowing, tooth/dental problems, sore throat,       sneezing, itching, ear ache, nasal congestion, post nasal drip, snoring CV:    chest pain, orthopnea, PND, swelling in lower extremities, anasarca,                                                      dizziness, palpitations Resp:   +shortness of breath with exertion or at rest.                productive cough,   non-productive cough, coughing up of blood.              change in color of mucus.  wheezing.   Skin:    rash or lesions. GI:  No-   heartburn, indigestion, abdominal pain, nausea,  vomiting,  GU:  MS:   joint pain, stiffness,  Neuro-     nothing unusual Psych:  change in mood or affect.  depression or anxiety.   memory loss.  OBJ- Physical Exam General- Alert, Oriented, Affect-appropriate, Distress- none acute, + morbid obesity Skin- rash-none, lesions- none, excoriation- none Lymphadenopathy- none Head- atraumatic            Eyes- Gross vision intact, PERRLA, conjunctivae and secretions clear            Ears- Hearing, canals-normal            Nose- Clear, no-Septal dev, mucus, polyps, erosion, perforation             Throat- Mallampati IV , mucosa clear , drainage- none, tonsils- atrophic Neck- flexible , trachea midline, no stridor , thyroid nl, carotid no bruit Chest - symmetrical excursion , unlabored           Heart/CV- RRR , no murmur , no gallop  , no rub, nl s1 s2                           - JVD- none , edema- none, stasis changes- none, varices- none           Lung- +few mild rhonchi R base, wheeze- none,  cough- none , dullness-none, rub- none           Chest wall-  Abd-  Br/ Gen/ Rectal- Not done, not indicated Extrem- cyanosis- none, clubbing, none, atrophy- none, strength- nl Neuro- grossly intact to observation    .

## 2022-01-24 ENCOUNTER — Other Ambulatory Visit (HOSPITAL_COMMUNITY): Payer: Self-pay

## 2022-01-24 ENCOUNTER — Encounter: Payer: Self-pay | Admitting: Internal Medicine

## 2022-01-24 ENCOUNTER — Ambulatory Visit: Payer: Federal, State, Local not specified - PPO | Admitting: Internal Medicine

## 2022-01-24 VITALS — BP 110/70 | HR 58 | Ht 69.0 in | Wt 382.0 lb

## 2022-01-24 DIAGNOSIS — G4733 Obstructive sleep apnea (adult) (pediatric): Secondary | ICD-10-CM | POA: Diagnosis not present

## 2022-01-24 DIAGNOSIS — J189 Pneumonia, unspecified organism: Secondary | ICD-10-CM | POA: Insufficient documentation

## 2022-01-24 DIAGNOSIS — J4521 Mild intermittent asthma with (acute) exacerbation: Secondary | ICD-10-CM

## 2022-01-24 MED ORDER — BUDESONIDE-FORMOTEROL FUMARATE 160-4.5 MCG/ACT IN AERO
2.0000 | INHALATION_SPRAY | Freq: Two times a day (BID) | RESPIRATORY_TRACT | 12 refills | Status: DC
Start: 2022-01-24 — End: 2023-09-18
  Filled 2022-01-24 – 2022-02-10 (×2): qty 10.2, 30d supply, fill #0

## 2022-01-24 MED ORDER — ALBUTEROL SULFATE HFA 108 (90 BASE) MCG/ACT IN AERS
2.0000 | INHALATION_SPRAY | Freq: Four times a day (QID) | RESPIRATORY_TRACT | 11 refills | Status: DC | PRN
  Filled 2022-01-24 – 2022-02-10 (×2): qty 6.7, 25d supply, fill #0

## 2022-01-24 NOTE — Assessment & Plan Note (Signed)
Prevnar 20 with appropriate discussion

## 2022-01-24 NOTE — Assessment & Plan Note (Signed)
Benefits from CPAP. Back on schedule after pneumonia. Plan- continue auto 8-20

## 2022-01-24 NOTE — Assessment & Plan Note (Signed)
Continuing to encourage weight loss. May benefit from external support program.

## 2022-01-24 NOTE — Patient Instructions (Addendum)
We can continue CPAP auto 8-20  Order- Pneumonia vaccine Prevnar 20  Inhalers refilled at [facility]

## 2022-02-01 ENCOUNTER — Other Ambulatory Visit (HOSPITAL_COMMUNITY): Payer: Self-pay

## 2022-02-05 ENCOUNTER — Other Ambulatory Visit: Payer: Self-pay | Admitting: Cardiology

## 2022-02-05 ENCOUNTER — Other Ambulatory Visit (HOSPITAL_COMMUNITY): Payer: Self-pay

## 2022-02-07 ENCOUNTER — Other Ambulatory Visit: Payer: Self-pay

## 2022-02-07 DIAGNOSIS — E1165 Type 2 diabetes mellitus with hyperglycemia: Secondary | ICD-10-CM

## 2022-02-07 MED ORDER — RYBELSUS 14 MG PO TABS: 14.0000 mg | ORAL_TABLET | Freq: Every day | ORAL | 3 refills | Status: DC

## 2022-02-10 ENCOUNTER — Other Ambulatory Visit (HOSPITAL_COMMUNITY): Payer: Self-pay

## 2022-02-14 ENCOUNTER — Encounter: Payer: Self-pay | Admitting: Internal Medicine

## 2022-02-14 ENCOUNTER — Ambulatory Visit: Payer: Federal, State, Local not specified - PPO | Admitting: Internal Medicine

## 2022-02-14 VITALS — BP 130/82 | HR 71 | Ht 69.0 in | Wt 379.6 lb

## 2022-02-14 DIAGNOSIS — E1159 Type 2 diabetes mellitus with other circulatory complications: Secondary | ICD-10-CM | POA: Diagnosis not present

## 2022-02-14 DIAGNOSIS — E785 Hyperlipidemia, unspecified: Secondary | ICD-10-CM | POA: Diagnosis not present

## 2022-02-14 DIAGNOSIS — G4733 Obstructive sleep apnea (adult) (pediatric): Secondary | ICD-10-CM | POA: Diagnosis not present

## 2022-02-14 DIAGNOSIS — E1165 Type 2 diabetes mellitus with hyperglycemia: Secondary | ICD-10-CM

## 2022-02-14 LAB — POCT GLYCOSYLATED HEMOGLOBIN (HGB A1C): Hemoglobin A1C: 7.4 % — AB (ref 4.0–5.6)

## 2022-02-14 NOTE — Patient Instructions (Addendum)
Stop regular sodas.  Please continue: - Metformin ER 2000 mg in am - Farxiga 5 mg before breakfast - Rybelsus 14 mg before breakfast  Please check sugars 1x a day, rotating check times.  PATIENT INSTRUCTIONS FOR TYPE 2 DIABETES:  **Please join MyChart!** - see attached instructions about how to join if you have not done so already.  DIET AND EXERCISE Diet and exercise is an important part of diabetic treatment.  We recommended aerobic exercise in the form of brisk walking (working between 40-60% of maximal aerobic capacity, similar to brisk walking) for 150 minutes per week (such as 30 minutes five days per week) along with 3 times per week performing 'resistance' training (using various gauge rubber tubes with handles) 5-10 exercises involving the major muscle groups (upper body, lower body and core) performing 10-15 repetitions (or near fatigue) each exercise. Start at half the above goal but build slowly to reach the above goals. If limited by weight, joint pain, or disability, we recommend daily walking in a swimming pool with water up to waist to reduce pressure from joints while allow for adequate exercise.    BLOOD GLUCOSES Monitoring your blood glucoses is important for continued management of your diabetes. Please check your blood glucoses 2-4 times a day: fasting, before meals and at bedtime (you can rotate these measurements - e.g. one day check before the 3 meals, the next day check before 2 of the meals and before bedtime, etc.).   HYPOGLYCEMIA (low blood sugar) Hypoglycemia is usually a reaction to not eating, exercising, or taking too much insulin/ other diabetes drugs.  Symptoms include tremors, sweating, hunger, confusion, headache, etc. Treat IMMEDIATELY with 15 grams of Carbs: 4 glucose tablets  cup regular juice/soda 2 tablespoons raisins 4 teaspoons sugar 1 tablespoon honey Recheck blood glucose in 15 mins and repeat above if still symptomatic/blood glucose  <100.  RECOMMENDATIONS TO REDUCE YOUR RISK OF DIABETIC COMPLICATIONS: * Take your prescribed MEDICATION(S) * Follow a DIABETIC diet: Complex carbs, fiber rich foods, (monounsaturated and polyunsaturated) fats * AVOID saturated/trans fats, high fat foods, >2,300 mg salt per day. * EXERCISE at least 5 times a week for 30 minutes or preferably daily.  * DO NOT SMOKE OR DRINK more than 1 drink a day. * Check your FEET every day. Do not wear tightfitting shoes. Contact us if you develop an ulcer * See your EYE doctor once a year or more if needed * Get a FLU shot once a year * Get a PNEUMONIA vaccine once before and once after age 34 years  GOALS:  * Your Hemoglobin A1c of <7%  * fasting sugars need to be <130 * after meals sugars need to be <180 (2h after you start eating) * Your Systolic BP should be 140 or lower  * Your Diastolic BP should be 80 or lower  * Your HDL (Good Cholesterol) should be 40 or higher  * Your LDL (Bad Cholesterol) should be 100 or lower. * Your Triglycerides should be 150 or lower  * Your Urine microalbumin (kidney function) should be <30 * Your Body Mass Index should be 25 or lower    Please consider the following ways to cut down carbs and fat and increase fiber and micronutrients in your diet: - substitute whole grain for white bread or pasta - substitute brown rice for white rice - substitute 90-calorie flat bread pieces for slices of bread when possible - substitute sweet potatoes or yams for white potatoes - substitute humus  for margarine - substitute tofu for cheese when possible - substitute almond or rice milk for regular milk (would not drink soy milk daily due to concern for soy estrogen influence on breast cancer risk) - substitute dark chocolate for other sweets when possible - substitute water - can add lemon or orange slices for taste - for diet sodas (artificial sweeteners will trick your body that you can eat sweets without getting calories and  will lead you to overeating and weight gain in the long run) - do not skip breakfast or other meals (this will slow down the metabolism and will result in more weight gain over time)  - can try smoothies made from fruit and almond/rice milk in am instead of regular breakfast - can also try old-fashioned (not instant) oatmeal made with almond/rice milk in am - order the dressing on the side when eating salad at a restaurant (pour less than half of the dressing on the salad) - eat as little meat as possible - can try juicing, but should not forget that juicing will get rid of the fiber, so would alternate with eating raw veg./fruits or drinking smoothies - use as little oil as possible, even when using olive oil - can dress a salad with a mix of balsamic vinegar and lemon juice, for e.g. - use agave nectar, stevia sugar, or regular sugar rather than artificial sweateners - steam or broil/roast veggies  - snack on veggies/fruit/nuts (unsalted, preferably) when possible, rather than processed foods - reduce or eliminate aspartame in diet (it is in diet sodas, chewing gum, etc) Read the labels!  Try to read Dr. Katherina Right book: "Program for Reversing Diabetes" for other ideas for healthy eating.

## 2022-02-14 NOTE — Progress Notes (Signed)
Patient ID: Tyler [name], male   DOB: 1975-12-26, 46 y.o.   MRN: [id number]  HPI: Tyler [name] is a 46 y.o.-year-old male, returning for follow-up for DM2, dx in 2013, non-insulin-dependent, uncontrolled, with complications (CAD, CHF, mild CKD). Pt. previously saw Dr. Loanne Drilling, last visit almost 4 months ago.  Reviewed HbA1c: Lab Results  Component Value Date   HGBA1C 6.9 (A) 10/18/2021   HGBA1C 7.2 (A) 07/19/2021   HGBA1C 6.2 (A) 04/12/2021   HGBA1C 6.1 (A) 01/11/2021   HGBA1C 6.6 (H) 01/05/2021   HGBA1C 7.1 (A) 11/06/2020   HGBA1C 10.1 (A) 09/25/2020   HGBA1C 15.0 (H) 08/05/2020   HGBA1C 6.8 (A) 12/26/2018   HGBA1C 7.6 (H) 06/22/2018   Pt is on a regimen of: - Metformin ER 2000 in am - Farxiga 5 mg before breakfast - Rybelsus 14 mg before breakfast He had diarrhea with regular metformin.  Pt checks his sugars 0-1x a day and they are: - am: 107-130 - 2h after b'fast: n/c - before lunch: n/c - 2h after lunch: n/c - before dinner: n/c - 2h after dinner: n/c - bedtime: n/c - nighttime: n/c Lowest sugar was 90. Highest sugar was 130.  Glucometer: Freestyle Lite  He drinks 1 Pepsi a day.  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  He tried to drink only water but this caused muscle cramps.  - + Mild CKD, last BUN/creatinine:  Lab Results  Component Value Date   BUN 16 08/18/2021   BUN 20 01/05/2021   CREATININE 1.08 08/18/2021   CREATININE 1.16 01/05/2021  He is on Entresto.  -+ HL; last set of lipids: Lab Results  Component Value Date   CHOL 96 08/18/2021   HDL 30.80 (L) 08/18/2021   LDLCALC 46 08/18/2021   LDLDIRECT 183.6 08/02/2011   TRIG 96.0 08/18/2021   CHOLHDL 3 08/18/2021  On Lipitor 80 mg daily and Zetia 10 mg daily.  - last eye exam was in 04/2021. No DR.   - no numbness and tingling in his feet.  Last foot exam 04/12/2021.  He also has a history of HTN, GERD, sleep apnea, asthmatic bronchitis.  He has a Sport and exercise psychologist.  ROS: + see HPI No increased urination, blurry vision, nausea, chest pain.  Past Medical History:  Diagnosis Date   ACHILLES TENDINITIS, BILATERAL 10/16/2008   ALLERGIC RHINITIS 08/13/2009   CARDIOMYOPATHY 04/21/2009   CHF (congestive heart failure) ([place])    CONGESTIVE HEART FAILURE 12/04/2007   CORONARY ARTERY DISEASE 12/04/2007   GERD (gastroesophageal reflux disease) 11/01/2011   HYPERLIPIDEMIA 03/12/2007   HYPERTENSION 03/12/2007   OBESITY 04/21/2009   Plantar fasciitis, bilateral 11/01/2011   SLEEP APNEA 03/12/2007   Past Surgical History:  Procedure Laterality Date   CARDIAC CATHETERIZATION N/A 12/19/2014   Procedure: Left Heart Cath and Coronary Angiography;  Surgeon: Belva Crome, MD;  Location: [facility];  Service: Cardiovascular;  Laterality: N/A;   COLONOSCOPY WITH PROPOFOL N/A 11/19/2020   Procedure: COLONOSCOPY WITH PROPOFOL;  Surgeon: Milus Banister, MD;  Location: [facility];  Service: Endoscopy;  Laterality: N/A;   NO PAST SURGERIES     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[employer]  Employer: Development worker, community  Tobacco Use   Smoking status: Never   Smokeless tobacco: Never   Tobacco comments:    never  Vaping Use   Vaping Use: Never used  Substance and Sexual Activity   Alcohol use: No   Drug use: No   Sexual activity: Not on file  Other Topics Concern   Not on file  Social History Narrative   Not on file   Social Determinants of Health   Financial Resource Strain: Not on file  Food Insecurity: Not on file  Transportation Needs: Not on file  Physical Activity: Not on file  Stress: Not on file  Social Connections: Not on file  Intimate Partner Violence: Not on file   Current Outpatient Medications on  File Prior to Visit  Medication Sig Dispense Refill   albuterol (VENTOLIN HFA) 108 (90 Base) MCG/ACT inhaler Inhale 2 puffs into the lungs every 6 (six) hours as needed for wheezing. 6.7 g 11   amLODipine (NORVASC) 10 MG tablet TAKE 1 TABLET BY MOUTH ONCE DAILY. SCHEDULE OFFICE VISIT FOR FUTURE REFILLS 30 tablet 0   aspirin EC 81 MG tablet Take 81 mg by mouth daily.     atorvastatin (LIPITOR) 80 MG tablet Take 1 tablet by mouth once daily 90 tablet 0   Blood Glucose Monitoring Suppl (FREESTYLE LITE) DEVI 1 Device by Does not apply route daily. 100 each 3   Blood Glucose Monitoring Suppl (FREESTYLE LITE) w/Device KIT Use daily as directed to test blood glucose. 1 kit 3   budesonide-formoterol (SYMBICORT) 160-4.5 MCG/ACT inhaler INHALE 2 PUFFS BY MOUTH TWICE A DAY 10.2 g 12   carvedilol (COREG) 25 MG tablet Take 1 tablet (25 mg total) by mouth 2 (two) times daily. 180 tablet 3   cetirizine (ZYRTEC) 10 MG tablet Take 10 mg by mouth daily.     Cholecalciferol (VITAMIN D) 50 MCG (2000 UT) tablet Take 2,000 Units by mouth daily.     dapagliflozin propanediol (FARXIGA) 5 MG TABS tablet Take 1 tablet (5 mg total) by mouth daily before breakfast. 90 tablet 3   ezetimibe (ZETIA) 10 MG tablet Take 1 tablet by mouth once daily 90 tablet 3   furosemide (LASIX) 20 MG tablet Take 1 tablet by mouth once daily 90 tablet 0   glucose blood (FREESTYLE LITE) test strip Use 1 strip to check blood glucose once daily as directed. 100 each 12   glucose blood (FREESTYLE TEST STRIPS) test strip 1 each by Other route daily. And lancets 1/day 100 each 12   Lancets (FREESTYLE) lancets Use 1 lancet once daily as directed to test blood glucose. 100 each 12   Lancets MISC Use as directed twice per day E11.9 200 each 12   Multiple Vitamins-Minerals (MULTIVITAMIN WITH MINERALS) tablet Take 1 tablet by mouth daily.     nitroGLYCERIN (NITROSTAT) 0.4 MG SL tablet Place 1 tablet (0.4 mg total) under the tongue every 5 (five) minutes  as needed for chest pain. 25 tablet 3   PEG-KCl-NaCl-NaSulf-Na Asc-C (PLENVU) 140 g SOLR Take 1 kit by mouth as directed. 3 each 0   PRESCRIPTION MEDICATION Inhale into the lungs at bedtime. CPAP     sacubitril-valsartan (ENTRESTO) 49-51 MG Take 1 tablet by mouth 2 (two) times daily. Keep appt for January 11, 2022  for additional refills 60 tablet 3   Semaglutide (RYBELSUS) 14 MG TABS Take 1 tablet by mouth once daily 90 tablet 3   spironolactone (ALDACTONE) 25 MG tablet Take 1  tablet by mouth once daily 90 tablet 0   No current facility-administered medications on file prior to visit.   No Known Allergies Family History  Problem Relation Age of Onset   Hypertension Other    Heart attack Mother    Heart disease Mother    Diabetes Mother    Heart attack Father    Heart disease Father    Diabetes Father    Asthma Brother    Diabetes Maternal Grandfather    PE: BP 130/82 (BP Location: Left Wrist, Patient Position: Sitting, Cuff Size: Normal)   Pulse 71   Ht _0  (1.753 m)   Wt (!) 379 lb 9.6 oz (172.2 kg)   SpO2 95%   BMI 56.06 kg/m  Wt Readings from Last 3 Encounters:  02/14/22 (!) 379 lb 9.6 oz (172.2 kg)  01/24/22 (!) 382 lb (173.3 kg)  12/29/21 (!) 386 lb (175.1 kg)   Constitutional: overweight, in NAD Eyes: no exophthalmos ENT: moist mucous membranes, no thyromegaly, no cervical lymphadenopathy Cardiovascular: RRR, No MRG, + mild nonpitting LE edema B Respiratory: CTA B Musculoskeletal: no deformities Skin: moist, warm, no rashes Neurological: no tremor with outstretched hands  ASSESSMENT: 1. DM2, non-insulin-dependent, uncontrolled, with complications - CAD - CHF - Nonischemic cardiomyopathy  2. HL  PLAN:  1. Patient with long-standing, uncontrolled diabetes, on oral antidiabetic regimen with metformin ER, SGLT2 inhibitor, and GLP-1 receptor agonist, with change in control.  Latest HbA1c was lower, at 6.9%, at goal.  At today's visit, HbA1c is 7.4% (higher).   Upon questioning, he relaxed his diet since last visit with Dr. Loanne Drilling.  He is also drinking regular sodas (Pepsi) when he is working outside.  He is a Development worker, international aid.  He feels that he develops muscle cramps if he only drinks water. -At today's visit, I strongly advised him to stop Pepsi and any sweet drinks.  He could try Gatorade Zero instead, if he cannot drink water. I believe that if he works on his diet and especially on his reducing sweet drinks, will get back to the previous diabetic control.  -He is only checking his sugars approximately 3 times a week.  We discussed about checking every day, and rotating check times throughout the day, to understand his diabetes better. -At next visit, we could either increase Wilder Glade (I would not want to do this now since he is working outside in the heat) or switch to [employer]. - I suggested to:  Patient Instructions  Stop regular sodas.  Please continue: - Metformin ER 2000 mg in am - Farxiga 5 mg before breakfast - Rybelsus 14 mg before breakfast  Please check sugars 1x a day, rotating check times.  - check sugars at different times of the day - check 1x a day, rotating checks - discussed about CBG targets for treatment: 80-130 mg/dL before meals and <180 mg/dL after meals; target HbA1c <7%. - given foot care handout  - given instructions for hypoglycemia management "15-15 rule"  - advised for yearly eye exams  - Return to clinic in 3 mo with sugar log   2. HL - Reviewed latest lipid panel from 08/2021: LDL at goal, triglycerides also at goal, HDL slightly low: Lab Results  Component Value Date   CHOL 96 08/18/2021   HDL 30.80 (L) 08/18/2021   LDLCALC 46 08/18/2021   LDLDIRECT 183.6 08/02/2011   TRIG 96.0 08/18/2021   CHOLHDL 3 08/18/2021  - Continues lipitor 80 mg daily and Zetia 10 mg daily without side  effects.  Philemon Kingdom, MD PhD [facility]

## 2022-02-23 ENCOUNTER — Other Ambulatory Visit: Payer: Self-pay | Admitting: Cardiology

## 2022-02-23 DIAGNOSIS — G4733 Obstructive sleep apnea (adult) (pediatric): Secondary | ICD-10-CM | POA: Diagnosis not present

## 2022-02-23 DIAGNOSIS — I429 Cardiomyopathy, unspecified: Secondary | ICD-10-CM

## 2022-02-28 ENCOUNTER — Ambulatory Visit: Payer: Federal, State, Local not specified - PPO | Admitting: Internal Medicine

## 2022-02-28 VITALS — BP 124/72 | HR 70 | Temp 97.8°F | Ht 69.0 in | Wt 376.0 lb

## 2022-02-28 DIAGNOSIS — E559 Vitamin D deficiency, unspecified: Secondary | ICD-10-CM | POA: Diagnosis not present

## 2022-02-28 DIAGNOSIS — E1159 Type 2 diabetes mellitus with other circulatory complications: Secondary | ICD-10-CM | POA: Diagnosis not present

## 2022-02-28 DIAGNOSIS — Z23 Encounter for immunization: Secondary | ICD-10-CM | POA: Diagnosis not present

## 2022-02-28 DIAGNOSIS — M79605 Pain in left leg: Secondary | ICD-10-CM | POA: Diagnosis not present

## 2022-02-28 DIAGNOSIS — I10 Essential (primary) hypertension: Secondary | ICD-10-CM | POA: Diagnosis not present

## 2022-02-28 DIAGNOSIS — E1165 Type 2 diabetes mellitus with hyperglycemia: Secondary | ICD-10-CM

## 2022-02-28 NOTE — Progress Notes (Unsigned)
Patient ID: Tyler [name], male   DOB: Apr 24, 1976, 45 y.o.   MRN: [id number]        Chief Complaint: follow up fall to knees, with persistent new left leg pain and swelling       HPI:  Tyler [name] is a 46 y.o. male here with c/o unfortunate trip and fall to both anterior knees on July 8 without fracture, but did have what sounds like a significant traumatic arthritis of the left knee with swelling to the distal leg after that,   Left knee pain and swelling have resolved, however still has over 1 wk of persistent left leg pain and swelling below the knee.  Pt denies chest pain, increased sob or doe, wheezing, orthopnea, PND, palpitations, dizziness or syncope.   Pt denies polydipsia, polyuria, or new focal neuro s/s.    Pt denies fever, wt loss, night sweats, loss of appetite, or other constitutional symptoms, but also S/p CAP episode July 2023.  Due for flu shot Wt Readings from Last 3 Encounters:  02/28/22 (!) 376 lb (170.6 kg)  02/14/22 (!) 379 lb 9.6 oz (172.2 kg)  01/24/22 (!) 382 lb (173.3 kg)   BP Readings from Last 3 Encounters:  02/28/22 124/72  02/14/22 130/82  01/24/22 110/70         Past Medical History:  Diagnosis Date   ACHILLES TENDINITIS, BILATERAL 10/16/2008   ALLERGIC RHINITIS 08/13/2009   CARDIOMYOPATHY 04/21/2009   CHF (congestive heart failure) ([place])    CONGESTIVE HEART FAILURE 12/04/2007   CORONARY ARTERY DISEASE 12/04/2007   GERD (gastroesophageal reflux disease) 11/01/2011   HYPERLIPIDEMIA 03/12/2007   HYPERTENSION 03/12/2007   OBESITY 04/21/2009   Plantar fasciitis, bilateral 11/01/2011   SLEEP APNEA 03/12/2007   Past Surgical History:  Procedure Laterality Date   CARDIAC CATHETERIZATION N/A 12/19/2014   Procedure: Left Heart Cath and Coronary Angiography;  Surgeon: Belva Crome, MD;  Location: [facility];  Service: Cardiovascular;  Laterality: N/A;   COLONOSCOPY WITH PROPOFOL N/A 11/19/2020   Procedure: COLONOSCOPY WITH PROPOFOL;  Surgeon: Milus Banister, MD;  Location[facility];  Service: Endoscopy;  Laterality: N/A;   NO PAST SURGERIES      reports that he has never smoked. He has never used smokeless tobacco. He reports that he does not drink alcohol and does not use drugs. family history includes Asthma in his brother; Diabetes in his father, maternal grandfather, and mother; Heart attack in his father and mother; Heart disease in his father and mother; Hypertension in an other family member. No Known Allergies Current Outpatient Medications on File Prior to Visit  Medication Sig Dispense Refill   albuterol (VENTOLIN HFA) 108 (90 Base) MCG/ACT inhaler Inhale 2 puffs into the lungs every 6 (six) hours as needed for wheezing. 6.7 g 11   amLODipine (NORVASC) 10 MG tablet TAKE 1 TABLET BY MOUTH ONCE DAILY . APPOINTMENT REQUIRED FOR FUTURE REFILLS 90 tablet 3   aspirin EC 81 MG tablet Take 81 mg by mouth daily.     atorvastatin (LIPITOR) 80 MG tablet Take 1 tablet by mouth once daily 90 tablet 0   Blood Glucose Monitoring Suppl (FREESTYLE LITE) DEVI 1 Device by Does not apply route daily. 100 each 3   Blood Glucose Monitoring Suppl (FREESTYLE LITE) w/Device KIT Use daily as directed to test blood glucose. 1 kit 3   budesonide-formoterol (SYMBICORT) 160-4.5 MCG/ACT inhaler INHALE 2 PUFFS BY MOUTH TWICE A DAY 10.2 g 12  carvedilol (COREG) 25 MG tablet Take 1 tablet (25 mg total) by mouth 2 (two) times daily. 180 tablet 3   cetirizine (ZYRTEC) 10 MG tablet Take 10 mg by mouth daily.     Cholecalciferol (VITAMIN D) 50 MCG (2000 UT) tablet Take 2,000 Units by mouth daily.     dapagliflozin propanediol (FARXIGA) 5 MG TABS tablet Take 1 tablet (5 mg total) by mouth daily before breakfast. 90 tablet 3   ezetimibe (ZETIA) 10 MG tablet Take 1 tablet by mouth once daily 90 tablet 3   furosemide (LASIX) 20 MG tablet Take 1 tablet by mouth once daily 90 tablet 0   glucose blood (FREESTYLE LITE) test strip Use 1 strip to check blood glucose once  daily as directed. 100 each 12   glucose blood (FREESTYLE TEST STRIPS) test strip 1 each by Other route daily. And lancets 1/day 100 each 12   Lancets (FREESTYLE) lancets Use 1 lancet once daily as directed to test blood glucose. 100 each 12   Lancets MISC Use as directed twice per day E11.9 200 each 12   Multiple Vitamins-Minerals (MULTIVITAMIN WITH MINERALS) tablet Take 1 tablet by mouth daily.     nitroGLYCERIN (NITROSTAT) 0.4 MG SL tablet Place 1 tablet (0.4 mg total) under the tongue every 5 (five) minutes as needed for chest pain. 25 tablet 3   PEG-KCl-NaCl-NaSulf-Na Asc-C (PLENVU) 140 g SOLR Take 1 kit by mouth as directed. 3 each 0   PRESCRIPTION MEDICATION Inhale into the lungs at bedtime. CPAP     sacubitril-valsartan (ENTRESTO) 49-51 MG Take 1 tablet by mouth 2 (two) times daily. Keep appt for January 11, 2022  for additional refills 60 tablet 3   Semaglutide (RYBELSUS) 14 MG TABS Take 1 tablet by mouth once daily 90 tablet 3   spironolactone (ALDACTONE) 25 MG tablet Take 1 tablet by mouth once daily 90 tablet 3   No current facility-administered medications on file prior to visit.        ROS:  All others reviewed and negative.  Objective        PE:  BP 124/72 (BP Location: Right Arm, Patient Position: Sitting, Cuff Size: Large)   Pulse 70   Temp 97.8 F (36.6 C) (Oral)   Ht 5' 9" (1.753 m)   Wt (!) 376 lb (170.6 kg)   SpO2 94%   BMI 55.53 kg/m                 Constitutional: Pt appears in NAD               HENT: Head: NCAT.                Right Ear: External ear normal.                 Left Ear: External ear normal.                Eyes: . Pupils are equal, round, and reactive to light. Conjunctivae and EOM are normal               Nose: without d/c or deformity               Neck: Neck supple. Gross normal ROM               Cardiovascular: Normal rate and regular rhythm.                 Pulmonary/Chest: Effort normal and breath sounds without   rales or wheezing.                                Neurological: Pt is alert. At baseline orientation, motor grossly intact               Skin: Skin is warm. No rashes, no other new lesions, LE edema - trace to 1+ LLe to knee, with mild tender post calf without cords o/w neurovasc intact               Psychiatric: Pt behavior is normal without agitation   Micro: none  Cardiac tracings I have personally interpreted today:  none  Pertinent Radiological findings (summarize): none   Lab Results  Component Value Date   WBC 8.6 08/18/2021   HGB 13.6 08/18/2021   HCT 41.6 08/18/2021   PLT 225.0 08/18/2021   GLUCOSE 102 (H) 08/18/2021   CHOL 96 08/18/2021   TRIG 96.0 08/18/2021   HDL 30.80 (L) 08/18/2021   LDLDIRECT 183.6 08/02/2011   LDLCALC 46 08/18/2021   ALT 23 08/18/2021   AST 17 08/18/2021   NA 139 08/18/2021   K 4.0 08/18/2021   CL 104 08/18/2021   CREATININE 1.08 08/18/2021   BUN 16 08/18/2021   CO2 27 08/18/2021   TSH 2.01 08/18/2021   PSA 0.64 08/18/2021   INR 1.2 (H) 12/15/2014   HGBA1C 7.4 (A) 02/14/2022   MICROALBUR 1.0 08/18/2021   Assessment/Plan:  Tyler [name] is a 46 y.o. Other or two or more races [6] Black or African American [2] male with  has a past medical history of ACHILLES TENDINITIS, BILATERAL (10/16/2008), ALLERGIC RHINITIS (08/13/2009), CARDIOMYOPATHY (04/21/2009), CHF (congestive heart failure) ([place]), CONGESTIVE HEART FAILURE (12/04/2007), CORONARY ARTERY DISEASE (12/04/2007), GERD (gastroesophageal reflux disease) (11/01/2011), HYPERLIPIDEMIA (03/12/2007), HYPERTENSION (03/12/2007), OBESITY (04/21/2009), Plantar fasciitis, bilateral (11/01/2011), and SLEEP APNEA (03/12/2007).  Leg pain, left With persistent swelling - for venous doppler exam, and refer sport medicine  Essential hypertension BP Readings from Last 3 Encounters:  02/28/22 124/72  02/14/22 130/82  01/24/22 110/70   Stable, pt to continue medical treatment norvasc 10 mg qd, coreg 25 mg bid   Poorly controlled type 2  diabetes mellitus with circulatory disorder (HCC) Lab Results  Component Value Date   HGBA1C 7.4 (A) 02/14/2022   uncontrolled, pt to continue current medical treatment farxiga 5 mg and rybelsus 14 mg as declines change for now, has lost several lbs recently    Vitamin D deficiency Last vitamin D Lab Results  Component Value Date   VD25OH 36.50 08/18/2021   Low, reminded to start oral replacement  Followup: Return in about 6 months (around 08/29/2022).  Cathlean Cower, MD 03/01/2022 7:34 PM [facility]

## 2022-02-28 NOTE — Patient Instructions (Signed)
You had the flu shot today  Please continue all other medications as before, and refills have been done if requested.  Please have the pharmacy call with any other refills you may need.  Please continue your efforts at being more active, low cholesterol diet, and weight control.  You are otherwise up to date with prevention measures today.  Please keep your appointments with your specialists as you may have planned  Please make an Appointment to return in 6 months, or sooner if needed

## 2022-03-01 ENCOUNTER — Encounter: Payer: Self-pay | Admitting: Internal Medicine

## 2022-03-01 NOTE — Assessment & Plan Note (Signed)
With persistent swelling - for venous doppler exam, and refer sport medicine

## 2022-03-01 NOTE — Assessment & Plan Note (Signed)
Last vitamin D Lab Results  Component Value Date   VD25OH 36.50 08/18/2021   Low, reminded to start oral replacement

## 2022-03-01 NOTE — Assessment & Plan Note (Signed)
Lab Results  Component Value Date   HGBA1C 7.4 (A) 02/14/2022   uncontrolled, pt to continue current medical treatment farxiga 5 mg and rybelsus 14 mg as declines change for now, has lost several lbs recently

## 2022-03-01 NOTE — Assessment & Plan Note (Signed)
BP Readings from Last 3 Encounters:  02/28/22 124/72  02/14/22 130/82  01/24/22 110/70   Stable, pt to continue medical treatment norvasc 10 mg qd, coreg 25 mg bid

## 2022-03-02 ENCOUNTER — Ambulatory Visit (HOSPITAL_COMMUNITY)
Admission: RE | Admit: 2022-03-02 | Discharge: 2022-03-02 | Disposition: A | Discharge status: Home / Self Care | Payer: Federal, State, Local not specified - PPO | Source: Ambulatory Visit | Attending: Cardiovascular Disease | Admitting: Cardiovascular Disease

## 2022-03-02 DIAGNOSIS — R600 Localized edema: Secondary | ICD-10-CM | POA: Diagnosis not present

## 2022-03-02 DIAGNOSIS — M79605 Pain in left leg: Secondary | ICD-10-CM | POA: Insufficient documentation

## 2022-03-07 ENCOUNTER — Ambulatory Visit: Payer: Federal, State, Local not specified - PPO | Admitting: Family Medicine

## 2022-03-07 ENCOUNTER — Ambulatory Visit (INDEPENDENT_AMBULATORY_CARE_PROVIDER_SITE_OTHER): Payer: Federal, State, Local not specified - PPO

## 2022-03-07 ENCOUNTER — Ambulatory Visit: Payer: Self-pay

## 2022-03-07 VITALS — BP 132/80 | HR 71 | Ht 69.0 in | Wt 372.0 lb

## 2022-03-07 DIAGNOSIS — M25562 Pain in left knee: Secondary | ICD-10-CM | POA: Diagnosis not present

## 2022-03-07 DIAGNOSIS — M79605 Pain in left leg: Secondary | ICD-10-CM

## 2022-03-07 NOTE — Patient Instructions (Addendum)
Thank you for coming in today.   Please get an Xray today before you leave   You received an injection today. Seek immediate medical attention if the joint becomes red, extremely painful, or is oozing fluid.   Please use Voltaren gel (Generic Diclofenac Gel) up to 4x daily for pain as needed.  This is available over-the-counter as both the name brand Voltaren gel and the generic diclofenac gel.   Check back in 6 weeks 

## 2022-03-07 NOTE — Progress Notes (Unsigned)
I, [name], LAT, ATC acting as a scribe for [name], MD.  Subjective:    CC: L knee pain  HPI: Pt is a 46 y/o male c/o L knee pain. On 7/8, pt tripped going down concrete stairs, missing the last step, and fell landing on both knees. R knee pain and swelling has resolved, however over this past week pt c/o L lower leg pain and swelling. Pt was seen for this complaint by his PCP on 02/28/22 and a LE vascular US was obtained. Today, pt locates pain to the anterior and posterior aspect of the L knee. 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   R Knee swelling: yes Mechanical symptoms: yes Aggravates: transitioning to stand Treatments tried: motrin,   Dx testing: 03/02/22 L LE vasc US 01/05/21 L knee XR  Pertinent review of Systems: No fevers or chills  Relevant historical information: Hypertension, obesity, diabetes, heart failure,   Objective:    Vitals:   03/07/22 1457  BP: 132/80  Pulse: 71  SpO2: 95%   General: Well Developed, well nourished, and in no acute distress.   MSK: Left knee: Mild effusion normal motion with crepitation. Tender palpation medial joint line. Stable ligamentous exam. Intact strength.  Lab and Radiology Results  Procedure: Real-time Ultrasound Guided Injection of left knee superior lateral patellar space Device: Philips Affiniti 50G Images permanently stored and available for review in PACS Verbal informed consent obtained.  Discussed risks and benefits of procedure. Warned about infection, bleeding, hyperglycemia damage to structures among others. Patient expresses understanding and agreement Time-out conducted.   Noted no overlying erythema, induration, or other signs of local infection.   Skin prepped in a sterile fashion.   Local anesthesia: Topical Ethyl chloride.   With sterile technique and under real time ultrasound guidance: 40 mg of Kenalog and 2 mL of Marcaine injected into knee joint. Fluid seen entering the joint  capsule.   Completed without difficulty   Pain immediately resolved suggesting accurate placement of the medication.   Advised to call if fevers/chills, erythema, induration, drainage, or persistent bleeding.   Images permanently stored and available for review in the ultrasound unit.  Impression: Technically successful ultrasound guided injection.  Normal  X-ray images left knee obtained today personally and independently interpreted Mild medial DJD. No acute fracture.  Await formal radiology review.     Impression and Recommendations:    Assessment and Plan: 46 y.o. male with left knee pain thought to be due to exacerbation of DJD.  Plan for steroid injection today.  Recommend also Voltaren gel.  Check back in 6 weeks.  Mild  PDMP not reviewed this encounter. Orders Placed This Encounter  Procedures   [place] LIMITED JOINT SPACE STRUCTURES LOW LEFT(NO LINKED CHARGES)    Order Specific Question:   Reason for Exam (SYMPTOM  OR DIAGNOSIS REQUIRED)    Answer:   left knee pain    Order Specific Question:   Preferred imaging location?    Answer:   [place]   DG Knee AP/LAT W/Sunrise Left    Standing Status:   Future    Number of Occurrences:   1    Standing Expiration Date:   04/06/2022    Order Specific Question:   Reason for Exam (SYMPTOM  OR DIAGNOSIS REQUIRED)    Answer:   left knee pain    Order Specific Question:   Preferred imaging location?    Answer:   [facility]  No orders of the defined types were placed in this encounter.   Discussed warning signs or symptoms. Please see discharge instructions. Patient expresses understanding.   The above documentation has been reviewed and is accurate and complete [name], M.D.

## 2022-03-09 ENCOUNTER — Encounter (HOSPITAL_COMMUNITY): Payer: Federal, State, Local not specified - PPO

## 2022-03-09 NOTE — Progress Notes (Signed)
Left knee x-ray shows some arthritis.

## 2022-03-17 DIAGNOSIS — G4733 Obstructive sleep apnea (adult) (pediatric): Secondary | ICD-10-CM | POA: Diagnosis not present

## 2022-03-18 DIAGNOSIS — J209 Acute bronchitis, unspecified: Secondary | ICD-10-CM | POA: Diagnosis not present

## 2022-03-18 DIAGNOSIS — U071 COVID-19: Secondary | ICD-10-CM | POA: Diagnosis not present

## 2022-03-21 ENCOUNTER — Other Ambulatory Visit (HOSPITAL_COMMUNITY): Payer: Self-pay

## 2022-03-21 ENCOUNTER — Other Ambulatory Visit (HOSPITAL_COMMUNITY): Payer: Federal, State, Local not specified - PPO

## 2022-03-25 ENCOUNTER — Ambulatory Visit (HOSPITAL_COMMUNITY): Payer: Federal, State, Local not specified - PPO | Attending: Cardiology

## 2022-03-25 DIAGNOSIS — G4733 Obstructive sleep apnea (adult) (pediatric): Secondary | ICD-10-CM | POA: Diagnosis not present

## 2022-03-25 DIAGNOSIS — I428 Other cardiomyopathies: Secondary | ICD-10-CM | POA: Diagnosis not present

## 2022-03-28 ENCOUNTER — Other Ambulatory Visit: Payer: Self-pay | Admitting: Cardiology

## 2022-03-28 DIAGNOSIS — I429 Cardiomyopathy, unspecified: Secondary | ICD-10-CM

## 2022-03-29 LAB — ECHOCARDIOGRAM COMPLETE
Area-P 1/2: 2.66 cm2
Calc EF: 51 %
S' Lateral: 3.7 cm
Single Plane A2C EF: 50.1 %
Single Plane A4C EF: 47.5 %

## 2022-04-16 DIAGNOSIS — G4733 Obstructive sleep apnea (adult) (pediatric): Secondary | ICD-10-CM | POA: Diagnosis not present

## 2022-04-18 ENCOUNTER — Other Ambulatory Visit: Payer: Self-pay | Admitting: Cardiology

## 2022-04-18 ENCOUNTER — Ambulatory Visit (INDEPENDENT_AMBULATORY_CARE_PROVIDER_SITE_OTHER): Payer: Federal, State, Local not specified - PPO | Admitting: Family Medicine

## 2022-04-18 VITALS — BP 128/82 | HR 62 | Ht 69.0 in | Wt 373.0 lb

## 2022-04-18 DIAGNOSIS — M25562 Pain in left knee: Secondary | ICD-10-CM

## 2022-04-18 DIAGNOSIS — G8929 Other chronic pain: Secondary | ICD-10-CM | POA: Diagnosis not present

## 2022-04-18 DIAGNOSIS — E785 Hyperlipidemia, unspecified: Secondary | ICD-10-CM

## 2022-04-18 NOTE — Progress Notes (Unsigned)
   I, [name], LAT, ATC acting as a scribe for Tyler Leader, MD.  Tyler [name] is a 46 y.o. male who presents to [place] at [facility] today for follow-up left knee pain.  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 On 7/8, pt tripped going down concrete stairs, missing the last step, and fell landing on both knees.  Patient was last seen by Dr. [name] on 03/07/2022 and was given a left knee steroid injection and advised to use Voltaren gel.  Today, patient reports L leg is feeling good, no problems to reports.   Dx testing: 03/07/22 L knee XR 03/02/22 L LE vasc US 01/05/21 L knee XR  Pertinent review of systems: No fevers or chills  Relevant historical information: Obesity   Exam:  BP 128/82   Pulse 62   Ht 5\' 9"  (1.753 m)   Wt (!) 373 lb (169.2 kg)   SpO2 95%   BMI 55.08 kg/m  General: Well Developed, well nourished, and in no acute distress.   MSK: Left knee normal-appearing normal motion with mild crepitations.  Intact strength.  Nontender.    Lab and Radiology Results EXAM: LEFT KNEE 3 VIEWS   COMPARISON:  01/05/2021   FINDINGS: No acute fracture or dislocation is noted. Degenerative changes are noted most marked in the patellofemoral space. No joint effusion is noted. No soft tissue abnormality is seen.   IMPRESSION: Degenerative change without acute abnormality.     Electronically Signed   By: Inez Catalina M.D.   On: 03/09/2022 00:53   I, Tyler [name], personally (independently) visualized and performed the interpretation of the images attached in this note.     Assessment and Plan: 46 y.o. male with left knee pain thought to be due to exacerbation of DJD. Pain has improved significantly with injection and home exercise program.  Check back as needed.  Could repeat steroid injection at the 31-month mark which would be around or after December 18. Continue to work on weight loss.  Weight loss is going to be helpful for this problem  going forward into the future.     Discussed warning signs or symptoms. Please see discharge instructions. Patient expresses understanding.   The above documentation has been reviewed and is accurate and complete Tyler [name], M.D.

## 2022-04-18 NOTE — Patient Instructions (Signed)
Thank you for coming in today.   Recheck as needed.   I can repeat the cortisone injection in mid Dec if needed.

## 2022-04-25 DIAGNOSIS — G4733 Obstructive sleep apnea (adult) (pediatric): Secondary | ICD-10-CM | POA: Diagnosis not present

## 2022-05-17 DIAGNOSIS — G4733 Obstructive sleep apnea (adult) (pediatric): Secondary | ICD-10-CM | POA: Diagnosis not present

## 2022-06-02 ENCOUNTER — Ambulatory Visit: Payer: Self-pay

## 2022-06-02 ENCOUNTER — Ambulatory Visit (INDEPENDENT_AMBULATORY_CARE_PROVIDER_SITE_OTHER): Payer: Federal, State, Local not specified - PPO

## 2022-06-02 ENCOUNTER — Other Ambulatory Visit: Payer: Self-pay | Admitting: Cardiology

## 2022-06-02 ENCOUNTER — Ambulatory Visit (INDEPENDENT_AMBULATORY_CARE_PROVIDER_SITE_OTHER): Payer: Federal, State, Local not specified - PPO | Admitting: Family Medicine

## 2022-06-02 ENCOUNTER — Other Ambulatory Visit (HOSPITAL_COMMUNITY): Payer: Self-pay

## 2022-06-02 VITALS — BP 120/82 | HR 72 | Ht 69.0 in | Wt 371.0 lb

## 2022-06-02 DIAGNOSIS — M25561 Pain in right knee: Secondary | ICD-10-CM

## 2022-06-02 DIAGNOSIS — M7121 Synovial cyst of popliteal space [Baker], right knee: Secondary | ICD-10-CM | POA: Diagnosis not present

## 2022-06-02 MED ORDER — ENTRESTO 49-51 MG PO TABS
1.0000 | ORAL_TABLET | Freq: Two times a day (BID) | ORAL | 3 refills | Status: DC
  Filled 2022-06-02: qty 60, 30d supply, fill #0
  Filled 2022-08-16: qty 60, 30d supply, fill #1
  Filled 2022-10-25: qty 60, 30d supply, fill #2

## 2022-06-02 NOTE — Progress Notes (Signed)
I, Tyler [name], LAT, ATC acting as a scribe for Tyler Graham, MD.  Tyler [name] is a 46 y.o. male who presents to [facility] today for R knee pain.  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  Patient was previously seen by Dr. Denyse [name] on 04/18/2022 for his left knee.  Today, patient reports R knee pain ongoing for about 1 week.  Patient locates pain to the anterior, posterior aspect of the R knee and into the R calf.   R Knee swelling: yes- has improved some Mechanical symptoms: yes Aggravates: walking Treatments tried: motrin, Tylenol  Pertinent review of systems: No fevers or chills  Relevant historical information: heart failure   Exam:  BP 120/82   Pulse 72   Ht 5\' 9"  (1.753 m)   Wt (!) 371 lb (168.3 kg)   SpO2 98%   BMI 54.79 kg/m  General: Well Developed, well nourished, and in no acute distress.   MSK: Right knee: Moderate effusion.  Otherwise normal-appearing no erythema. The right calf is somewhat swollen at the posterior aspect of the calf and mildly tender palpation. Normal knee motion pain with flexion.  Intact strength.  Stable ligamentous exam. Tender palpation medial joint line.   Lab and Radiology Results  Procedure: Real-time Ultrasound Guided Injection of the right knee superior lateral patellar space Device: Philips Affiniti 50G Images permanently stored and available for review in PACS Ultrasound evaluation prior to injection reveals a moderate knee effusion.  Additionally there is a moderate Baker's cyst that has extended into the calf with evidence of a Baker's cyst rupture with drainage into the calf. Verbal informed consent obtained.  Discussed risks and benefits of procedure. Warned about infection, bleeding, hyperglycemia damage to structures among others. Patient expresses understanding and agreement Time-out conducted.   Noted no overlying erythema, induration, or other signs of local infection.   Skin  prepped in a sterile fashion.   Local anesthesia: Topical Ethyl chloride.   With sterile technique and under real time ultrasound guidance: 40 mg of Kenalog and 2 mL Marcaine injected into knee joint. Fluid seen entering the joint capsule.   Completed without difficulty   Pain immediately resolved suggesting accurate placement of the medication.   Advised to call if fevers/chills, erythema, induration, drainage, or persistent bleeding.   Images permanently stored and available for review in the ultrasound unit.  Impression: Technically successful ultrasound guided injection.   X-ray images right knee obtained today personally and independently interpreted Mild medial compartment DJD.  Large osteophyte inferior patellar pole. No acute fractures Await formal radiology review     Assessment and Plan: 46 y.o. male with right knee pain due to exacerbation of DJD.  Patient has a moderate knee effusion and a moderate Baker's cyst.  The Baker's cyst is draining the calf which is causing calf pain and swelling.  We may need to aspirate the Baker's cyst in the future.  He will keep me updated.  For now plan for steroid injection into the knee.   PDMP not reviewed this encounter. Orders Placed This Encounter  Procedures   52 LIMITED JOINT SPACE STRUCTURES LOW RIGHT(NO LINKED CHARGES)    Order Specific Question:   Reason for Exam (SYMPTOM  OR DIAGNOSIS REQUIRED)    Answer:   right knee pain    Order Specific Question:   Preferred imaging location?    Answer:   [facility] Knee AP/LAT W/Sunrise Right  Standing Status:   Future    Number of Occurrences:   1    Standing Expiration Date:   07/03/2022    Order Specific Question:   Reason for Exam (SYMPTOM  OR DIAGNOSIS REQUIRED)    Answer:   right knee pain    Order Specific Question:   Preferred imaging location?    Answer:   [facility]   No orders of the defined types were placed in this  encounter.    Discussed warning signs or symptoms. Please see discharge instructions. Patient expresses understanding.   The above documentation has been reviewed and is accurate and complete Tyler [name], M.D.

## 2022-06-02 NOTE — Patient Instructions (Signed)
Thank you for coming in today.   Please get an Xray today before you leave   Call or go to the ER if you develop a large red swollen joint with extreme pain or oozing puss.    I can get the ultrasound to look for clots if needed but I think the source of your leg swelling is the Baker cyst in your knee that should get better with the cortisone shot today.

## 2022-06-04 ENCOUNTER — Other Ambulatory Visit (HOSPITAL_COMMUNITY): Payer: Self-pay

## 2022-06-05 ENCOUNTER — Encounter (HOSPITAL_BASED_OUTPATIENT_CLINIC_OR_DEPARTMENT_OTHER): Payer: Self-pay | Admitting: Emergency Medicine

## 2022-06-05 ENCOUNTER — Other Ambulatory Visit: Payer: Self-pay

## 2022-06-05 ENCOUNTER — Emergency Department (HOSPITAL_BASED_OUTPATIENT_CLINIC_OR_DEPARTMENT_OTHER): Payer: Federal, State, Local not specified - PPO

## 2022-06-05 ENCOUNTER — Emergency Department (HOSPITAL_BASED_OUTPATIENT_CLINIC_OR_DEPARTMENT_OTHER)
Admission: EM | Admit: 2022-06-05 | Discharge: 2022-06-05 | Disposition: A | Discharge status: Home / Self Care | Payer: Federal, State, Local not specified - PPO | Attending: Emergency Medicine | Admitting: Emergency Medicine

## 2022-06-05 DIAGNOSIS — I509 Heart failure, unspecified: Secondary | ICD-10-CM | POA: Diagnosis not present

## 2022-06-05 DIAGNOSIS — M7121 Synovial cyst of popliteal space [Baker], right knee: Secondary | ICD-10-CM | POA: Insufficient documentation

## 2022-06-05 DIAGNOSIS — M7989 Other specified soft tissue disorders: Secondary | ICD-10-CM | POA: Diagnosis not present

## 2022-06-05 DIAGNOSIS — I110 Hypertensive heart disease with heart failure: Secondary | ICD-10-CM | POA: Insufficient documentation

## 2022-06-05 DIAGNOSIS — Z79899 Other long term (current) drug therapy: Secondary | ICD-10-CM | POA: Diagnosis not present

## 2022-06-05 DIAGNOSIS — I2510 Atherosclerotic heart disease of native coronary artery without angina pectoris: Secondary | ICD-10-CM | POA: Diagnosis not present

## 2022-06-05 DIAGNOSIS — R2241 Localized swelling, mass and lump, right lower limb: Secondary | ICD-10-CM | POA: Diagnosis not present

## 2022-06-05 LAB — CBC
HCT: 39.8 % (ref 39.0–52.0)
Hemoglobin: 13 g/dL (ref 13.0–17.0)
MCH: 28.7 pg (ref 26.0–34.0)
MCHC: 32.7 g/dL (ref 30.0–36.0)
MCV: 87.9 fL (ref 80.0–100.0)
Platelets: 247 10*3/uL (ref 150–400)
RBC: 4.53 MIL/uL (ref 4.22–5.81)
RDW: 15 % (ref 11.5–15.5)
WBC: 12.1 10*3/uL — ABNORMAL HIGH (ref 4.0–10.5)
nRBC: 0 % (ref 0.0–0.2)

## 2022-06-05 LAB — BASIC METABOLIC PANEL
Anion gap: 5 (ref 5–15)
BUN: 21 mg/dL — ABNORMAL HIGH (ref 6–20)
CO2: 27 mmol/L (ref 22–32)
Calcium: 8.5 mg/dL — ABNORMAL LOW (ref 8.9–10.3)
Chloride: 107 mmol/L (ref 98–111)
Creatinine, Ser: 1.04 mg/dL (ref 0.61–1.24)
GFR, Estimated: >60 mL/min (ref >60)
Glucose, Bld: 165 mg/dL — ABNORMAL HIGH (ref 70–99)
Potassium: 4.2 mmol/L (ref 3.5–5.1)
Sodium: 139 mmol/L (ref 135–145)

## 2022-06-05 MED ORDER — IBUPROFEN 400 MG PO TABS
600.0000 mg | ORAL_TABLET | Freq: Once | ORAL | Status: AC
  Administered 2022-06-05: 600 mg via ORAL
  Filled 2022-06-05: qty 1

## 2022-06-05 NOTE — ED Notes (Signed)
Pt advised the swelling began immediately after the injection, but it's become increasingly uncomfortable. MD that performed injection advised it would swell and it should resolve over time. Pt concerned since it continues to hurt. No tingling or numbness noted, but notable bruising below knee on calf.

## 2022-06-05 NOTE — Discharge Instructions (Addendum)
Evaluation of your right leg pain and swelling revealed that you have a Baker's cyst.  Ultrasound was also negative for a clot in your leg.  Recommend that you follow-up with Dr. Clare Gandy for further treatment evaluation.  His office is upstairs.  I have provided a document about Baker's cyst which was help guide you to treat your symptoms conservatively at home.  If you have worsening calf tenderness, new shortness of breath or chest pain please return to the emergency department for further evaluation.

## 2022-06-05 NOTE — ED Triage Notes (Signed)
Pt arrives pov, c/o RLE pain and swelling after Cortizone injection of knee x 4 days pta

## 2022-06-05 NOTE — ED Provider Notes (Signed)
[place] EMERGENCY DEPARTMENT Provider Note   CSN: [id number] Arrival date & time: 06/05/22  1135     History  Chief Complaint  Patient presents with   Leg Swelling   HPI Tyler [name] is a 46 y.o. male with dyslipidemia, hypertension, CAD, cardiomyopathy, and CHF presenting for leg swelling.  Started last week.  Leg swelling is located behind the right knee and extends down to the right calf.  Also endorses associated right calf tenderness.  States his right knee "was acting up" and he received a cortisone shot in that knee by his PCP 4 days ago.  Swelling started shortly after that behind his knee and now has radiated down his right calf.  The right calf is now tight and tender.  States that he can still walk and is able to bear weight.  Denies chest pain and shortness of breath.  HPI     Home Medications Prior to Admission medications   Medication Sig Start Date End Date Taking? Authorizing Provider  albuterol (VENTOLIN HFA) 108 (90 Base) MCG/ACT inhaler Inhale 2 puffs into the lungs every 6 (six) hours as needed for wheezing. 01/24/22   Baird Lyons D, MD  amLODipine (NORVASC) 10 MG tablet TAKE 1 TABLET BY MOUTH ONCE DAILY . APPOINTMENT REQUIRED FOR FUTURE REFILLS 02/24/22   Lelon Perla, MD  aspirin EC 81 MG tablet Take 81 mg by mouth daily.    [provider]  atorvastatin (LIPITOR) 80 MG tablet Take 1 tablet (80 mg total) by mouth daily. 04/20/22   Lelon Perla, MD  Blood Glucose Monitoring Suppl (FREESTYLE LITE) DEVI 1 Device by Does not apply route daily. 11/06/20   Renato Shin, MD  Blood Glucose Monitoring Suppl (FREESTYLE LITE) w/Device KIT Use daily as directed to test blood glucose. 11/06/20     budesonide-formoterol (SYMBICORT) 160-4.5 MCG/ACT inhaler INHALE 2 PUFFS BY MOUTH TWICE A DAY 01/24/22 01/24/23  Baird Lyons D, MD  carvedilol (COREG) 25 MG tablet Take 1 tablet (25 mg total) by mouth 2 (two) times daily. 11/05/21   Lelon Perla, MD  cetirizine (ZYRTEC) 10 MG tablet Take 10 mg by mouth daily.    [provider]  Cholecalciferol (VITAMIN D) 50 MCG (2000 UT) tablet Take 2,000 Units by mouth daily. 06/19/20   [provider]  dapagliflozin propanediol (FARXIGA) 5 MG TABS tablet Take 1 tablet (5 mg total) by mouth daily before breakfast. 07/19/21   Renato Shin, MD  ezetimibe (ZETIA) 10 MG tablet Take 1 tablet by mouth once daily 02/07/22   Lelon Perla, MD  furosemide (LASIX) 20 MG tablet Take 1 tablet by mouth once daily 03/29/22   Lelon Perla, MD  glucose blood (FREESTYLE LITE) test strip Use 1 strip to check blood glucose once daily as directed. 11/06/20     glucose blood (FREESTYLE TEST STRIPS) test strip 1 each by Other route daily. And lancets 1/day 11/06/20   Renato Shin, MD  Lancets (FREESTYLE) lancets Use 1 lancet once daily as directed to test blood glucose. 11/06/20     Lancets MISC Use as directed twice per day E11.9 09/23/20   Biagio Borg, MD  Multiple Vitamins-Minerals (MULTIVITAMIN WITH MINERALS) tablet Take 1 tablet by mouth daily.    [provider]  nitroGLYCERIN (NITROSTAT) 0.4 MG SL tablet Place 1 tablet (0.4 mg total) under the tongue every 5 (five) minutes as needed for chest pain. 12/10/14   Burtis Junes, NP  PEG-KCl-NaCl-NaSulf-Na Asc-C (PLENVU) 140 g SOLR Take 1 kit by mouth as directed. 10/12/20   Milus Banister, MD  PRESCRIPTION MEDICATION Inhale into the lungs at bedtime. CPAP    [provider]  sacubitril-valsartan (ENTRESTO) 49-51 MG Take 1 tablet by mouth 2 (two) times daily 06/02/22   Lelon Perla, MD  Semaglutide (RYBELSUS) 14 MG TABS Take 1 tablet by mouth once daily 02/07/22   Philemon Kingdom, MD  spironolactone (ALDACTONE) 25 MG tablet Take 1 tablet by mouth once daily 02/24/22   Lelon Perla, MD      Allergies    Patient has no known allergies.    Review of Systems   Review of Systems  Musculoskeletal:        Right lower  leg swelling and calf tenderness    Physical Exam Updated Vital Signs BP 133/78   Pulse 66   Temp 98 F (36.7 C) (Oral)   Resp 18   Wt (!) 167.8 kg   SpO2 99%   BMI 54.64 kg/m  Physical Exam Vitals and nursing note reviewed.  HENT:     Head: Normocephalic and atraumatic.     Mouth/Throat:     Mouth: Mucous membranes are moist.  Eyes:     General:        Right eye: No discharge.        Left eye: No discharge.     Conjunctiva/sclera: Conjunctivae normal.  Cardiovascular:     Rate and Rhythm: Normal rate and regular rhythm.     Pulses: Normal pulses.     Heart sounds: Normal heart sounds.  Pulmonary:     Effort: Pulmonary effort is normal.     Breath sounds: Normal breath sounds.  Abdominal:     General: Abdomen is flat.     Palpations: Abdomen is soft.  Musculoskeletal:     Right knee: Swelling present.     Left knee: Normal.     Right lower leg: Swelling present. No edema.     Left lower leg: No edema.     Comments: Right knee: Swelling noted about the medial aspect of the knee extending down to the medial aspect of the calf.  Right calf: Swelling about the medial calf along with small area of ecchymosis  Skin:    General: Skin is warm and dry.  Neurological:     General: No focal deficit present.  Psychiatric:        Mood and Affect: Mood normal.     ED Results / Procedures / Treatments   Labs (all labs ordered are listed, but only abnormal results are displayed) Labs Reviewed  BASIC METABOLIC PANEL - Abnormal; Notable for the following components:      Result Value   Glucose, Bld 165 (*)    BUN 21 (*)    Calcium 8.5 (*)    All other components within normal limits  CBC - Abnormal; Notable for the following components:   WBC 12.1 (*)    All other components within normal limits    EKG None  Radiology US Venous Img Lower Right (DVT Study)  Result Date: 06/05/2022 CLINICAL DATA:  Right lower extremity swelling EXAM: RIGHT LOWER EXTREMITY VENOUS  DOPPLER ULTRASOUND TECHNIQUE: Gray-scale sonography with compression, as well as color and duplex ultrasound, were performed to evaluate the deep venous system(s) from the level of the common femoral vein through the popliteal and proximal calf veins. COMPARISON:  None Available. FINDINGS: VENOUS Normal compressibility of the common  femoral, superficial femoral, and popliteal veins, as well as the visualized calf veins. Visualized portions of profunda femoral vein and great saphenous vein unremarkable. No filling defects to suggest DVT on grayscale or color Doppler imaging. Doppler waveforms show normal direction of venous flow, normal respiratory plasticity and response to augmentation. Limited views of the contralateral common femoral vein are unremarkable. OTHER There is a fluid collection within the right upper to mid calf that measures 9.5 x 3.0 x 4.0 cm, favored to represent a Baker's cyst. Limitations: none IMPRESSION: 1. No deep vein thrombosis in the right lower extremity. 2. Large right Baker's cyst. Electronically Signed   By: Beryle Flock M.D.   On: 06/05/2022 15:11    Procedures Procedures    Medications Ordered in ED Medications  ibuprofen (ADVIL) tablet 600 mg (600 mg Oral Given 06/05/22 1412)    ED Course/ Medical Decision Making/ A&P                           Medical Decision Making Amount and/or Complexity of Data Reviewed Labs: ordered.   46 year old male who is nontoxic, well-appearing and hemodynamically stable presenting for leg swelling.  Physical exam was noted for edema behind the right knee extending down into the right calf.  Given his presentation initial concern was possible DVT.  Prompted ultrasound which was negative for DVT in the right leg.  Also considered PE but unlikely given patient is neither short of breath or experiencing chest pain.    Treated with ibuprofen.  Advised conservative treatment at home.  Referred him to Dr. Clearance Coots for outpatient  management.   I personally reviewed and interpreted ultrasound which did reveal Baker's cyst.  I personally reviewed and interpreted labs which did reveal hyperglycemia, elevated BUN, and leukocytosis.       Final Clinical Impression(s) / ED Diagnoses Final diagnoses:  Synovial cyst of right popliteal space    Rx / DC Orders ED Discharge Orders     None         Harriet Pho, PA-C 06/05/22 1615    Long, Wonda Olds, MD 06/12/22 [phone]

## 2022-06-06 ENCOUNTER — Encounter: Payer: Self-pay | Admitting: Internal Medicine

## 2022-06-06 ENCOUNTER — Telehealth: Payer: Self-pay | Admitting: Family Medicine

## 2022-06-06 ENCOUNTER — Ambulatory Visit: Payer: Federal, State, Local not specified - PPO | Admitting: Internal Medicine

## 2022-06-06 VITALS — BP 130/76 | HR 73 | Ht 69.0 in | Wt 377.6 lb

## 2022-06-06 DIAGNOSIS — E1159 Type 2 diabetes mellitus with other circulatory complications: Secondary | ICD-10-CM

## 2022-06-06 DIAGNOSIS — E785 Hyperlipidemia, unspecified: Secondary | ICD-10-CM | POA: Diagnosis not present

## 2022-06-06 DIAGNOSIS — E1165 Type 2 diabetes mellitus with hyperglycemia: Secondary | ICD-10-CM

## 2022-06-06 LAB — POCT GLYCOSYLATED HEMOGLOBIN (HGB A1C): Hemoglobin A1C: 6.3 % — AB (ref 4.0–5.6)

## 2022-06-06 NOTE — Progress Notes (Signed)
Patient ID: Abimael Zeiter, male   DOB: 01/01/1976, 46 y.o.   MRN: [id number]  HPI: Pranay Hilbun is a 46 y.o.-year-old male, returning for follow-up for DM2, dx in 2013, non-insulin-dependent, uncontrolled, with complications (CAD, CHF, mild CKD). Pt. previously saw Dr. Loanne Drilling, but last visit with me 3.5 months ago.  Interim history: No increased urination, blurry vision, nausea, chest pain. He switched from regular to diet sodas since last visit.  He only has a regular soda once a week. He is off his meds x 3 days -he admits to taking them in the morning and did not take them later in the day.  Reviewed HbA1c: Lab Results  Component Value Date   HGBA1C 7.4 (A) 02/14/2022   HGBA1C 6.9 (A) 10/18/2021   HGBA1C 7.2 (A) 07/19/2021   HGBA1C 6.2 (A) 04/12/2021   HGBA1C 6.1 (A) 01/11/2021   HGBA1C 6.6 (H) 01/05/2021   HGBA1C 7.1 (A) 11/06/2020   HGBA1C 10.1 (A) 09/25/2020   HGBA1C 15.0 (H) 08/05/2020   HGBA1C 6.8 (A) 12/26/2018   Pt is on a regimen of: - Metformin ER 2000 in am - Farxiga 5 mg before breakfast - Rybelsus 14 mg before breakfast He had diarrhea with regular metformin.  Pt checks his sugars 0-1x a day and they are: - am: 107-130 >> 90-116  - 2h after b'fast: n/c - before lunch: n/c - 2h after lunch: n/c - before dinner: n/c - 2h after dinner: n/c - bedtime: n/c - nighttime: n/c Lowest sugar was 90 >> 90. Highest sugar was 130 >> 116.  Glucometer: Freestyle Lite  At last visit, he was drinking 1 Pepsi a day.  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 Water  caused muscle cramps.  I strongly advised him about stopping sweet drinks.  - + Mild CKD, last BUN/creatinine:  Lab Results  Component Value Date   BUN 21 (H) 06/05/2022   BUN 16 08/18/2021   CREATININE 1.04 06/05/2022   CREATININE 1.08 08/18/2021  He is on Entresto.  -+ HL; last set of lipids: Lab Results  Component Value Date   CHOL 96 08/18/2021   HDL 30.80 (L) 08/18/2021    LDLCALC 46 08/18/2021   LDLDIRECT 183.6 08/02/2011   TRIG 96.0 08/18/2021   CHOLHDL 3 08/18/2021  On Lipitor 80 mg daily and Zetia 10 mg daily.  - last eye exam was in 04/2021. No DR. Coming up 06/09/2022.  - no numbness and tingling in his feet.  Last foot exam 04/12/2021.  He also has a history of HTN, GERD, sleep apnea, asthmatic bronchitis.  He has a Financial trader.  ROS: + see HPI  Past Medical History:  Diagnosis Date   ACHILLES TENDINITIS, BILATERAL 10/16/2008   ALLERGIC RHINITIS 08/13/2009   CARDIOMYOPATHY 04/21/2009   CHF (congestive heart failure) ([place])    CONGESTIVE HEART FAILURE 12/04/2007   CORONARY ARTERY DISEASE 12/04/2007   GERD (gastroesophageal reflux disease) 11/01/2011   HYPERLIPIDEMIA 03/12/2007   HYPERTENSION 03/12/2007   OBESITY 04/21/2009   Plantar fasciitis, bilateral 11/01/2011   SLEEP APNEA 03/12/2007   Past Surgical History:  Procedure Laterality Date   CARDIAC CATHETERIZATION N/A 12/19/2014   Procedure: Left Heart Cath and Coronary Angiography;  Surgeon: Belva Crome, MD;  Location: [facility];  Service: Cardiovascular;  Laterality: N/A;   COLONOSCOPY WITH PROPOFOL N/A 11/19/2020   Procedure: COLONOSCOPY WITH PROPOFOL;  Surgeon: Milus Banister, MD;  Location: [facility];  Service: Endoscopy;  Laterality: N/A;   NO PAST SURGERIES     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[employer]    Employer: [employer]  Tobacco Use   Smoking status: Never   Smokeless tobacco: Never   Tobacco comments:    never  Scientific laboratory technician Use: Never used  Substance and Sexual Activity   Alcohol use: No   Drug use: No   Sexual activity: Not on file  Other Topics Concern   Not on file  Social History Narrative   Not on  file   Social Determinants of Health   Financial Resource Strain: Not on file  Food Insecurity: Not on file  Transportation Needs: Not on file  Physical Activity: Not on file  Stress: Not on file  Social Connections: Not on file  Intimate Partner Violence: Not on file   Current Outpatient Medications on File Prior to Visit  Medication Sig Dispense Refill   albuterol (VENTOLIN HFA) 108 (90 Base) MCG/ACT inhaler Inhale 2 puffs into the lungs every 6 (six) hours as needed for wheezing. 6.7 g 11   amLODipine (NORVASC) 10 MG tablet TAKE 1 TABLET BY MOUTH ONCE DAILY . APPOINTMENT REQUIRED FOR FUTURE REFILLS 90 tablet 3   aspirin EC 81 MG tablet Take 81 mg by mouth daily.     atorvastatin (LIPITOR) 80 MG tablet Take 1 tablet (80 mg total) by mouth daily. 90 tablet 2   Blood Glucose Monitoring Suppl (FREESTYLE LITE) DEVI 1 Device by Does not apply route daily. 100 each 3   Blood Glucose Monitoring Suppl (FREESTYLE LITE) w/Device KIT Use daily as directed to test blood glucose. 1 kit 3   budesonide-formoterol (SYMBICORT) 160-4.5 MCG/ACT inhaler INHALE 2 PUFFS BY MOUTH TWICE A DAY 10.2 g 12   carvedilol (COREG) 25 MG tablet Take 1 tablet (25 mg total) by mouth 2 (two) times daily. 180 tablet 3   cetirizine (ZYRTEC) 10 MG tablet Take 10 mg by mouth daily.     Cholecalciferol (VITAMIN D) 50 MCG (2000 UT) tablet Take 2,000 Units by mouth daily.     dapagliflozin propanediol (FARXIGA) 5 MG TABS tablet Take 1 tablet (5 mg total) by mouth daily before breakfast. 90 tablet 3   ezetimibe (ZETIA) 10 MG tablet Take 1 tablet by mouth once daily 90 tablet 3   furosemide (LASIX) 20 MG tablet Take 1 tablet by mouth once daily 90 tablet 2   glucose blood (FREESTYLE LITE) test strip Use 1 strip to check blood glucose once daily as directed. 100 each 12   glucose blood (FREESTYLE TEST STRIPS) test strip 1 each by Other route daily. And lancets 1/day 100 each 12   Lancets (FREESTYLE) lancets Use 1 lancet once daily  as directed to test blood glucose. 100 each 12   Lancets MISC Use as directed twice per day E11.9 200 each 12   Multiple Vitamins-Minerals (MULTIVITAMIN WITH MINERALS) tablet Take 1 tablet by mouth daily.     nitroGLYCERIN (NITROSTAT) 0.4 MG SL tablet Place 1 tablet (0.4 mg total) under the tongue every 5 (five) minutes as needed for chest pain. 25 tablet 3   PEG-KCl-NaCl-NaSulf-Na Asc-C (PLENVU) 140 g SOLR Take 1 kit by mouth as  directed. 3 each 0   PRESCRIPTION MEDICATION Inhale into the lungs at bedtime. CPAP     sacubitril-valsartan (ENTRESTO) 49-51 MG Take 1 tablet by mouth 2 (two) times daily 60 tablet 3   Semaglutide (RYBELSUS) 14 MG TABS Take 1 tablet by mouth once daily 90 tablet 3   spironolactone (ALDACTONE) 25 MG tablet Take 1 tablet by mouth once daily 90 tablet 3   No current facility-administered medications on file prior to visit.   No Known Allergies Family History  Problem Relation Age of Onset   Hypertension Other    Heart attack Mother    Heart disease Mother    Diabetes Mother    Heart attack Father    Heart disease Father    Diabetes Father    Asthma Brother    Diabetes Maternal Grandfather    PE: BP 130/76 (BP Location: Left Arm, Patient Position: Sitting, Cuff Size: Normal)   Pulse 73   Ht _0  (1.753 m)   Wt (!) 377 lb 9.6 oz (171.3 kg)   SpO2 98%   BMI 55.76 kg/m  Wt Readings from Last 3 Encounters:  06/06/22 (!) 377 lb 9.6 oz (171.3 kg)  06/05/22 (!) 370 lb (167.8 kg)  06/02/22 (!) 371 lb (168.3 kg)   Constitutional: overweight, in NAD Eyes: no exophthalmos ENT: no thyromegaly, no cervical lymphadenopathy Cardiovascular: RRR, No MRG, + mild nonpitting LE edema B Respiratory: CTA B Musculoskeletal: no deformities Skin:no rashes Neurological: no tremor with outstretched hands Diabetic Foot Exam - Simple   Simple Foot Form Diabetic Foot exam was performed with the following findings: Yes 06/06/2022  2:32 PM  Visual Inspection No deformities,  no ulcerations, no other skin breakdown bilaterally: Yes Sensation Testing Intact to touch and monofilament testing bilaterally: Yes Pulse Check Posterior Tibialis and Dorsalis pulse intact bilaterally: Yes Comments     ASSESSMENT: 1. DM2, non-insulin-dependent, uncontrolled, with complications - CAD - CHF - Nonischemic cardiomyopathy  2. HL  3.  Obesity class III  PLAN:  1. Patient with longstanding, uncontrolled, type 2 diabetes, on oral antidiabetic regimen with metformin, SGLT2 inhibitor and GLP-1 receptor agonist, with worsening control at last visit, when HbA1c returned 7.4%, increased from 6.9%.  He did relax his diet since the previous visit with Dr. Loanne Drilling and drinking regular sodas (Pepcid) when he was working outside.  We discussed about stopping regular sodas and any other sweet drinks and possibly using Gatorade 0 if absolutely needed, but otherwise, to drink water.  I did not change the rest of his regimen.  I also advised him to try to check sugars every day, rotating check times throughout the day, to gain a better understanding of blood sugar trends. -At today's visit, sugars appear to have improved significantly after stopping regular sodas.  I strongly advised him to continue.  He is not checking sugars later in the day and I advised him to try to rotate the CBG checks to understand patterns.  We can continue the same regimen for now. - I suggested to:  Patient Instructions  Please continue: - Metformin ER 2000 mg in am - Farxiga 5 mg before breakfast - Rybelsus 14 mg before breakfast  Please return for another visit in 3-4 months.  - we checked his HbA1c: 6.4% (lower) - advised to check sugars at different times of the day - 1x a day, rotating check times - advised for yearly eye exams >> he is not UTD but has an appointment coming up later today. -  return to clinic in 3-4 months  2. HL -Reviewed study [facility]'s lipid panel from 08/2021: LDL at goal,  triglycerides also at goal, HDL slightly low: Lab Results  Component Value Date   CHOL 96 08/18/2021   HDL 30.80 (L) 08/18/2021   LDLCALC 46 08/18/2021   LDLDIRECT 183.6 08/02/2011   TRIG 96.0 08/18/2021   CHOLHDL 3 08/18/2021  -Continue Lipitor 80 mg daily and Zetia 10 mg daily without side effects  3.  Obesity class III - continue SGLT 2 inhibitor and GLP-1 receptor agonist which should also help with weight loss -At last visit I also advised him to reduce sweet drinks -He lost 9 pounds since then, but gained 7 pounds back  Philemon Kingdom, MD PhD [facility]

## 2022-06-06 NOTE — Telephone Encounter (Signed)
Patient called stating that he is having right leg/calf pain and swelling. He was here for his right knee on 12/14 and feels like some of the fluid, etc from his knee has gone down to his calf. It was very painful this weekend so he went to the ED but there was no blood clot. He asked what next steps would be?  I advised him that Dr Denyse Amass is out of the office but could get him in to see Dr Jean Rosenthal if appropriate. He didn't know if it could be drained or what would be able to be done.  Please advise. (Okay to leave a message on patient's voicemail)

## 2022-06-06 NOTE — Patient Instructions (Addendum)
  Please continue: - Metformin ER 2000 mg in am - Farxiga 5 mg before breakfast - Rybelsus 14 mg before breakfast  Please return for another visit in 3-4 months.

## 2022-06-06 NOTE — Addendum Note (Signed)
Addended by: Eden Lathe on: 06/06/2022 02:53 PM   Modules accepted: Orders

## 2022-06-06 NOTE — Progress Notes (Deleted)
Benito Mccreedy D.[place] Phone: [phone]   Assessment and Plan:     There are no diagnoses linked to this encounter.  ***   Pertinent previous records reviewed include ***   Follow Up: ***     Subjective:   I, Nishi Neiswonger, am serving as a Education administrator for Doctor Glennon Mac  Chief Complaint: right knee pain   HPI:   06/02/2022 Tyler [name] is a 46 y.o. male who presents to [place] [facility] today for R knee pain.  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  Patient was previously seen by Dr. [name] on 04/18/2022 for his left knee.  Today, patient reports R knee pain ongoing for about 1 week.  Patient locates pain to the anterior, posterior aspect of the R knee and into the R calf.    R Knee swelling: yes- has improved some Mechanical symptoms: yes Aggravates: walking Treatments tried: motrin, Tylenol  06/06/22 Patient states   Relevant Historical Information: ***  Additional pertinent review of systems negative.   Current Outpatient Medications:    albuterol (VENTOLIN HFA) 108 (90 Base) MCG/ACT inhaler, Inhale 2 puffs into the lungs every 6 (six) hours as needed for wheezing., Disp: 6.7 g, Rfl: 11   amLODipine (NORVASC) 10 MG tablet, TAKE 1 TABLET BY MOUTH ONCE DAILY . APPOINTMENT REQUIRED FOR FUTURE REFILLS, Disp: 90 tablet, Rfl: 3   aspirin EC 81 MG tablet, Take 81 mg by mouth daily., Disp: , Rfl:    atorvastatin (LIPITOR) 80 MG tablet, Take 1 tablet (80 mg total) by mouth daily., Disp: 90 tablet, Rfl: 2   Blood Glucose Monitoring Suppl (FREESTYLE LITE) DEVI, 1 Device by Does not apply route daily., Disp: 100 each, Rfl: 3   Blood Glucose Monitoring Suppl (FREESTYLE LITE) w/Device KIT, Use daily as directed to test blood glucose., Disp: 1 kit, Rfl: 3   budesonide-formoterol (SYMBICORT) 160-4.5 MCG/ACT inhaler, INHALE 2 PUFFS BY MOUTH TWICE A DAY, Disp: 10.2 g,  Rfl: 12   carvedilol (COREG) 25 MG tablet, Take 1 tablet (25 mg total) by mouth 2 (two) times daily., Disp: 180 tablet, Rfl: 3   cetirizine (ZYRTEC) 10 MG tablet, Take 10 mg by mouth daily., Disp: , Rfl:    Cholecalciferol (VITAMIN D) 50 MCG (2000 UT) tablet, Take 2,000 Units by mouth daily., Disp: , Rfl:    dapagliflozin propanediol (FARXIGA) 5 MG TABS tablet, Take 1 tablet (5 mg total) by mouth daily before breakfast., Disp: 90 tablet, Rfl: 3   ezetimibe (ZETIA) 10 MG tablet, Take 1 tablet by mouth once daily, Disp: 90 tablet, Rfl: 3   furosemide (LASIX) 20 MG tablet, Take 1 tablet by mouth once daily, Disp: 90 tablet, Rfl: 2   glucose blood (FREESTYLE LITE) test strip, Use 1 strip to check blood glucose once daily as directed., Disp: 100 each, Rfl: 12   glucose blood (FREESTYLE TEST STRIPS) test strip, 1 each by Other route daily. And lancets 1/day, Disp: 100 each, Rfl: 12   Lancets (FREESTYLE) lancets, Use 1 lancet once daily as directed to test blood glucose., Disp: 100 each, Rfl: 12   Lancets MISC, Use as directed twice per day E11.9, Disp: 200 each, Rfl: 12   Multiple Vitamins-Minerals (MULTIVITAMIN WITH MINERALS) tablet, Take 1 tablet by mouth daily., Disp: , Rfl:    nitroGLYCERIN (NITROSTAT) 0.4 MG SL tablet, Place 1 tablet (0.4 mg total) under the tongue  every 5 (five) minutes as needed for chest pain., Disp: 25 tablet, Rfl: 3   PEG-KCl-NaCl-NaSulf-Na Asc-C (PLENVU) 140 g SOLR, Take 1 kit by mouth as directed., Disp: 3 each, Rfl: 0   PRESCRIPTION MEDICATION, Inhale into the lungs at bedtime. CPAP, Disp: , Rfl:    sacubitril-valsartan (ENTRESTO) 49-51 MG, Take 1 tablet by mouth 2 (two) times daily, Disp: 60 tablet, Rfl: 3   Semaglutide (RYBELSUS) 14 MG TABS, Take 1 tablet by mouth once daily, Disp: 90 tablet, Rfl: 3   spironolactone (ALDACTONE) 25 MG tablet, Take 1 tablet by mouth once daily, Disp: 90 tablet, Rfl: 3   Objective:     There were no vitals filed for this visit.    There  is no height or weight on file to calculate BMI.    Physical Exam:    ***   Electronically signed by:  Benito Mccreedy D.[facility] 12:37 PM 06/06/22

## 2022-06-07 ENCOUNTER — Ambulatory Visit (INDEPENDENT_AMBULATORY_CARE_PROVIDER_SITE_OTHER): Payer: Federal, State, Local not specified - PPO | Admitting: Sports Medicine

## 2022-06-07 ENCOUNTER — Ambulatory Visit: Payer: Federal, State, Local not specified - PPO | Admitting: Sports Medicine

## 2022-06-07 ENCOUNTER — Other Ambulatory Visit (HOSPITAL_COMMUNITY): Payer: Self-pay

## 2022-06-07 VITALS — HR 76 | Ht 69.0 in | Wt 370.0 lb

## 2022-06-07 DIAGNOSIS — M7989 Other specified soft tissue disorders: Secondary | ICD-10-CM

## 2022-06-07 DIAGNOSIS — M7121 Synovial cyst of popliteal space [Baker], right knee: Secondary | ICD-10-CM

## 2022-06-07 DIAGNOSIS — M25561 Pain in right knee: Secondary | ICD-10-CM

## 2022-06-07 MED ORDER — MELOXICAM 15 MG PO TABS
15.0000 mg | ORAL_TABLET | Freq: Every day | ORAL | 0 refills | Status: DC
  Filled 2022-06-07: qty 30, 30d supply, fill #0

## 2022-06-07 NOTE — Progress Notes (Signed)
Tyler [name] Tyler [name] [facility] Phone: [phone]   Assessment and Plan:     1. Acute pain of right knee 2. Baker cyst, right 3. Right leg swelling -Acute, subsequent sports medicine visit - I suspect the patient has had a rupture of right Baker's cyst which is led to generalized swelling and ecchymosis and right lower extremity distal to knee.  This would be consistent with HPI which includes large Baker's cyst found during ER lower extremity Doppler, then description of decreased posterior fossa swelling with new ecchymosis occurring following ER visit - Reassuring that patient had negative DVT Doppler ultrasound at ER visit on 06/05/2022, denies shortness of breath or chest pain - Overall knee pain is significantly improved since intra-articular CSI performed on 06/02/2022 - For generalized swelling and pain we will start meloxicam 15 mg daily for 2 weeks and then discontinue medication.  Also recommend RICE therapy - Start HEP for knee  Other orders - meloxicam (MOBIC) 15 MG tablet; Take 1 tablet (15 mg total) by mouth daily.      Pertinent previous records reviewed include ER visit note 06/05/2022, ER triage note 06/05/2022, ED tech note 06/05/2022, venous ultrasound right lower extremity study 06/05/2022, telephone encounter 06/06/2022   Follow Up: As needed if no improvement or worsening of symptoms.  Could consider ultrasound to see if Baker's cyst is still present.  No significant posterior fossa swelling on physical exam today, so no ultrasound performed.   Subjective:   I, Tyler [name], am serving as a Education administrator for Doctor Glennon Mac  Chief Complaint: right knee pain   HPI:  06/02/2022 Tyler [name] is a 46 y.o. male who presents to [place] at [facility] today for R knee pain.  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  Patient was previously seen by Dr. [name] on 04/18/2022  for his left knee.  Today, patient reports R knee pain ongoing for about 1 week.  Patient locates pain to the anterior, posterior aspect of the R knee and into the R calf.    R Knee swelling: yes- has improved some Mechanical symptoms: yes Aggravates: walking Treatments tried: motrin, Tylenol   Pertinent review of systems: No fevers or chills   Relevant historical information: heart failure  06/07/2022 Patient states that he is still swollen and bruising below the knee , the knee feels good otherwise    Relevant Historical Information: Hypertension, CHF, DM type II  Additional pertinent review of systems negative.   Current Outpatient Medications:    albuterol (VENTOLIN HFA) 108 (90 Base) MCG/ACT inhaler, Inhale 2 puffs into the lungs every 6 (six) hours as needed for wheezing., Disp: 6.7 g, Rfl: 11   amLODipine (NORVASC) 10 MG tablet, TAKE 1 TABLET BY MOUTH ONCE DAILY . APPOINTMENT REQUIRED FOR FUTURE REFILLS, Disp: 90 tablet, Rfl: 3   aspirin EC 81 MG tablet, Take 81 mg by mouth daily., Disp: , Rfl:    atorvastatin (LIPITOR) 80 MG tablet, Take 1 tablet (80 mg total) by mouth daily., Disp: 90 tablet, Rfl: 2   Blood Glucose Monitoring Suppl (FREESTYLE LITE) DEVI, 1 Device by Does not apply route daily., Disp: 100 each, Rfl: 3   Blood Glucose Monitoring Suppl (FREESTYLE LITE) w/Device KIT, Use daily as directed to test blood glucose., Disp: 1 kit, Rfl: 3   budesonide-formoterol (SYMBICORT) 160-4.5 MCG/ACT inhaler, INHALE 2 PUFFS BY MOUTH TWICE A DAY, Disp: 10.2 g, Rfl:  12   carvedilol (COREG) 25 MG tablet, Take 1 tablet (25 mg total) by mouth 2 (two) times daily., Disp: 180 tablet, Rfl: 3   cetirizine (ZYRTEC) 10 MG tablet, Take 10 mg by mouth daily., Disp: , Rfl:    Cholecalciferol (VITAMIN D) 50 MCG (2000 UT) tablet, Take 2,000 Units by mouth daily., Disp: , Rfl:    dapagliflozin propanediol (FARXIGA) 5 MG TABS tablet, Take 1 tablet (5 mg total) by mouth daily before breakfast., Disp:  90 tablet, Rfl: 3   ezetimibe (ZETIA) 10 MG tablet, Take 1 tablet by mouth once daily, Disp: 90 tablet, Rfl: 3   furosemide (LASIX) 20 MG tablet, Take 1 tablet by mouth once daily, Disp: 90 tablet, Rfl: 2   glucose blood (FREESTYLE LITE) test strip, Use 1 strip to check blood glucose once daily as directed., Disp: 100 each, Rfl: 12   glucose blood (FREESTYLE TEST STRIPS) test strip, 1 each by Other route daily. And lancets 1/day, Disp: 100 each, Rfl: 12   Lancets (FREESTYLE) lancets, Use 1 lancet once daily as directed to test blood glucose., Disp: 100 each, Rfl: 12   Lancets MISC, Use as directed twice per day E11.9, Disp: 200 each, Rfl: 12   meloxicam (MOBIC) 15 MG tablet, Take 1 tablet (15 mg total) by mouth daily., Disp: 30 tablet, Rfl: 0   Multiple Vitamins-Minerals (MULTIVITAMIN WITH MINERALS) tablet, Take 1 tablet by mouth daily., Disp: , Rfl:    nitroGLYCERIN (NITROSTAT) 0.4 MG SL tablet, Place 1 tablet (0.4 mg total) under the tongue every 5 (five) minutes as needed for chest pain., Disp: 25 tablet, Rfl: 3   PEG-KCl-NaCl-NaSulf-Na Asc-C (PLENVU) 140 g SOLR, Take 1 kit by mouth as directed., Disp: 3 each, Rfl: 0   PRESCRIPTION MEDICATION, Inhale into the lungs at bedtime. CPAP, Disp: , Rfl:    sacubitril-valsartan (ENTRESTO) 49-51 MG, Take 1 tablet by mouth 2 (two) times daily, Disp: 60 tablet, Rfl: 3   Semaglutide (RYBELSUS) 14 MG TABS, Take 1 tablet by mouth once daily, Disp: 90 tablet, Rfl: 3   spironolactone (ALDACTONE) 25 MG tablet, Take 1 tablet by mouth once daily, Disp: 90 tablet, Rfl: 3   Objective:     Vitals:   06/07/22 1609  Pulse: 76  SpO2: 98%  Weight: (!) 370 lb (167.8 kg)  Height: _0  (1.753 m)      Body mass index is 54.64 kg/m.    Physical Exam:    General:  awake, alert oriented, no acute distress nontoxic Skin: no suspicious lesions or rashes Neuro:sensation intact and strength 5/5 with no deficits, no atrophy, normal muscle tone Psych: No signs of  anxiety, depression or other mood disorder  Right knee: Mild intra-articular swelling, though difficult to assess due to body habitus Generalized lower extremity edema with 1+ pitting on right that was not present on left.  Ecchymosis present over medial gastroc distal to knee No deformity ROM Flex 100, Ext 5 Generalized TTP through posterior fossa without swelling, gastroc NTTP over the quad tendon, medial fem condyle, lat fem condyle, patella, plica, patella tendon, tibial tuberostiy, fibular head,   pes anserine bursa, gerdy's tubercle, medial jt line, lateral jt line Neg anterior and posterior drawer   Gait normal    Electronically signed by:  Tyler [name] D.[facility] 4:28 PM 06/07/22

## 2022-06-07 NOTE — Patient Instructions (Addendum)
Good to see you  - Start meloxicam 15 mg daily x2 weeks.  If still having pain after 2 weeks, complete 3rd-week of meloxicam. May use remaining meloxicam as needed once daily for pain control.  Do not to use additional NSAIDs while taking meloxicam.  May use Tylenol [phone] mg 2 to 3 times a day for breakthrough pain. Knee HEP  as needed follow up

## 2022-06-09 DIAGNOSIS — E119 Type 2 diabetes mellitus without complications: Secondary | ICD-10-CM | POA: Diagnosis not present

## 2022-06-10 DIAGNOSIS — M7989 Other specified soft tissue disorders: Secondary | ICD-10-CM | POA: Diagnosis not present

## 2022-06-10 NOTE — Progress Notes (Signed)
Right knee x-ray shows arthritis changes.  There is also a big bone spur from the kneecap.

## 2022-06-11 ENCOUNTER — Emergency Department (HOSPITAL_BASED_OUTPATIENT_CLINIC_OR_DEPARTMENT_OTHER)
Admit: 2022-06-11 | Discharge: 2022-06-11 | Disposition: A | Discharge status: Home / Self Care | Payer: Federal, State, Local not specified - PPO | Attending: Emergency Medicine | Admitting: Emergency Medicine

## 2022-06-11 ENCOUNTER — Emergency Department (HOSPITAL_COMMUNITY)
Admission: EM | Admit: 2022-06-11 | Discharge: 2022-06-11 | Disposition: A | Discharge status: Home / Self Care | Payer: Federal, State, Local not specified - PPO | Attending: Emergency Medicine | Admitting: Emergency Medicine

## 2022-06-11 ENCOUNTER — Other Ambulatory Visit: Payer: Self-pay

## 2022-06-11 DIAGNOSIS — R2240 Localized swelling, mass and lump, unspecified lower limb: Secondary | ICD-10-CM | POA: Diagnosis not present

## 2022-06-11 DIAGNOSIS — Z7982 Long term (current) use of aspirin: Secondary | ICD-10-CM | POA: Diagnosis not present

## 2022-06-11 DIAGNOSIS — M7989 Other specified soft tissue disorders: Secondary | ICD-10-CM | POA: Diagnosis not present

## 2022-06-11 DIAGNOSIS — R2241 Localized swelling, mass and lump, right lower limb: Secondary | ICD-10-CM | POA: Diagnosis not present

## 2022-06-11 LAB — CBC WITH DIFFERENTIAL/PLATELET
Abs Immature Granulocytes: 0.04 10*3/uL (ref 0.00–0.07)
Basophils Absolute: 0 10*3/uL (ref 0.0–0.1)
Basophils Relative: 0 %
Eosinophils Absolute: 0.3 10*3/uL (ref 0.0–0.5)
Eosinophils Relative: 4 %
HCT: 38.7 % — ABNORMAL LOW (ref 39.0–52.0)
Hemoglobin: 12.4 g/dL — ABNORMAL LOW (ref 13.0–17.0)
Immature Granulocytes: 0 %
Lymphocytes Relative: 20 %
Lymphs Abs: 1.8 10*3/uL (ref 0.7–4.0)
MCH: 28.7 pg (ref 26.0–34.0)
MCHC: 32 g/dL (ref 30.0–36.0)
MCV: 89.6 fL (ref 80.0–100.0)
Monocytes Absolute: 0.9 10*3/uL (ref 0.1–1.0)
Monocytes Relative: 10 %
Neutro Abs: 6 10*3/uL (ref 1.7–7.7)
Neutrophils Relative %: 66 %
Platelets: 242 10*3/uL (ref 150–400)
RBC: 4.32 MIL/uL (ref 4.22–5.81)
RDW: 15.1 % (ref 11.5–15.5)
WBC: 9.1 10*3/uL (ref 4.0–10.5)
nRBC: 0 % (ref 0.0–0.2)

## 2022-06-11 LAB — BASIC METABOLIC PANEL
Anion gap: 6 (ref 5–15)
BUN: 20 mg/dL (ref 6–20)
CO2: 26 mmol/L (ref 22–32)
Calcium: 8.7 mg/dL — ABNORMAL LOW (ref 8.9–10.3)
Chloride: 109 mmol/L (ref 98–111)
Creatinine, Ser: 0.99 mg/dL (ref 0.61–1.24)
GFR, Estimated: >60 mL/min (ref >60)
Glucose, Bld: 96 mg/dL (ref 70–99)
Potassium: 4.3 mmol/L (ref 3.5–5.1)
Sodium: 141 mmol/L (ref 135–145)

## 2022-06-11 LAB — PROTIME-INR
INR: 1.1 (ref 0.8–1.2)
Prothrombin Time: 13.7 seconds (ref 11.4–15.2)

## 2022-06-11 MED ORDER — OXYCODONE-ACETAMINOPHEN 5-325 MG PO TABS: 1.0000 | ORAL_TABLET | Freq: Four times a day (QID) | ORAL | 0 refills | Status: DC | PRN

## 2022-06-11 MED ORDER — CEPHALEXIN 500 MG PO CAPS: 500.0000 mg | ORAL_CAPSULE | Freq: Four times a day (QID) | ORAL | 0 refills | Status: DC

## 2022-06-11 NOTE — ED Triage Notes (Signed)
Patient coming to ED for evaluation of R leg swelling and bruising to lower leg.  Reports he had a Cortizone shot to knee for pain.  Swelling developed behind knee and then into lower leg.   R leg appears to be twice the size of L leg currently.  Pt had recent doppler completed with negative findings.  Pain with ambulation

## 2022-06-11 NOTE — Progress Notes (Signed)
Right lower extremity venous duplex has been completed. Preliminary results can be found in CV Proc through chart review.  Results were given to Dr. Charm Barges.  06/11/22 11:24 AM Olen Cordial RVT

## 2022-06-11 NOTE — ED Provider Notes (Signed)
[place] DEPT Provider Note   CSN: [id number] Arrival date & time: 06/11/22  0510     History  Chief Complaint  Patient presents with   Leg Swelling    Tyler [name] is a 46 y.o. male.  He is here with a complaint of swollen and bruised right leg.  He said about a week ago he had a cortisone injection in his knee.  He was told he also had a Baker's cyst there but it did not need any intervention.  Few days later he started getting some swelling in his lower leg.  He had a ultrasound done about 6 days ago that did not show any evidence of blood clot.  He has gone to urgent care and they told him he may have an infection and needed to come back to the ER.  He has had bruising and swelling to the lower leg.  He says his knee feels fine now.  He did have problems with bleeding after a dental extraction but otherwise does not have any easy bleeding or bruising.  He denies alcohol use he is not on blood thinners other than aspirin.  No nausea vomiting fevers chills chest pain shortness of breath.  The history is provided by the patient.  Leg Pain Location:  Leg Time since incident:  1 week Injury: no   Leg location:  R lower leg Pain details:    Quality:  Aching   Severity:  Moderate   Onset quality:  Gradual   Timing:  Constant   Progression:  Worsening Chronicity:  New Worsened by:  Bearing weight and exercise Ineffective treatments:  Rest and elevation Associated symptoms: swelling   Associated symptoms: no fever        Home Medications Prior to Admission medications   Medication Sig Start Date End Date Taking? Authorizing Provider  albuterol (VENTOLIN HFA) 108 (90 Base) MCG/ACT inhaler Inhale 2 puffs into the lungs every 6 (six) hours as needed for wheezing. 01/24/22   Baird Lyons D, MD  amLODipine (NORVASC) 10 MG tablet TAKE 1 TABLET BY MOUTH ONCE DAILY . APPOINTMENT REQUIRED FOR FUTURE REFILLS 02/24/22   Lelon Perla, MD  aspirin  EC 81 MG tablet Take 81 mg by mouth daily.    [provider]  atorvastatin (LIPITOR) 80 MG tablet Take 1 tablet (80 mg total) by mouth daily. 04/20/22   Lelon Perla, MD  Blood Glucose Monitoring Suppl (FREESTYLE LITE) DEVI 1 Device by Does not apply route daily. 11/06/20   Renato Shin, MD  Blood Glucose Monitoring Suppl (FREESTYLE LITE) w/Device KIT Use daily as directed to test blood glucose. 11/06/20     budesonide-formoterol (SYMBICORT) 160-4.5 MCG/ACT inhaler INHALE 2 PUFFS BY MOUTH TWICE A DAY 01/24/22 01/24/23  Baird Lyons D, MD  carvedilol (COREG) 25 MG tablet Take 1 tablet (25 mg total) by mouth 2 (two) times daily. 11/05/21   Lelon Perla, MD  cetirizine (ZYRTEC) 10 MG tablet Take 10 mg by mouth daily.    [provider]  Cholecalciferol (VITAMIN D) 50 MCG (2000 UT) tablet Take 2,000 Units by mouth daily. 06/19/20   [provider]  dapagliflozin propanediol (FARXIGA) 5 MG TABS tablet Take 1 tablet (5 mg total) by mouth daily before breakfast. 07/19/21   Renato Shin, MD  ezetimibe (ZETIA) 10 MG tablet Take 1 tablet by mouth once daily 02/07/22   Lelon Perla, MD  furosemide (LASIX) 20 MG tablet Take 1 tablet  by mouth once daily 03/29/22   Lelon Perla, MD  glucose blood (FREESTYLE LITE) test strip Use 1 strip to check blood glucose once daily as directed. 11/06/20     glucose blood (FREESTYLE TEST STRIPS) test strip 1 each by Other route daily. And lancets 1/day 11/06/20   Renato Shin, MD  Lancets (FREESTYLE) lancets Use 1 lancet once daily as directed to test blood glucose. 11/06/20     Lancets MISC Use as directed twice per day E11.9 09/23/20   Biagio Borg, MD  meloxicam (MOBIC) 15 MG tablet Take 1 tablet (15 mg total) by mouth daily. 06/07/22   Glennon Mac, DO  Multiple Vitamins-Minerals (MULTIVITAMIN WITH MINERALS) tablet Take 1 tablet by mouth daily.    [provider]  nitroGLYCERIN (NITROSTAT) 0.4 MG SL tablet Place 1 tablet  (0.4 mg total) under the tongue every 5 (five) minutes as needed for chest pain. 12/10/14   Burtis Junes, NP  PEG-KCl-NaCl-NaSulf-Na Asc-C (PLENVU) 140 g SOLR Take 1 kit by mouth as directed. 10/12/20   Milus Banister, MD  PRESCRIPTION MEDICATION Inhale into the lungs at bedtime. CPAP    [provider]  sacubitril-valsartan (ENTRESTO) 49-51 MG Take 1 tablet by mouth 2 (two) times daily 06/02/22   Lelon Perla, MD  Semaglutide (RYBELSUS) 14 MG TABS Take 1 tablet by mouth once daily 02/07/22   Philemon Kingdom, MD  spironolactone (ALDACTONE) 25 MG tablet Take 1 tablet by mouth once daily 02/24/22   Lelon Perla, MD      Allergies    Patient has no known allergies.    Review of Systems   Review of Systems  Constitutional:  Negative for fever.  Respiratory:  Negative for shortness of breath.   Cardiovascular:  Positive for leg swelling. Negative for chest pain.  Skin:  Positive for color change.  Neurological:  Negative for weakness and numbness.    Physical Exam Updated Vital Signs BP (!) 141/93 (BP Location: Left Arm)   Pulse 71   Temp (!) 97.5 F (36.4 C) (Oral)   Resp 18   Ht 5' 9" (1.753 m)   Wt (!) 167.8 kg   SpO2 96%   BMI 54.64 kg/m  Physical Exam Vitals and nursing note reviewed.  Constitutional:      General: He is not in acute distress.    Appearance: Normal appearance. He is well-developed.  HENT:     Head: Normocephalic and atraumatic.  Eyes:     Conjunctiva/sclera: Conjunctivae normal.  Cardiovascular:     Rate and Rhythm: Normal rate and regular rhythm.     Heart sounds: No murmur heard. Pulmonary:     Effort: Pulmonary effort is normal. No respiratory distress.     Breath sounds: Normal breath sounds.  Abdominal:     Palpations: Abdomen is soft.     Tenderness: There is no abdominal tenderness.  Musculoskeletal:        General: Swelling and tenderness present.     Cervical back: Neck supple.     Right lower leg: Edema present.      Comments: He has swelling and bruising of his right lower leg.  Distal neurovascular intact.  No cords appreciated.  Skin:    General: Skin is warm and dry.     Capillary Refill: Capillary refill takes less than 2 seconds.  Neurological:     General: No focal deficit present.     Mental Status: He is alert.  ED Results / Procedures / Treatments   Labs (all labs ordered are listed, but only abnormal results are displayed) Labs Reviewed  BASIC METABOLIC PANEL - Abnormal; Notable for the following components:      Result Value   Calcium 8.7 (*)    All other components within normal limits  CBC WITH DIFFERENTIAL/PLATELET - Abnormal; Notable for the following components:   Hemoglobin 12.4 (*)    HCT 38.7 (*)    All other components within normal limits  PROTIME-INR    EKG None  Radiology VAS [place] LOWER EXTREMITY VENOUS (DVT) (7a-7p)  Result Date: 06/11/2022  Lower Venous DVT Study Patient Name:  Tyler [name]  Date of Exam:   06/11/2022 Medical Rec #: [id number]           Accession #:    [id number] Date of Birth: 02-03-1976           Patient Gender: M Patient Age:   1 years Exam Location:  [facility] Procedure:      VAS [place] LOWER EXTREMITY VENOUS (DVT) Referring Phys: Legrand Como BUTLER --------------------------------------------------------------------------------  Indications: Swelling.  Limitations: Body habitus and poor ultrasound/tissue interface. Comparison Study: 06/05/2022 - 1. No deep vein thrombosis in the right lower                   extremity.                   2. Large right Baker's cyst. Performing Technologist: Oliver Hum RVT  Examination Guidelines: A complete evaluation includes B-mode imaging, spectral Doppler, color Doppler, and power Doppler as needed of all accessible portions of each vessel. Bilateral testing is considered an integral part of a complete examination. Limited examinations for reoccurring indications may be performed as noted. The  reflux portion of the exam is performed with the patient in reverse Trendelenburg.  +---------+---------------+---------+-----------+----------+--------------+ RIGHT    CompressibilityPhasicitySpontaneityPropertiesThrombus Aging +---------+---------------+---------+-----------+----------+--------------+ CFV      Full           Yes      Yes                                 +---------+---------------+---------+-----------+----------+--------------+ SFJ      Full                                                        +---------+---------------+---------+-----------+----------+--------------+ FV Prox  Full                                                        +---------+---------------+---------+-----------+----------+--------------+ FV Mid   Full                                                        +---------+---------------+---------+-----------+----------+--------------+ FV DistalFull                                                        +---------+---------------+---------+-----------+----------+--------------+  PFV      Full                                                        +---------+---------------+---------+-----------+----------+--------------+ POP      Full           Yes      Yes                                 +---------+---------------+---------+-----------+----------+--------------+ PTV      Full                                                        +---------+---------------+---------+-----------+----------+--------------+ PERO     Full                                                        +---------+---------------+---------+-----------+----------+--------------+   +----+---------------+---------+-----------+----------+--------------+ LEFTCompressibilityPhasicitySpontaneityPropertiesThrombus Aging +----+---------------+---------+-----------+----------+--------------+ CFV Full           Yes      Yes                                  +----+---------------+---------+-----------+----------+--------------+     Summary: RIGHT: - There is no evidence of deep vein thrombosis in the lower extremity.  - A cystic structure is found in the popliteal fossa. There is an area of mixed echogenicity noted to begin in the popliteal fossa extending into the proximal, medial calf.  LEFT: - No evidence of common femoral vein obstruction.  *See table(s) above for measurements and observations. Electronically signed by Deitra Mayo MD on 06/11/2022 at 2:06:23 PM.    Final     Procedures Procedures    Medications Ordered in ED Medications - No data to display  ED Course/ Medical Decision Making/ A&P                           Medical Decision Making Amount and/or Complexity of Data Reviewed Labs: ordered.  Risk Prescription drug management.   This patient complains of right leg swelling pain and bruising; this involves an extensive number of treatment Options and is a complaint that carries with it a high risk of complications and morbidity. The differential includes DVT, cellulitis, hematoma, compartment syndrome, arterial embolism  I ordered, reviewed and interpreted labs, which included CBC with normal white count, hemoglobin low stable from priors, chemistries unremarkable, INR normal I ordered imaging studies which included venous duplex and I independently    visualized and interpreted imaging which showed no acute DVT, does have likely Baker's cyst Previous records obtained and reviewed in epic including recent duplex Cardiac monitoring reviewed, normal sinus rhythm Social determinants considered, no significant barriers Critical Interventions: None  After the interventions stated above, I reevaluated the patient and found patient awake alert no distress Admission and further testing considered, no indications  for admission at this time.  Will have patient treat symptomatically with warm compress and  elevation, will cover with antibiotics and pain medication for possible infection.  Recommend close follow-up with PCP and orthopedic team.  Return instructions discussed         Final Clinical Impression(s) / ED Diagnoses Final diagnoses:  Right leg swelling    Rx / DC Orders ED Discharge Orders          Ordered    oxyCODONE-acetaminophen (PERCOCET/ROXICET) 5-325 MG tablet  Every 6 hours PRN        06/11/22 1319    cephALEXin (KEFLEX) 500 MG capsule  4 times daily        06/11/22 1319              Hayden Rasmussen, MD 06/11/22 1816

## 2022-06-11 NOTE — Discharge Instructions (Signed)
You are seen in the emergency department for continued right leg swelling and bruising.  You had an ultrasound that did not show any evidence of a blood clot.  You do have a cyst behind your knee called a Baker's cyst and this may be the cause of your problems.  We are starting you on some pain medication to use as needed and some antibiotics for possible signs of infection.  Please elevate and you can use warm compress to the leg.  Compression stockings may help.  Follow-up with your primary care doctor and your orthopedic team.  Return if any worsening or concerning symptoms

## 2022-06-16 DIAGNOSIS — M25561 Pain in right knee: Secondary | ICD-10-CM | POA: Insufficient documentation

## 2022-06-23 ENCOUNTER — Encounter: Payer: Self-pay | Admitting: Internal Medicine

## 2022-06-23 DIAGNOSIS — E1159 Type 2 diabetes mellitus with other circulatory complications: Secondary | ICD-10-CM

## 2022-06-23 MED ORDER — METFORMIN HCL ER 500 MG PO TB24: 1000.0000 mg | ORAL_TABLET | Freq: Two times a day (BID) | ORAL | 3 refills | Status: DC

## 2022-06-30 ENCOUNTER — Ambulatory Visit: Payer: Federal, State, Local not specified - PPO | Admitting: Family Medicine

## 2022-06-30 ENCOUNTER — Ambulatory Visit: Payer: Self-pay

## 2022-06-30 VITALS — BP 120/78 | HR 73 | Ht 69.0 in | Wt 381.0 lb

## 2022-06-30 DIAGNOSIS — M7989 Other specified soft tissue disorders: Secondary | ICD-10-CM

## 2022-06-30 DIAGNOSIS — S8011XA Contusion of right lower leg, initial encounter: Secondary | ICD-10-CM

## 2022-06-30 DIAGNOSIS — T148XXA Other injury of unspecified body region, initial encounter: Secondary | ICD-10-CM

## 2022-06-30 MED ORDER — AMBULATORY NON FORMULARY MEDICATION: 1.0000 [IU] | Freq: Once | 0 refills | Status: AC

## 2022-06-30 NOTE — Progress Notes (Signed)
Rito Ehrlich, am serving as a Education administrator for Dr. [name].  Tyler [name] is a 47 y.o. male who presents to [place] at [facility] today for continued right lower leg swelling.  Patient was last seen by Dr. Glennon Mac on 06/07/2022 who advised RICE therapy and was prescribed meloxicam.  Patient was later seen at [facility] ED on 06/11/2022 who prescribed Keflex and oxycodone. Today, pt reports that the swelling int the right knee is back and now the left knee has started to swell up again. Patient finished the antibiotic and is not taking the oxycodone. Patient started back up the Meloxicam two days ago. When driving is when the knees are the worst. Patient would like an Rx for compression stockings   Dx testing: 06/11/22 R LE vasc US 06/05/22 R LE vasc US  06/02/22 R shoulder XR  Pertinent review of systems: No fevers or chills  Relevant historical information: Heart failure Diabetes  Exam:  BP 120/78   Pulse 73   Ht 5\' 9"  (1.753 m)   Wt (!) 381 lb (172.8 kg)   SpO2 98%   BMI 56.26 kg/m  General: Well Developed, well nourished, and in no acute distress.   MSK: Right calf significant swelling and bruising right medial calf.  Mildly tender palpation.  Area is soft swelling medial gastrocnemius. Normal foot and ankle motion.  Some pain present with resisted foot plantarflexion.   Lab and Radiology Results  Procedure: Real-time Ultrasound Guided aspiration of hematoma or seroma right medial gastrocnemius Device: Philips Affiniti 50G Images permanently stored and available for review in PACS Prior to aspiration attempts diagnostic ultrasound showed a large hypoechoic structure superficial to the medial gastrocnemius muscle that appears consistent with a hematoma from a medial gastrocnemius muscle tear Verbal informed consent obtained.  Discussed risks and benefits of procedure. Warned about infection, bleeding, hyperglycemia damage to structures among  others. Patient expresses understanding and agreement Time-out conducted.   Noted no overlying erythema, induration, or other signs of local infection.   Skin prepped in a sterile fashion.   Local anesthesia: Topical Ethyl chloride.   With sterile technique and under real time ultrasound guidance: 3 mL of lidocaine injected into subcutaneous tissue along planned aspiration tract. Fluid seen entering the subcutaneous tissue.. Skin was again sterilized with isopropyl alcohol and an 18-gauge spinal needle was used to access the hematoma/seroma. A small amount of dark blood was aspirated but despite the tip of the needle confirmed to be within the fluid mass seen on ultrasound no further fluid was aspirated. Advised to call if fevers/chills, erythema, induration, drainage, or persistent bleeding.   Images permanently stored and available for review in the ultrasound unit.  Impression: Unsuccessful aspiration of hematoma        Assessment and Plan: 47 y.o. male with right calf hematoma.  I think the source of his pain and bruising in the calf that prompted an emergency room visit on December 23 with a calf muscle tear and bleeding to the calf what was seen on ultrasound is a hematoma.  It looks like it was ready for drainage on ultrasound today but unfortunately I think the clot is just still too coagulated to be able to be aspirated.  Plan to treat with compression and reassess in a month.  If it still present which I expect it would probably be we could attempt it again.  Maturity to a seroma will typically allow for better aspiration.  I have  written for compression stockings.  His large calf size may necessitate that he is for  prescription compression stockings.   PDMP not reviewed this encounter. Orders Placed This Encounter  Procedures   [place] LIMITED JOINT SPACE STRUCTURES LOW RIGHT(NO LINKED CHARGES)    Order Specific Question:   Reason for Exam (SYMPTOM  OR DIAGNOSIS REQUIRED)     Answer:   right knee pain    Order Specific Question:   Preferred imaging location?    Answer:   [place]   Meds ordered this encounter  Medications   AMBULATORY NON FORMULARY MEDICATION    Sig: 1 Units by Does not apply route once for 1 dose. Medication Name: Compression stockings    Dispense:  1 Units    Refill:  0     Discussed warning signs or symptoms. Please see discharge instructions. Patient expresses understanding.   The above documentation has been reviewed and is accurate and complete [name], M.D.

## 2022-06-30 NOTE — Patient Instructions (Signed)
Good to see you  Tried draining today Prescription written for compression stockings See me again in a month

## 2022-07-07 ENCOUNTER — Other Ambulatory Visit (HOSPITAL_COMMUNITY): Payer: Self-pay

## 2022-07-07 ENCOUNTER — Ambulatory Visit
Admission: EM | Admit: 2022-07-07 | Discharge: 2022-07-07 | Disposition: A | Discharge status: Home / Self Care | Payer: Federal, State, Local not specified - PPO | Attending: Nurse Practitioner | Admitting: Nurse Practitioner

## 2022-07-07 DIAGNOSIS — J069 Acute upper respiratory infection, unspecified: Secondary | ICD-10-CM | POA: Diagnosis not present

## 2022-07-07 DIAGNOSIS — R051 Acute cough: Secondary | ICD-10-CM | POA: Diagnosis not present

## 2022-07-07 DIAGNOSIS — Z1152 Encounter for screening for COVID-19: Secondary | ICD-10-CM | POA: Diagnosis not present

## 2022-07-07 MED ORDER — PROMETHAZINE-DM 6.25-15 MG/5ML PO SYRP
5.0000 mL | ORAL_SOLUTION | Freq: Every evening | ORAL | 0 refills | Status: DC | PRN
  Filled 2022-07-07: qty 118, 23d supply, fill #0

## 2022-07-07 MED ORDER — BENZONATATE 200 MG PO CAPS
200.0000 mg | ORAL_CAPSULE | Freq: Three times a day (TID) | ORAL | 0 refills | Status: DC | PRN
  Filled 2022-07-07: qty 20, 7d supply, fill #0

## 2022-07-07 NOTE — Discharge Instructions (Signed)
Tessalon as needed for cough during the day as this is nondrowsy Promethazine DM at night for cough.  Please note this medication can make you drowsy.  Do not drink alcohol or drive while on this medication The clinic will contact you with results of your COVID testing if positive Rest and fluids Follow-up with your PCP 2 to 3 days for recheck Please go to emergency room if you have any worsening symptoms

## 2022-07-07 NOTE — ED Provider Notes (Signed)
[facility]    CSN: [id number] Arrival date & time: 07/07/22  1428      History   Chief Complaint Chief Complaint  Patient presents with   Cough   Nasal Congestion    HPI Jden Want is a 47 y.o. male who presents for evaluation of URI symptoms for 1 days. Patient reports associated symptoms of cough and congestion. Denies N/V/D, sore throat, fevers, body aches, ear pain, shortness of breath. Patient does not have a hx of asthma or smoking. No known sick contacts and no recent travel. Pt is vaccinated for COVID. Pt is vaccinated for flu this season. Pt has taken pain OTC for symptoms. Pt has no other concerns at this time.    Cough   Past Medical History:  Diagnosis Date   ACHILLES TENDINITIS, BILATERAL 10/16/2008   ALLERGIC RHINITIS 08/13/2009   CARDIOMYOPATHY 04/21/2009   CHF (congestive heart failure) (HCC)    CONGESTIVE HEART FAILURE 12/04/2007   CORONARY ARTERY DISEASE 12/04/2007   GERD (gastroesophageal reflux disease) 11/01/2011   HYPERLIPIDEMIA 03/12/2007   HYPERTENSION 03/12/2007   OBESITY 04/21/2009   Plantar fasciitis, bilateral 11/01/2011   SLEEP APNEA 03/12/2007    Patient Active Problem List   Diagnosis Date Noted   CAP (community acquired pneumonia) 01/24/2022   Leg pain, left 01/05/2021   Vitamin D deficiency 08/05/2020   COVID-19 virus infection 06/23/2020   Leg mass, left 08/28/2018   Left elbow pain 06/22/2018   Pain of left heel 12/15/2017   Vertigo 07/18/2017   Left ear hearing loss 07/18/2017   Acute right-sided low back pain without sciatica 10/01/2016   Abnormal myocardial perfusion study 12/19/2014   Mild intermittent acute asthmatic bronchitis 11/14/2014   Wheezing 09/03/2014   Dyspnea 03/19/2013   Nausea alone 11/27/2012   Plantar fasciitis, bilateral 11/01/2011   Poorly controlled type 2 diabetes mellitus with circulatory disorder (HCC) 11/01/2011   Cutaneous skin tags 11/01/2011   GERD (gastroesophageal reflux disease)  11/01/2011   Encounter for well adult exam with abnormal findings 04/28/2011   Ingrown nail 04/28/2011   ALLERGIC RHINITIS 08/13/2009   Morbid obesity (HCC) 04/21/2009   CARDIOMYOPATHY 04/21/2009   ACHILLES TENDINITIS, BILATERAL 10/16/2008   Coronary atherosclerosis 12/04/2007   Congestive heart failure (HCC) 12/04/2007   Dyslipidemia 03/12/2007   Essential hypertension 03/12/2007   OSA (obstructive sleep apnea) 03/12/2007    Past Surgical History:  Procedure Laterality Date   CARDIAC CATHETERIZATION N/A 12/19/2014   Procedure: Left Heart Cath and Coronary Angiography;  Surgeon: Lyn Records, MD;  [facility]VAS[facility] CV [facility];  Service: Cardiovascular;  Laterality: N/A;   COLONOSCOPY WITH PROPOFOL N/A 11/19/2020   Procedure: COLONOSCOPY WITH PROPOFOL;  Surgeon: Rachael Fee, MD;  Location: [facility] ENDOSCOPY;  Service: Endoscopy;  Laterality: N/A;   NO PAST SURGERIES         Home Medications    Prior to Admission medications   Medication Sig Start Date End Date Taking? Authorizing Provider  benzonatate (TESSALON) 200 MG capsule Take 1 capsule (200 mg total) by mouth 3 (three) times daily as needed for cough. 07/07/22  Yes Radford Pax, NP  promethazine-dextromethorphan (PROMETHAZINE-DM) 6.25-15 MG/5ML syrup Take 5 mLs by mouth at bedtime as needed for cough. 07/07/22  Yes Radford Pax, NP  albuterol (VENTOLIN HFA) 108 (90 Base) MCG/ACT inhaler Inhale 2 puffs into the lungs every 6 (six) hours as needed for wheezing. 01/24/22   Jetty Duhamel D, MD  amLODipine (NORVASC) 10 MG tablet  TAKE 1 TABLET BY MOUTH ONCE DAILY . APPOINTMENT REQUIRED FOR FUTURE REFILLS 02/24/22   Lewayne Bunting, MD  aspirin EC 81 MG tablet Take 81 mg by mouth daily.    [provider]  atorvastatin (LIPITOR) 80 MG tablet Take 1 tablet (80 mg total) by mouth daily. 04/20/22   Lewayne Bunting, MD  Blood Glucose Monitoring Suppl (FREESTYLE LITE) DEVI 1 Device by Does not apply route daily. 11/06/20   Romero Belling, MD  Blood Glucose Monitoring Suppl (FREESTYLE LITE) w/Device KIT Use daily as directed to test blood glucose. 11/06/20     budesonide-formoterol (SYMBICORT) 160-4.5 MCG/ACT inhaler INHALE 2 PUFFS BY MOUTH TWICE A DAY 01/24/22 01/24/23  Jetty Duhamel D, MD  carvedilol (COREG) 25 MG tablet Take 1 tablet (25 mg total) by mouth 2 (two) times daily. 11/05/21   Lewayne Bunting, MD  cephALEXin (KEFLEX) 500 MG capsule Take 1 capsule (500 mg total) by mouth 4 (four) times daily. 06/11/22   Terrilee Files, MD  cetirizine (ZYRTEC) 10 MG tablet Take 10 mg by mouth daily.    [provider]  Cholecalciferol (VITAMIN D) 50 MCG (2000 UT) tablet Take 2,000 Units by mouth daily. 06/19/20   [provider]  dapagliflozin propanediol (FARXIGA) 5 MG TABS tablet Take 1 tablet (5 mg total) by mouth daily before breakfast. 07/19/21   Romero Belling, MD  ezetimibe (ZETIA) 10 MG tablet Take 1 tablet by mouth once daily 02/07/22   Lewayne Bunting, MD  furosemide (LASIX) 20 MG tablet Take 1 tablet by mouth once daily 03/29/22   Lewayne Bunting, MD  glucose blood (FREESTYLE LITE) test strip Use 1 strip to check blood glucose once daily as directed. 11/06/20     glucose blood (FREESTYLE TEST STRIPS) test strip 1 each by Other route daily. And lancets 1/day 11/06/20   Romero Belling, MD  Lancets (FREESTYLE) lancets Use 1 lancet once daily as directed to test blood glucose. 11/06/20     Lancets MISC Use as directed twice per day E11.9 09/23/20   [name], MD  meloxicam (MOBIC) 15 MG tablet Take 1 tablet (15 mg total) by mouth daily. 06/07/22   Richardean Sale, DO  metFORMIN (GLUCOPHAGE-XR) 500 MG 24 hr tablet Take 2 tablets (1,000 mg total) by mouth 2 (two) times daily with a meal. 06/23/22   Carlus Pavlov, MD  Multiple Vitamins-Minerals (MULTIVITAMIN WITH MINERALS) tablet Take 1 tablet by mouth daily.    [provider]  nitroGLYCERIN (NITROSTAT) 0.4 MG SL tablet Place 1 tablet (0.4 mg total)  under the tongue every 5 (five) minutes as needed for chest pain. 12/10/14   Rosalio Macadamia, NP  oxyCODONE-acetaminophen (PERCOCET/ROXICET) 5-325 MG tablet Take 1 tablet by mouth every 6 (six) hours as needed for severe pain. 06/11/22   Terrilee Files, MD  PEG-KCl-NaCl-NaSulf-Na Asc-C (PLENVU) 140 g SOLR Take 1 kit by mouth as directed. 10/12/20   Rachael Fee, MD  PRESCRIPTION MEDICATION Inhale into the lungs at bedtime. CPAP    [provider]  sacubitril-valsartan (ENTRESTO) 49-51 MG Take 1 tablet by mouth 2 (two) times daily 06/02/22   Lewayne Bunting, MD  Semaglutide (RYBELSUS) 14 MG TABS Take 1 tablet by mouth once daily 02/07/22   Carlus Pavlov, MD  spironolactone (ALDACTONE) 25 MG tablet Take 1 tablet by mouth once daily 02/24/22   Lewayne Bunting, MD    Family History Family History  Problem Relation Age of Onset  Hypertension Other    Heart attack Mother    Heart disease Mother    Diabetes Mother    Heart attack Father    Heart disease Father    Diabetes Father    Asthma Brother    Diabetes Maternal Grandfather     Social History Social History   Tobacco Use   Smoking status: Never   Smokeless tobacco: Never   Tobacco comments:    never  Vaping Use   Vaping Use: Never used  Substance Use Topics   Alcohol use: No   Drug use: No     Allergies   Patient has no known allergies.   Review of Systems Review of Systems  HENT:  Positive for congestion.   Respiratory:  Positive for cough.      Physical Exam Triage Vital Signs ED Triage Vitals  Enc Vitals Group     BP 07/07/22 1504 (!) 154/83     Pulse Rate 07/07/22 1504 81     Resp 07/07/22 1504 18     Temp 07/07/22 1504 97.8 F (36.6 C)     Temp Source 07/07/22 1504 Oral     SpO2 07/07/22 1504 96 %     Weight --      Height --      Head Circumference --      Peak Flow --      Pain Score 07/07/22 1503 0     Pain Loc --      Pain Edu? --      Excl. in [place]? --    No data  found.  Updated Vital Signs BP (!) 154/83 (BP Location: Right Arm)   Pulse 81   Temp 97.8 F (36.6 C) (Oral)   Resp 18   SpO2 96%   Visual Acuity Right Eye Distance:   Left Eye Distance:   Bilateral Distance:    Right Eye Near:   Left Eye Near:    Bilateral Near:     Physical Exam Vitals and nursing note reviewed.  Constitutional:      General: He is not in acute distress.    Appearance: Normal appearance. He is not ill-appearing or toxic-appearing.  HENT:     Head: Normocephalic and atraumatic.     Right Ear: Tympanic membrane and ear canal normal.     Left Ear: Tympanic membrane and ear canal normal.     Nose: Congestion present.     Mouth/Throat:     Mouth: Mucous membranes are moist.     Pharynx: No oropharyngeal exudate or posterior oropharyngeal erythema.  Eyes:     Pupils: Pupils are equal, round, and reactive to light.  Cardiovascular:     Rate and Rhythm: Normal rate and regular rhythm.     Heart sounds: Normal heart sounds.  Pulmonary:     Effort: Pulmonary effort is normal.     Breath sounds: Normal breath sounds.  Musculoskeletal:     Cervical back: Normal range of motion and neck supple.  Lymphadenopathy:     Cervical: No cervical adenopathy.  Skin:    General: Skin is warm and dry.  Neurological:     General: No focal deficit present.     Mental Status: He is alert and oriented to person, place, and time.  Psychiatric:        Mood and Affect: Mood normal.        Behavior: Behavior normal.      UC Treatments / Results  Labs (all labs  ordered are listed, but only abnormal results are displayed) Labs Reviewed  SARS CORONAVIRUS 2 (TAT 6-24 HRS)    EKG   Radiology No results found.  Procedures Procedures (including critical care time)  Medications Ordered in UC Medications - No data to display  Initial Impression / Assessment and Plan / UC Course  I have reviewed the triage vital signs and the nursing notes.  Pertinent labs &  imaging results that were available during my care of the patient were reviewed by me and considered in my medical decision making (see chart for details).    Reviewed exam and symptoms with patient.  No red flags on exam. Discussed with patient viral illness and symptomatic treatment  Tessalon and Promethazine DM as needed cough COVID PCR and will contact with results Rest and fluids Follow up with PCP in 2-3 days for re-check  ER precautions reviewed and pt verbalized understanding  Final Clinical Impressions(s) / UC Diagnoses   Final diagnoses:  Acute cough  Viral upper respiratory tract infection     Discharge Instructions      Tessalon as needed for cough during the day as this is nondrowsy Promethazine DM at night for cough.  Please note this medication can make you drowsy.  Do not drink alcohol or drive while on this medication The clinic will contact you with results of your COVID testing if positive Rest and fluids Follow-up with your PCP 2 to 3 days for recheck Please go to emergency room if you have any worsening symptoms   ED Prescriptions     Medication Sig Dispense Auth. Provider   benzonatate (TESSALON) 200 MG capsule Take 1 capsule (200 mg total) by mouth 3 (three) times daily as needed for cough. 20 capsule Melynda Ripple, NP   promethazine-dextromethorphan (PROMETHAZINE-DM) 6.25-15 MG/5ML syrup Take 5 mLs by mouth at bedtime as needed for cough. 118 mL Melynda Ripple, NP      PDMP not reviewed this encounter.   Melynda Ripple, NP 07/07/22 1530

## 2022-07-07 NOTE — ED Triage Notes (Signed)
Pt c/o congestion, and cough for about a day.   Home interventions: none

## 2022-07-08 LAB — SARS CORONAVIRUS 2 (TAT 6-24 HRS): SARS Coronavirus 2: NEGATIVE

## 2022-07-22 ENCOUNTER — Other Ambulatory Visit: Payer: Self-pay | Admitting: Internal Medicine

## 2022-07-22 ENCOUNTER — Encounter: Payer: Self-pay | Admitting: Internal Medicine

## 2022-07-22 ENCOUNTER — Other Ambulatory Visit (HOSPITAL_COMMUNITY): Payer: Self-pay

## 2022-07-22 MED ORDER — DAPAGLIFLOZIN PROPANEDIOL 5 MG PO TABS
5.0000 mg | ORAL_TABLET | Freq: Every day | ORAL | 3 refills | Status: DC
  Filled 2022-07-22: qty 90, 90d supply, fill #0
  Filled 2022-11-06 – 2022-11-17 (×2): qty 90, 90d supply, fill #1
  Filled 2023-03-09 – 2023-03-16 (×2): qty 90, 90d supply, fill #2

## 2022-07-23 ENCOUNTER — Other Ambulatory Visit (HOSPITAL_COMMUNITY): Payer: Self-pay

## 2022-08-03 NOTE — Progress Notes (Deleted)
   I, [name], LAT, ATC acting as a scribe for [name], MD.  Tyler [name] is a 47 y.o. male who presents to [place] at [facility] today for continued right lower leg swelling. Pt was last seen by Dr.Corey on 06/30/22 and the R medial gastroc hematoma was unsuccessfully attempted to be aspirated and pt was given a rx for compression stockings. Today, pt reports  Dx testing: 06/11/22 R LE vasc US 06/05/22 R LE vasc US              Pertinent review of systems: ***  Relevant historical information: ***   Exam:  There were no vitals taken for this visit. General: Well Developed, well nourished, and in no acute distress.   MSK: ***    Lab and Radiology Results No results found for this or any previous visit (from the past 72 hour(s)). No results found.     Assessment and Plan: 47 y.o. male with ***   PDMP not reviewed this encounter. No orders of the defined types were placed in this encounter.  No orders of the defined types were placed in this encounter.    Discussed warning signs or symptoms. Please see discharge instructions. Patient expresses understanding.   ***

## 2022-08-04 ENCOUNTER — Ambulatory Visit: Payer: Federal, State, Local not specified - PPO | Admitting: Family Medicine

## 2022-08-15 ENCOUNTER — Telehealth: Payer: Self-pay

## 2022-08-15 ENCOUNTER — Ambulatory Visit: Payer: Self-pay

## 2022-08-15 ENCOUNTER — Ambulatory Visit: Payer: Federal, State, Local not specified - PPO | Admitting: Family Medicine

## 2022-08-15 VITALS — BP 132/82 | HR 83 | Ht 69.0 in | Wt 372.0 lb

## 2022-08-15 DIAGNOSIS — M170 Bilateral primary osteoarthritis of knee: Secondary | ICD-10-CM | POA: Diagnosis not present

## 2022-08-15 DIAGNOSIS — M25561 Pain in right knee: Secondary | ICD-10-CM | POA: Diagnosis not present

## 2022-08-15 DIAGNOSIS — M25562 Pain in left knee: Secondary | ICD-10-CM

## 2022-08-15 DIAGNOSIS — E1159 Type 2 diabetes mellitus with other circulatory complications: Secondary | ICD-10-CM

## 2022-08-15 DIAGNOSIS — G8929 Other chronic pain: Secondary | ICD-10-CM

## 2022-08-15 NOTE — Telephone Encounter (Signed)
VOB initiated for BILAT Zilretta - Bilat knee OA.

## 2022-08-15 NOTE — Telephone Encounter (Signed)
Run benefits for [employer] (also Therapist, occupational)

## 2022-08-15 NOTE — Patient Instructions (Signed)
Thank you for coming in today.   We will work to authorize the gel shots  You will hear from our office once approved to get scheduled.

## 2022-08-15 NOTE — Telephone Encounter (Signed)
Check benefits for Zilretta (also checking visco)

## 2022-08-15 NOTE — Telephone Encounter (Signed)
VOB initiated for Orthovisc  BILAT knee OA

## 2022-08-15 NOTE — Progress Notes (Signed)
   I, Tyler [name], LAT, ATC acting as a scribe for Tyler Leader, MD.  Tyler [name] is a 47 y.o. male who presents to [place] at [facility] today for bilateral knee pain. Pt was last seen by Dr. [name] on 06/30/22 s/p ED visit for R LE pain and swelling, discuss aspiration but deferred d/t coagulation of the clot. Today, pt c/o bilat knee pain along the anterior aspect.  Pt works as a Recruitment consultant for [name].  Knee swelling: yes- "tightness" Treatments tried: meloxicam, motrin  Dx testing: 06/11/22 R LE vasc US 06/05/22 R LE vasc US             06/02/22 R knee XR   Pertinent review of systems: No fevers or chills  Relevant historical information: Diabetes.  Obesity.  On Rybelsus.   Exam:  BP 132/82   Pulse 83   Ht 5' 9"$  (1.753 m)   Wt (!) 372 lb (168.7 kg)   SpO2 96%   BMI 54.93 kg/m  General: Well Developed, well nourished, and in no acute distress.   MSK: Right knee moderate effusion normal motion with crepitation.  Tender palpation anterior knee and posterior medial knee.  Left knee: Moderate effusion normal motion with crepitation.  Tender palpation anterior and posterior medial knee.    Lab and Radiology Results   Diagnostic Limited MSK Ultrasound of: Bilateral knees Right knee: Moderate effusion.  Moderate Baker's cyst.  Intact quad and patellar tendons. Right knee: Mild joint effusion.  Moderate Baker's cyst.  Intact quad and patellar tendons. Impression: Knee effusion and Baker's cyst present bilaterally.     Assessment and Plan: 47 y.o. male with knee pain and swelling bilaterally.  He had steroid injection bilateral knees about 2-1/2 months ago.  He had right knee pain and bleeding following the knee injection which could have been coincidental or could have been because of the knee injection and is reluctant to consider a typical repeat steroid injection.  He is also not excited about the idea of an aspiration and  injection of his Baker's cyst today.  Will work on authorization for hyaluronic acid injection and Zilretta given his DJD and knee pain bilaterally.  We talked about obesity.  Obesity is a driver of his knee pain.  Additionally he will eventually need a knee replacement and his BMI is well above 40 which is the cutoff for knee replacement.  1 potential avenue for weight management for him will be switching Rybelsus to Ozempic.  That should be more effective for the weight loss.   PDMP not reviewed this encounter. Orders Placed This Encounter  Procedures   [place] LIMITED JOINT SPACE STRUCTURES LOW BILAT(NO LINKED CHARGES)    Order Specific Question:   Reason for Exam (SYMPTOM  OR DIAGNOSIS REQUIRED)    Answer:   bilateral knee pain    Order Specific Question:   Preferred imaging location?    Answer:   [place]   No orders of the defined types were placed in this encounter.    Discussed warning signs or symptoms. Please see discharge instructions. Patient expresses understanding.   The above documentation has been reviewed and is accurate and complete Tyler [name], M.D.

## 2022-08-16 NOTE — Telephone Encounter (Signed)
Prior auth required for [employer]

## 2022-08-16 NOTE — Telephone Encounter (Signed)
Copay: $45 Co-insurance: n/a Deductible: does not apply  Prior Auth: REQUIRED

## 2022-08-16 NOTE — Telephone Encounter (Signed)
prior authorization is required for this patient's plan. [employer] can assist with the prior authorization. For Prior Auth support, please fax clinical information to [phone], or upload it using the online portal. No medical notes or referrals needed. Patient has a Fully [employer] Option PPO plan with an effective date of 08/20/2005. Plan follows FEP guidelines. Patient responsibility for [phone] Tyler [name]) will be 30% with the remaining covered at 70% by the payer at the contracted rate. Patient is responsible for a $45 copay for CPT code 20611 with the remaining covered at 100% by the payer at contracted rate. Deductibles do not apply to these services. Patient has a $45 copay whether or not an office visit is billed. Only one copay applies per date of service. Patient has an out of pocket maximum of $6500 and has accumulated $397.97. If out of pocket is met, coverage goes to 100% and copay will no longer apply. Patient has a calendar year policy starting from June 20, 2022 to June 20, 2023

## 2022-08-17 NOTE — Telephone Encounter (Signed)
Gelsyn-3, Gel-ONE, Hyalgan, and Supartz are preferred products.   VOB initiated for Gelsyn-3.

## 2022-08-17 NOTE — Telephone Encounter (Signed)
Unable to process prior auth via CoverMyMeds.

## 2022-08-17 NOTE — Telephone Encounter (Signed)
Prior auth initiated via [employer] portal.

## 2022-08-18 NOTE — Telephone Encounter (Signed)
System [id number] PM Precert Submitted by office.  [employer] 08/17/2022 12:07 PM The PA form has been uploaded for your review. Please locate the button titled, 'Review PA' on the main dashboard under 'Active and Recently Completed Cases'.  After you have located your case id number, under 'next steps', click on the 'Review PA' button to review the attached documents. If everything is accurate, please hit the button that states, "Approve & Submit to Insurance". If you have any questions please let us know. Thank you   System Note 08/17/2022 10:43 AM New Document Attached!  System [id number] AM Precert changes requested by office.  [employer] 08/15/2022 04:31 PM [id number]

## 2022-08-18 NOTE — Telephone Encounter (Signed)
Prior auth required for Gelsyn-3  Patient has a fully funded Basic option 112 Commercial PPO with an effective date of 08/20/2005. Plan follows FEP [place] guidelines. Plan covers at 70% of allowable amount for GELSYN-3 J7328 and 100% of allowable amount for procedure 20610. Deductibles do not apply to these services. Patient has a $45 copay whether, or not an office visit is billed. Highest copay will apply. If out of pocket is met, coverage goes to 100% and copay will no longer apply. No referrals needed. Administration 20611 is covered at 100% allowable amount subject to up to $150 copay. *Rep indicated that, if 20611 and specialist office visits are billed at the same day, member will be responsible for higher copay of $150 per date of service. A pre-cert is required for this patient's plan. If you would like assistance with obtaining pre-cert, please upload all clinical information/medical notes to the case or fax to [id number], so that the [place] team may initiate authorization on your behalf. Once they are uploaded, please notify us via case notes or contact our offices directly at [phone]. Medical notes are required with the claims. As per rep Dr. Yehuda [place] is showing in network under given NPI [id number]. Rep indicates facility is showing in network under NPI [id number]. Plan runs on a calendar year. BCBS FEP medical policy states that they will only consider payment for HA injections every 12 months. If the patient has been treated within the last 12 months plan reserves the right to deny claims. Reference # [id number]

## 2022-08-19 NOTE — Telephone Encounter (Signed)
Left message for patient to call back to schedule.  °

## 2022-08-19 NOTE — Telephone Encounter (Signed)
Fax received from [facility] regarding prior auth:  "No auth required for J3304 when using medical benefits. Medical drugs must meet medical necessity requirements. If dispensed by Specialty Rx the request would go through [place] [phone]"

## 2022-08-19 NOTE — Telephone Encounter (Signed)
Pt is ready for scheduling on or after 08/19/22  Primary: BCBS [place] FEP PPO Zilretta co-insurance: 30% U/S guidance co-insurance: 0% Co-pay: $45  Deductible: does not apply  Prior Auth: "No auth required for J3304 when using medical benefits. Medical drugs must meet medical necessity requirements. If dispensed by Specialty Rx the request would go through [place] [phone]"   Secondary: n/a Zilretta co-insurance:  U/S guidance (20611) co-insurance:   Deductible:   Prior Auth:  PA# Valid:     ** This summary of benefits is an estimation of the patient's out-of-pocket cost. Exact cost may very based on individual plan coverage.

## 2022-08-19 NOTE — Telephone Encounter (Addendum)
Prior auth initiated via fax on 08/18/22.

## 2022-08-29 ENCOUNTER — Encounter: Payer: Self-pay | Admitting: Internal Medicine

## 2022-08-29 ENCOUNTER — Ambulatory Visit (INDEPENDENT_AMBULATORY_CARE_PROVIDER_SITE_OTHER): Payer: Federal, State, Local not specified - PPO | Admitting: Internal Medicine

## 2022-08-29 VITALS — BP 124/64 | HR 73 | Temp 98.4°F | Ht 69.0 in | Wt 377.0 lb

## 2022-08-29 DIAGNOSIS — Z125 Encounter for screening for malignant neoplasm of prostate: Secondary | ICD-10-CM

## 2022-08-29 DIAGNOSIS — Z0001 Encounter for general adult medical examination with abnormal findings: Secondary | ICD-10-CM | POA: Diagnosis not present

## 2022-08-29 DIAGNOSIS — E1159 Type 2 diabetes mellitus with other circulatory complications: Secondary | ICD-10-CM | POA: Diagnosis not present

## 2022-08-29 DIAGNOSIS — G8929 Other chronic pain: Secondary | ICD-10-CM

## 2022-08-29 DIAGNOSIS — D179 Benign lipomatous neoplasm, unspecified: Secondary | ICD-10-CM | POA: Insufficient documentation

## 2022-08-29 DIAGNOSIS — E1165 Type 2 diabetes mellitus with hyperglycemia: Secondary | ICD-10-CM | POA: Diagnosis not present

## 2022-08-29 DIAGNOSIS — E559 Vitamin D deficiency, unspecified: Secondary | ICD-10-CM

## 2022-08-29 DIAGNOSIS — D1724 Benign lipomatous neoplasm of skin and subcutaneous tissue of left leg: Secondary | ICD-10-CM

## 2022-08-29 DIAGNOSIS — M25562 Pain in left knee: Secondary | ICD-10-CM

## 2022-08-29 DIAGNOSIS — E785 Hyperlipidemia, unspecified: Secondary | ICD-10-CM

## 2022-08-29 DIAGNOSIS — I10 Essential (primary) hypertension: Secondary | ICD-10-CM

## 2022-08-29 DIAGNOSIS — E538 Deficiency of other specified B group vitamins: Secondary | ICD-10-CM

## 2022-08-29 DIAGNOSIS — M25561 Pain in right knee: Secondary | ICD-10-CM | POA: Diagnosis not present

## 2022-08-29 LAB — CBC WITH DIFFERENTIAL/PLATELET
Basophils Absolute: 0.1 10*3/uL (ref 0.0–0.1)
Basophils Relative: 0.9 % (ref 0.0–3.0)
Eosinophils Absolute: 0.4 10*3/uL (ref 0.0–0.7)
Eosinophils Relative: 5.4 % — ABNORMAL HIGH (ref 0.0–5.0)
HCT: 43.3 % (ref 39.0–52.0)
Hemoglobin: 14.3 g/dL (ref 13.0–17.0)
Lymphocytes Relative: 29.7 % (ref 12.0–46.0)
Lymphs Abs: 2.3 10*3/uL (ref 0.7–4.0)
MCHC: 33 g/dL (ref 30.0–36.0)
MCV: 86.8 fl (ref 78.0–100.0)
Monocytes Absolute: 0.8 10*3/uL (ref 0.1–1.0)
Monocytes Relative: 10 % (ref 3.0–12.0)
Neutro Abs: 4.2 10*3/uL (ref 1.4–7.7)
Neutrophils Relative %: 54 % (ref 43.0–77.0)
Platelets: 252 10*3/uL (ref 150.0–400.0)
RBC: 4.99 Mil/uL (ref 4.22–5.81)
RDW: 14.6 % (ref 11.5–15.5)
WBC: 7.8 10*3/uL (ref 4.0–10.5)

## 2022-08-29 LAB — HEPATIC FUNCTION PANEL
ALT: 22 U/L (ref 0–53)
AST: 17 U/L (ref 0–37)
Albumin: 4.1 g/dL (ref 3.5–5.2)
Alkaline Phosphatase: 78 U/L (ref 39–117)
Bilirubin, Direct: 0.1 mg/dL (ref 0.0–0.3)
Total Bilirubin: 0.3 mg/dL (ref 0.2–1.2)
Total Protein: 7.3 g/dL (ref 6.0–8.3)

## 2022-08-29 LAB — PSA: PSA: 1.11 ng/mL (ref 0.10–4.00)

## 2022-08-29 LAB — LIPID PANEL
Cholesterol: 143 mg/dL (ref 0–200)
HDL: 38.2 mg/dL — ABNORMAL LOW (ref >39.00)
LDL Cholesterol: 86 mg/dL (ref 0–99)
NonHDL: 104.85
Total CHOL/HDL Ratio: 4
Triglycerides: 94 mg/dL (ref 0.0–149.0)
VLDL: 18.8 mg/dL (ref 0.0–40.0)

## 2022-08-29 LAB — BASIC METABOLIC PANEL
BUN: 17 mg/dL (ref 6–23)
CO2: 29 mEq/L (ref 19–32)
Calcium: 9.5 mg/dL (ref 8.4–10.5)
Chloride: 102 mEq/L (ref 96–112)
Creatinine, Ser: 1.04 mg/dL (ref 0.40–1.50)
GFR: 84.77 mL/min (ref >60.00)
Glucose, Bld: 90 mg/dL (ref 70–99)
Potassium: 4.2 mEq/L (ref 3.5–5.1)
Sodium: 139 mEq/L (ref 135–145)

## 2022-08-29 LAB — HEMOGLOBIN A1C: Hgb A1c MFr Bld: 6.7 % — ABNORMAL HIGH (ref 4.6–6.5)

## 2022-08-29 LAB — VITAMIN B12: Vitamin B-12: 309 pg/mL (ref 211–911)

## 2022-08-29 LAB — MICROALBUMIN / CREATININE URINE RATIO
Creatinine,U: 186.2 mg/dL
Microalb Creat Ratio: 0.4 mg/g (ref 0.0–30.0)
Microalb, Ur: <0.7 mg/dL (ref 0.0–1.9)

## 2022-08-29 LAB — VITAMIN D 25 HYDROXY (VIT D DEFICIENCY, FRACTURES): VITD: 22.17 ng/mL — ABNORMAL LOW (ref 30.00–100.00)

## 2022-08-29 LAB — TSH: TSH: 2.33 u[IU]/mL (ref 0.35–5.50)

## 2022-08-29 NOTE — Telephone Encounter (Signed)
Per visco rep, no prior auth required.  08/22/2022 03:34 PM No Subject Posted by: [employer]  Case Closed : Covered  Benefits delivered; no PA required, No additional information requested, closing case. Please note, this case can be reopened if follow up is needed.  08/22/2022 03:33 PM No Subject Posted by: [employer]  PA Assistance in Progress : Contacted Payer Regarding Prior [employer] [place] - [phone] spoke with [place] D (08/22/2022 318) She did not locate a PA request on file for Orthovisc. As well she double checked and stated an authorization is not needed for this J Code; it just must need medical necessity. I questioned if a "Pre-D" might be needed, she stated that was not required either but is an option if [facility] would want to obtain.

## 2022-08-29 NOTE — Assessment & Plan Note (Signed)
BP Readings from Last 3 Encounters:  08/29/22 124/64  08/15/22 132/82  07/07/22 (!) 154/83   Stable, pt to continue medical treatment norvasc 10 mg qd, coreg 25 mg bid

## 2022-08-29 NOTE — Assessment & Plan Note (Signed)

## 2022-08-29 NOTE — Assessment & Plan Note (Signed)
Worsening, may not be surgical candidate due to weight, ok for referral ortho and  to f/u any worsening symptoms or concerns

## 2022-08-29 NOTE — Assessment & Plan Note (Signed)
Lab Results  Component Value Date   HGBA1C 6.3 (A) 06/06/2022   Stable, pt to continue current medical treatment farxiga 5 mg qd, metformin ER 500 mg - 2 bid, rybelsus 14 mg qd

## 2022-08-29 NOTE — Telephone Encounter (Signed)
Orthov[facility] - BILAT knee OA (Also ran benefits for Gelsyn-3 and Zilretta)  J7324 (Orthovisc) Copay: $45 Co-insurance: 0% Deductible: does not apply  Prior Auth: NOT required per [employer] rep

## 2022-08-29 NOTE — Patient Instructions (Signed)
Please continue all other medications as before, and refills have been done if requested.  Please have the pharmacy call with any other refills you may need.  Please continue your efforts at being more active, low cholesterol diet, and weight control.  You are otherwise up to date with prevention measures today.  Please keep your appointments with your specialists as you may have planned  You will be contacted regarding the referral for: Dr Wynelle Link orthopedic  Please go to the LAB at the blood drawing area for the tests to be done  You will be contacted by phone if any changes need to be made immediately.  Otherwise, you will receive a letter about your results with an explanation, but please check with MyChart first.  Please remember to sign up for MyChart if you have not done so, as this will be important to you in the future with finding out test results, communicating by private email, and scheduling acute appointments online when needed.  Please make an Appointment to return in 6 months, or sooner if needed

## 2022-08-29 NOTE — Assessment & Plan Note (Signed)
Lab Results  Component Value Date   LDLCALC 46 08/18/2021   Stable, pt to continue current statin lipitor 80 mg qd

## 2022-08-29 NOTE — Assessment & Plan Note (Signed)
Last vitamin D Lab Results  Component Value Date   VD25OH 36.50 08/18/2021   Low, to start oral replacement

## 2022-08-29 NOTE — Progress Notes (Signed)
Patient ID: Tyler [name], male   DOB: 09-29-1975, 47 y.o.   MRN: [id number]         Chief Complaint:: wellness exam and Physical (Knee pain, seen by sports med and was told he has artritis)  , left medial upper thigh fatty mass, hld, htn, dm, low vit d       HPI:  Tyler [name] is a 46 y.o. male here for wellness exam; declines covid booster o/w up to date                        Also CBGs ok at home in low 100s.  C/o bilateral knees s/p cortisone to each knee in fall 2023   May have had right bakers cyst rupture with severe even bruised appearance to whole leg below the knee;  asks for different ortho referral.  Pt denies chest pain, increased sob or doe, wheezing, orthopnea, PND, increased LE swelling, palpitations, dizziness or syncope.   Pt denies polydipsia, polyuria, or new focal neuro s/s.   Marland Kitchen Pt denies fever, wt loss, night sweats, loss of appetite, or other constitutional symptoms  Also has enlarging but painless fatty mass unusual to the left upper medial thigh.    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   Wt Readings from Last 3 Encounters:  08/29/22 (!) 377 lb (171 kg)  08/15/22 (!) 372 lb (168.7 kg)  06/30/22 (!) 381 lb (172.8 kg)   BP Readings from Last 3 Encounters:  08/29/22 124/64  08/15/22 132/82  07/07/22 (!) 154/83   Immunization History  Administered Date(s) Administered   Influenza Whole 04/01/2009, 03/09/2010   Influenza,inj,Quad PF,6+ Mos 04/27/2013, 02/11/2014, 06/09/2015, 04/05/2016, 07/18/2017, 06/22/2018, 04/06/2019, 03/25/2021, 02/28/2022   PFIZER(Purple Top)SARS-COV-2 Vaccination 08/29/2019, 09/19/2019, 05/12/2020   PNEUMOCOCCAL CONJUGATE-20 01/24/2022   Pneumococcal Polysaccharide-23 10/21/2014   Td 10/16/2008   Tdap 08/28/2018  There are no preventive care reminders to display for this patient.    Past Medical History:  Diagnosis Date   ACHILLES TENDINITIS, BILATERAL 10/16/2008   ALLERGIC RHINITIS 08/13/2009    CARDIOMYOPATHY 04/21/2009   CHF (congestive heart failure) ([place])    CONGESTIVE HEART FAILURE 12/04/2007   CORONARY ARTERY DISEASE 12/04/2007   GERD (gastroesophageal reflux disease) 11/01/2011   HYPERLIPIDEMIA 03/12/2007   HYPERTENSION 03/12/2007   OBESITY 04/21/2009   Plantar fasciitis, bilateral 11/01/2011   SLEEP APNEA 03/12/2007   Past Surgical History:  Procedure Laterality Date   CARDIAC CATHETERIZATION N/A 12/19/2014   Procedure: Left Heart Cath and Coronary Angiography;  Surgeon: Belva Crome, MD;  Location: [facility];  Service: Cardiovascular;  Laterality: N/A;   COLONOSCOPY WITH PROPOFOL N/A 11/19/2020   Procedure: COLONOSCOPY WITH PROPOFOL;  Surgeon: Milus Banister, MD;  Location: [facility];  Service: Endoscopy;  Laterality: N/A;   NO PAST SURGERIES      reports that he has never smoked. He has never used smokeless tobacco. He reports that he does not drink alcohol and does not use drugs. family history includes Asthma in his brother; Diabetes in his father, maternal grandfather, and mother; Heart attack in his father and mother; Heart disease in his father and mother; Hypertension in an other family member. No Known Allergies Current Outpatient Medications on File Prior to Visit  Medication Sig Dispense Refill   albuterol (VENTOLIN HFA) 108 (90 Base) MCG/ACT inhaler Inhale 2 puffs into the lungs every 6 (six) hours as needed for  wheezing. 6.7 g 11   amLODipine (NORVASC) 10 MG tablet TAKE 1 TABLET BY MOUTH ONCE DAILY . APPOINTMENT REQUIRED FOR FUTURE REFILLS 90 tablet 3   aspirin EC 81 MG tablet Take 81 mg by mouth daily.     atorvastatin (LIPITOR) 80 MG tablet Take 1 tablet (80 mg total) by mouth daily. 90 tablet 2   Blood Glucose Monitoring Suppl (FREESTYLE LITE) DEVI 1 Device by Does not apply route daily. 100 each 3   Blood Glucose Monitoring Suppl (FREESTYLE LITE) w/Device KIT Use daily as directed to test blood glucose. 1 kit 3   budesonide-formoterol (SYMBICORT)  160-4.5 MCG/ACT inhaler INHALE 2 PUFFS BY MOUTH TWICE A DAY 10.2 g 12   carvedilol (COREG) 25 MG tablet Take 1 tablet (25 mg total) by mouth 2 (two) times daily. 180 tablet 3   cetirizine (ZYRTEC) 10 MG tablet Take 10 mg by mouth daily.     Cholecalciferol (VITAMIN D) 50 MCG (2000 UT) tablet Take 2,000 Units by mouth daily.     dapagliflozin propanediol (FARXIGA) 5 MG TABS tablet Take 1 tablet (5 mg total) by mouth daily before breakfast. 90 tablet 3   ezetimibe (ZETIA) 10 MG tablet Take 1 tablet by mouth once daily 90 tablet 3   furosemide (LASIX) 20 MG tablet Take 1 tablet by mouth once daily 90 tablet 2   glucose blood (FREESTYLE LITE) test strip Use 1 strip to check blood glucose once daily as directed. 100 each 12   glucose blood (FREESTYLE TEST STRIPS) test strip 1 each by Other route daily. And lancets 1/day 100 each 12   Lancets (FREESTYLE) lancets Use 1 lancet once daily as directed to test blood glucose. 100 each 12   Lancets MISC Use as directed twice per day E11.9 200 each 12   metFORMIN (GLUCOPHAGE-XR) 500 MG 24 hr tablet Take 2 tablets (1,000 mg total) by mouth 2 (two) times daily with a meal. 360 tablet 3   Multiple Vitamins-Minerals (MULTIVITAMIN WITH MINERALS) tablet Take 1 tablet by mouth daily.     nitroGLYCERIN (NITROSTAT) 0.4 MG SL tablet Place 1 tablet (0.4 mg total) under the tongue every 5 (five) minutes as needed for chest pain. 25 tablet 3   oxyCODONE-acetaminophen (PERCOCET/ROXICET) 5-325 MG tablet Take 1 tablet by mouth every 6 (six) hours as needed for severe pain. 15 tablet 0   PEG-KCl-NaCl-NaSulf-Na Asc-C (PLENVU) 140 g SOLR Take 1 kit by mouth as directed. 3 each 0   PRESCRIPTION MEDICATION Inhale into the lungs at bedtime. CPAP     promethazine-dextromethorphan (PROMETHAZINE-DM) 6.25-15 MG/5ML syrup Take 5 mLs by mouth at bedtime as needed for cough. 118 mL 0   sacubitril-valsartan (ENTRESTO) 49-51 MG Take 1 tablet by mouth 2 (two) times daily 60 tablet 3    Semaglutide (RYBELSUS) 14 MG TABS Take 1 tablet by mouth once daily 90 tablet 3   spironolactone (ALDACTONE) 25 MG tablet Take 1 tablet by mouth once daily 90 tablet 3   No current facility-administered medications on file prior to visit.        ROS:  All others reviewed and negative.  Objective        PE:  BP 124/64   Pulse 73   Temp 98.4 F (36.9 C) (Oral)   Ht '5\' 9"'$  (1.753 m)   Wt (!) 377 lb (171 kg)   SpO2 94%   BMI 55.67 kg/m  Constitutional: Pt appears in NAD               HENT: Head: NCAT.                Right Ear: External ear normal.                 Left Ear: External ear normal.                Eyes: . Pupils are equal, round, and reactive to light. Conjunctivae and EOM are normal               Nose: without d/c or deformity               Neck: Neck supple. Gross normal ROM               Cardiovascular: Normal rate and regular rhythm.                 Pulmonary/Chest: Effort normal and breath sounds without rales or wheezing.                Abd:  Soft, NT, ND, + BS, no organomegaly               Neurological: Pt is alert. At baseline orientation, motor grossly intact               Skin: Skin is warm. No rashes, no other new lesions, LE edema - none; bilateral knees with bony degenerative changes left > right; also left upper medial thigh with large 7-8 cm diameter discrete fatty mass               Psychiatric: Pt behavior is normal without agitation   Micro: none  Cardiac tracings I have personally interpreted today:  none  Pertinent Radiological findings (summarize): none   Lab Results  Component Value Date   WBC 9.1 06/11/2022   HGB 12.4 (L) 06/11/2022   HCT 38.7 (L) 06/11/2022   PLT 242 06/11/2022   GLUCOSE 96 06/11/2022   CHOL 96 08/18/2021   TRIG 96.0 08/18/2021   HDL 30.80 (L) 08/18/2021   LDLDIRECT 183.6 08/02/2011   LDLCALC 46 08/18/2021   ALT 23 08/18/2021   AST 17 08/18/2021   NA 141 06/11/2022   K 4.3 06/11/2022   CL 109  06/11/2022   CREATININE 0.99 06/11/2022   BUN 20 06/11/2022   CO2 26 06/11/2022   TSH 2.01 08/18/2021   PSA 0.64 08/18/2021   INR 1.1 06/11/2022   HGBA1C 6.3 (A) 06/06/2022   MICROALBUR 1.0 08/18/2021   Assessment/Plan:  Tyler [name] is a 47 y.o. Other or two or more races [6] Black or African American [2] male with  has a past medical history of ACHILLES TENDINITIS, BILATERAL (10/16/2008), ALLERGIC RHINITIS (08/13/2009), CARDIOMYOPATHY (04/21/2009), CHF (congestive heart failure) ([facility]), CONGESTIVE HEART FAILURE (12/04/2007), CORONARY ARTERY DISEASE (12/04/2007), GERD (gastroesophageal reflux disease) (11/01/2011), HYPERLIPIDEMIA (03/12/2007), HYPERTENSION (03/12/2007), OBESITY (04/21/2009), Plantar fasciitis, bilateral (11/01/2011), and SLEEP APNEA (03/12/2007).  Encounter for well adult exam with abnormal findings Age and sex appropriate education and counseling updated with regular exercise and diet Referrals for preventative services - none needed Immunizations addressed - declines covid booster Smoking counseling  - none needed Evidence for depression or other mood disorder - none significant Most recent labs reviewed. I have personally reviewed and have noted: 1) the patient's medical and social history 2) The patient's current medications and supplements 3) The patient's height, weight,  and BMI have been recorded in the chart\  Dyslipidemia Lab Results  Component Value Date   LDLCALC 46 08/18/2021   Stable, pt to continue current statin lipitor 80 mg qd   Essential hypertension BP Readings from Last 3 Encounters:  08/29/22 124/64  08/15/22 132/82  07/07/22 (!) 154/83   Stable, pt to continue medical treatment norvasc 10 mg qd, coreg 25 mg bid   Lipoma Recent onset worsening, benign appearing, pt offered referral to general surgury but declines for now  Poorly controlled type 2 diabetes mellitus with circulatory disorder ([facility]) Lab Results  Component Value Date    HGBA1C 6.3 (A) 06/06/2022   Stable, pt to continue current medical treatment farxiga 5 mg qd, metformin ER 500 mg - 2 bid, rybelsus 14 mg qd   Vitamin D deficiency Last vitamin D Lab Results  Component Value Date   VD25OH 36.50 08/18/2021   Low, to start oral replacement   Bilateral knee pain Worsening, may not be surgical candidate due to weight, ok for referral ortho and  to f/u any worsening symptoms or concerns    Followup: Return in about 6 months (around 03/01/2023).  Cathlean Cower, MD 08/29/2022 8:55 PM [facility]

## 2022-08-29 NOTE — Assessment & Plan Note (Signed)
Recent onset worsening, benign appearing, pt offered referral to general surgury but declines for now

## 2022-08-30 LAB — URINALYSIS, ROUTINE W REFLEX MICROSCOPIC
Bilirubin Urine: NEGATIVE
Hgb urine dipstick: NEGATIVE
Leukocytes,Ua: NEGATIVE
Nitrite: NEGATIVE
RBC / HPF: NONE SEEN (ref 0–?)
Specific Gravity, Urine: 1.02 (ref 1.000–1.030)
Total Protein, Urine: NEGATIVE
Urine Glucose: >=1000 — AB
Urobilinogen, UA: 0.2 (ref 0.0–1.0)
pH: 6 (ref 5.0–8.0)

## 2022-09-01 ENCOUNTER — Telehealth: Payer: Self-pay

## 2022-09-01 NOTE — Telephone Encounter (Signed)
Atalissa - if pt would like to proceed with Joan Mayans, we will need to send Rx to [facility]  (Also ran benefits for Orthovisc and Zilretta)  PA#  Valid: 08/01/22-08/31/23 for 6 injections

## 2022-09-02 NOTE — Telephone Encounter (Signed)
Left message for patient to call back to schedule.  °

## 2022-09-06 ENCOUNTER — Telehealth: Payer: Self-pay | Admitting: Family Medicine

## 2022-09-06 NOTE — Telephone Encounter (Signed)
Morphies, Carson J2 hours ago (7:42 AM)   CM [facility] called and left a message after hours stating that they are needing a prescription for Gelsyn-3 but they do not have any directions. They can be reached at [phone] or by fax at [phone].   Please advise?

## 2022-09-06 NOTE — Telephone Encounter (Signed)
[facility] called and left a message after hours stating that they are needing a prescription for Gelsyn-3 but they do not have any directions. They can be reached at [phone] or by fax at [phone].  Please advise?

## 2022-09-07 NOTE — Telephone Encounter (Signed)
Called pt, pt unavailable, left message to have pt call the office (need to discuss coverage of Zilretta, Orthovisc, and Zilretta).

## 2022-09-19 DIAGNOSIS — M25561 Pain in right knee: Secondary | ICD-10-CM | POA: Diagnosis not present

## 2022-09-19 DIAGNOSIS — M170 Bilateral primary osteoarthritis of knee: Secondary | ICD-10-CM | POA: Diagnosis not present

## 2022-09-19 DIAGNOSIS — M25562 Pain in left knee: Secondary | ICD-10-CM | POA: Diagnosis not present

## 2022-10-01 ENCOUNTER — Other Ambulatory Visit: Payer: Self-pay | Admitting: Internal Medicine

## 2022-10-01 ENCOUNTER — Other Ambulatory Visit (HOSPITAL_COMMUNITY): Payer: Self-pay

## 2022-10-01 DIAGNOSIS — E1165 Type 2 diabetes mellitus with hyperglycemia: Secondary | ICD-10-CM

## 2022-10-01 MED FILL — Semaglutide Tab 14 MG: ORAL | 90 days supply | Qty: 90 | Fill #0 | Status: AC

## 2022-10-02 ENCOUNTER — Other Ambulatory Visit (HOSPITAL_COMMUNITY): Payer: Self-pay

## 2022-10-03 ENCOUNTER — Other Ambulatory Visit (HOSPITAL_COMMUNITY): Payer: Self-pay

## 2022-10-03 ENCOUNTER — Telehealth: Payer: Self-pay

## 2022-10-03 NOTE — Telephone Encounter (Signed)
Pharmacy Patient Advocate Encounter   Received notification from [facility] that prior authorization for eNTRESTO 49-51MG  is required/requested.  Per Test Claim: No P/a is needed. Pt is able to use his insurance as well as the entresto co-pay card which will bring his copay about/around 10.00

## 2022-10-06 ENCOUNTER — Other Ambulatory Visit (HOSPITAL_COMMUNITY): Payer: Self-pay

## 2022-10-10 ENCOUNTER — Ambulatory Visit: Payer: Federal, State, Local not specified - PPO | Admitting: Internal Medicine

## 2022-10-13 ENCOUNTER — Ambulatory Visit: Payer: Federal, State, Local not specified - PPO | Admitting: Internal Medicine

## 2022-10-13 ENCOUNTER — Encounter: Payer: Self-pay | Admitting: Internal Medicine

## 2022-10-13 ENCOUNTER — Other Ambulatory Visit (HOSPITAL_COMMUNITY): Payer: Self-pay

## 2022-10-13 VITALS — BP 116/80 | HR 65 | Ht 69.0 in | Wt 370.2 lb

## 2022-10-13 DIAGNOSIS — E1165 Type 2 diabetes mellitus with hyperglycemia: Secondary | ICD-10-CM | POA: Diagnosis not present

## 2022-10-13 DIAGNOSIS — Z7984 Long term (current) use of oral hypoglycemic drugs: Secondary | ICD-10-CM | POA: Diagnosis not present

## 2022-10-13 DIAGNOSIS — E1159 Type 2 diabetes mellitus with other circulatory complications: Secondary | ICD-10-CM

## 2022-10-13 DIAGNOSIS — E785 Hyperlipidemia, unspecified: Secondary | ICD-10-CM | POA: Diagnosis not present

## 2022-10-13 MED ORDER — RYBELSUS 14 MG PO TABS
14.0000 mg | ORAL_TABLET | Freq: Every day | ORAL | 0 refills | Status: DC
  Filled 2022-10-13 – 2023-01-29 (×2): qty 90, 90d supply, fill #0

## 2022-10-13 MED ORDER — FREESTYLE LIBRE 3 SENSOR MISC
1.0000 | 3 refills | Status: DC
  Filled 2022-10-13: qty 2, 28d supply, fill #0

## 2022-10-13 NOTE — Progress Notes (Signed)
Patient ID: Tyler [name], male   DOB: 08-12-1975, 46 y.o.   MRN: [id number]  HPI: Tyler [name] is a 47 y.o.-year-old male, returning for follow-up for DM2, dx in 2013, non-insulin-dependent, uncontrolled, with complications (CAD, CHF, mild CKD). Pt. previously saw Dr. Everardo All, but last visit with me 4.5 months ago.  Interim history: No increased urination, blurry vision, nausea, chest pain. He switched from regular to diet sodas before last visit.  At this visit he mentions that he is not drinking sweet tea anymore.  He tries to drink more water.  Reviewed HbA1c: Lab Results  Component Value Date   HGBA1C 6.7 (H) 08/29/2022   HGBA1C 6.3 (A) 06/06/2022   HGBA1C 7.4 (A) 02/14/2022   HGBA1C 6.9 (A) 10/18/2021   HGBA1C 7.2 (A) 07/19/2021   HGBA1C 6.2 (A) 04/12/2021   HGBA1C 6.1 (A) 01/11/2021   HGBA1C 6.6 (H) 01/05/2021   HGBA1C 7.1 (A) 11/06/2020   HGBA1C 10.1 (A) 09/25/2020   Pt is on a regimen of: - Metformin ER 2000 in am - Farxiga 5 mg before breakfast - Rybelsus 14 mg before breakfast He had diarrhea with regular metformin.  Pt checks his sugars 0-1x a day and they are: - am: 107-130 >> 90-116  >> 90-102 - 2h after b'fast: n/c - before lunch: n/c - 2h after lunch: n/c - before dinner: n/c - 2h after dinner: n/c - bedtime: n/c - nighttime: n/c Lowest sugar was 90 >> 90. Highest sugar was 130 >> 116.  Glucometer: Freestyle Lite  At last visit, he was drinking 1 Pepsi a day.  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 Water caused muscle cramps.  I strongly advised him about stopping sweet drinks.  - + Mild CKD, last BUN/creatinine:  Lab Results  Component Value Date   BUN 17 08/29/2022   BUN 20 06/11/2022   CREATININE 1.04 08/29/2022   CREATININE 0.99 06/11/2022  He is on Entresto.  -+ HL; last set of lipids: Lab Results  Component Value Date   CHOL 143 08/29/2022   HDL 38.20 (L) 08/29/2022   LDLCALC 86 08/29/2022   LDLDIRECT 183.6  08/02/2011   TRIG 94.0 08/29/2022   CHOLHDL 4 08/29/2022  On Lipitor 80 mg daily and Zetia 10 mg daily.  - last eye exam was 05/09/2022: No DR.  - no numbness and tingling in his feet.  Last foot exam 06/06/2022  He also has a history of HTN, GERD, sleep apnea, asthmatic bronchitis.  He has a Doctor, general practice.  ROS: + see HPI  Past Medical History:  Diagnosis Date   ACHILLES TENDINITIS, BILATERAL 10/16/2008   ALLERGIC RHINITIS 08/13/2009   CARDIOMYOPATHY 04/21/2009   CHF (congestive heart failure) ([facility])    CONGESTIVE HEART FAILURE 12/04/2007   CORONARY ARTERY DISEASE 12/04/2007   GERD (gastroesophageal reflux disease) 11/01/2011   HYPERLIPIDEMIA 03/12/2007   HYPERTENSION 03/12/2007   OBESITY 04/21/2009   Plantar fasciitis, bilateral 11/01/2011   SLEEP APNEA 03/12/2007   Past Surgical History:  Procedure Laterality Date   CARDIAC CATHETERIZATION N/A 12/19/2014   Procedure: Left Heart Cath and Coronary Angiography;  Surgeon: Lyn Records, MD;  Location: [facility];  Service: Cardiovascular;  Laterality: N/A;   COLONOSCOPY WITH PROPOFOL N/A 11/19/2020   Procedure: COLONOSCOPY WITH PROPOFOL;  Surgeon: Rachael Fee, MD;  [facility];  Service: Endoscopy;  Laterality: N/A;   NO PAST SURGERIES     Social History  Socioeconomic History   Marital status: Married    Spouse name: Not on file   Number of children: 3   Years of education: Not on file   Highest education level: Not on file  Occupational History   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[employer]    Employer: [employer]  Tobacco Use   Smoking status: Never   Smokeless tobacco: Never   Tobacco comments:    never  Vaping Use   Vaping Use: Never used  Substance and Sexual Activity   Alcohol use: No   Drug use: No   Sexual activity: Not on file  Other Topics Concern   Not on file  Social History Narrative   Not on file   Social Determinants of Health   Financial Resource  Strain: Not on file  Food Insecurity: Not on file  Transportation Needs: Not on file  Physical Activity: Not on file  Stress: Not on file  Social Connections: Not on file  Intimate Partner Violence: Not on file   Current Outpatient Medications on File Prior to Visit  Medication Sig Dispense Refill   albuterol (VENTOLIN HFA) 108 (90 Base) MCG/ACT inhaler Inhale 2 puffs into the lungs every 6 (six) hours as needed for wheezing. 6.7 g 11   amLODipine (NORVASC) 10 MG tablet TAKE 1 TABLET BY MOUTH ONCE DAILY . APPOINTMENT REQUIRED FOR FUTURE REFILLS 90 tablet 3   aspirin EC 81 MG tablet Take 81 mg by mouth daily.     atorvastatin (LIPITOR) 80 MG tablet Take 1 tablet (80 mg total) by mouth daily. 90 tablet 2   Blood Glucose Monitoring Suppl (FREESTYLE LITE) DEVI 1 Device by Does not apply route daily. 100 each 3   Blood Glucose Monitoring Suppl (FREESTYLE LITE) w/Device KIT Use daily as directed to test blood glucose. 1 kit 3   budesonide-formoterol (SYMBICORT) 160-4.5 MCG/ACT inhaler INHALE 2 PUFFS BY MOUTH TWICE A DAY 10.2 g 12   carvedilol (COREG) 25 MG tablet Take 1 tablet (25 mg total) by mouth 2 (two) times daily. 180 tablet 3   cetirizine (ZYRTEC) 10 MG tablet Take 10 mg by mouth daily.     Cholecalciferol (VITAMIN D) 50 MCG (2000 UT) tablet Take 2,000 Units by mouth daily.     dapagliflozin propanediol (FARXIGA) 5 MG TABS tablet Take 1 tablet (5 mg total) by mouth daily before breakfast. 90 tablet 3   ezetimibe (ZETIA) 10 MG tablet Take 1 tablet by mouth once daily 90 tablet 3   furosemide (LASIX) 20 MG tablet Take 1 tablet by mouth once daily 90 tablet 2   glucose blood (FREESTYLE LITE) test strip Use 1 strip to check blood glucose once daily as directed. 100 each 12   glucose blood (FREESTYLE TEST STRIPS) test strip 1 each by Other route daily. And lancets 1/day 100 each 12   Lancets (FREESTYLE) lancets Use 1 lancet once daily as directed to test blood glucose. 100 each 12   Lancets  MISC Use as directed twice per day E11.9 200 each 12   metFORMIN (GLUCOPHAGE-XR) 500 MG 24 hr tablet Take 2 tablets (1,000 mg total) by mouth 2 (two) times daily with a meal. 360 tablet 3   Multiple Vitamins-Minerals (MULTIVITAMIN WITH MINERALS) tablet Take 1 tablet by mouth daily.     nitroGLYCERIN (NITROSTAT) 0.4 MG SL tablet Place 1 tablet (0.4 mg total) under the tongue every 5 (five) minutes as needed for chest pain. 25 tablet 3   oxyCODONE-acetaminophen (PERCOCET/ROXICET) 5-325  MG tablet Take 1 tablet by mouth every 6 (six) hours as needed for severe pain. 15 tablet 0   PEG-KCl-NaCl-NaSulf-Na Asc-C (PLENVU) 140 g SOLR Take 1 kit by mouth as directed. 3 each 0   PRESCRIPTION MEDICATION Inhale into the lungs at bedtime. CPAP     promethazine-dextromethorphan (PROMETHAZINE-DM) 6.25-15 MG/5ML syrup Take 5 mLs by mouth at bedtime as needed for cough. 118 mL 0   sacubitril-valsartan (ENTRESTO) 49-51 MG Take 1 tablet by mouth 2 (two) times daily 60 tablet 3   Semaglutide (RYBELSUS) 14 MG TABS Take 1 tablet by mouth once daily 90 tablet 0   spironolactone (ALDACTONE) 25 MG tablet Take 1 tablet by mouth once daily 90 tablet 3   No current facility-administered medications on file prior to visit.   No Known Allergies Family History  Problem Relation Age of Onset   Hypertension Other    Heart attack Mother    Heart disease Mother    Diabetes Mother    Heart attack Father    Heart disease Father    Diabetes Father    Asthma Brother    Diabetes Maternal Grandfather    PE: BP 116/80 (BP Location: Left Arm, Patient Position: Sitting, Cuff Size: Normal)   Pulse 65   Ht  (1.753 m)   Wt (!) 370 lb 3.2 oz (167.9 kg)   SpO2 96%   BMI 54.67 kg/m  Wt Readings from Last 3 Encounters:  10/13/22 (!) 370 lb 3.2 oz (167.9 kg)  08/29/22 (!) 377 lb (171 kg)  08/15/22 (!) 372 lb (168.7 kg)   Constitutional: overweight, in NAD Eyes: no exophthalmos ENT: no thyromegaly, no cervical  lymphadenopathy Cardiovascular: RRR, No MRG, + pitting LE edema B Respiratory: CTA B Musculoskeletal: no deformities Skin:no rashes Neurological: no tremor with outstretched hands  ASSESSMENT: 1. DM2, non-insulin-dependent, uncontrolled, with complications - CAD - CHF - Nonischemic cardiomyopathy  2. HL  3.  Obesity class III  PLAN:  1. Patient with longstanding, uncontrolled, type 2 diabetes, on oral antidiabetic regimen with metformin, SGLT2 inhibitor and GLP-1 receptor agonist, will improved control at last visit, and an HbA1c of 6.4%.  At that time, sugars appears to be better after stopping regular soda and I strongly advised him to continue.  I also advised him to try to get a CGM, but otherwise we did not change the regimen. -He had another HbA1c obtained since last visit, last month, and this was slightly higher, at 6.7%. -At today's visit, his sugars appear to be at goal in the morning but he is not checking later in the day.  I advised him to check some sugars later in the day, especially after dinner.  He was not able to get the CGM but he did not try again this year.  Will send a new prescription to his pharmacy.  If this is not covered, he will need a new glucometer.  I advised him to let me know. -He was off Rybelsus for 1 week and recently restarted it. -For now, we will continue the current regimen. - I suggested to:  Patient Instructions  Please continue: - Metformin ER 2000 mg in am - Farxiga 5 mg before breakfast - Rybelsus 14 mg before breakfast  Please return for another visit in 3-4 months.  - advised to check sugars at different times of the day - 1x a day, rotating check times - advised for yearly eye exams >> he is UTD - return to clinic in 3-4  months  2. HL -Latest lipid panel from 08/2022 was reviewed: LDL above our target of less than 55 due to h/o cardiovascular disease, HDL low: Lab Results  Component Value Date   CHOL 143 08/29/2022   HDL 38.20  (L) 08/29/2022   LDLCALC 86 08/29/2022   LDLDIRECT 183.6 08/02/2011   TRIG 94.0 08/29/2022   CHOLHDL 4 08/29/2022  -He continues Lipitor 80 mg daily and Zetia 10 mg daily without side effects  3.  Obesity class III -continue SGLT 2 inhibitor and GLP-1 receptor agonist which should also help with weight loss -He gained 7 pounds before last visit, previously lost 9 -Since last visit, he lost 5 to 7 pounds  Carlus Pavlov, MD PhD [facility]

## 2022-10-13 NOTE — Patient Instructions (Addendum)
Please continue: - Metformin ER 2000 mg in am - Farxiga 5 mg before breakfast  Restart: - Rybelsus 14 mg before breakfast   Please return for another visit in 3-4 months.

## 2022-10-21 ENCOUNTER — Other Ambulatory Visit (HOSPITAL_COMMUNITY): Payer: Self-pay

## 2022-10-25 ENCOUNTER — Other Ambulatory Visit: Payer: Self-pay | Admitting: Cardiology

## 2022-11-15 ENCOUNTER — Other Ambulatory Visit (HOSPITAL_COMMUNITY): Payer: Self-pay

## 2022-11-17 ENCOUNTER — Other Ambulatory Visit (HOSPITAL_COMMUNITY): Payer: Self-pay

## 2022-12-09 NOTE — Telephone Encounter (Signed)
Closing encounter.   Will need to complete new VOB and PA if pt wishes to proceed with knee injections in the future.

## 2022-12-21 NOTE — Progress Notes (Signed)
HPI: FU hypertension and nonischemic cardiomyopathy. Patient had a previous nonischemic cardiomyopathy which improved on fu echos. However he was seen for recurrent chest pain. Nuclear study June 2016 showed an ejection fraction of 37%. There is an old inferior/inferior lateral infarct with peri-infarct ischemia. Cardiac catheterization July 2016 showed a 90% first posterior lateral branch, 90% distal LAD and 50% proximal RCA. Medical therapy recommended. Most recent echocardiogram October 2023 showed ejection fraction 45 to 50%, mild left ventricular hypertrophy, mild right ventricular enlargement and no significant valvular abnormality.  Since last seen, he has had 2 occasions of dyspnea attributed to asthma.  This improved with inhaler and steroids.  He otherwise denies dyspnea on exertion, orthopnea, PND, chest pain or syncope.  Occasional minimal pedal edema.  Current Outpatient Medications  Medication Sig Dispense Refill   albuterol (VENTOLIN HFA) 108 (90 Base) MCG/ACT inhaler Inhale 2 puffs into the lungs every 6 (six) hours as needed for wheezing. 6.7 g 11   amLODipine (NORVASC) 10 MG tablet TAKE 1 TABLET BY MOUTH ONCE DAILY . APPOINTMENT REQUIRED FOR FUTURE REFILLS 90 tablet 3   aspirin EC 81 MG tablet Take 81 mg by mouth daily.     atorvastatin (LIPITOR) 80 MG tablet Take 1 tablet (80 mg total) by mouth daily. 90 tablet 2   Blood Glucose Monitoring Suppl (FREESTYLE LITE) DEVI 1 Device by Does not apply route daily. 100 each 3   Blood Glucose Monitoring Suppl (FREESTYLE LITE) w/Device KIT Use daily as directed to test blood glucose. 1 kit 3   budesonide-formoterol (SYMBICORT) 160-4.5 MCG/ACT inhaler INHALE 2 PUFFS BY MOUTH TWICE A DAY 10.2 g 12   carvedilol (COREG) 25 MG tablet Take 1 tablet by mouth twice daily 180 tablet 0   cetirizine (ZYRTEC) 10 MG tablet Take 10 mg by mouth daily.     Cholecalciferol (VITAMIN D) 50 MCG (2000 UT) tablet Take 2,000 Units by mouth daily.      Continuous Glucose Sensor (FREESTYLE LIBRE 3 SENSOR) MISC Use sensor to check blood sugar and change every 14 (fourteen) days. 6 each 3   dapagliflozin propanediol (FARXIGA) 5 MG TABS tablet Take 1 tablet (5 mg total) by mouth daily before breakfast. 90 tablet 3   ezetimibe (ZETIA) 10 MG tablet Take 1 tablet by mouth once daily 90 tablet 3   furosemide (LASIX) 20 MG tablet Take 1 tablet by mouth once daily 90 tablet 2   glucose blood (FREESTYLE LITE) test strip Use 1 strip to check blood glucose once daily as directed. 100 each 12   glucose blood (FREESTYLE TEST STRIPS) test strip 1 each by Other route daily. And lancets 1/day 100 each 12   Lancets (FREESTYLE) lancets Use 1 lancet once daily as directed to test blood glucose. 100 each 12   Lancets MISC Use as directed twice per day E11.9 200 each 12   metFORMIN (GLUCOPHAGE-XR) 500 MG 24 hr tablet Take 2 tablets (1,000 mg total) by mouth 2 (two) times daily with a meal. 360 tablet 3   Multiple Vitamins-Minerals (MULTIVITAMIN WITH MINERALS) tablet Take 1 tablet by mouth daily.     nitroGLYCERIN (NITROSTAT) 0.4 MG SL tablet Place 1 tablet (0.4 mg total) under the tongue every 5 (five) minutes as needed for chest pain. 25 tablet 3   oxyCODONE-acetaminophen (PERCOCET/ROXICET) 5-325 MG tablet Take 1 tablet by mouth every 6 (six) hours as needed for severe pain. 15 tablet 0   PEG-KCl-NaCl-NaSulf-Na Asc-C (PLENVU) 140 g SOLR Take 1 kit  by mouth as directed. 3 each 0   PRESCRIPTION MEDICATION Inhale into the lungs at bedtime. CPAP     promethazine-dextromethorphan (PROMETHAZINE-DM) 6.25-15 MG/5ML syrup Take 5 mLs by mouth at bedtime as needed for cough. 118 mL 0   sacubitril-valsartan (ENTRESTO) 49-51 MG Take 1 tablet by mouth 2 (two) times daily 60 tablet 3   Semaglutide (RYBELSUS) 14 MG TABS Take 1 tablet by mouth once daily 90 tablet 0   spironolactone (ALDACTONE) 25 MG tablet Take 1 tablet by mouth once daily 90 tablet 3   No current  facility-administered medications for this visit.     Past Medical History:  Diagnosis Date   ACHILLES TENDINITIS, BILATERAL 10/16/2008   ALLERGIC RHINITIS 08/13/2009   CARDIOMYOPATHY 04/21/2009   CHF (congestive heart failure) (HCC)    CONGESTIVE HEART FAILURE 12/04/2007   CORONARY ARTERY DISEASE 12/04/2007   GERD (gastroesophageal reflux disease) 11/01/2011   HYPERLIPIDEMIA 03/12/2007   HYPERTENSION 03/12/2007   OBESITY 04/21/2009   Plantar fasciitis, bilateral 11/01/2011   SLEEP APNEA 03/12/2007    Past Surgical History:  Procedure Laterality Date   CARDIAC CATHETERIZATION N/A 12/19/2014   Procedure: Left Heart Cath and Coronary Angiography;  Surgeon: Lyn Records, MD;  Location: [facility] INVASIVE CV LAB;  Service: Cardiovascular;  Laterality: N/A;   COLONOSCOPY WITH PROPOFOL N/A 11/19/2020   Procedure: COLONOSCOPY WITH PROPOFOL;  Surgeon: Rachael Fee, MD;  Location: [facility] ENDOSCOPY;  Service: Endoscopy;  Laterality: N/A;   NO PAST SURGERIES      Social History   Socioeconomic History   Marital status: Married    Spouse name: Not on file   Number of children: 3   Years of education: Not on file   Highest education level: Not on file  Occupational History   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[employer]    Employer:  [employer]  Tobacco Use   Smoking status: Never   Smokeless tobacco: Never   Tobacco comments:    never  Vaping Use   Vaping status: Never Used  Substance and Sexual Activity   Alcohol use: No   Drug use: No   Sexual activity: Not on file  Other Topics Concern   Not on file  Social History Narrative   Not on file   Social Determinants of Health   Financial Resource Strain: Not on file  Food Insecurity: Not on file  Transportation Needs: Not on file  Physical Activity: Not on file  Stress: Not on file  Social Connections: Not on file  Intimate Partner Violence: Not on file    Family History  Problem Relation Age of Onset   Hypertension Other     Heart attack Mother    Heart disease Mother    Diabetes Mother    Heart attack Father    Heart disease Father    Diabetes Father    Asthma Brother    Diabetes Maternal Grandfather     ROS: no fevers or chills, productive cough, hemoptysis, dysphasia, odynophagia, melena, hematochezia, dysuria, hematuria, rash, seizure activity, orthopnea, PND, pedal edema, claudication. Remaining systems are negative.  Physical Exam: Well-developed obese in no acute distress.  Skin is warm and dry.  HEENT is normal.  Neck is supple.  Chest is clear to auscultation with normal expansion.  Cardiovascular exam is regular rate and rhythm.  Abdominal exam nontender or distended. No masses palpated. Extremities show no edema. neuro grossly intact  EKG Interpretation Date/Time:  Monday January 02 2023 14:01:42 EDT Ventricular Rate:  84 PR Interval:  190 QRS Duration:  84 QT Interval:  386 QTC Calculation: 456 R Axis:   25  Text Interpretation: Sinus rhythm with occasional Premature ventricular complexes When compared with ECG of 09-Jul-2017 15:20, PREVIOUS ECG IS PRESENT Confirmed by Olga Millers ([id number]) on 01/02/2023 2:27:17 PM     A/P  1 nonischemic cardiomyopathy-most recent echocardiogram showed mildly reduced LV function.  However he is describing 2 episodes of dyspnea likely secondary to asthma.  Will repeat echocardiogram to reassess LV function.  Continue carvedilol.  Change Entresto to 97/103 twice daily.  Continue spironolactone and Farxiga.  Check potassium and renal function 1 week.  2 hypertension-blood pressure controlled.  Given that I am increasing Entresto we will discontinue amlodipine.  3 chronic combined systolic/diastolic congestive heart failure-patient remains euvolemic though exam is difficult.  Continue Lasix and spironolactone at present dose.  4 coronary artery disease-continue aspirin and statin.  5 hyperlipidemia-continue statin.  6 morbid obesity-we discussed  the importance of weight loss.  Olga Millers, MD

## 2022-12-22 DIAGNOSIS — J029 Acute pharyngitis, unspecified: Secondary | ICD-10-CM | POA: Diagnosis not present

## 2022-12-22 DIAGNOSIS — R051 Acute cough: Secondary | ICD-10-CM | POA: Diagnosis not present

## 2022-12-22 DIAGNOSIS — J45901 Unspecified asthma with (acute) exacerbation: Secondary | ICD-10-CM | POA: Diagnosis not present

## 2022-12-22 DIAGNOSIS — R0981 Nasal congestion: Secondary | ICD-10-CM | POA: Diagnosis not present

## 2023-01-02 ENCOUNTER — Ambulatory Visit: Payer: Federal, State, Local not specified - PPO | Attending: Cardiology | Admitting: Cardiology

## 2023-01-02 ENCOUNTER — Encounter: Payer: Self-pay | Admitting: Cardiology

## 2023-01-02 ENCOUNTER — Other Ambulatory Visit (HOSPITAL_COMMUNITY): Payer: Self-pay

## 2023-01-02 VITALS — BP 124/72 | HR 84 | Ht 69.0 in | Wt 367.8 lb

## 2023-01-02 DIAGNOSIS — I428 Other cardiomyopathies: Secondary | ICD-10-CM

## 2023-01-02 DIAGNOSIS — I509 Heart failure, unspecified: Secondary | ICD-10-CM | POA: Diagnosis not present

## 2023-01-02 DIAGNOSIS — I10 Essential (primary) hypertension: Secondary | ICD-10-CM

## 2023-01-02 DIAGNOSIS — E785 Hyperlipidemia, unspecified: Secondary | ICD-10-CM

## 2023-01-02 DIAGNOSIS — I2510 Atherosclerotic heart disease of native coronary artery without angina pectoris: Secondary | ICD-10-CM

## 2023-01-02 MED ORDER — SACUBITRIL-VALSARTAN 97-103 MG PO TABS
1.0000 | ORAL_TABLET | Freq: Two times a day (BID) | ORAL | 3 refills | Status: DC
  Filled 2023-01-02: qty 180, 90d supply, fill #0
  Filled 2023-04-23: qty 180, 90d supply, fill #1
  Filled 2023-06-01 – 2023-08-26 (×2): qty 180, 90d supply, fill #2

## 2023-01-02 NOTE — Patient Instructions (Signed)
Medication Instructions:   STOP AMLODIPINE  INCREASE ENTRESTO TO 97/103 MG TWICE DAILY= 2 OF THE 49/52 MG TABLETS TWICE DAILY  *If you need a refill on your cardiac medications before your next appointment, please call your pharmacy*   Lab Work:  Your physician recommends that you return for lab work in: ONE WEEK -DO NOT NEED TO FAST  If you have labs (blood work) drawn today and your tests are completely normal, you will receive your results only by: MyChart Message (if you have MyChart) OR A paper copy in the mail If you have any lab test that is abnormal or we need to change your treatment, we will call you to review the results.   Testing/Procedures:  Your physician has requested that you have an echocardiogram. Echocardiography is a painless test that uses sound waves to create images of your heart. It provides your doctor with information about the size and shape of your heart and how well your heart's chambers and valves are working. This procedure takes approximately one hour. There are no restrictions for this procedure. Please do NOT wear cologne, perfume, aftershave, or lotions (deodorant is allowed). Please arrive 15 minutes prior to your appointment time. [address]   Follow-Up: At [facility], you and your health needs are our priority.  As part of our continuing mission to provide you with exceptional heart care, we have created designated Provider Care Teams.  These Care Teams include your primary Cardiologist (physician) and Advanced Practice Providers (APPs -  Physician Assistants and Nurse Practitioners) who all work together to provide you with the care you need, when you need it.  We recommend signing up for the patient portal called "MyChart".  Sign up information is provided on this After Visit Summary.  MyChart is used to connect with patients for Virtual Visits (Telemedicine).  Patients are able to view lab/test results, encounter notes,  upcoming appointments, etc.  Non-urgent messages can be sent to your provider as well.   To learn more about what you can do with MyChart, go to [email or web address].    Your next appointment:   6 month(s)  Provider:   Olga Millers, MD

## 2023-01-05 ENCOUNTER — Other Ambulatory Visit (HOSPITAL_COMMUNITY): Payer: Self-pay

## 2023-01-18 ENCOUNTER — Ambulatory Visit (HOSPITAL_COMMUNITY): Payer: Federal, State, Local not specified - PPO | Attending: Cardiology

## 2023-01-18 DIAGNOSIS — I428 Other cardiomyopathies: Secondary | ICD-10-CM | POA: Insufficient documentation

## 2023-01-18 DIAGNOSIS — I509 Heart failure, unspecified: Secondary | ICD-10-CM | POA: Diagnosis not present

## 2023-01-18 LAB — ECHOCARDIOGRAM COMPLETE
Area-P 1/2: 4.63 cm2
S' Lateral: 3.7 cm

## 2023-01-24 ENCOUNTER — Encounter: Payer: Self-pay | Admitting: *Deleted

## 2023-01-28 NOTE — Progress Notes (Unsigned)
HPI M never smoker followed for bronchitis, OSA,, allergic rhinitis complicated by CHF, GERD, morbid obesity Unattended home sleep study 08/26/13- AHI 17/ hr, weight 393 lbs ---------------------------------------------------------------------------   01/24/22-  46 yoM never smoker followed for bronchitis, OSA,, allergic Rhinitis, Asthmatic Bronchitis, complicated by CHF/CM, GERD, morbid Obesity, DM2, Cardiomyopathy, CHF, CAD, HTN,  -Albuterol hfa, Symbicort 160,  CPAP auto 8-20/ [employer] Download- compliance- 53%, AHI 0.9/ hr Body weight today-382 lbs Covid vax-3 [employer] Reports pneumonia on trip to [place] last month. Treated at [facility]- reports CXR showed R pneumonia. Got injection, Zpak and ?prednisone. Feels resolved and back to using CPAP. He asks about pneumonia vaccine- had pneumovax in 2016. Heart stable.  01/30/23-46 yoM never smoker followed for bronchitis, OSA,, allergic Rhinitis, Asthmatic Bronchitis, complicated by CHF/CM, GERD, morbid Obesity, DM2, Cardiomyopathy, CHF, CAD, HTN,  -Albuterol hfa, Symbicort 160,  CPAP auto 8-20/ [employer] Download- compliance- 63%, AHI 1.3/ hr Body weight today-368 lbs Patient is well. No complaints  Download reviewed. Doing well with CPAP, but has been having dry mouth and thinks mouth is opening despite FFM. Will try chin strap (has beard). Had asthma flare after cruise. Urgent Care in [place] gave prednisone taper and ?different rescue inhaler.Feels well now.  ROS-see HPI   + = positive Constitutional:    weight loss, night sweats, fevers, chills,+fatigue, lassitude. HEENT:    headaches, difficulty swallowing, tooth/dental problems, sore throat,       sneezing, itching, ear ache, nasal congestion, post nasal drip, snoring CV:    chest pain, orthopnea, PND, swelling in lower extremities, anasarca,                                                      dizziness, palpitations Resp:   +shortness of breath with exertion or at rest.                 productive cough,   non-productive cough, coughing up of blood.              change in color of mucus.  wheezing.   Skin:    rash or lesions. GI:  No-   heartburn, indigestion, abdominal pain, nausea, vomiting,  GU:  MS:   joint pain, stiffness,  Neuro-     nothing unusual Psych:  change in mood or affect.  depression or anxiety.   memory loss.  OBJ- Physical Exam General- Alert, Oriented, Affect-appropriate, Distress- none acute, + morbid obesity Skin- rash-none, lesions- none, excoriation- none Lymphadenopathy- none Head- atraumatic            Eyes- Gross vision intact, PERRLA, conjunctivae and secretions clear            Ears- Hearing, canals-normal            Nose- Clear, no-Septal dev, mucus, polyps, erosion, perforation             Throat- Mallampati IV , mucosa clear , drainage- none, tonsils- atrophic Neck- flexible , trachea midline, no stridor , thyroid nl, carotid no bruit Chest - symmetrical excursion , unlabored           Heart/CV- RRR , no murmur , no gallop  , no rub, nl s1 s2                           -  JVD- none , edema- none, stasis changes- none, varices- none           Lung- +clear, wheeze- none, cough- none , dullness-none, rub- none           Chest wall-  Abd-  Br/ Gen/ Rectal- Not done, not indicated Extrem- cyanosis- none, clubbing, none, atrophy- none, strength- nl Neuro- grossly intact to observation    .

## 2023-01-29 ENCOUNTER — Other Ambulatory Visit: Payer: Self-pay | Admitting: Cardiology

## 2023-01-29 ENCOUNTER — Other Ambulatory Visit (HOSPITAL_COMMUNITY): Payer: Self-pay

## 2023-01-30 ENCOUNTER — Ambulatory Visit (INDEPENDENT_AMBULATORY_CARE_PROVIDER_SITE_OTHER): Payer: Federal, State, Local not specified - PPO | Admitting: Internal Medicine

## 2023-01-30 ENCOUNTER — Other Ambulatory Visit (HOSPITAL_COMMUNITY): Payer: Self-pay

## 2023-01-30 ENCOUNTER — Other Ambulatory Visit: Payer: Self-pay

## 2023-01-30 ENCOUNTER — Encounter: Payer: Self-pay | Admitting: Internal Medicine

## 2023-01-30 VITALS — BP 130/70 | HR 64 | Ht 69.0 in | Wt 368.6 lb

## 2023-01-30 DIAGNOSIS — J4521 Mild intermittent asthma with (acute) exacerbation: Secondary | ICD-10-CM | POA: Diagnosis not present

## 2023-01-30 DIAGNOSIS — G4733 Obstructive sleep apnea (adult) (pediatric): Secondary | ICD-10-CM

## 2023-01-30 NOTE — Assessment & Plan Note (Signed)
Recent exacerbation resolved Plan- continue Symbicort and albuterol hfa

## 2023-01-30 NOTE — Assessment & Plan Note (Signed)
Benefits from CPAP with good control. Discussed compliance goals. Plan-- add chin strap

## 2023-01-30 NOTE — Patient Instructions (Signed)
Order- DME [employer]- please provide chin strap. Continue auto 8-20  Please call if your inhalers aren't helping enough or if you have CPAP problems

## 2023-02-02 ENCOUNTER — Encounter (INDEPENDENT_AMBULATORY_CARE_PROVIDER_SITE_OTHER): Payer: Self-pay

## 2023-02-13 ENCOUNTER — Ambulatory Visit: Payer: Federal, State, Local not specified - PPO | Admitting: Internal Medicine

## 2023-02-13 ENCOUNTER — Encounter: Payer: Self-pay | Admitting: Internal Medicine

## 2023-02-13 VITALS — BP 118/74 | HR 76 | Wt 369.0 lb

## 2023-02-13 DIAGNOSIS — E785 Hyperlipidemia, unspecified: Secondary | ICD-10-CM | POA: Diagnosis not present

## 2023-02-13 DIAGNOSIS — E1165 Type 2 diabetes mellitus with hyperglycemia: Secondary | ICD-10-CM

## 2023-02-13 DIAGNOSIS — Z7984 Long term (current) use of oral hypoglycemic drugs: Secondary | ICD-10-CM | POA: Diagnosis not present

## 2023-02-13 DIAGNOSIS — E1159 Type 2 diabetes mellitus with other circulatory complications: Secondary | ICD-10-CM | POA: Diagnosis not present

## 2023-02-13 LAB — POCT GLYCOSYLATED HEMOGLOBIN (HGB A1C): Hemoglobin A1C: 6.4 % — AB (ref 4.0–5.6)

## 2023-02-13 MED ORDER — ONETOUCH VERIO REFLECT W/DEVICE KIT
PACK | 0 refills | Status: AC
  Filled 2023-02-13: qty 1, 30d supply, fill #0

## 2023-02-13 MED ORDER — ONETOUCH DELICA LANCETS 33G MISC
3 refills | Status: AC
  Filled 2023-02-13: qty 100, 90d supply, fill #0

## 2023-02-13 MED ORDER — FREESTYLE LIBRE 3 SENSOR MISC
1.0000 | 3 refills | Status: DC
  Filled 2023-02-13: qty 6, 84d supply, fill #0

## 2023-02-13 MED ORDER — ONETOUCH VERIO VI STRP
ORAL_STRIP | 3 refills | Status: DC
  Filled 2023-02-13: qty 100, 90d supply, fill #0

## 2023-02-13 NOTE — Progress Notes (Signed)
Patient ID: Jefrey Maddaloni, male   DOB: [birth year], 47 y.o.   MRN: [id number]  HPI: Guss Villaruz is a 47 y.o.-year-old male, returning for follow-up for DM2, dx in 2013, non-insulin-dependent, uncontrolled, with complications (CAD, CHF, mild CKD). Pt. previously saw Dr. Everardo All, but last visit with me 4 months ago.  Interim history: No increased urination, blurry vision, nausea, chest pain. He switched from regular to diet sodas previously.  He also stopped drinking sweet tea before last visit.  Reviewed HbA1c: Lab Results  Component Value Date   HGBA1C 6.7 (H) 08/29/2022   HGBA1C 6.3 (A) 06/06/2022   HGBA1C 7.4 (A) 02/14/2022   HGBA1C 6.9 (A) 10/18/2021   HGBA1C 7.2 (A) 07/19/2021   HGBA1C 6.2 (A) 04/12/2021   HGBA1C 6.1 (A) 01/11/2021   HGBA1C 6.6 (H) 01/05/2021   HGBA1C 7.1 (A) 11/06/2020   HGBA1C 10.1 (A) 09/25/2020   Pt is on a regimen of: - Metformin ER 2000 in am - Farxiga 5 mg before breakfast - Rybelsus 14 mg before breakfast He had diarrhea with regular metformin.  Pt checks his sugars 0-1x a day and they are: - am: 107-130 >> 90-116  >> 90-102 >> 105-115 - 2h after b'fast: n/c - before lunch: n/c - 2h after lunch: n/c >> 105 - before dinner: n/c - 2h after dinner: n/c - bedtime: n/c - nighttime: n/c Lowest sugar was 90 >> 90 >> 105. Highest sugar was 130 >> 116 >> 115.  Glucometer: Freestyle Lite  At last visit, he was drinking 1 Pepsi a day.  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 Water caused muscle cramps.  I strongly advised him about stopping sweet drinks.  - + Mild CKD, last BUN/creatinine:  Lab Results  Component Value Date   BUN 14 01/18/2023   BUN 17 08/29/2022   CREATININE 1.02 01/18/2023   CREATININE 1.04 08/29/2022  He is on Entresto.  -+ HL; last set of lipids: Lab Results  Component Value Date   CHOL 143 08/29/2022   HDL 38.20 (L) 08/29/2022   LDLCALC 86 08/29/2022   LDLDIRECT 183.6 08/02/2011   TRIG 94.0  08/29/2022   CHOLHDL 4 08/29/2022  On Lipitor 80 mg daily and Zetia 10 mg daily.  - last eye exam was 05/09/2022: No DR.  - no numbness and tingling in his feet.  Last foot exam 06/06/2022  He also has a history of HTN, GERD, sleep apnea, asthmatic bronchitis.  He has a Doctor, general practice.  ROS: + see HPI  Past Medical History:  Diagnosis Date   ACHILLES TENDINITIS, BILATERAL 10/16/2008   ALLERGIC RHINITIS 08/13/2009   CARDIOMYOPATHY 04/21/2009   CHF (congestive heart failure) (HCC)    CONGESTIVE HEART FAILURE 12/04/2007   CORONARY ARTERY DISEASE 12/04/2007   GERD (gastroesophageal reflux disease) 11/01/2011   HYPERLIPIDEMIA 03/12/2007   HYPERTENSION 03/12/2007   OBESITY 04/21/2009   Plantar fasciitis, bilateral 11/01/2011   SLEEP APNEA 03/12/2007   Past Surgical History:  Procedure Laterality Date   CARDIAC CATHETERIZATION N/A 12/19/2014   Procedure: Left Heart Cath and Coronary Angiography;  Surgeon: Lyn Records, MD;  Location: [facility] INVASIVE CV LAB;  Service: Cardiovascular;  Laterality: N/A;   COLONOSCOPY WITH PROPOFOL N/A 11/19/2020   Procedure: COLONOSCOPY WITH PROPOFOL;  Surgeon: Rachael Fee, MD;  Location: [facility] ENDOSCOPY;  Service: Endoscopy;  Laterality: N/A;   NO PAST SURGERIES     Social History   Socioeconomic History   Marital  status: Married    Spouse name: Not on file   Number of children: 3   Years of education: Not on file   Highest education level: Not on file  Occupational History   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[employer]    Employer: [employer]  Tobacco Use   Smoking status: Never   Smokeless tobacco: Never   Tobacco comments:    never  Vaping Use   Vaping status: Never Used  Substance and Sexual Activity   Alcohol use: No   Drug use: No   Sexual activity: Not on file  Other Topics Concern   Not on file  Social History Narrative   Not on file   Social Determinants of Health   Financial Resource Strain: Not on file   Food Insecurity: Not on file  Transportation Needs: Not on file  Physical Activity: Not on file  Stress: Not on file  Social Connections: Not on file  Intimate Partner Violence: Not on file   Current Outpatient Medications on File Prior to Visit  Medication Sig Dispense Refill   albuterol (VENTOLIN HFA) 108 (90 Base) MCG/ACT inhaler Inhale 2 puffs into the lungs every 6 (six) hours as needed for wheezing. 6.7 g 11   aspirin EC 81 MG tablet Take 81 mg by mouth daily.     atorvastatin (LIPITOR) 80 MG tablet Take 1 tablet (80 mg total) by mouth daily. 90 tablet 2   Blood Glucose Monitoring Suppl (FREESTYLE LITE) DEVI 1 Device by Does not apply route daily. 100 each 3   Blood Glucose Monitoring Suppl (FREESTYLE LITE) w/Device KIT Use daily as directed to test blood glucose. 1 kit 3   budesonide-formoterol (SYMBICORT) 160-4.5 MCG/ACT inhaler INHALE 2 PUFFS BY MOUTH TWICE A DAY 10.2 g 12   carvedilol (COREG) 25 MG tablet Take 1 tablet (25 mg total) by mouth 2 (two) times daily with a meal. 180 tablet 3   cetirizine (ZYRTEC) 10 MG tablet Take 10 mg by mouth daily.     Cholecalciferol (VITAMIN D) 50 MCG (2000 UT) tablet Take 2,000 Units by mouth daily.     Continuous Glucose Sensor (FREESTYLE LIBRE 3 SENSOR) MISC Use sensor to check blood sugar and change every 14 (fourteen) days. 6 each 3   dapagliflozin propanediol (FARXIGA) 5 MG TABS tablet Take 1 tablet (5 mg total) by mouth daily before breakfast. 90 tablet 3   ezetimibe (ZETIA) 10 MG tablet Take 1 tablet by mouth once daily 90 tablet 3   furosemide (LASIX) 20 MG tablet Take 1 tablet by mouth once daily 90 tablet 2   glucose blood (FREESTYLE LITE) test strip Use 1 strip to check blood glucose once daily as directed. 100 each 12   glucose blood (FREESTYLE TEST STRIPS) test strip 1 each by Other route daily. And lancets 1/day 100 each 12   Lancets (FREESTYLE) lancets Use 1 lancet once daily as directed to test blood glucose. 100 each 12    Lancets MISC Use as directed twice per day E11.9 200 each 12   metFORMIN (GLUCOPHAGE-XR) 500 MG 24 hr tablet Take 2 tablets (1,000 mg total) by mouth 2 (two) times daily with a meal. 360 tablet 3   Multiple Vitamins-Minerals (MULTIVITAMIN WITH MINERALS) tablet Take 1 tablet by mouth daily.     nitroGLYCERIN (NITROSTAT) 0.4 MG SL tablet Place 1 tablet (0.4 mg total) under the tongue every 5 (five) minutes as needed for chest pain. 25 tablet 3   oxyCODONE-acetaminophen (PERCOCET/ROXICET) 5-325  MG tablet Take 1 tablet by mouth every 6 (six) hours as needed for severe pain. 15 tablet 0   PEG-KCl-NaCl-NaSulf-Na Asc-C (PLENVU) 140 g SOLR Take 1 kit by mouth as directed. 3 each 0   PRESCRIPTION MEDICATION Inhale into the lungs at bedtime. CPAP     promethazine-dextromethorphan (PROMETHAZINE-DM) 6.25-15 MG/5ML syrup Take 5 mLs by mouth at bedtime as needed for cough. 118 mL 0   sacubitril-valsartan (ENTRESTO) 97-103 MG Take 1 tablet by mouth 2 times daily. 180 tablet 3   Semaglutide (RYBELSUS) 14 MG TABS Take 1 tablet by mouth once daily 90 tablet 0   spironolactone (ALDACTONE) 25 MG tablet Take 1 tablet by mouth once daily 90 tablet 3   No current facility-administered medications on file prior to visit.   No Known Allergies Family History  Problem Relation Age of Onset   Hypertension Other    Heart attack Mother    Heart disease Mother    Diabetes Mother    Heart attack Father    Heart disease Father    Diabetes Father    Asthma Brother    Diabetes Maternal Grandfather    PE: BP 118/74 (BP Location: Left Arm, Patient Position: Sitting, Cuff Size: Large)   Pulse 76   SpO2 99%  Wt Readings from Last 3 Encounters:  02/13/23 (!) 369 lb (167.4 kg)  01/30/23 (!) 368 lb 9.6 oz (167.2 kg)  01/02/23 (!) 367 lb 12.8 oz (166.8 kg)   Constitutional: overweight, in NAD Eyes: no exophthalmos ENT: no thyromegaly, no cervical lymphadenopathy Cardiovascular: RRR, No MRG, + pitting LE edema  B Respiratory: CTA B Musculoskeletal: no deformities Skin:no rashes Neurological: no tremor with outstretched hands  ASSESSMENT: 1. DM2, non-insulin-dependent, uncontrolled, with complications - CAD - CHF - Nonischemic cardiomyopathy  2. HL  3.  Obesity class III  PLAN:  1. Patient with longstanding, uncontrolled, type 2 diabetes, on oral antidiabetic regimen with metformin, SGLT2 inhibitor and GLP-1 receptor agonist, with slightly worse control at last visit, when HbA1c returned 6.7%, increased from 6.4%.  Sugars appeared to be at goal in the morning but he was not checking later in the day and I advised him to try to do so, rotating check times.  He was not able to get a CGM and I sent a new prescription for this to his pharmacy.  We did not change his regimen. He did not get the CGM. -At today's visit, sugars are at goal, but slightly higher than before.  He is still mostly checks in the morning and I again recommended to also check later in the day.  I sent another prescription for the freestyle libre 3 CGM to his pharmacy as he mentioned that he was not called about this after last visit.  -At today's visit, he inquires about Ozempic.  However, he just refilled Rybelsus 14 mg dose.  We discussed that the new studies with Rybelsus use a higher dose, 50 mg daily.  For now, my suggestion would was to double up on the dose, therefore, to use 28 mg of Rybelsus daily until he is close to running out.  At that time, we can start him on Ozempic.  He agrees with this plan. - I suggested to:  Patient Instructions  Please continue: - Metformin ER 2000 mg in am - Farxiga 5 mg before breakfast  Increase: - Rybelsus 14 x2 mg before breakfast  Let me know when you are close to running out of Rybelsus so we can start  Ozempic.   Please return for another visit in 3-4 months.  - we checked his HbA1c: 6.4% (lower) - advised to check sugars at different times of the day - 1x a day, rotating check  times - advised for yearly eye exams >> he is UTD - return to clinic in 3-4 months  2. HL -Latest lipid panel from 08/2022 reviewed: LDL above our target of less than 55, HDL low: Lab Results  Component Value Date   CHOL 143 08/29/2022   HDL 38.20 (L) 08/29/2022   LDLCALC 86 08/29/2022   LDLDIRECT 183.6 08/02/2011   TRIG 94.0 08/29/2022   CHOLHDL 4 08/29/2022  -He continues on Lipitor 80 mg daily and Zetia 10 mg daily without side effects  3.  Obesity class III -continue SGLT 2 inhibitor and GLP-1 receptor agonist which should also help with weight loss.  At today's visit we will increase his GLP-1 receptor agonist per his request. -He lost 5-7 pounds before last visit, and gained 1 pound since last visit  Carlus Pavlov, MD PhD [facility] Endocr[facility]

## 2023-02-13 NOTE — Patient Instructions (Addendum)
Please continue: - Metformin ER 2000 mg in am - Farxiga 5 mg before breakfast  Increase: - Rybelsus 14 x2 mg before breakfast  Let me know when you are close to running out of Rybelsus so we can start Ozempic.   Please return for another visit in 3-4 months.

## 2023-02-14 ENCOUNTER — Other Ambulatory Visit (HOSPITAL_COMMUNITY): Payer: Self-pay

## 2023-02-24 ENCOUNTER — Other Ambulatory Visit (HOSPITAL_COMMUNITY): Payer: Self-pay

## 2023-02-27 ENCOUNTER — Telehealth: Payer: Self-pay

## 2023-02-27 ENCOUNTER — Encounter: Payer: Self-pay | Admitting: Internal Medicine

## 2023-02-27 ENCOUNTER — Ambulatory Visit (INDEPENDENT_AMBULATORY_CARE_PROVIDER_SITE_OTHER): Payer: Federal, State, Local not specified - PPO | Admitting: Internal Medicine

## 2023-02-27 VITALS — HR 82 | Temp 97.6°F | Ht 69.0 in | Wt 361.1 lb

## 2023-02-27 DIAGNOSIS — Z23 Encounter for immunization: Secondary | ICD-10-CM

## 2023-02-27 NOTE — Patient Instructions (Signed)
Pt not seen.

## 2023-02-27 NOTE — Telephone Encounter (Signed)
Pt left before seeing provider due to provider running late. He did get his Flu vaccine

## 2023-02-27 NOTE — Progress Notes (Signed)
Patient ID: Tyler [name], male   DOB: 17-Sep-1975, 47 y.o.   MRN: [id number]    Pt had to leave - not seen

## 2023-03-16 ENCOUNTER — Other Ambulatory Visit (HOSPITAL_COMMUNITY): Payer: Self-pay

## 2023-04-08 ENCOUNTER — Other Ambulatory Visit: Payer: Self-pay | Admitting: Cardiology

## 2023-04-08 DIAGNOSIS — I429 Cardiomyopathy, unspecified: Secondary | ICD-10-CM

## 2023-04-08 DIAGNOSIS — E785 Hyperlipidemia, unspecified: Secondary | ICD-10-CM

## 2023-04-24 ENCOUNTER — Other Ambulatory Visit: Payer: Self-pay

## 2023-04-25 ENCOUNTER — Other Ambulatory Visit (HOSPITAL_COMMUNITY): Payer: Self-pay

## 2023-05-04 ENCOUNTER — Other Ambulatory Visit: Payer: Self-pay | Admitting: Internal Medicine

## 2023-05-04 DIAGNOSIS — E1165 Type 2 diabetes mellitus with hyperglycemia: Secondary | ICD-10-CM

## 2023-05-05 ENCOUNTER — Other Ambulatory Visit: Payer: Self-pay

## 2023-05-05 ENCOUNTER — Other Ambulatory Visit (HOSPITAL_COMMUNITY): Payer: Self-pay

## 2023-05-05 MED ORDER — RYBELSUS 14 MG PO TABS
14.0000 mg | ORAL_TABLET | Freq: Every day | ORAL | 0 refills | Status: DC
Start: 2023-05-05 — End: 2023-06-19
  Filled 2023-05-05: qty 90, 90d supply, fill #0

## 2023-05-05 NOTE — Telephone Encounter (Signed)
Rybelsus refill request complete

## 2023-05-08 ENCOUNTER — Other Ambulatory Visit (HOSPITAL_COMMUNITY): Payer: Self-pay

## 2023-05-11 ENCOUNTER — Other Ambulatory Visit (HOSPITAL_COMMUNITY): Payer: Self-pay

## 2023-05-15 ENCOUNTER — Ambulatory Visit (INDEPENDENT_AMBULATORY_CARE_PROVIDER_SITE_OTHER): Payer: Federal, State, Local not specified - PPO | Admitting: Internal Medicine

## 2023-05-15 ENCOUNTER — Encounter: Payer: Self-pay | Admitting: Internal Medicine

## 2023-05-15 VITALS — BP 122/82 | HR 82 | Ht 69.0 in | Wt 366.0 lb

## 2023-05-15 DIAGNOSIS — R051 Acute cough: Secondary | ICD-10-CM | POA: Diagnosis not present

## 2023-05-15 DIAGNOSIS — J209 Acute bronchitis, unspecified: Secondary | ICD-10-CM | POA: Diagnosis not present

## 2023-05-15 DIAGNOSIS — G4733 Obstructive sleep apnea (adult) (pediatric): Secondary | ICD-10-CM | POA: Diagnosis not present

## 2023-05-15 DIAGNOSIS — J189 Pneumonia, unspecified organism: Secondary | ICD-10-CM | POA: Diagnosis not present

## 2023-05-15 NOTE — Patient Instructions (Addendum)
Order- Flu test- NEG            Covid test- NEG  Take the meds as prescribed from Urgent Care this morning  Stay well hydrated  You are contagious now and for the next 3-4 days at least.  Keep appointment for next August as planned, but let us know if you need help sooner.

## 2023-05-15 NOTE — Progress Notes (Unsigned)
HPI M never smoker followed for bronchitis, OSA,, allergic rhinitis complicated by CHF, GERD, morbid obesity Unattended home sleep study 08/26/13- AHI 17/ hr, weight 393 lbs ---------------------------------------------------------------------------   01/30/23-46 yoM never smoker followed for bronchitis, OSA,, allergic Rhinitis, Asthmatic Bronchitis, complicated by CHF/CM, GERD, morbid Obesity, DM2, Cardiomyopathy, CHF, CAD, HTN,  -Albuterol hfa, Symbicort 160,  CPAP auto 8-20/ [employer] Download- compliance- 63%, AHI 1.3/ hr Body weight today-368 lbs Patient is well. No complaints  Download reviewed. Doing well with CPAP, but has been having dry mouth and thinks mouth is opening despite FFM. Will try chin strap (has beard). Had asthma flare after cruise. Urgent Care in [place] gave prednisone taper and ?different rescue inhaler.Feels well now.  05/15/23- 46 yoM never smoker followed for bronchitis, OSA, allergic Rhinitis, Asthmatic Bronchitis, complicated by CHF/CM, GERD, morbid Obesity, DM2, Cardiomyopathy, CHF, CAD, HTN,  -Albuterol hfa, Symbicort 160,  CPAP auto 8-20/ [employer] Download- compliance- Body weight today-366 lbs Cough started last night. Went to [facility] this morning>afebrile, O2 sat 95%. CXR report from that visit "Infiltrate seen, right lower lobe, left upper lobe, no atelectasis", otw normal.> Zpak, augmentin 875, prednisone taper, promethazine-DM cough syrup.    ROS-see HPI   + = positive Constitutional:    weight loss, night sweats, fevers, chills,+fatigue, lassitude. HEENT:    headaches, difficulty swallowing, tooth/dental problems, sore throat,       sneezing, itching, ear ache, nasal congestion, post nasal drip, snoring CV:    chest pain, orthopnea, PND, swelling in lower extremities, anasarca,                                                      dizziness, palpitations Resp:   +shortness of breath with exertion or at rest.                productive cough,    non-productive cough, coughing up of blood.              change in color of mucus.  wheezing.   Skin:    rash or lesions. GI:  No-   heartburn, indigestion, abdominal pain, nausea, vomiting,  GU:  MS:   joint pain, stiffness,  Neuro-     nothing unusual Psych:  change in mood or affect.  depression or anxiety.   memory loss.  OBJ- Physical Exam General- Alert, Oriented, Affect-appropriate, Distress- none acute, + morbid obesity Skin- rash-none, lesions- none, excoriation- none Lymphadenopathy- none Head- atraumatic            Eyes- Gross vision intact, PERRLA, conjunctivae and secretions clear            Ears- Hearing, canals-normal            Nose- Clear, no-Septal dev, mucus, polyps, erosion, perforation             Throat- Mallampati IV , mucosa clear , drainage- none, tonsils- atrophic Neck- flexible , trachea midline, no stridor , thyroid nl, carotid no bruit Chest - symmetrical excursion , unlabored           Heart/CV- RRR , no murmur , no gallop  , no rub, nl s1 s2                           - JVD- none ,  edema- none, stasis changes- none, varices- none           Lung- +clear, wheeze- none, cough- none , dullness-none, rub- none           Chest wall-  Abd-  Br/ Gen/ Rectal- Not done, not indicated Extrem- cyanosis- none, clubbing, none, atrophy- none, strength- nl Neuro- grossly intact to observation    .

## 2023-06-02 ENCOUNTER — Other Ambulatory Visit (HOSPITAL_COMMUNITY): Payer: Self-pay

## 2023-06-08 ENCOUNTER — Other Ambulatory Visit (HOSPITAL_COMMUNITY): Payer: Self-pay

## 2023-06-12 NOTE — Telephone Encounter (Signed)
Pt archived. We will need to restart the benefits and PA process if pt decides to proceed with Durolane injections.

## 2023-06-19 ENCOUNTER — Other Ambulatory Visit (HOSPITAL_COMMUNITY): Payer: Self-pay

## 2023-06-19 ENCOUNTER — Other Ambulatory Visit: Payer: Self-pay | Admitting: Internal Medicine

## 2023-06-19 ENCOUNTER — Encounter: Payer: Self-pay | Admitting: Internal Medicine

## 2023-06-19 ENCOUNTER — Ambulatory Visit: Payer: Federal, State, Local not specified - PPO | Admitting: Internal Medicine

## 2023-06-19 VITALS — BP 118/70 | HR 68 | Ht 69.0 in | Wt 367.2 lb

## 2023-06-19 DIAGNOSIS — E785 Hyperlipidemia, unspecified: Secondary | ICD-10-CM

## 2023-06-19 DIAGNOSIS — E1165 Type 2 diabetes mellitus with hyperglycemia: Secondary | ICD-10-CM | POA: Diagnosis not present

## 2023-06-19 DIAGNOSIS — Z7984 Long term (current) use of oral hypoglycemic drugs: Secondary | ICD-10-CM

## 2023-06-19 DIAGNOSIS — E1159 Type 2 diabetes mellitus with other circulatory complications: Secondary | ICD-10-CM

## 2023-06-19 LAB — POCT GLYCOSYLATED HEMOGLOBIN (HGB A1C): Hemoglobin A1C: 6.5 % — AB (ref 4.0–5.6)

## 2023-06-19 MED ORDER — FREESTYLE LIBRE 3 PLUS SENSOR MISC
1.0000 | 3 refills | Status: DC
  Filled 2023-06-19: qty 6, 84d supply, fill #0
  Filled 2024-01-03: qty 6, 84d supply, fill #1

## 2023-06-19 MED ORDER — RYBELSUS 14 MG PO TABS
28.0000 mg | ORAL_TABLET | Freq: Every day | ORAL | 3 refills | Status: DC
  Filled 2023-06-19 – 2023-07-04 (×3): qty 180, 90d supply, fill #0

## 2023-06-19 MED ORDER — RYBELSUS 14 MG PO TABS
28.0000 mg | ORAL_TABLET | Freq: Every day | ORAL | 3 refills | Status: DC
Start: 2023-06-19 — End: 2023-06-19
  Filled 2023-06-19: qty 180, 90d supply, fill #0

## 2023-06-19 MED ORDER — FREESTYLE LITE TEST VI STRP
ORAL_STRIP | 5 refills | Status: AC
  Filled 2023-06-19 – 2023-07-04 (×2): qty 100, 90d supply, fill #0

## 2023-06-19 MED ORDER — DAPAGLIFLOZIN PROPANEDIOL 5 MG PO TABS
5.0000 mg | ORAL_TABLET | Freq: Every day | ORAL | 3 refills | Status: DC
  Filled 2023-06-19 – 2023-07-03 (×2): qty 90, 90d supply, fill #0
  Filled 2023-10-09: qty 90, 90d supply, fill #1
  Filled 2024-02-01: qty 90, 90d supply, fill #2

## 2023-06-19 NOTE — Progress Notes (Signed)
Patient ID: Tyler [name], male   DOB: 07-Jun-1976, 47 y.o.   MRN: [id number]  HPI: Tyler [name] is a 47 y.o.-year-old male, returning for follow-up for DM2, dx in 2013, non-insulin-dependent, uncontrolled, with complications (CAD, CHF, mild CKD). Pt. previously saw Dr. Everardo All, but last visit with me 4 months ago.  Interim history: No increased urination, blurry vision, nausea, chest pain.  He did have diarrhea with metformin so he divided the dose to twice a day.  Reviewed HbA1c: Lab Results  Component Value Date   HGBA1C 6.4 (A) 02/13/2023   HGBA1C 6.7 (H) 08/29/2022   HGBA1C 6.3 (A) 06/06/2022   HGBA1C 7.4 (A) 02/14/2022   HGBA1C 6.9 (A) 10/18/2021   HGBA1C 7.2 (A) 07/19/2021   HGBA1C 6.2 (A) 04/12/2021   HGBA1C 6.1 (A) 01/11/2021   HGBA1C 6.6 (H) 01/05/2021   HGBA1C 7.1 (A) 11/06/2020   Pt is on a regimen of: - Metformin ER 2000 in am >> 1000 mg 2x a day - Farxiga 5 mg before breakfast - Rybelsus 14 mg before breakfast He had diarrhea with regular metformin.  Pt checks his sugars 0-1x a day and they are: - am: 107-130 >> 90-116  >> 90-102 >> 105-115 >> 100-120s - 2h after b'fast: n/c - before lunch: n/c - 2h after lunch: n/c >> 105 >> n/c - before dinner: n/c >> 100 - 2h after dinner: n/c - bedtime: n/c - nighttime: n/c  Glucometer: Freestyle Lite  He was previously drinking 1 Pepsi a day.  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  He mentioned that water caused muscle cramps.  I strongly advised him about stopping sweet drinks.  - + Mild CKD, last BUN/creatinine:  Lab Results  Component Value Date   BUN 14 01/18/2023   BUN 17 08/29/2022   CREATININE 1.02 01/18/2023   CREATININE 1.04 08/29/2022   Lab Results  Component Value Date   MICRALBCREAT 0.4 08/29/2022   MICRALBCREAT 0.4 08/18/2021   MICRALBCREAT 0.5 08/05/2020   MICRALBCREAT 0.3 06/22/2018   MICRALBCREAT 0.7 06/15/2017   MICRALBCREAT 1.2 02/11/2014  He is on Entresto.  -+ HL;  last set of lipids: Lab Results  Component Value Date   CHOL 143 08/29/2022   HDL 38.20 (L) 08/29/2022   LDLCALC 86 08/29/2022   LDLDIRECT 183.6 08/02/2011   TRIG 94.0 08/29/2022   CHOLHDL 4 08/29/2022  On Lipitor 80 mg daily and Zetia 10 mg daily.  - last eye exam was 05/09/2022: No DR.  - no numbness and tingling in his feet.  Last foot exam 06/06/2022.  He also has a history of HTN, GERD, sleep apnea, asthmatic bronchitis.  He has a Doctor, general practice.  ROS: + see HPI  Past Medical History:  Diagnosis Date   ACHILLES TENDINITIS, BILATERAL 10/16/2008   ALLERGIC RHINITIS 08/13/2009   CARDIOMYOPATHY 04/21/2009   CHF (congestive heart failure) (HCC)    CONGESTIVE HEART FAILURE 12/04/2007   CORONARY ARTERY DISEASE 12/04/2007   GERD (gastroesophageal reflux disease) 11/01/2011   HYPERLIPIDEMIA 03/12/2007   HYPERTENSION 03/12/2007   OBESITY 04/21/2009   Plantar fasciitis, bilateral 11/01/2011   SLEEP APNEA 03/12/2007   Past Surgical History:  Procedure Laterality Date   CARDIAC CATHETERIZATION N/A 12/19/2014   Procedure: Left Heart Cath and Coronary Angiography;  Surgeon: Lyn Records, MD;  Location: [facility] INVASIVE CV LAB;  Service: Cardiovascular;  Laterality: N/A;   COLONOSCOPY WITH PROPOFOL N/A 11/19/2020   Procedure: COLONOSCOPY WITH PROPOFOL;  Surgeon:  Rachael Fee, MD;  Location: [facility];  Service: Endoscopy;  Laterality: N/A;   NO PAST SURGERIES     Social History   Socioeconomic History   Marital status: Married    Spouse name: Not on file   Number of children: 3   Years of education: Not on file   Highest education level: Not on file  Occupational History   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[employer] [place]    Employer: [employer]  Tobacco Use   Smoking status: Never   Smokeless tobacco: Never   Tobacco comments:    never  Vaping Use   Vaping status: Never Used  Substance and Sexual Activity   Alcohol use: No   Drug use: No   Sexual  activity: Not on file  Other Topics Concern   Not on file  Social History Narrative   Not on file   Social Drivers of Health   Financial Resource Strain: Not on file  Food Insecurity: Not on file  Transportation Needs: Not on file  Physical Activity: Not on file  Stress: Not on file  Social Connections: Not on file  Intimate Partner Violence: Not on file   Current Outpatient Medications on File Prior to Visit  Medication Sig Dispense Refill   albuterol (VENTOLIN HFA) 108 (90 Base) MCG/ACT inhaler Inhale 2 puffs into the lungs every 6 (six) hours as needed for wheezing. 6.7 g 11   amoxicillin-clavulanate (AUGMENTIN) 875-125 MG tablet Take 1 tablet by mouth 2 (two) times daily. (Patient not taking: Reported on 05/15/2023)     aspirin EC 81 MG tablet Take 81 mg by mouth daily.     atorvastatin (LIPITOR) 80 MG tablet Take 1 tablet by mouth once daily 90 tablet 0   azithromycin (ZITHROMAX) 250 MG tablet Take 250 mg by mouth as directed. (Patient not taking: Reported on 05/15/2023)     Blood Glucose Monitoring Suppl (FREESTYLE LITE) w/Device KIT Use daily as directed to test blood glucose. 1 kit 3   Blood Glucose Monitoring Suppl (ONETOUCH VERIO REFLECT) w/Device KIT Use as advised 1 kit 0   budesonide-formoterol (SYMBICORT) 160-4.5 MCG/ACT inhaler INHALE 2 PUFFS BY MOUTH TWICE A DAY 10.2 g 12   carvedilol (COREG) 25 MG tablet Take 1 tablet (25 mg total) by mouth 2 (two) times daily with a meal. 180 tablet 3   cetirizine (ZYRTEC) 10 MG tablet Take 10 mg by mouth daily.     Cholecalciferol (VITAMIN D) 50 MCG (2000 UT) tablet Take 2,000 Units by mouth daily.     Continuous Glucose Sensor (FREESTYLE LIBRE 3 SENSOR) MISC Use sensor to check blood sugar and change every 14 (fourteen) days. 6 each 3   dapagliflozin propanediol (FARXIGA) 5 MG TABS tablet Take 1 tablet (5 mg total) by mouth daily before breakfast. 90 tablet 3   ezetimibe (ZETIA) 10 MG tablet Take 1 tablet by mouth once daily 90  tablet 3   furosemide (LASIX) 20 MG tablet Take 1 tablet by mouth once daily 90 tablet 0   glucose blood (FREESTYLE LITE) test strip Use 1 strip to check blood glucose once daily as directed. 100 each 12   glucose blood (FREESTYLE TEST STRIPS) test strip 1 each by Other route daily. And lancets 1/day 100 each 12   glucose blood (ONETOUCH VERIO) test strip Use as instructed 100 each 3   Lancets (FREESTYLE) lancets Use 1 lancet once daily as directed to test blood glucose. 100 each 12   Lancets MISC  Use as directed twice per day E11.9 200 each 12   metFORMIN (GLUCOPHAGE-XR) 500 MG 24 hr tablet Take 2 tablets (1,000 mg total) by mouth 2 (two) times daily with a meal. 360 tablet 3   Multiple Vitamins-Minerals (MULTIVITAMIN WITH MINERALS) tablet Take 1 tablet by mouth daily.     nitroGLYCERIN (NITROSTAT) 0.4 MG SL tablet Place 1 tablet (0.4 mg total) under the tongue every 5 (five) minutes as needed for chest pain. 25 tablet 3   OneTouch Delica Lancets 33G MISC Use once a day 100 each 3   oxyCODONE-acetaminophen (PERCOCET/ROXICET) 5-325 MG tablet Take 1 tablet by mouth every 6 (six) hours as needed for severe pain. 15 tablet 0   PEG-KCl-NaCl-NaSulf-Na Asc-C (PLENVU) 140 g SOLR Take 1 kit by mouth as directed. 3 each 0   predniSONE (DELTASONE) 10 MG tablet Take by mouth. (Patient not taking: Reported on 05/15/2023)     PRESCRIPTION MEDICATION Inhale into the lungs at bedtime. CPAP     promethazine-dextromethorphan (PROMETHAZINE-DM) 6.25-15 MG/5ML syrup Take 5 mLs by mouth at bedtime as needed for cough. (Patient not taking: Reported on 05/15/2023) 118 mL 0   sacubitril-valsartan (ENTRESTO) 97-103 MG Take 1 tablet by mouth 2 times daily. 180 tablet 3   Semaglutide (RYBELSUS) 14 MG TABS Take 1 tablet by mouth once daily 90 tablet 0   spironolactone (ALDACTONE) 25 MG tablet Take 1 tablet by mouth once daily 90 tablet 0   No current facility-administered medications on file prior to visit.   No Known  Allergies Family History  Problem Relation Age of Onset   Hypertension Other    Heart attack Mother    Heart disease Mother    Diabetes Mother    Heart attack Father    Heart disease Father    Diabetes Father    Asthma Brother    Diabetes Maternal Grandfather    PE: BP 118/70   Pulse 68   Ht 5\' 9"  (1.753 m)   Wt (!) 367 lb 3.2 oz (166.6 kg)   SpO2 98%   BMI 54.23 kg/m  Wt Readings from Last 3 Encounters:  06/19/23 (!) 367 lb 3.2 oz (166.6 kg)  05/15/23 (!) 366 lb (166 kg)  02/27/23 (!) 361 lb 2 oz (163.8 kg)   Constitutional: overweight, in NAD Eyes: no exophthalmos ENT: no thyromegaly, no cervical lymphadenopathy Cardiovascular: RRR, No MRG, + pitting LE edema B Respiratory: CTA B Musculoskeletal: no deformities Skin:no rashes Neurological: no tremor with outstretched hands Diabetic Foot Exam - Simple   Simple Foot Form Diabetic Foot exam was performed with the following findings: Yes 06/19/2023  3:57 PM  Visual Inspection No deformities, no ulcerations, no other skin breakdown bilaterally: Yes Sensation Testing Intact to touch and monofilament testing bilaterally: Yes Pulse Check Posterior Tibialis and Dorsalis pulse intact bilaterally: Yes Comments    ASSESSMENT: 1. DM2, non-insulin-dependent, uncontrolled, with complications - CAD - CHF - Nonischemic cardiomyopathy  2. HL  3.  Obesity class III  PLAN:  1. Patient with longstanding, uncontrolled, type 2 diabetes, on oral antidiabetic regimen with metformin and SGLT2 inhibitor along with p.o. GLP-1 receptor agonist.  At last visit, HbA1c was lower, at 6.4%.  At that time, he was off his freestyle libre 3 CGM and I sent another prescription to his pharmacy.  He inquired about Ozempic.  However, he just refilled the 14 mg Rybelsus dose and I advised him to try to double up on the Rybelsus dose and then, when he runs out  of the medication to let me know so I could call in Ozempic. -At today's visit, his  diabetes control remains at goal.  His HbA1c is only slightly higher than before (see below), despite the holidays.  He has no side effects from taking Rybelsus at the double dose, 28 mg daily, but he splits the dose to 1 tablet before breakfast and 1 at bedtime.  I advised him to move the entire dose before breakfast.  He would prefer to continue with this rather than switching to Ozempic for now.  He split his metformin recently due to diarrhea.  We can continue the current dose, since his diarrhea resolved. - I suggested to:  Patient Instructions  Please continue: - Metformin ER 1000 mg 2x a day - Farxiga 5 mg before breakfast  Move: - Rybelsus 28 mg before b'fast    Please return for another visit in 3-4 months.  - we checked his HbA1c: 6.5% (slightly higher) - advised to check sugars at different times of the day - 1x a day, rotating check times - advised for yearly eye exams >> he is due-advised to schedule - return to clinic in 3-4 months  2. HL - Reviewed latest lipid panel from 08/2022: LDL above our target of less than 55, HDL low: Lab Results  Component Value Date   CHOL 143 08/29/2022   HDL 38.20 (L) 08/29/2022   LDLCALC 86 08/29/2022   LDLDIRECT 183.6 08/02/2011   TRIG 94.0 08/29/2022   CHOLHDL 4 08/29/2022  -He continues on Lipitor 80 mg daily and Zetia 10 mg daily without side effects  3.  Obesity class III -continue SGLT 2 inhibitor and GLP-1 receptor agonist which should also help with weight loss.  Last visit, I suggested to change Rybelsus to Ozempic for more potent effect on blood sugars and weight, but we continued Rybelsus at the double dose at that time until he ran out.  At today's visit, he tells me that he would prefer to continue with a double dose of Rybelsus. -He gained 1 pound before last visit, previously lost 5-7 pounds -At today's visit, he lost a net of 2 pounds since last visit  Carlus Pavlov, MD PhD [facility] Endocrinology

## 2023-06-19 NOTE — Telephone Encounter (Signed)
New studies show success with Rybelsus up to 50 mg daily.  Patient is already doing well on 28 mg daily.

## 2023-06-19 NOTE — Patient Instructions (Addendum)
Please continue: - Metformin ER 1000 mg 2x a day - Farxiga 5 mg before breakfast  Move: - Rybelsus 28 mg before b'fast    Please return for another visit in 3-4 months.

## 2023-06-20 ENCOUNTER — Other Ambulatory Visit (HOSPITAL_COMMUNITY): Payer: Self-pay

## 2023-06-22 ENCOUNTER — Other Ambulatory Visit (HOSPITAL_COMMUNITY): Payer: Self-pay

## 2023-06-22 NOTE — Telephone Encounter (Signed)
 VOB initiated for 2025 coverage of Orthovisc.

## 2023-06-23 ENCOUNTER — Other Ambulatory Visit (HOSPITAL_COMMUNITY): Payer: Self-pay

## 2023-06-27 ENCOUNTER — Other Ambulatory Visit: Payer: Self-pay | Admitting: Cardiology

## 2023-06-27 ENCOUNTER — Other Ambulatory Visit (HOSPITAL_COMMUNITY): Payer: Self-pay

## 2023-06-27 ENCOUNTER — Other Ambulatory Visit: Payer: Self-pay | Admitting: Internal Medicine

## 2023-06-27 DIAGNOSIS — E785 Hyperlipidemia, unspecified: Secondary | ICD-10-CM

## 2023-06-27 DIAGNOSIS — I429 Cardiomyopathy, unspecified: Secondary | ICD-10-CM

## 2023-06-27 DIAGNOSIS — E1159 Type 2 diabetes mellitus with other circulatory complications: Secondary | ICD-10-CM

## 2023-06-28 NOTE — Telephone Encounter (Signed)
 Metformin refill request complete,

## 2023-06-28 NOTE — Telephone Encounter (Addendum)
 [employer] Stable - Prior Authorization REQUIRED  [employer] FEP - Prior Authorization REQUIRED

## 2023-06-29 NOTE — Telephone Encounter (Signed)
 Marland Kitchen

## 2023-06-30 ENCOUNTER — Other Ambulatory Visit (HOSPITAL_COMMUNITY): Payer: Self-pay

## 2023-06-30 NOTE — Telephone Encounter (Signed)
 Prior Auth initiated via [facility] provider portal  Ref # [id number]

## 2023-06-30 NOTE — Telephone Encounter (Signed)
 [id number] :  1 of 1 SERVICE 1: This request does not contain codes requiring prior approval. If there are special circumstances for which you still want [employer] to review the authorization request, proceed with your auth submission. Your attempt to obtain prior authorization has been recorded in the [facility] system. Based on messaging above, you can proceed with the authorization request by clicking Submit again IF NECESSARY. Otherwise, please DO NOT click submit again. Instead, click HOME or AUTHORIZATIONS and select YES to LOSING CHANGES.

## 2023-07-02 ENCOUNTER — Encounter: Payer: Self-pay | Admitting: Internal Medicine

## 2023-07-02 NOTE — Assessment & Plan Note (Signed)
 Now started, so finish Zpak and Augmentin as prescribed by Urgent Care. Stay well hydrated

## 2023-07-02 NOTE — Assessment & Plan Note (Signed)
Benefits from CPAP with good compliance and control Plan-continue auto 8-20 

## 2023-07-03 ENCOUNTER — Other Ambulatory Visit (HOSPITAL_COMMUNITY): Payer: Self-pay

## 2023-07-03 ENCOUNTER — Telehealth: Payer: Self-pay

## 2023-07-03 NOTE — Telephone Encounter (Signed)
 Received a call from the pharmacy. They are getting a flag on there side due to Rybelsus saying BID. For 14 mg tabs.   Does this patient need to be on that dosing ? Please advise.

## 2023-07-04 ENCOUNTER — Telehealth: Payer: Self-pay

## 2023-07-04 ENCOUNTER — Other Ambulatory Visit (HOSPITAL_COMMUNITY): Payer: Self-pay

## 2023-07-04 NOTE — Telephone Encounter (Signed)
 I called and spoke with [facility]. She has been notified and has run it through the system they will not cover the 2 a day and PA is needed.   Message has been sent to the PA team for this medication FYI.

## 2023-07-04 NOTE — Telephone Encounter (Signed)
 Pharmacy Patient Advocate Encounter   Received notification from Pt Calls Messages that prior authorization for Rybelsus  is required/requested.   Insurance verification completed.   The patient is insured through [facility] .   Per test claim: PA required; PA submitted to above mentioned insurance via CoverMyMeds Key/confirmation #/EOC [id number] Status is pending

## 2023-07-04 NOTE — Telephone Encounter (Signed)
 Pt needs PA for Rybelsus 14 mg tablets 2 a day.  Dr. Elvera Lennox is aware of the dosage.

## 2023-07-08 ENCOUNTER — Other Ambulatory Visit (HOSPITAL_COMMUNITY): Payer: Self-pay

## 2023-07-08 DIAGNOSIS — J209 Acute bronchitis, unspecified: Secondary | ICD-10-CM | POA: Diagnosis not present

## 2023-07-08 DIAGNOSIS — R0989 Other specified symptoms and signs involving the circulatory and respiratory systems: Secondary | ICD-10-CM | POA: Diagnosis not present

## 2023-07-08 DIAGNOSIS — R051 Acute cough: Secondary | ICD-10-CM | POA: Diagnosis not present

## 2023-07-08 MED ORDER — NOREL AD 4-10-325 MG PO TABS
1.0000 | ORAL_TABLET | ORAL | 0 refills | Status: DC | PRN
  Filled 2023-07-08: qty 20, 4d supply, fill #0

## 2023-07-08 MED ORDER — PROMETHAZINE-DM 6.25-15 MG/5ML PO SYRP
5.0000 mL | ORAL_SOLUTION | ORAL | 0 refills | Status: DC | PRN
  Filled 2023-07-08: qty 180, 6d supply, fill #0

## 2023-07-08 MED ORDER — DOXYCYCLINE MONOHYDRATE 100 MG PO CAPS
100.0000 mg | ORAL_CAPSULE | Freq: Two times a day (BID) | ORAL | 0 refills | Status: DC
  Filled 2023-07-08: qty 20, 10d supply, fill #0

## 2023-07-10 ENCOUNTER — Encounter: Payer: Self-pay | Admitting: Internal Medicine

## 2023-07-10 ENCOUNTER — Ambulatory Visit: Payer: Federal, State, Local not specified - PPO | Admitting: Internal Medicine

## 2023-07-10 ENCOUNTER — Telehealth: Payer: Self-pay | Admitting: Internal Medicine

## 2023-07-10 ENCOUNTER — Other Ambulatory Visit (HOSPITAL_COMMUNITY): Payer: Self-pay

## 2023-07-10 VITALS — BP 110/80 | HR 72 | Temp 98.2°F | Ht 69.0 in | Wt 369.0 lb

## 2023-07-10 DIAGNOSIS — J00 Acute nasopharyngitis [common cold]: Secondary | ICD-10-CM

## 2023-07-10 DIAGNOSIS — R051 Acute cough: Secondary | ICD-10-CM

## 2023-07-10 MED ORDER — FLUTICASONE PROPIONATE 50 MCG/ACT NA SUSP
1.0000 | Freq: Every day | NASAL | 3 refills | Status: AC
  Filled 2023-07-10: qty 16, 30d supply, fill #0

## 2023-07-10 MED ORDER — HYDROCOD POLI-CHLORPHE POLI ER 10-8 MG/5ML PO SUER
5.0000 mL | Freq: Two times a day (BID) | ORAL | 0 refills | Status: AC | PRN
  Filled 2023-07-10: qty 70, 7d supply, fill #0

## 2023-07-10 MED ORDER — LEVOCETIRIZINE DIHYDROCHLORIDE 5 MG PO TABS
5.0000 mg | ORAL_TABLET | Freq: Every evening | ORAL | 11 refills | Status: DC
  Filled 2023-07-10: qty 30, 30d supply, fill #0
  Filled 2023-09-21: qty 30, 30d supply, fill #1
  Filled 2023-10-09: qty 30, 30d supply, fill #2
  Filled 2023-12-20: qty 30, 30d supply, fill #3
  Filled 2024-02-01: qty 30, 30d supply, fill #4
  Filled 2024-02-25: qty 30, 30d supply, fill #5
  Filled 2024-03-30: qty 30, 30d supply, fill #6
  Filled 2024-05-06: qty 30, 30d supply, fill #7
  Filled 2024-06-17: qty 30, 30d supply, fill #8

## 2023-07-10 NOTE — Patient Instructions (Signed)
Follow up with Dr. Maple Hudson as needed for sleep apnea.  I think your cough is related to post nasal drainage.   Stop cetirizine, switch to a different anti-histamine like xyzal. Start flonase.  I have sent short term cough syrup - tussionex your pharmacy. Do not take this with promethazine cough syrup.   Flonase - 1 spray on each side of your nose twice a day for first week, then 1 spray on each side.   Instructions for use: If you also use a saline nasal spray or rinse, use that first. Position the head with the chin slightly tucked. Use the right hand to spray into the left nostril and the right hand to spray into the left nostril.   Point the bottle away from the septum of your nose (cartilage that divides the two sides of your nose).  Hold the nostril closed on the opposite side from where you will spray Spray once and gently sniff to pull the medicine into the higher parts of your nose.  Don't sniff too hard as the medicine will drain down the back of your throat instead. Repeat with a second spray on the same side if prescribed. Repeat on the other side of your nose.

## 2023-07-10 NOTE — Telephone Encounter (Signed)
Called and spoke with the pt  He states started with increased cough 07/07/23  Cough has typically been dry, but is now prod with brown sputum  The pt has appt with ND today for acute visit  Nothing further needed at this time

## 2023-07-10 NOTE — Progress Notes (Signed)
Tyler [name]    [id number]    1976/02/19  Primary Care Physician:John, Len Blalock, MD Date of Appointment: 07/10/2023 Established Patient Visit  Chief complaint:   Chief Complaint  Patient presents with   Acute Visit    Cough since Friday (1/17).  Seen at Urgent care on 1/18, negative for covid and flu.  Given doxycycline.  Did not get Promethazine DM.  Does not feel any better, feels like he is getting worse.      HPI: Tyler [name] is a 48 y.o. man with OSA on CPAP, CHF, chronic rhinitis.  Interval Updates: Seen in urgent care 3 days ago for cough, sputum production. Covid/flu negative. Being treated with promethazine DM and doxycycline.   Taking theraflu over the counter. Feels like a tickle in his throat. Wakes him up at night. Sore throat worse. Hasn't worn his cpap the last two nights because of this. Does have drainage and takes zyrtec. No nasal sprays.   No fevers, chills, dyspnea, no contacts but he does drive a city bus.  Denies reflux, heart burn.  Appetite is ok.   I have reviewed the patient's family social and past medical history and updated as appropriate.   Past Medical History:  Diagnosis Date   ACHILLES TENDINITIS, BILATERAL 10/16/2008   ALLERGIC RHINITIS 08/13/2009   CARDIOMYOPATHY 04/21/2009   CHF (congestive heart failure) (HCC)    CONGESTIVE HEART FAILURE 12/04/2007   CORONARY ARTERY DISEASE 12/04/2007   GERD (gastroesophageal reflux disease) 11/01/2011   HYPERLIPIDEMIA 03/12/2007   HYPERTENSION 03/12/2007   OBESITY 04/21/2009   Plantar fasciitis, bilateral 11/01/2011   SLEEP APNEA 03/12/2007    Past Surgical History:  Procedure Laterality Date   CARDIAC CATHETERIZATION N/A 12/19/2014   Procedure: Left Heart Cath and Coronary Angiography;  Surgeon: Lyn Records, MD;  Location: [facility] INVASIVE CV LAB;  Service: Cardiovascular;  Laterality: N/A;   COLONOSCOPY WITH PROPOFOL N/A 11/19/2020   Procedure: COLONOSCOPY WITH PROPOFOL;  Surgeon:  Rachael Fee, MD;  Location: [facility] ENDOSCOPY;  Service: Endoscopy;  Laterality: N/A;   NO PAST SURGERIES      Family History  Problem Relation Age of Onset   Hypertension Other    Heart attack Mother    Heart disease Mother    Diabetes Mother    Heart attack Father    Heart disease Father    Diabetes Father    Asthma Brother    Diabetes Maternal Grandfather     Social History   Occupational History   Occupation: Engineer, [employer] [place]    Employer: [employer]  Tobacco Use   Smoking status: Never   Smokeless tobacco: Never   Tobacco comments:    never  Advertising account planner   Vaping status: Never Used  Substance and Sexual Activity   Alcohol use: No   Drug use: No   Sexual activity: Not on file     Physical Exam: Blood pressure 110/80, pulse 72, temperature 98.2 F (36.8 C), temperature source Oral, height 5\' 9"  (1.753 m), weight (!) 369 lb (167.4 kg), SpO2 97%.  Gen:      No acute distress, obese ENT:  +cobblestoning in oropharynx, no nasal polyps, mucus membranes moist Lungs:    No increased respiratory effort, symmetric chest wall excursion, clear to auscultation bilaterally, no wheezes or crackles CV:         Regular rate and rhythm; no murmurs, rubs, or gallops.  No  pedal edema   Data Reviewed: Imaging: I have personally reviewed the   PFTs:     Latest Ref Rng & Units 12/17/2014   11:50 AM  PFT Results  FVC-Pre L 3.71   FVC-Predicted Pre % 88   FVC-Post L 3.96   FVC-Predicted Post % 93   Pre FEV1/FVC % % 82   Post FEV1/FCV % % 79   FEV1-Pre L 3.03   FEV1-Predicted Pre % 87   FEV1-Post L 3.14   DLCO uncorrected ml/min/mmHg 33.90   DLCO UNC% % 109   DLVA Predicted % 128   TLC L 5.45   TLC % Predicted % 81   RV % Predicted % 101    I have personally reviewed the patient's PFTs and normal pulmonary function  Labs: Lab Results  Component Value Date   WBC 7.8 08/29/2022   HGB 14.3 08/29/2022   HCT 43.3 08/29/2022   MCV 86.8  08/29/2022   PLT 252.0 08/29/2022   Lab Results  Component Value Date   NA 139 01/18/2023   K 4.0 01/18/2023   CL 103 01/18/2023   CO2 24 01/18/2023    Immunization status: Immunization History  Administered Date(s) Administered   Influenza Whole 04/01/2009, 03/09/2010   Influenza, Seasonal, Injecte, Preservative Fre 02/27/2023   Influenza,inj,Quad PF,6+ Mos 04/27/2013, 02/11/2014, 06/09/2015, 04/05/2016, 07/18/2017, 06/22/2018, 04/06/2019, 03/25/2021, 02/28/2022   PFIZER(Purple Top)SARS-COV-2 Vaccination 08/29/2019, 09/19/2019, 05/12/2020   PNEUMOCOCCAL CONJUGATE-20 01/24/2022   Pneumococcal Polysaccharide-23 10/21/2014   Td 10/16/2008   Tdap 08/28/2018    External Records Personally Reviewed: pulmonary, pcp  Assessment:  Acute cough secondary to acute rhinitis, likely viral URI.  OSA on CPAP  Plan/Recommendations: I think your cough is related to post nasal drainage.   Stop cetirizine, switch to a different anti-histamine like xyzal. Start flonase.  I have sent short term cough syrup - tussionex your pharmacy. Do not take this with promethazine cough syrup.    Return to Care: With Dr Maple Hudson for OSA.    Durel Salts, MD Pulmonary and Critical Care Medicine [facility] Office:[phone]

## 2023-07-10 NOTE — Telephone Encounter (Signed)
PT states in [place] note:  Cannot stop coughing lots of flame in my throat.   Please call @ [phone]

## 2023-07-10 NOTE — Telephone Encounter (Signed)
Pharmacy Patient Advocate Encounter  Received notification from [facility] that Prior Authorization for Rybelsus has been DENIED.  Full denial letter will be uploaded to the media tab. See denial reason below.  the quantity requested exceeds the prior approval quantity limits and therefore medical necessity is not established for this quantity. The maximum amount allowed has already been approved

## 2023-07-12 ENCOUNTER — Other Ambulatory Visit (HOSPITAL_COMMUNITY): Payer: Self-pay

## 2023-07-12 MED ORDER — SEMAGLUTIDE(0.25 OR 0.5MG/DOS) 2 MG/3ML ~~LOC~~ SOPN
0.5000 mg | PEN_INJECTOR | SUBCUTANEOUS | 2 refills | Status: DC
  Filled 2023-07-12: qty 6, 56d supply, fill #0
  Filled 2023-10-09: qty 6, 56d supply, fill #1
  Filled 2023-12-02: qty 3, 28d supply, fill #2

## 2023-07-12 NOTE — Telephone Encounter (Signed)
J, Since his insurance did not approve the higher dose of Rybelsus, the options are to go back to the lower dose, 40 mg daily or to switch to Ozempic, 0.5 mg weekly.  I would probably suggest to go ahead with Ozempic.  Lets try to send this to the pharmacy for 3 months, if he agrees.

## 2023-07-12 NOTE — Addendum Note (Signed)
Addended by: Pollie Meyer on: 07/12/2023 03:47 PM   Modules accepted: Orders

## 2023-07-12 NOTE — Telephone Encounter (Signed)
Pt agreed with the Ozempic.   Rx has been sent  Requested Prescriptions   Signed Prescriptions Disp Refills   Semaglutide,0.25 or 0.5MG /DOS, 2 MG/3ML SOPN 6 mL 2    Sig: Inject 0.5 mg into the skin once a week.    Authorizing Provider: Carlus Pavlov    Ordering User: Pollie Meyer

## 2023-07-13 ENCOUNTER — Other Ambulatory Visit (HOSPITAL_COMMUNITY): Payer: Self-pay

## 2023-07-28 ENCOUNTER — Other Ambulatory Visit (HOSPITAL_COMMUNITY): Payer: Self-pay

## 2023-07-28 ENCOUNTER — Telehealth: Payer: Self-pay

## 2023-07-28 MED ORDER — NIRMATRELVIR/RITONAVIR (PAXLOVID)TABLET
3.0000 | ORAL_TABLET | Freq: Two times a day (BID) | ORAL | 0 refills | Status: AC
  Filled 2023-07-28: qty 30, 5d supply, fill #0

## 2023-07-28 MED ORDER — HYDROCODONE BIT-HOMATROP MBR 5-1.5 MG/5ML PO SOLN
5.0000 mL | Freq: Four times a day (QID) | ORAL | 0 refills | Status: AC | PRN
  Filled 2023-07-28: qty 180, 9d supply, fill #0

## 2023-07-28 NOTE — Addendum Note (Signed)
 Addended by: Roslyn Coombe on: 07/28/2023 04:47 PM   Modules accepted: Orders

## 2023-07-28 NOTE — Telephone Encounter (Signed)
Ok this is done erx 

## 2023-07-28 NOTE — Telephone Encounter (Signed)
 Copied from CRM [id number]. Topic: Clinical - Medical Advice >> Jul 28, 2023  4:27 PM Jinnie BROCKS wrote: Reason for CRM: Patient stated that his wife tested positive for Covid on 07/28/2023. Patient was requesting for Dr. Norleen to send him in the same medication that he sent his wife.

## 2023-08-03 ENCOUNTER — Other Ambulatory Visit: Payer: Self-pay | Admitting: Internal Medicine

## 2023-08-03 ENCOUNTER — Other Ambulatory Visit (HOSPITAL_COMMUNITY): Payer: Self-pay

## 2023-08-03 ENCOUNTER — Encounter: Payer: Self-pay | Admitting: Internal Medicine

## 2023-08-03 DIAGNOSIS — E1165 Type 2 diabetes mellitus with hyperglycemia: Secondary | ICD-10-CM

## 2023-08-03 NOTE — Telephone Encounter (Signed)
Prior Auth initiated for BILAT Orthovisc via FAX form.

## 2023-08-04 ENCOUNTER — Telehealth: Payer: Self-pay

## 2023-08-04 ENCOUNTER — Other Ambulatory Visit (HOSPITAL_COMMUNITY): Payer: Self-pay

## 2023-08-04 NOTE — Telephone Encounter (Signed)
Pt needs PA for Ozempic

## 2023-08-04 NOTE — Telephone Encounter (Signed)
Pharmacy Patient Advocate Encounter   Received notification from Pt Calls Messages that prior authorization for Ozempic is required/requested.   Insurance verification completed.   The patient is insured through [facility] .   Per test claim: PA required; PA submitted to above mentioned insurance via CoverMyMeds Key/confirmation #/EOC [id number] Status is pending   PA has been APPROVED through 08/04/23

## 2023-08-07 ENCOUNTER — Other Ambulatory Visit (HOSPITAL_COMMUNITY): Payer: Self-pay

## 2023-08-08 NOTE — Telephone Encounter (Signed)
 Called BC[employer] FEP, was advised that previous prior Berkley Harvey is on file from Jan 2025 and has a status of VOID, reason unknown. She was able to confirm that fazed prior auth was received 08/03/23, it will take 3-5 business days to receive a response back by fax.

## 2023-08-10 NOTE — Progress Notes (Signed)
 HPI: FU hypertension and nonischemic cardiomyopathy. Patient had a previous nonischemic cardiomyopathy which improved on fu echos. However he was seen for recurrent chest pain. Nuclear study June 2016 showed an ejection fraction of 37%. There is an old inferior/inferior lateral infarct with peri-infarct ischemia. Cardiac catheterization July 2016 showed a 90% first posterior lateral branch, 90% distal LAD and 50% proximal RCA. Medical therapy recommended. Echocardiogram July 2024 showed normal LV function, mild left ventricular hypertrophy, grade 1 diastolic dysfunction, mild to moderate left atrial enlargement.  Since last seen, the patient denies any dyspnea on exertion, orthopnea, PND, pedal edema, palpitations, syncope or chest pain.   Current Outpatient Medications  Medication Sig Dispense Refill   albuterol (VENTOLIN HFA) 108 (90 Base) MCG/ACT inhaler Inhale 2 puffs into the lungs every 6 (six) hours as needed for wheezing. 6.7 g 11   aspirin EC 81 MG tablet Take 81 mg by mouth daily.     atorvastatin (LIPITOR) 80 MG tablet Take 1 tablet by mouth once daily 90 tablet 1   Blood Glucose Monitoring Suppl (FREESTYLE LITE) w/Device KIT Use daily as directed to test blood glucose. 1 kit 3   Blood Glucose Monitoring Suppl (ONETOUCH VERIO REFLECT) w/Device KIT Use as advised 1 kit 0   carvedilol (COREG) 25 MG tablet Take 1 tablet (25 mg total) by mouth 2 (two) times daily with a meal. 180 tablet 3   chlorpheniramine-HYDROcodone (TUSSIONEX) 10-8 MG/5ML Take 5 mLs by mouth every 12 (twelve) hours as needed for cough. 70 mL 0   Cholecalciferol (VITAMIN D) 50 MCG (2000 UT) tablet Take 2,000 Units by mouth daily.     Continuous Glucose Sensor (FREESTYLE LIBRE 3 PLUS SENSOR) MISC 1 each by Does not apply route every 14 (fourteen) days. 6 each 3   dapagliflozin propanediol (FARXIGA) 5 MG TABS tablet Take 1 tablet (5 mg total) by mouth daily before breakfast. 90 tablet 3   doxycycline (MONODOX) 100 MG  capsule Take 1 capsule (100 mg total) by mouth 2 (two) times daily for 10 days. 20 capsule 0   ezetimibe (ZETIA) 10 MG tablet Take 1 tablet by mouth once daily 90 tablet 3   fluticasone (FLONASE) 50 MCG/ACT nasal spray Place 1 spray into both nostrils daily. 16 g 3   furosemide (LASIX) 20 MG tablet Take 1 tablet by mouth once daily 90 tablet 1   glucose blood (FREESTYLE LITE) test strip Use 1 strip to check blood glucose once daily as directed. 50 each 5   Lancets (FREESTYLE) lancets Use 1 lancet once daily as directed to test blood glucose. 100 each 12   Lancets MISC Use as directed twice per day E11.9 200 each 12   levocetirizine (XYZAL) 5 MG tablet Take 1 tablet (5 mg total) by mouth every evening. 30 tablet 11   metFORMIN (GLUCOPHAGE-XR) 500 MG 24 hr tablet TAKE 2 TABLETS BY MOUTH TWICE DAILY WITH MEALS 360 tablet 0   Multiple Vitamins-Minerals (MULTIVITAMIN WITH MINERALS) tablet Take 1 tablet by mouth daily.     nitroGLYCERIN (NITROSTAT) 0.4 MG SL tablet Place 1 tablet (0.4 mg total) under the tongue every 5 (five) minutes as needed for chest pain. 25 tablet 3   OneTouch Delica Lancets 33G MISC Use once a day 100 each 3   PRESCRIPTION MEDICATION Inhale into the lungs at bedtime. CPAP     sacubitril-valsartan (ENTRESTO) 97-103 MG Take 1 tablet by mouth 2 times daily. 180 tablet 3   Semaglutide,0.25 or 0.5MG /DOS, 2 MG/3ML  SOPN Inject 0.5 mg into the skin once a week. 6 mL 2   spironolactone (ALDACTONE) 25 MG tablet Take 1 tablet by mouth once daily 90 tablet 1   budesonide-formoterol (SYMBICORT) 160-4.5 MCG/ACT inhaler INHALE 2 PUFFS BY MOUTH TWICE A DAY 10.2 g 12   No current facility-administered medications for this visit.     Past Medical History:  Diagnosis Date   ACHILLES TENDINITIS, BILATERAL 10/16/2008   ALLERGIC RHINITIS 08/13/2009   CARDIOMYOPATHY 04/21/2009   CHF (congestive heart failure) (HCC)    CONGESTIVE HEART FAILURE 12/04/2007   CORONARY ARTERY DISEASE 12/04/2007   GERD  (gastroesophageal reflux disease) 11/01/2011   HYPERLIPIDEMIA 03/12/2007   HYPERTENSION 03/12/2007   OBESITY 04/21/2009   Plantar fasciitis, bilateral 11/01/2011   SLEEP APNEA 03/12/2007    Past Surgical History:  Procedure Laterality Date   CARDIAC CATHETERIZATION N/A 12/19/2014   Procedure: Left Heart Cath and Coronary Angiography;  Surgeon: Lyn Records, MD;  Location: [facility] INVASIVE CV LAB;  Service: Cardiovascular;  Laterality: N/A;   COLONOSCOPY WITH PROPOFOL N/A 11/19/2020   Procedure: COLONOSCOPY WITH PROPOFOL;  Surgeon: Rachael Fee, MD;  Location: [facility] ENDOSCOPY;  Service: Endoscopy;  Laterality: N/A;   NO PAST SURGERIES      Social History   Socioeconomic History   Marital status: Married    Spouse name: Not on file   Number of children: 3   Years of education: Not on file   Highest education level: Not on file  Occupational History   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[employer]    Employer: [employer]  Tobacco Use   Smoking status: Never   Smokeless tobacco: Never   Tobacco comments:    never  Vaping Use   Vaping status: Never Used  Substance and Sexual Activity   Alcohol use: No   Drug use: No   Sexual activity: Not on file  Other Topics Concern   Not on file  Social History Narrative   Not on file   Social Drivers of Health   Financial Resource Strain: Not on file  Food Insecurity: Not on file  Transportation Needs: Not on file  Physical Activity: Not on file  Stress: Not on file  Social Connections: Not on file  Intimate Partner Violence: Not on file    Family History  Problem Relation Age of Onset   Hypertension Other    Heart attack Mother    Heart disease Mother    Diabetes Mother    Heart attack Father    Heart disease Father    Diabetes Father    Asthma Brother    Diabetes Maternal Grandfather     ROS: no fevers or chills, productive cough, hemoptysis, dysphasia, odynophagia, melena, hematochezia, dysuria, hematuria, rash,  seizure activity, orthopnea, PND, pedal edema, claudication. Remaining systems are negative.  Physical Exam: Well-developed well-nourished in no acute distress.  Skin is warm and dry.  HEENT is normal.  Neck is supple.  Chest is clear to auscultation with normal expansion.  Cardiovascular exam is regular rate and rhythm.  Abdominal exam nontender or distended. No masses palpated. Extremities show no edema. neuro grossly intact  EKG Interpretation Date/Time:  Monday August 21 2023 15:33:26 EST Ventricular Rate:  73 PR Interval:  196 QRS Duration:  82 QT Interval:  398 QTC Calculation: 438 R Axis:   60  Text Interpretation: Sinus rhythm with occasional Premature ventricular complexes When compared with ECG of 02-Jan-2023 14:01, No significant change was found Confirmed by  Olga Millers ([id number]) on 08/21/2023 3:42:15 PM    A/P  1 history of nonischemic cardiomyopathy-LV function has normalized on most recent echocardiogram.  Continue Entresto, carvedilol, spironolactone and Farxiga.  2 chronic combined systolic/diastolic congestive heart failure-volume status is difficult to assess due to obesity but he appears to be reasonably well compensated.  Will continue Lasix and spironolactone at present dose.  Check potassium and renal function.  3 hypertension-patient's blood pressure is controlled.  Continue present medical regimen.  4 coronary artery disease-patient denies chest pain.  Continue aspirin and statin.  5 hyperlipidemia-continue statin.  Check lipids and liver.  6 morbid obesity-we discussed weight loss.  Olga Millers, MD

## 2023-08-11 ENCOUNTER — Other Ambulatory Visit (HOSPITAL_COMMUNITY): Payer: Self-pay

## 2023-08-17 ENCOUNTER — Other Ambulatory Visit: Payer: Self-pay | Admitting: Cardiology

## 2023-08-17 DIAGNOSIS — I429 Cardiomyopathy, unspecified: Secondary | ICD-10-CM

## 2023-08-18 NOTE — Telephone Encounter (Signed)
 Pt has not been seen in over 1 year, closing encounter.

## 2023-08-21 ENCOUNTER — Ambulatory Visit: Payer: Federal, State, Local not specified - PPO | Attending: Cardiology | Admitting: Cardiology

## 2023-08-21 ENCOUNTER — Encounter: Payer: Self-pay | Admitting: Cardiology

## 2023-08-21 ENCOUNTER — Encounter: Payer: Self-pay | Admitting: Internal Medicine

## 2023-08-21 VITALS — BP 118/76 | HR 75 | Ht 69.0 in | Wt 372.4 lb

## 2023-08-21 DIAGNOSIS — I428 Other cardiomyopathies: Secondary | ICD-10-CM | POA: Diagnosis not present

## 2023-08-21 DIAGNOSIS — I10 Essential (primary) hypertension: Secondary | ICD-10-CM

## 2023-08-21 DIAGNOSIS — E785 Hyperlipidemia, unspecified: Secondary | ICD-10-CM

## 2023-08-21 DIAGNOSIS — E538 Deficiency of other specified B group vitamins: Secondary | ICD-10-CM

## 2023-08-21 DIAGNOSIS — Z125 Encounter for screening for malignant neoplasm of prostate: Secondary | ICD-10-CM

## 2023-08-21 DIAGNOSIS — E559 Vitamin D deficiency, unspecified: Secondary | ICD-10-CM

## 2023-08-21 DIAGNOSIS — I509 Heart failure, unspecified: Secondary | ICD-10-CM | POA: Diagnosis not present

## 2023-08-21 DIAGNOSIS — E1159 Type 2 diabetes mellitus with other circulatory complications: Secondary | ICD-10-CM

## 2023-08-21 NOTE — Patient Instructions (Signed)
    Lab Work:  CMET AND LIPID  If you have labs (blood work) drawn today and your tests are completely normal, you will receive your results only by: MyChart Message (if you have MyChart) OR A paper copy in the mail If you have any lab test that is abnormal or we need to change your treatment, we will call you to review the results.   Follow-Up: At [facility], you and your health needs are our priority.  As part of our continuing mission to provide you with exceptional heart care, we have created designated Provider Care Teams.  These Care Teams include your primary Cardiologist (physician) and Advanced Practice Providers (APPs -  Physician Assistants and Nurse Practitioners) who all work together to provide you with the care you need, when you need it.    Your next appointment:   12 month(s)  Provider:   Olga Millers, MD

## 2023-08-25 ENCOUNTER — Other Ambulatory Visit

## 2023-08-25 ENCOUNTER — Other Ambulatory Visit: Payer: Self-pay | Admitting: Internal Medicine

## 2023-08-25 DIAGNOSIS — E1159 Type 2 diabetes mellitus with other circulatory complications: Secondary | ICD-10-CM

## 2023-08-25 DIAGNOSIS — E559 Vitamin D deficiency, unspecified: Secondary | ICD-10-CM | POA: Diagnosis not present

## 2023-08-25 DIAGNOSIS — Z125 Encounter for screening for malignant neoplasm of prostate: Secondary | ICD-10-CM | POA: Diagnosis not present

## 2023-08-25 DIAGNOSIS — I10 Essential (primary) hypertension: Secondary | ICD-10-CM | POA: Diagnosis not present

## 2023-08-25 DIAGNOSIS — E1165 Type 2 diabetes mellitus with hyperglycemia: Secondary | ICD-10-CM

## 2023-08-25 DIAGNOSIS — E538 Deficiency of other specified B group vitamins: Secondary | ICD-10-CM | POA: Diagnosis not present

## 2023-08-25 LAB — LIPID PANEL
Cholesterol: 140 mg/dL (ref 0–200)
HDL: 36.2 mg/dL — ABNORMAL LOW (ref >39.00)
LDL Cholesterol: 83 mg/dL (ref 0–99)
NonHDL: 103.46
Total CHOL/HDL Ratio: 4
Triglycerides: 104 mg/dL (ref 0.0–149.0)
VLDL: 20.8 mg/dL (ref 0.0–40.0)

## 2023-08-25 LAB — BASIC METABOLIC PANEL
BUN: 16 mg/dL (ref 6–23)
CO2: 24 meq/L (ref 19–32)
Calcium: 8.9 mg/dL (ref 8.4–10.5)
Chloride: 106 meq/L (ref 96–112)
Creatinine, Ser: 0.99 mg/dL (ref 0.40–1.50)
GFR: 89.32 mL/min (ref >60.00)
Glucose, Bld: 81 mg/dL (ref 70–99)
Potassium: 4.1 meq/L (ref 3.5–5.1)
Sodium: 141 meq/L (ref 135–145)

## 2023-08-25 LAB — PSA: PSA: 0.93 ng/mL (ref 0.10–4.00)

## 2023-08-25 LAB — CBC WITH DIFFERENTIAL/PLATELET
Basophils Absolute: 0.1 10*3/uL (ref 0.0–0.1)
Basophils Relative: 0.7 % (ref 0.0–3.0)
Eosinophils Absolute: 0.5 10*3/uL (ref 0.0–0.7)
Eosinophils Relative: 7 % — ABNORMAL HIGH (ref 0.0–5.0)
HCT: 43 % (ref 39.0–52.0)
Hemoglobin: 13.9 g/dL (ref 13.0–17.0)
Lymphocytes Relative: 31.6 % (ref 12.0–46.0)
Lymphs Abs: 2.5 10*3/uL (ref 0.7–4.0)
MCHC: 32.3 g/dL (ref 30.0–36.0)
MCV: 88.2 fl (ref 78.0–100.0)
Monocytes Absolute: 0.9 10*3/uL (ref 0.1–1.0)
Monocytes Relative: 11.4 % (ref 3.0–12.0)
Neutro Abs: 3.9 10*3/uL (ref 1.4–7.7)
Neutrophils Relative %: 49.3 % (ref 43.0–77.0)
Platelets: 223 10*3/uL (ref 150.0–400.0)
RBC: 4.88 Mil/uL (ref 4.22–5.81)
RDW: 14.6 % (ref 11.5–15.5)
WBC: 7.9 10*3/uL (ref 4.0–10.5)

## 2023-08-25 LAB — VITAMIN D 25 HYDROXY (VIT D DEFICIENCY, FRACTURES): VITD: 41.4 ng/mL (ref 30.00–100.00)

## 2023-08-25 LAB — HEPATIC FUNCTION PANEL
ALT: 19 U/L (ref 0–53)
AST: 15 U/L (ref 0–37)
Albumin: 4.2 g/dL (ref 3.5–5.2)
Alkaline Phosphatase: 71 U/L (ref 39–117)
Bilirubin, Direct: 0.1 mg/dL (ref 0.0–0.3)
Total Bilirubin: 0.4 mg/dL (ref 0.2–1.2)
Total Protein: 7.1 g/dL (ref 6.0–8.3)

## 2023-08-25 LAB — TSH: TSH: 1.04 u[IU]/mL (ref 0.35–5.50)

## 2023-08-25 LAB — HEMOGLOBIN A1C: Hgb A1c MFr Bld: 6.7 % — ABNORMAL HIGH (ref 4.6–6.5)

## 2023-08-25 LAB — VITAMIN B12: Vitamin B-12: 386 pg/mL (ref 211–911)

## 2023-08-28 ENCOUNTER — Other Ambulatory Visit (HOSPITAL_COMMUNITY): Payer: Self-pay

## 2023-08-28 ENCOUNTER — Ambulatory Visit (INDEPENDENT_AMBULATORY_CARE_PROVIDER_SITE_OTHER): Payer: Federal, State, Local not specified - PPO | Admitting: Internal Medicine

## 2023-08-28 VITALS — BP 112/66 | HR 89 | Temp 97.8°F | Ht 69.0 in | Wt 371.4 lb

## 2023-08-28 DIAGNOSIS — I10 Essential (primary) hypertension: Secondary | ICD-10-CM

## 2023-08-28 DIAGNOSIS — E1159 Type 2 diabetes mellitus with other circulatory complications: Secondary | ICD-10-CM

## 2023-08-28 DIAGNOSIS — Z0001 Encounter for general adult medical examination with abnormal findings: Secondary | ICD-10-CM | POA: Diagnosis not present

## 2023-08-28 DIAGNOSIS — J309 Allergic rhinitis, unspecified: Secondary | ICD-10-CM

## 2023-08-28 DIAGNOSIS — E559 Vitamin D deficiency, unspecified: Secondary | ICD-10-CM

## 2023-08-28 DIAGNOSIS — E1165 Type 2 diabetes mellitus with hyperglycemia: Secondary | ICD-10-CM

## 2023-08-28 DIAGNOSIS — E785 Hyperlipidemia, unspecified: Secondary | ICD-10-CM | POA: Diagnosis not present

## 2023-08-28 DIAGNOSIS — Z7984 Long term (current) use of oral hypoglycemic drugs: Secondary | ICD-10-CM

## 2023-08-28 DIAGNOSIS — T7840XA Allergy, unspecified, initial encounter: Secondary | ICD-10-CM

## 2023-08-28 LAB — MICROALBUMIN / CREATININE URINE RATIO
Creatinine,U: 132.2 mg/dL
Microalb Creat Ratio: 5.3 mg/g (ref 0.0–30.0)
Microalb, Ur: <0.7 mg/dL (ref 0.0–1.9)

## 2023-08-28 MED ORDER — PREDNISONE 10 MG PO TABS
ORAL_TABLET | ORAL | 0 refills | Status: AC
  Filled 2023-08-28: qty 18, 9d supply, fill #0

## 2023-08-28 MED ORDER — METHYLPREDNISOLONE ACETATE 80 MG/ML IJ SUSP
80.0000 mg | Freq: Once | INTRAMUSCULAR | Status: AC
  Administered 2023-08-28: 80 mg via INTRAMUSCULAR

## 2023-08-28 NOTE — Progress Notes (Unsigned)
 Patient ID: Kiara Keep, male   DOB: 10-05-1975, 48 y.o.   MRN: [id number]         Chief Complaint:: wellness exam and allergies, DM, hld, low vit d, htn       HPI:  Watt Geiler is a 48 y.o. male here for wellness exam; due for eye exam, o/w up to date                        Also peak wt has been 416, lost wt overall but just started ozempic in last 3 wks per endo.  Pt denies chest pain, increased sob or doe, wheezing, orthopnea, PND, increased LE swelling, palpitations, dizziness or syncope.   Pt denies polydipsia, polyuria, or new focal neuro s/s.    Pt denies fever, wt loss, night sweats, loss of appetite, or other constitutional symptoms  Does have several wks ongoing nasal allergy symptoms with clearish congestion, itch and sneezing, without fever, pain, ST, cough, swelling or wheezing.    Wt Readings from Last 3 Encounters:  08/28/23 (!) 371 lb 6 oz (168.5 kg)  08/21/23 (!) 372 lb 6.4 oz (168.9 kg)  07/10/23 (!) 369 lb (167.4 kg)   BP Readings from Last 3 Encounters:  08/28/23 112/66  08/21/23 118/76  07/10/23 110/80   Immunization History  Administered Date(s) Administered   Influenza Whole 04/01/2009, 03/09/2010   Influenza, Seasonal, Injecte, Preservative Fre 02/27/2023   Influenza,inj,Quad PF,6+ Mos 04/27/2013, 02/11/2014, 06/09/2015, 04/05/2016, 07/18/2017, 06/22/2018, 04/06/2019, 03/25/2021, 02/28/2022   PFIZER(Purple Top)SARS-COV-2 Vaccination 08/29/2019, 09/19/2019, 05/12/2020   PNEUMOCOCCAL CONJUGATE-20 01/24/2022   Pneumococcal Polysaccharide-23 10/21/2014   Td 10/16/2008   Tdap 08/28/2018   Health Maintenance Due  Topic Date Due   OPHTHALMOLOGY EXAM  05/10/2023      Past Medical History:  Diagnosis Date   ACHILLES TENDINITIS, BILATERAL 10/16/2008   ALLERGIC RHINITIS 08/13/2009   CARDIOMYOPATHY 04/21/2009   CHF (congestive heart failure) (HCC)    CONGESTIVE HEART FAILURE 12/04/2007   CORONARY ARTERY DISEASE 12/04/2007   GERD (gastroesophageal  reflux disease) 11/01/2011   HYPERLIPIDEMIA 03/12/2007   HYPERTENSION 03/12/2007   OBESITY 04/21/2009   Plantar fasciitis, bilateral 11/01/2011   SLEEP APNEA 03/12/2007   Past Surgical History:  Procedure Laterality Date   CARDIAC CATHETERIZATION N/A 12/19/2014   Procedure: Left Heart Cath and Coronary Angiography;  Surgeon: Lyn Records, MD;  Location: [facility] INVASIVE CV LAB;  Service: Cardiovascular;  Laterality: N/A;   COLONOSCOPY WITH PROPOFOL N/A 11/19/2020   Procedure: COLONOSCOPY WITH PROPOFOL;  Surgeon: Rachael Fee, MD;  Location: [facility] ENDOSCOPY;  Service: Endoscopy;  Laterality: N/A;   NO PAST SURGERIES      reports that he has never smoked. He has never used smokeless tobacco. He reports that he does not drink alcohol and does not use drugs. family history includes Asthma in his brother; Diabetes in his father, maternal grandfather, and mother; Heart attack in his father and mother; Heart disease in his father and mother; Hypertension in an other family member. No Known Allergies Current Outpatient Medications on File Prior to Visit  Medication Sig Dispense Refill   albuterol (VENTOLIN HFA) 108 (90 Base) MCG/ACT inhaler Inhale 2 puffs into the lungs every 6 (six) hours as needed for wheezing. 6.7 g 11   aspirin EC 81 MG tablet Take 81 mg by mouth daily.     atorvastatin (LIPITOR) 80 MG tablet Take 1 tablet by mouth once daily 90 tablet 1  Blood Glucose Monitoring Suppl (FREESTYLE LITE) w/Device KIT Use daily as directed to test blood glucose. 1 kit 3   Blood Glucose Monitoring Suppl (ONETOUCH VERIO REFLECT) w/Device KIT Use as advised 1 kit 0   carvedilol (COREG) 25 MG tablet Take 1 tablet (25 mg total) by mouth 2 (two) times daily with a meal. 180 tablet 3   chlorpheniramine-HYDROcodone (TUSSIONEX) 10-8 MG/5ML Take 5 mLs by mouth every 12 (twelve) hours as needed for cough. 70 mL 0   Cholecalciferol (VITAMIN D) 50 MCG (2000 UT) tablet Take 2,000 Units by mouth daily.     Continuous  Glucose Sensor (FREESTYLE LIBRE 3 PLUS SENSOR) MISC 1 each by Does not apply route every 14 (fourteen) days. 6 each 3   dapagliflozin propanediol (FARXIGA) 5 MG TABS tablet Take 1 tablet (5 mg total) by mouth daily before breakfast. 90 tablet 3   ezetimibe (ZETIA) 10 MG tablet Take 1 tablet by mouth once daily 90 tablet 3   fluticasone (FLONASE) 50 MCG/ACT nasal spray Place 1 spray into both nostrils daily. 16 g 3   furosemide (LASIX) 20 MG tablet Take 1 tablet by mouth once daily 90 tablet 1   glucose blood (FREESTYLE LITE) test strip Use 1 strip to check blood glucose once daily as directed. 50 each 5   Lancets (FREESTYLE) lancets Use 1 lancet once daily as directed to test blood glucose. 100 each 12   Lancets MISC Use as directed twice per day E11.9 200 each 12   levocetirizine (XYZAL) 5 MG tablet Take 1 tablet (5 mg total) by mouth every evening. 30 tablet 11   metFORMIN (GLUCOPHAGE-XR) 500 MG 24 hr tablet TAKE 2 TABLETS BY MOUTH TWICE DAILY WITH MEALS 360 tablet 0   Multiple Vitamins-Minerals (MULTIVITAMIN WITH MINERALS) tablet Take 1 tablet by mouth daily.     nitroGLYCERIN (NITROSTAT) 0.4 MG SL tablet Place 1 tablet (0.4 mg total) under the tongue every 5 (five) minutes as needed for chest pain. 25 tablet 3   OneTouch Delica Lancets 33G MISC Use once a day 100 each 3   PRESCRIPTION MEDICATION Inhale into the lungs at bedtime. CPAP     sacubitril-valsartan (ENTRESTO) 97-103 MG Take 1 tablet by mouth 2 times daily. 180 tablet 3   Semaglutide,0.25 or 0.5MG /DOS, 2 MG/3ML SOPN Inject 0.5 mg into the skin once a week. 6 mL 2   spironolactone (ALDACTONE) 25 MG tablet Take 1 tablet by mouth once daily 90 tablet 1   budesonide-formoterol (SYMBICORT) 160-4.5 MCG/ACT inhaler INHALE 2 PUFFS BY MOUTH TWICE A DAY 10.2 g 12   No current facility-administered medications on file prior to visit.        ROS:  All others reviewed and negative.  Objective        PE:  BP 112/66 (BP Location: Right Arm,  Patient Position: Sitting, Cuff Size: Normal)   Pulse 89   Temp 97.8 F (36.6 C) (Temporal)   Ht 5\' 9"  (1.753 m)   Wt (!) 371 lb 6 oz (168.5 kg)   BMI 54.84 kg/m                 Constitutional: Pt appears in NAD               HENT: Head: NCAT.                Right Ear: External ear normal.                 Left  Ear: External ear normal.  Bilat tm's with mild erythema.  Max sinus areas non tender.  Pharynx with mild erythema, no exudate               Eyes: . Pupils are equal, round, and reactive to light. Conjunctivae and EOM are normal               Nose: without d/c or deformity               Neck: Neck supple. Gross normal ROM               Cardiovascular: Normal rate and regular rhythm.                 Pulmonary/Chest: Effort normal and breath sounds without rales or wheezing.                Abd:  Soft, NT, ND, + BS, no organomegaly               Neurological: Pt is alert. At baseline orientation, motor grossly intact               Skin: Skin is warm. No rashes, no other new lesions, LE edema - none               Psychiatric: Pt behavior is normal without agitation   Micro: none  Cardiac tracings I have personally interpreted today:  none  Pertinent Radiological findings (summarize): none   Lab Results  Component Value Date   WBC 7.9 08/25/2023   HGB 13.9 08/25/2023   HCT 43.0 08/25/2023   PLT 223.0 08/25/2023   GLUCOSE 81 08/25/2023   CHOL 140 08/25/2023   TRIG 104.0 08/25/2023   HDL 36.20 (L) 08/25/2023   LDLDIRECT 183.6 08/02/2011   LDLCALC 83 08/25/2023   ALT 19 08/25/2023   AST 15 08/25/2023   NA 141 08/25/2023   K 4.1 08/25/2023   CL 106 08/25/2023   CREATININE 0.99 08/25/2023   BUN 16 08/25/2023   CO2 24 08/25/2023   TSH 1.04 08/25/2023   PSA 0.93 08/25/2023   INR 1.1 06/11/2022   HGBA1C 6.7 (H) 08/25/2023   MICROALBUR <0.7 08/28/2023   Assessment/Plan:  Demond Shallenberger is a 48 y.o. Other or two or more races [6] Black or African American [2] male  with  has a past medical history of ACHILLES TENDINITIS, BILATERAL (10/16/2008), ALLERGIC RHINITIS (08/13/2009), CARDIOMYOPATHY (04/21/2009), CHF (congestive heart failure) (HCC), CONGESTIVE HEART FAILURE (12/04/2007), CORONARY ARTERY DISEASE (12/04/2007), GERD (gastroesophageal reflux disease) (11/01/2011), HYPERLIPIDEMIA (03/12/2007), HYPERTENSION (03/12/2007), OBESITY (04/21/2009), Plantar fasciitis, bilateral (11/01/2011), and SLEEP APNEA (03/12/2007).  Encounter for well adult exam with abnormal findings Age and sex appropriate education and counseling updated with regular exercise and diet Referrals for preventative services - for optho referral Immunizations addressed - none needed Smoking counseling  - none needed Evidence for depression or other mood disorder - none significant Most recent labs reviewed. I have personally reviewed and have noted: 1) the patient's medical and social history 2) The patient's current medications and supplements 3) The patient's height, weight, and BMI have been recorded in the chart   Dyslipidemia Lab Results  Component Value Date   LDLCALC 83 08/25/2023   Uncontrolled,, pt to continue current statin lipitor 80 mg every day zetia 10 every day, declines add repatha   Essential hypertension BP Readings from Last 3 Encounters:  08/28/23 112/66  08/21/23 118/76  07/10/23 110/80   Stable, pt  to continue medical treatment coreg 25 bid   Poorly controlled type 2 diabetes mellitus with circulatory disorder (HCC) Lab Results  Component Value Date   HGBA1C 6.7 (H) 08/25/2023   Stable, pt to continue current medical treatment farxiga 5 mg every day, metformin ER 500 mg - 2 bid, and recent ozempic 0.5 mg weekly; refer optho   Vitamin D deficiency Last vitamin D Lab Results  Component Value Date   VD25OH 41.40 08/25/2023   Stable, cont oral replacement   Allergic rhinitis Mild to mod, for depomedrol 80 mg IM , prednisone taper,,  to f/u any worsening  symptoms or concerns  Followup: Return in about 6 months (around 02/28/2024).  Oliver Barre, MD 08/29/2023 8:19 PM [facility]

## 2023-08-28 NOTE — Patient Instructions (Addendum)
 You had the steroid shot today  Please take all new medication as prescribed -the prednisone  Please continue all other medications as before, and refills have been done if requested.  Please have the pharmacy call with any other refills you may need.  Please continue your efforts at being more active, low cholesterol diet, and weight control.  You are otherwise up to date with prevention measures today.  Please keep your appointments with your specialists as you may have planned  You will be contacted regarding the referral for: Eye doctor (Dr Dione Booze)  Please make an Appointment to return in 6 months, or sooner if needed, also with Lab Appointment for testing done 3-5 days before at the FIRST FLOOR Lab (so this is for TWO appointments - please see the scheduling desk as you leave)

## 2023-08-29 ENCOUNTER — Encounter: Payer: Self-pay | Admitting: Internal Medicine

## 2023-08-29 LAB — URINALYSIS, ROUTINE W REFLEX MICROSCOPIC
Bilirubin Urine: NEGATIVE
Hgb urine dipstick: NEGATIVE
Ketones, ur: NEGATIVE
Leukocytes,Ua: NEGATIVE
Nitrite: NEGATIVE
Specific Gravity, Urine: 1.02 (ref 1.000–1.030)
Total Protein, Urine: NEGATIVE
Urine Glucose: >=1000 — AB
Urobilinogen, UA: 0.2 (ref 0.0–1.0)
pH: 6 (ref 5.0–8.0)

## 2023-08-29 NOTE — Assessment & Plan Note (Addendum)
 Lab Results  Component Value Date   HGBA1C 6.7 (H) 08/25/2023   Stable, pt to continue current medical treatment farxiga 5 mg every day, metformin ER 500 mg - 2 bid, and recent ozempic 0.5 mg weekly; refer optho

## 2023-08-29 NOTE — Assessment & Plan Note (Signed)
 BP Readings from Last 3 Encounters:  08/28/23 112/66  08/21/23 118/76  07/10/23 110/80   Stable, pt to continue medical treatment coreg 25 bid

## 2023-08-29 NOTE — Assessment & Plan Note (Signed)
 Mild to mod, for depomedrol 80 mg IM, prednisone taper,,  to f/u any worsening symptoms or concerns

## 2023-08-29 NOTE — Assessment & Plan Note (Signed)
 Lab Results  Component Value Date   LDLCALC 83 08/25/2023   Uncontrolled,, pt to continue current statin lipitor 80 mg every day zetia 10 every day, declines add repatha

## 2023-08-29 NOTE — Assessment & Plan Note (Signed)
 Last vitamin D Lab Results  Component Value Date   VD25OH 41.40 08/25/2023   Stable, cont oral replacement

## 2023-08-29 NOTE — Assessment & Plan Note (Signed)
Age and sex appropriate education and counseling updated with regular exercise and diet ?Referrals for preventative services - for optho referral ?Immunizations addressed - none needed ?Smoking counseling  - none needed ?Evidence for depression or other mood disorder - none significant ?Most recent labs reviewed. ?I have personally reviewed and have noted: ?1) the patient's medical and social history ?2) The patient's current medications and supplements ?3) The patient's height, weight, and BMI have been recorded in the chart ? ?

## 2023-08-30 ENCOUNTER — Other Ambulatory Visit (HOSPITAL_COMMUNITY): Payer: Self-pay

## 2023-08-31 ENCOUNTER — Encounter: Payer: Self-pay | Admitting: *Deleted

## 2023-08-31 ENCOUNTER — Encounter: Payer: Self-pay | Admitting: Cardiology

## 2023-08-31 ENCOUNTER — Encounter: Payer: Self-pay | Admitting: Internal Medicine

## 2023-08-31 NOTE — Telephone Encounter (Signed)
 Out download says over the past month you have been using your CPAP 4 hours or more for 63% of nights. You have missed wearing it a few nights, and the rest are nights where you have worn it less than 4 hours. Try to bring that use up to get to the 70% goal.

## 2023-09-17 DIAGNOSIS — R112 Nausea with vomiting, unspecified: Secondary | ICD-10-CM | POA: Diagnosis not present

## 2023-09-17 DIAGNOSIS — R051 Acute cough: Secondary | ICD-10-CM | POA: Diagnosis not present

## 2023-09-17 DIAGNOSIS — J019 Acute sinusitis, unspecified: Secondary | ICD-10-CM | POA: Diagnosis not present

## 2023-09-17 DIAGNOSIS — I509 Heart failure, unspecified: Secondary | ICD-10-CM | POA: Diagnosis not present

## 2023-09-18 ENCOUNTER — Other Ambulatory Visit (HOSPITAL_COMMUNITY): Payer: Self-pay

## 2023-09-18 MED ORDER — BUDESONIDE-FORMOTEROL FUMARATE 160-4.5 MCG/ACT IN AERO
2.0000 | INHALATION_SPRAY | Freq: Two times a day (BID) | RESPIRATORY_TRACT | 12 refills | Status: AC
  Filled 2023-09-18: qty 10.2, 30d supply, fill #0

## 2023-09-21 ENCOUNTER — Encounter: Payer: Self-pay | Admitting: Internal Medicine

## 2023-09-21 ENCOUNTER — Ambulatory Visit: Admitting: Internal Medicine

## 2023-09-21 ENCOUNTER — Other Ambulatory Visit (HOSPITAL_COMMUNITY): Payer: Self-pay

## 2023-09-21 VITALS — BP 116/80 | HR 78 | Ht 69.0 in | Wt 365.8 lb

## 2023-09-21 DIAGNOSIS — J410 Simple chronic bronchitis: Secondary | ICD-10-CM

## 2023-09-21 DIAGNOSIS — J309 Allergic rhinitis, unspecified: Secondary | ICD-10-CM | POA: Diagnosis not present

## 2023-09-21 NOTE — Patient Instructions (Addendum)
 It was a pleasure to see you today!  Please schedule follow up with Dr. Maple Hudson or APP in 4 months.  If my schedule is not open yet, we will contact you with a reminder closer to that time. Please call [phone] if you haven't heard from [place] a month before, and always call us sooner if issues or concerns arise. You can also send [place] a message through MyChart, but but aware that this is not to be used for urgent issues and it may take up to 5-7 days to receive a reply. Please be aware that you will likely be able to view your results before I have a chance to respond to them. Please give [place] 5 business days to respond to any non-urgent results.   I think your cough is related to post nasal drainage.   Start taking Flonase again 1 spray each side your nose twice a day. Start taking Xyzal which is the antihistamine to help with his allergies.  You can take the Symbicort 2 puffs in the morning 2 puffs at night, gargle after use.  In between there if you get short of breath you can take the albuterol inhaler as needed.  The allergies can make the asthma worse and make sure you stay on top of it.  Flonase - 1 spray on each side of your nose twice a day for first week, then 1 spray on each side.   Instructions for use: If you also use a saline nasal spray or rinse, use that first. Position the head with the chin slightly tucked. Use the right hand to spray into the left nostril and the right hand to spray into the left nostril.   Point the bottle away from the septum of your nose (cartilage that divides the two sides of your nose).  Hold the nostril closed on the opposite side from where you will spray Spray once and gently sniff to pull the medicine into the higher parts of your nose.  Don't sniff too hard as the medicine will drain down the back of your throat instead. Repeat with a second spray on the same side if prescribed. Repeat on the other side of your nose.'

## 2023-09-21 NOTE — Progress Notes (Signed)
 Tyler [name]    [id number]    01-29-1976  Primary Care Physician:John, Len Blalock, MD Date of Appointment: 09/21/2023 Established Patient Visit  Chief complaint:   Chief Complaint  Patient presents with   Follow-up    Coughing is getting a little bit better and sob. Started Saturday.      HPI: Tyler [name] is a 48 y.o. man with OSA on CPAP, CHF, chronic rhinitis.  Interval Updates: Follows with Dr. Maple Hudson for OSA and takes symbicort for chronic bronchitis.   Here for an acute visit.  Went to urgent care and was told it was allergies and covid/flu negative.  They gave him ipratropium and augmentin.  Coughing at night. Having post nasal drip He says the medications I gave him back in January helped a lot but he didn't know he was supposed to keep taking them.  He was given a symbicort inhaler for shortness of breath. It seems to be helping.  He also has an albuterol inhaler which helps his breathing.  I have reviewed the patient's family social and past medical history and updated as appropriate.   Past Medical History:  Diagnosis Date   ACHILLES TENDINITIS, BILATERAL 10/16/2008   ALLERGIC RHINITIS 08/13/2009   CARDIOMYOPATHY 04/21/2009   CHF (congestive heart failure) (HCC)    CONGESTIVE HEART FAILURE 12/04/2007   CORONARY ARTERY DISEASE 12/04/2007   GERD (gastroesophageal reflux disease) 11/01/2011   HYPERLIPIDEMIA 03/12/2007   HYPERTENSION 03/12/2007   OBESITY 04/21/2009   Plantar fasciitis, bilateral 11/01/2011   SLEEP APNEA 03/12/2007    Past Surgical History:  Procedure Laterality Date   CARDIAC CATHETERIZATION N/A 12/19/2014   Procedure: Left Heart Cath and Coronary Angiography;  Surgeon: Lyn Records, MD;  Location: [facility] INVASIVE CV LAB;  Service: Cardiovascular;  Laterality: N/A;   COLONOSCOPY WITH PROPOFOL N/A 11/19/2020   Procedure: COLONOSCOPY WITH PROPOFOL;  Surgeon: Rachael Fee, MD;  Location: [facility] ENDOSCOPY;  Service: Endoscopy;  Laterality:  N/A;   NO PAST SURGERIES      Family History  Problem Relation Age of Onset   Hypertension Other    Heart attack Mother    Heart disease Mother    Diabetes Mother    Heart attack Father    Heart disease Father    Diabetes Father    Asthma Brother    Diabetes Maternal Grandfather     Social History   Occupational History   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  Gen:      No distress, obese ENT:  +cobblestoning in oropharynx Lungs:    no increased work of breathing ctab. No wheeze CV:         RRR no mrg   Data Reviewed: Imaging: I have personally reviewed the   PFTs:     Latest Ref Rng & Units 12/17/2014   11:50 AM  PFT Results  FVC-Pre L 3.71   FVC-Predicted Pre % 88   FVC-Post L  3.96   FVC-Predicted Post % 93   Pre FEV1/FVC % % 82   Post FEV1/FCV % % 79   FEV1-Pre L 3.03   FEV1-Predicted Pre % 87   FEV1-Post L 3.14   DLCO uncorrected ml/min/mmHg 33.90   DLCO UNC% % 109   DLVA Predicted % 128   TLC L 5.45   TLC % Predicted % 81   RV % Predicted % 101    I have personally reviewed the patient's PFTs and normal pulmonary function  Labs: Lab Results  Component Value Date   WBC 7.9 08/25/2023   HGB 13.9 08/25/2023   HCT 43.0 08/25/2023   MCV 88.2 08/25/2023   PLT 223.0 08/25/2023   Lab Results  Component Value Date   NA 141 08/25/2023   K 4.1 08/25/2023   CL 106 08/25/2023   CO2 24 08/25/2023    Immunization status: Immunization History  Administered Date(s) Administered   Influenza Whole 04/01/2009, 03/09/2010   Influenza, Seasonal,  Injecte, Preservative Fre 02/27/2023   Influenza,inj,Quad PF,6+ Mos 04/27/2013, 02/11/2014, 06/09/2015, 04/05/2016, 07/18/2017, 06/22/2018, 04/06/2019, 03/25/2021, 02/28/2022   PFIZER(Purple Top)SARS-COV-2 Vaccination 08/29/2019, 09/19/2019, 05/12/2020   PNEUMOCOCCAL CONJUGATE-20 01/24/2022   Pneumococcal Polysaccharide-23 10/21/2014   Td 10/16/2008   Tdap 08/28/2018    External Records Personally Reviewed: pulmonary, pcp  Assessment:  Chronic cough Most likely secondary to seasonal allergic rhinitis Mild persistent asthma without exacerbation  Plan/Recommendations: I think your cough is related to post nasal drainage.   Start taking Flonase again 1 spray each side your nose twice a day. Start taking Xyzal which is the antihistamine to help with his allergies.  You can take the Symbicort 2 puffs in the morning 2 puffs at night, gargle after use.  In between there if you get short of breath you can take the albuterol inhaler as needed.  The allergies can make the asthma worse and make sure you stay on top of it.  Return to Care: Return in about 4 months (around 01/21/2024).    Durel Salts, MD Pulmonary and Critical Care Medicine [facility] Office:[phone]

## 2023-10-10 ENCOUNTER — Other Ambulatory Visit: Payer: Self-pay

## 2023-10-11 ENCOUNTER — Other Ambulatory Visit (HOSPITAL_COMMUNITY): Payer: Self-pay

## 2023-10-23 ENCOUNTER — Ambulatory Visit: Payer: Federal, State, Local not specified - PPO | Admitting: Internal Medicine

## 2023-10-28 ENCOUNTER — Other Ambulatory Visit: Payer: Self-pay | Admitting: Cardiology

## 2023-10-28 ENCOUNTER — Other Ambulatory Visit: Payer: Self-pay | Admitting: Internal Medicine

## 2023-10-28 DIAGNOSIS — E1159 Type 2 diabetes mellitus with other circulatory complications: Secondary | ICD-10-CM

## 2023-11-07 ENCOUNTER — Encounter: Payer: Self-pay | Admitting: Cardiology

## 2023-11-08 NOTE — Telephone Encounter (Signed)
  Pt is calling to follow up about her medication

## 2023-12-02 ENCOUNTER — Other Ambulatory Visit (HOSPITAL_COMMUNITY): Payer: Self-pay

## 2023-12-20 ENCOUNTER — Other Ambulatory Visit (HOSPITAL_COMMUNITY): Payer: Self-pay

## 2024-01-01 ENCOUNTER — Ambulatory Visit: Admitting: Internal Medicine

## 2024-01-01 ENCOUNTER — Other Ambulatory Visit (HOSPITAL_COMMUNITY): Payer: Self-pay

## 2024-01-01 ENCOUNTER — Encounter: Payer: Self-pay | Admitting: Internal Medicine

## 2024-01-01 VITALS — BP 118/78 | HR 71 | Ht 69.0 in | Wt 345.6 lb

## 2024-01-01 DIAGNOSIS — E785 Hyperlipidemia, unspecified: Secondary | ICD-10-CM

## 2024-01-01 DIAGNOSIS — E1165 Type 2 diabetes mellitus with hyperglycemia: Secondary | ICD-10-CM

## 2024-01-01 DIAGNOSIS — E1159 Type 2 diabetes mellitus with other circulatory complications: Secondary | ICD-10-CM | POA: Diagnosis not present

## 2024-01-01 DIAGNOSIS — Z7984 Long term (current) use of oral hypoglycemic drugs: Secondary | ICD-10-CM

## 2024-01-01 DIAGNOSIS — Z7985 Long-term (current) use of injectable non-insulin antidiabetic drugs: Secondary | ICD-10-CM

## 2024-01-01 LAB — POCT GLYCOSYLATED HEMOGLOBIN (HGB A1C): Hemoglobin A1C: 6.1 % — AB (ref 4.0–5.6)

## 2024-01-01 MED ORDER — SEMAGLUTIDE(0.25 OR 0.5MG/DOS) 2 MG/3ML ~~LOC~~ SOPN
0.5000 mg | PEN_INJECTOR | SUBCUTANEOUS | 3 refills | Status: AC
  Filled 2024-01-01: qty 6, 56d supply, fill #0
  Filled 2024-02-26: qty 3, 28d supply, fill #1
  Filled 2024-03-30: qty 3, 28d supply, fill #2
  Filled 2024-05-06: qty 3, 28d supply, fill #3
  Filled 2024-06-07: qty 3, 28d supply, fill #4
  Filled 2024-07-05: qty 3, 28d supply, fill #5

## 2024-01-01 NOTE — Patient Instructions (Addendum)
 Please continue: - Metformin  ER 1000 mg 2x a day - Farxiga  5 mg before breakfast - Ozempic  0.5 mg weekly    Please start the sensor.  STOP SWEET DRINKS!  Please return for another visit in 3-4 months.

## 2024-01-01 NOTE — Progress Notes (Signed)
 Patient ID: Jousha Schwandt, male   DOB: 1975-07-02, 48 y.o.   MRN: [id number]  HPI: Destry Dauber is a 48 y.o.-year-old male, returning for follow-up for DM2, dx in 2013, non-insulin-dependent, uncontrolled, with complications (CAD, CHF, mild CKD). Pt. previously saw Dr. Kassie, but last visit with me 6.5 months ago.  Interim history: No increased urination, blurry vision, nausea, chest pain.  He prev. had diarrhea with metformin  so he divided the dose to twice a day. He still drinks sweet drinks. He obtained a CGM since last visit but did not attach it.  Reviewed HbA1c: Lab Results  Component Value Date   HGBA1C 6.7 (H) 08/25/2023   HGBA1C 6.5 (A) 06/19/2023   HGBA1C 6.4 (A) 02/13/2023   HGBA1C 6.7 (H) 08/29/2022   HGBA1C 6.3 (A) 06/06/2022   HGBA1C 7.4 (A) 02/14/2022   HGBA1C 6.9 (A) 10/18/2021   HGBA1C 7.2 (A) 07/19/2021   HGBA1C 6.2 (A) 04/12/2021   HGBA1C 6.1 (A) 01/11/2021   Pt is on a regimen of: - Metformin  ER 2000 in am >> 1000 mg 2x a day - Farxiga  5 mg before breakfast - Rybelsus  14 mg before breakfast >> 14 mg 2x a day >> 28 mg before breakfast >> Ozempic  0.5 mg weekly He had diarrhea with regular metformin .  Pt checks his sugars 0-1x a day and they are: - am: 90-102 >> 105-115 >> 100-120s >> 89-115 - 2h after b'fast: n/c - before lunch: n/c - 2h after lunch: n/c >> 105 >> n/c - before dinner: n/c >> 100 >> n/c - 2h after dinner: n/c - bedtime: n/c - nighttime: n/c Lowest: 88 Highest: 120  Glucometer: Freestyle Lite  He was previously drinking 1 Pepsi a day.  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  He mentioned that water caused muscle cramps.  I strongly advised him to stop sweet drinks.  Now drinks 2 a week.  - + Mild CKD, last BUN/creatinine:  Lab Results  Component Value Date   BUN 16 08/25/2023   BUN 14 01/18/2023   CREATININE 0.99 08/25/2023   CREATININE 1.02 01/18/2023   Lab Results  Component Value Date   MICRALBCREAT 5.3  08/28/2023  He is on Entresto .  -+ HL; last set of lipids: Lab Results  Component Value Date   CHOL 140 08/25/2023   HDL 36.20 (L) 08/25/2023   LDLCALC 83 08/25/2023   LDLDIRECT 183.6 08/02/2011   TRIG 104.0 08/25/2023   CHOLHDL 4 08/25/2023  On Lipitor 80 mg daily and Zetia  10 mg daily.  - last eye exam was 05/09/2022: No DR.  - no numbness and tingling in his feet.  Last foot exam 06/19/2023.  He also has a history of HTN, GERD, sleep apnea, asthmatic bronchitis.  He has a Doctor, general practice.  ROS: + see HPI  Past Medical History:  Diagnosis Date   ACHILLES TENDINITIS, BILATERAL 10/16/2008   ALLERGIC RHINITIS 08/13/2009   CARDIOMYOPATHY 04/21/2009   CHF (congestive heart failure) (HCC)    CONGESTIVE HEART FAILURE 12/04/2007   CORONARY ARTERY DISEASE 12/04/2007   GERD (gastroesophageal reflux disease) 11/01/2011   HYPERLIPIDEMIA 03/12/2007   HYPERTENSION 03/12/2007   OBESITY 04/21/2009   Plantar fasciitis, bilateral 11/01/2011   SLEEP APNEA 03/12/2007   Past Surgical History:  Procedure Laterality Date   CARDIAC CATHETERIZATION N/A 12/19/2014   Procedure: Left Heart Cath and Coronary Angiography;  Surgeon: Victory LELON Sharps, MD;  Location: [facility] INVASIVE CV LAB;  Service: Cardiovascular;  Laterality: N/A;   COLONOSCOPY WITH PROPOFOL  N/A 11/19/2020   Procedure: COLONOSCOPY WITH PROPOFOL ;  Surgeon: Teressa Toribio SQUIBB, MD;  Location: [facility] ENDOSCOPY;  Service: Endoscopy;  Laterality: N/A;   NO PAST SURGERIES     Social History   Socioeconomic History   Marital status: Married    Spouse name: Not on file   Number of children: 3   Years of education: Not on file   Highest education level: Not on file  Occupational History   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[employer]    Employer: [employer]  Tobacco Use   Smoking status: Never   Smokeless tobacco: Never   Tobacco comments:    never  Vaping Use   Vaping status: Never Used  Substance and Sexual Activity    Alcohol use: No   Drug use: No   Sexual activity: Not on file  Other Topics Concern   Not on file  Social History Narrative   Not on file   Social Drivers of Health   Financial Resource Strain: Not on file  Food Insecurity: Not on file  Transportation Needs: Not on file  Physical Activity: Not on file  Stress: Not on file  Social Connections: Not on file  Intimate Partner Violence: Not on file   Current Outpatient Medications on File Prior to Visit  Medication Sig Dispense Refill   albuterol  (VENTOLIN  HFA) 108 (90 Base) MCG/ACT inhaler Inhale 2 puffs into the lungs every 6 (six) hours as needed for wheezing. 6.7 g 11   aspirin  EC 81 MG tablet Take 81 mg by mouth daily.     atorvastatin  (LIPITOR) 80 MG tablet Take 1 tablet by mouth once daily 90 tablet 1   Blood Glucose Monitoring Suppl (FREESTYLE LITE) w/Device KIT Use daily as directed to test blood glucose. 1 kit 3   Blood Glucose Monitoring Suppl (ONETOUCH VERIO REFLECT) w/Device KIT Use as advised 1 kit 0   budesonide -formoterol  (SYMBICORT ) 160-4.5 MCG/ACT inhaler INHALE 2 PUFFS BY MOUTH TWICE A DAY 10.2 g 12   carvedilol  (COREG ) 25 MG tablet TAKE 1 TABLET BY MOUTH TWICE DAILY WITH A MEAL 180 tablet 3   chlorpheniramine-HYDROcodone  (TUSSIONEX) 10-8 MG/5ML Take 5 mLs by mouth every 12 (twelve) hours as needed for cough. 70 mL 0   Cholecalciferol (VITAMIN D ) 50 MCG (2000 UT) tablet Take 2,000 Units by mouth daily.     Continuous Glucose Sensor (FREESTYLE LIBRE 3 PLUS SENSOR) MISC 1 each by Does not apply route every 14 (fourteen) days. 6 each 3   dapagliflozin  propanediol (FARXIGA ) 5 MG TABS tablet Take 1 tablet (5 mg total) by mouth daily before breakfast. 90 tablet 3   ezetimibe  (ZETIA ) 10 MG tablet Take 1 tablet by mouth once daily 90 tablet 3   fluticasone  (FLONASE ) 50 MCG/ACT nasal spray Place 1 spray into both nostrils daily. 16 g 3   furosemide  (LASIX ) 20 MG tablet Take 1 tablet by mouth once daily 90 tablet 1   glucose  blood (FREESTYLE LITE) test strip Use 1 strip to check blood glucose once daily as directed. 50 each 5   Lancets (FREESTYLE) lancets Use 1 lancet once daily as directed to test blood glucose. 100 each 12   Lancets MISC Use as directed twice per day E11.9 200 each 12   levocetirizine (XYZAL ) 5 MG tablet Take 1 tablet (5 mg total) by mouth every evening. 30 tablet 11   metFORMIN  (GLUCOPHAGE -XR) 500 MG 24 hr tablet TAKE 2 TABLETS BY MOUTH  TWICE DAILY WITH MEALS 360 tablet 0   Multiple Vitamins-Minerals (MULTIVITAMIN WITH MINERALS) tablet Take 1 tablet by mouth daily.     nitroGLYCERIN  (NITROSTAT ) 0.4 MG SL tablet Place 1 tablet (0.4 mg total) under the tongue every 5 (five) minutes as needed for chest pain. 25 tablet 3   OneTouch Delica Lancets 33G MISC Use once a day 100 each 3   PRESCRIPTION MEDICATION Inhale into the lungs at bedtime. CPAP     sacubitril -valsartan  (ENTRESTO ) 97-103 MG Take 1 tablet by mouth 2 times daily. 180 tablet 3   Semaglutide ,0.25 or 0.5MG /DOS, 2 MG/3ML SOPN Inject 0.5 mg into the skin once a week. 6 mL 2   spironolactone  (ALDACTONE ) 25 MG tablet Take 1 tablet by mouth once daily 90 tablet 1   No current facility-administered medications on file prior to visit.   No Known Allergies Family History  Problem Relation Age of Onset   Hypertension Other    Heart attack Mother    Heart disease Mother    Diabetes Mother    Heart attack Father    Heart disease Father    Diabetes Father    Asthma Brother    Diabetes Maternal Grandfather    PE: BP 118/78   Pulse 71   Ht 5' 9 (1.753 m)   Wt (!) 345 lb 9.6 oz (156.8 kg)   SpO2 96%   BMI 51.04 kg/m  Wt Readings from Last 10 Encounters:  01/01/24 (!) 345 lb 9.6 oz (156.8 kg)  09/21/23 (!) 365 lb 12.8 oz (165.9 kg)  08/28/23 (!) 371 lb 6 oz (168.5 kg)  08/21/23 (!) 372 lb 6.4 oz (168.9 kg)  07/10/23 (!) 369 lb (167.4 kg)  06/19/23 (!) 367 lb 3.2 oz (166.6 kg)  05/15/23 (!) 366 lb (166 kg)  02/27/23 (!) 361 lb 2 oz  (163.8 kg)  02/13/23 (!) 369 lb (167.4 kg)  01/30/23 (!) 368 lb 9.6 oz (167.2 kg)   Constitutional: overweight, in NAD Eyes: no exophthalmos ENT: no thyromegaly, no cervical lymphadenopathy Cardiovascular: RRR, No MRG, + pitting LE edema B Respiratory: CTA B Musculoskeletal: no deformities Skin:no rashes Neurological: no tremor with outstretched hands  ASSESSMENT: 1. DM2, non-insulin-dependent, uncontrolled, with complications - CAD - CHF - Nonischemic cardiomyopathy  2. HL  3.  Obesity class III  PLAN:  1. Patient with longstanding, uncontrolled, type 2 diabetes, on oral antidiabetic regimen with metformin , SGLT2 inhibitor and previously p.o., now subcu GLP-1 receptor agonist.  His HbA1c was slightly higher at last visit, at 6.5% but he had another HbA1c obtained 4 months ago and this was even higher, at 6.7%. - At last visit, sugars were at goal despite the holidays.  He did not have side effects of the double dose of Rybelsus  (28 mg daily) but he was splitting the dose into 1 tablet before breakfast and 1 tablet before 4 going to the.  I advised him to move the entire dose with breakfast.  We did not change his regimen otherwise.  He did previously had diarrhea, but this had resolved. -At today's visit, he mentions that he is doing well on Ozempic , tolerating it well.  He lost more than 20 pounds since last visit.  He is still drinking sweet drinks, which extra again advised him to stop.  He has his freestyle libre 3 sensor with him and we will attach this for him today.  We also showed him how to download the application to read the device.  Otherwise, I did  not suggest any changes in his regimen. - I suggested to:  Patient Instructions  Please continue: - Metformin  ER 1000 mg 2x a day - Farxiga  5 mg before breakfast - Ozempic  0.5 mg weekly    Please start the sensor.  STOP SWEET DRINKS!  Please return for another visit in 3-4 months.  - we checked his HbA1c: 6.1%  (lower) - advised to check sugars at different times of the day - 4x a day, rotating check times - advised for yearly eye exams >> he is not UTD - return to clinic in 3-4 months  2. HL - Latest lipid panel was reviewed from 08/2023: LDL above our target of less than 55, but much improved from baseline, HDL low, triglycerides at goal: Lab Results  Component Value Date   CHOL 140 08/25/2023   HDL 36.20 (L) 08/25/2023   LDLCALC 83 08/25/2023   LDLDIRECT 183.6 08/02/2011   TRIG 104.0 08/25/2023   CHOLHDL 4 08/25/2023  -He continues on Lipitor 80 mg daily and Zetia  10 mg daily without side effects  3.  Obesity class III - will continue the SGLT2 inhibitor and GLP-1 receptor agonist, which should also help with weight loss.  Will change from Rybelsus  to Ozempic  since last visit per insurance preference. - He lost 2 pounds before last visit but lost 22 pounds since then!  He is wondering whether he should increase the dose or not, but I encouraged him to continue on the same dose  Quetzal Meany, MD PhD [facility]

## 2024-01-03 ENCOUNTER — Other Ambulatory Visit (HOSPITAL_COMMUNITY): Payer: Self-pay

## 2024-01-03 ENCOUNTER — Other Ambulatory Visit (HOSPITAL_BASED_OUTPATIENT_CLINIC_OR_DEPARTMENT_OTHER): Payer: Self-pay

## 2024-01-30 ENCOUNTER — Ambulatory Visit: Payer: Federal, State, Local not specified - PPO | Admitting: Internal Medicine

## 2024-02-01 ENCOUNTER — Other Ambulatory Visit (HOSPITAL_COMMUNITY): Payer: Self-pay

## 2024-02-01 ENCOUNTER — Other Ambulatory Visit (HOSPITAL_BASED_OUTPATIENT_CLINIC_OR_DEPARTMENT_OTHER): Payer: Self-pay

## 2024-02-01 ENCOUNTER — Other Ambulatory Visit: Payer: Self-pay | Admitting: Cardiology

## 2024-02-02 ENCOUNTER — Other Ambulatory Visit (HOSPITAL_COMMUNITY): Payer: Self-pay

## 2024-02-02 ENCOUNTER — Other Ambulatory Visit (HOSPITAL_BASED_OUTPATIENT_CLINIC_OR_DEPARTMENT_OTHER): Payer: Self-pay

## 2024-02-02 MED ORDER — SACUBITRIL-VALSARTAN 97-103 MG PO TABS
1.0000 | ORAL_TABLET | Freq: Two times a day (BID) | ORAL | 1 refills | Status: AC
  Filled 2024-02-02: qty 180, 90d supply, fill #0
  Filled 2024-05-23 – 2024-05-26 (×2): qty 180, 90d supply, fill #1

## 2024-02-25 ENCOUNTER — Other Ambulatory Visit: Payer: Self-pay | Admitting: Internal Medicine

## 2024-02-25 ENCOUNTER — Other Ambulatory Visit: Payer: Self-pay | Admitting: Cardiology

## 2024-02-25 DIAGNOSIS — E1165 Type 2 diabetes mellitus with hyperglycemia: Secondary | ICD-10-CM

## 2024-02-25 DIAGNOSIS — E785 Hyperlipidemia, unspecified: Secondary | ICD-10-CM

## 2024-02-26 ENCOUNTER — Other Ambulatory Visit (HOSPITAL_COMMUNITY): Payer: Self-pay

## 2024-02-26 ENCOUNTER — Ambulatory Visit: Payer: Self-pay | Admitting: Internal Medicine

## 2024-02-26 ENCOUNTER — Ambulatory Visit: Admitting: Internal Medicine

## 2024-02-26 ENCOUNTER — Other Ambulatory Visit: Payer: Self-pay | Admitting: Internal Medicine

## 2024-02-26 VITALS — BP 120/86 | HR 94 | Temp 98.4°F | Ht 69.0 in | Wt 351.0 lb

## 2024-02-26 DIAGNOSIS — E559 Vitamin D deficiency, unspecified: Secondary | ICD-10-CM | POA: Diagnosis not present

## 2024-02-26 DIAGNOSIS — E1165 Type 2 diabetes mellitus with hyperglycemia: Secondary | ICD-10-CM

## 2024-02-26 DIAGNOSIS — R109 Unspecified abdominal pain: Secondary | ICD-10-CM | POA: Diagnosis not present

## 2024-02-26 DIAGNOSIS — Z23 Encounter for immunization: Secondary | ICD-10-CM | POA: Diagnosis not present

## 2024-02-26 DIAGNOSIS — Z7985 Long-term (current) use of injectable non-insulin antidiabetic drugs: Secondary | ICD-10-CM

## 2024-02-26 DIAGNOSIS — H6123 Impacted cerumen, bilateral: Secondary | ICD-10-CM

## 2024-02-26 DIAGNOSIS — E1159 Type 2 diabetes mellitus with other circulatory complications: Secondary | ICD-10-CM

## 2024-02-26 DIAGNOSIS — Z7984 Long term (current) use of oral hypoglycemic drugs: Secondary | ICD-10-CM

## 2024-02-26 DIAGNOSIS — E785 Hyperlipidemia, unspecified: Secondary | ICD-10-CM | POA: Diagnosis not present

## 2024-02-26 DIAGNOSIS — I10 Essential (primary) hypertension: Secondary | ICD-10-CM

## 2024-02-26 LAB — HEPATIC FUNCTION PANEL
ALT: 18 U/L (ref 0–53)
AST: 16 U/L (ref 0–37)
Albumin: 4 g/dL (ref 3.5–5.2)
Alkaline Phosphatase: 77 U/L (ref 39–117)
Bilirubin, Direct: 0.1 mg/dL (ref 0.0–0.3)
Total Bilirubin: 0.5 mg/dL (ref 0.2–1.2)
Total Protein: 7.2 g/dL (ref 6.0–8.3)

## 2024-02-26 LAB — BASIC METABOLIC PANEL WITH GFR
BUN: 12 mg/dL (ref 6–23)
CO2: 29 meq/L (ref 19–32)
Calcium: 9.1 mg/dL (ref 8.4–10.5)
Chloride: 103 meq/L (ref 96–112)
Creatinine, Ser: 0.98 mg/dL (ref 0.40–1.50)
GFR: 90.09 mL/min (ref >60.00)
Glucose, Bld: 101 mg/dL — ABNORMAL HIGH (ref 70–99)
Potassium: 3.9 meq/L (ref 3.5–5.1)
Sodium: 139 meq/L (ref 135–145)

## 2024-02-26 LAB — LIPID PANEL
Cholesterol: 142 mg/dL (ref 0–200)
HDL: 36.6 mg/dL — ABNORMAL LOW (ref >39.00)
LDL Cholesterol: 80 mg/dL (ref 0–99)
NonHDL: 105.13
Total CHOL/HDL Ratio: 4
Triglycerides: 127 mg/dL (ref 0.0–149.0)
VLDL: 25.4 mg/dL (ref 0.0–40.0)

## 2024-02-26 LAB — VITAMIN D 25 HYDROXY (VIT D DEFICIENCY, FRACTURES): VITD: 32.4 ng/mL (ref 30.00–100.00)

## 2024-02-26 LAB — HEMOGLOBIN A1C: Hgb A1c MFr Bld: 6.4 % (ref 4.6–6.5)

## 2024-02-26 MED ORDER — CYCLOBENZAPRINE HCL 5 MG PO TABS
5.0000 mg | ORAL_TABLET | Freq: Three times a day (TID) | ORAL | 1 refills | Status: DC | PRN
  Filled 2024-02-26: qty 40, 14d supply, fill #0

## 2024-02-26 NOTE — Patient Instructions (Addendum)
 Please take all new medication as prescribed - the muscle relaxer for the left lower back strain  Ok to continue the advil  as needed  You had the flu shot today  Your ears were treated today  Please continue all other medications as before, and refills have been done if requested.  Please have the pharmacy call with any other refills you may need.  Please keep your appointments with your specialists as you may have planned - Dr Vianne  We can hold on more lab testing today  Please make an Appointment to return in 6 months, or sooner if needed

## 2024-02-26 NOTE — Progress Notes (Unsigned)
 Patient ID: Tyler [name], male   DOB: 07/30/1975, 48 y.o.   MRN: [id number]        Chief Complaint: follow up left flank lower back pain, hld, htn, dm, low vit d       HPI:  Tyler [name] is a 48 y.o. male here with acute onset yesterday of mod to severe intermittent left flank and left lower back pain, Denies urinary symptoms such as dysuria, frequency, urgency, flank pain, hematuria or n/v, fever, chills.   Pt denies polydipsia, polyuria, or new focal neuro s/s.   Advil  has helped with the pain and does not want further pain med.  Lost wt with ozempic  and plans to continue.  Also has bilateral ear canal wax obstructions Wt Readings from Last 3 Encounters:  02/26/24 (!) 351 lb (159.2 kg)  01/01/24 (!) 345 lb 9.6 oz (156.8 kg)  09/21/23 (!) 365 lb 12.8 oz (165.9 kg)   BP Readings from Last 3 Encounters:  02/26/24 120/86  01/01/24 118/78  09/21/23 116/80         Past Medical History:  Diagnosis Date   ACHILLES TENDINITIS, BILATERAL 10/16/2008   ALLERGIC RHINITIS 08/13/2009   CARDIOMYOPATHY 04/21/2009   CHF (congestive heart failure) (HCC)    CONGESTIVE HEART FAILURE 12/04/2007   CORONARY ARTERY DISEASE 12/04/2007   GERD (gastroesophageal reflux disease) 11/01/2011   HYPERLIPIDEMIA 03/12/2007   HYPERTENSION 03/12/2007   OBESITY 04/21/2009   Plantar fasciitis, bilateral 11/01/2011   SLEEP APNEA 03/12/2007   Past Surgical History:  Procedure Laterality Date   CARDIAC CATHETERIZATION N/A 12/19/2014   Procedure: Left Heart Cath and Coronary Angiography;  Surgeon: Victory LELON Sharps, MD;  Location: [facility] INVASIVE CV LAB;  Service: Cardiovascular;  Laterality: N/A;   COLONOSCOPY WITH PROPOFOL  N/A 11/19/2020   Procedure: COLONOSCOPY WITH PROPOFOL ;  Surgeon: Teressa Toribio SQUIBB, MD;  Location: [facility] ENDOSCOPY;  Service: Endoscopy;  Laterality: N/A;   NO PAST SURGERIES      reports that he has never smoked. He has never used smokeless tobacco. He reports that he does not drink alcohol and does not use  drugs. family history includes Asthma in his brother; Diabetes in his father, maternal grandfather, and mother; Heart attack in his father and mother; Heart disease in his father and mother; Hypertension in an other family member. No Known Allergies Current Outpatient Medications on File Prior to Visit  Medication Sig Dispense Refill   albuterol  (VENTOLIN  HFA) 108 (90 Base) MCG/ACT inhaler Inhale 2 puffs into the lungs every 6 (six) hours as needed for wheezing. 6.7 g 11   aspirin  EC 81 MG tablet Take 81 mg by mouth daily.     atorvastatin  (LIPITOR) 80 MG tablet Take 1 tablet by mouth once daily 90 tablet 1   Blood Glucose Monitoring Suppl (FREESTYLE LITE) w/Device KIT Use daily as directed to test blood glucose. 1 kit 3   Blood Glucose Monitoring Suppl (ONETOUCH VERIO REFLECT) w/Device KIT Use as advised 1 kit 0   budesonide -formoterol  (SYMBICORT ) 160-4.5 MCG/ACT inhaler INHALE 2 PUFFS BY MOUTH TWICE A DAY 10.2 g 12   carvedilol  (COREG ) 25 MG tablet TAKE 1 TABLET BY MOUTH TWICE DAILY WITH A MEAL 180 tablet 3   chlorpheniramine-HYDROcodone  (TUSSIONEX) 10-8 MG/5ML Take 5 mLs by mouth every 12 (twelve) hours as needed for cough. 70 mL 0   Cholecalciferol (VITAMIN D ) 50 MCG (2000 UT) tablet Take 2,000 Units by mouth daily.     Continuous Glucose Sensor (FREESTYLE LIBRE 3 PLUS  SENSOR) MISC 1 each by Does not apply route every 14 (fourteen) days. 6 each 3   dapagliflozin  propanediol (FARXIGA ) 5 MG TABS tablet Take 1 tablet (5 mg total) by mouth daily before breakfast. 90 tablet 3   ezetimibe  (ZETIA ) 10 MG tablet Take 1 tablet by mouth once daily 90 tablet 3   fluticasone  (FLONASE ) 50 MCG/ACT nasal spray Place 1 spray into both nostrils daily. 16 g 3   furosemide  (LASIX ) 20 MG tablet Take 1 tablet by mouth once daily 90 tablet 1   glucose blood (FREESTYLE LITE) test strip Use 1 strip to check blood glucose once daily as directed. 50 each 5   Lancets (FREESTYLE) lancets Use 1 lancet once daily as  directed to test blood glucose. 100 each 12   Lancets MISC Use as directed twice per day E11.9 200 each 12   levocetirizine (XYZAL ) 5 MG tablet Take 1 tablet (5 mg total) by mouth every evening. 30 tablet 11   metFORMIN  (GLUCOPHAGE -XR) 500 MG 24 hr tablet TAKE 2 TABLETS BY MOUTH TWICE DAILY WITH MEALS 360 tablet 1   Misc Natural Products (BEET ROOT PO) Take by mouth.     Multiple Vitamins-Minerals (MULTIVITAMIN WITH MINERALS) tablet Take 1 tablet by mouth daily.     nitroGLYCERIN  (NITROSTAT ) 0.4 MG SL tablet Place 1 tablet (0.4 mg total) under the tongue every 5 (five) minutes as needed for chest pain. 25 tablet 3   OneTouch Delica Lancets 33G MISC Use once a day 100 each 3   PRESCRIPTION MEDICATION Inhale into the lungs at bedtime. CPAP     sacubitril -valsartan  (ENTRESTO ) 97-103 MG Take 1 tablet by mouth 2 times daily. 180 tablet 1   Semaglutide ,0.25 or 0.5MG /DOS, 2 MG/3ML SOPN Inject 0.5 mg into the skin once a week. 6 mL 3   spironolactone  (ALDACTONE ) 25 MG tablet Take 1 tablet by mouth once daily 90 tablet 1   No current facility-administered medications on file prior to visit.        ROS:  All others reviewed and negative.  Objective        PE:  BP 120/86   Pulse 94   Temp 98.4 F (36.9 C) (Temporal)   Ht 5' 9 (1.753 m)   Wt (!) 351 lb (159.2 kg)   SpO2 95%   BMI 51.83 kg/m                 Constitutional: Pt appears in NAD               HENT: Head: NCAT.                Right Ear: External ear normal.                 Left Ear: External ear normal.                Eyes: . Pupils are equal, round, and reactive to light. Conjunctivae and EOM are normal               Nose: without d/c or deformity               Neck: Neck supple. Gross normal ROM               Cardiovascular: Normal rate and regular rhythm.                 Pulmonary/Chest: Effort normal and breath sounds without rales or wheezing.  Abd:  Soft, NT, ND, + BS, no organomegaly                Neurological: Pt is alert. At baseline orientation, motor grossly intact               Skin: Skin is warm. No rashes, no other new lesions, LE edema - trace bilateral               Psychiatric: Pt behavior is normal without agitation   Micro: none  Cardiac tracings I have personally interpreted today:  none  Pertinent Radiological findings (summarize): none   Lab Results  Component Value Date   WBC 7.9 08/25/2023   HGB 13.9 08/25/2023   HCT 43.0 08/25/2023   PLT 223.0 08/25/2023   GLUCOSE 101 (H) 02/26/2024   CHOL 142 02/26/2024   TRIG 127.0 02/26/2024   HDL 36.60 (L) 02/26/2024   LDLDIRECT 183.6 08/02/2011   LDLCALC 80 02/26/2024   ALT 18 02/26/2024   AST 16 02/26/2024   NA 139 02/26/2024   K 3.9 02/26/2024   CL 103 02/26/2024   CREATININE 0.98 02/26/2024   BUN 12 02/26/2024   CO2 29 02/26/2024   TSH 1.04 08/25/2023   PSA 0.93 08/25/2023   INR 1.1 06/11/2022   HGBA1C 6.4 02/26/2024   MICROALBUR <0.7 08/28/2023   Assessment/Plan:  Tyler [name] is a 48 y.o. Other or two or more races [6] Black or African American [2] male with  has a past medical history of ACHILLES TENDINITIS, BILATERAL (10/16/2008), ALLERGIC RHINITIS (08/13/2009), CARDIOMYOPATHY (04/21/2009), CHF (congestive heart failure) (HCC), CONGESTIVE HEART FAILURE (12/04/2007), CORONARY ARTERY DISEASE (12/04/2007), GERD (gastroesophageal reflux disease) (11/01/2011), HYPERLIPIDEMIA (03/12/2007), HYPERTENSION (03/12/2007), OBESITY (04/21/2009), Plantar fasciitis, bilateral (11/01/2011), and SLEEP APNEA (03/12/2007).  Dyslipidemia Lab Results  Component Value Date   LDLCALC 80 02/26/2024   uncontrolled, pt to continue current statin lipitor 80 mg every day, declines other change today   Essential hypertension BP Readings from Last 3 Encounters:  02/26/24 120/86  01/01/24 118/78  09/21/23 116/80   Stable, pt to continue medical treatment coreg  25 bid   Poorly controlled type 2 diabetes mellitus with  circulatory disorder (HCC) Lab Results  Component Value Date   HGBA1C 6.4 02/26/2024   Stable, pt to continue current medical treatment farxiga  5 every day, metformin  ER 500 mg - 2 tab bid, ozempic  0.5 mg weekly   Vitamin D  deficiency Last vitamin D  Lab Results  Component Value Date   VD25OH 32.40 02/26/2024   Low, to start oral replacement   Bilateral impacted cerumen Improved with treatment today,  to f/u any worsening symptoms or concerns   Left flank pain I suspect msk left lumbar paraspinal etiology, but for UA r/o hematuria, consider CT renal if abnormal, ok to continue advil  prn, and add flexeril  5 tid prn Followup: Return in about 6 months (around 08/25/2024).  Tyler Rush, MD 03/01/2024 1:11 PM [facility] Internal Medicine

## 2024-02-26 NOTE — Progress Notes (Signed)
 The test results show that your current treatment is OK, as the tests are stable.  Please continue the same plan.  There is no other need for change of treatment or further evaluation based on these results, at this time.  thanks

## 2024-02-27 LAB — URINALYSIS, ROUTINE W REFLEX MICROSCOPIC
Bilirubin Urine: NEGATIVE
Hgb urine dipstick: NEGATIVE
Ketones, ur: NEGATIVE
Leukocytes,Ua: NEGATIVE
Nitrite: NEGATIVE
RBC / HPF: NONE SEEN (ref 0–?)
Specific Gravity, Urine: 1.01 (ref 1.000–1.030)
Total Protein, Urine: NEGATIVE
Urine Glucose: >=1000 — AB
Urobilinogen, UA: 0.2 (ref 0.0–1.0)
WBC, UA: NONE SEEN (ref 0–?)
pH: 6 (ref 5.0–8.0)

## 2024-02-28 ENCOUNTER — Other Ambulatory Visit: Payer: Self-pay

## 2024-03-01 ENCOUNTER — Encounter: Payer: Self-pay | Admitting: Internal Medicine

## 2024-03-01 DIAGNOSIS — H6123 Impacted cerumen, bilateral: Secondary | ICD-10-CM | POA: Insufficient documentation

## 2024-03-01 DIAGNOSIS — R109 Unspecified abdominal pain: Secondary | ICD-10-CM | POA: Insufficient documentation

## 2024-03-01 NOTE — Assessment & Plan Note (Signed)
 Lab Results  Component Value Date   HGBA1C 6.4 02/26/2024   Stable, pt to continue current medical treatment farxiga  5 every day, metformin  ER 500 mg - 2 tab bid, ozempic  0.5 mg weekly

## 2024-03-01 NOTE — Assessment & Plan Note (Signed)
 I suspect msk left lumbar paraspinal etiology, but for UA r/o hematuria, consider CT renal if abnormal, ok to continue advil  prn, and add flexeril  5 tid prn

## 2024-03-01 NOTE — Assessment & Plan Note (Signed)
 BP Readings from Last 3 Encounters:  02/26/24 120/86  01/01/24 118/78  09/21/23 116/80   Stable, pt to continue medical treatment coreg  25 bid

## 2024-03-01 NOTE — Assessment & Plan Note (Signed)
 Last vitamin D  Lab Results  Component Value Date   VD25OH 32.40 02/26/2024   Low, to start oral replacement

## 2024-03-01 NOTE — Assessment & Plan Note (Addendum)
 Lab Results  Component Value Date   LDLCALC 80 02/26/2024   uncontrolled, pt to continue current statin lipitor 80 mg every day, declines other change today

## 2024-03-01 NOTE — Assessment & Plan Note (Signed)
 Improved with treatment today,  to f/u any worsening symptoms or concerns

## 2024-03-02 ENCOUNTER — Other Ambulatory Visit (HOSPITAL_COMMUNITY): Payer: Self-pay

## 2024-03-04 ENCOUNTER — Other Ambulatory Visit (HOSPITAL_COMMUNITY): Payer: Self-pay

## 2024-03-30 ENCOUNTER — Other Ambulatory Visit: Payer: Self-pay | Admitting: Cardiology

## 2024-03-30 DIAGNOSIS — I429 Cardiomyopathy, unspecified: Secondary | ICD-10-CM

## 2024-04-02 ENCOUNTER — Other Ambulatory Visit (HOSPITAL_COMMUNITY): Payer: Self-pay

## 2024-05-03 ENCOUNTER — Other Ambulatory Visit (HOSPITAL_COMMUNITY): Payer: Self-pay

## 2024-05-03 ENCOUNTER — Ambulatory Visit: Admitting: Internal Medicine

## 2024-05-03 ENCOUNTER — Encounter: Payer: Self-pay | Admitting: Internal Medicine

## 2024-05-03 VITALS — BP 112/60 | HR 71 | Ht 69.0 in | Wt 349.6 lb

## 2024-05-03 DIAGNOSIS — Z7984 Long term (current) use of oral hypoglycemic drugs: Secondary | ICD-10-CM

## 2024-05-03 DIAGNOSIS — E1165 Type 2 diabetes mellitus with hyperglycemia: Secondary | ICD-10-CM

## 2024-05-03 DIAGNOSIS — E785 Hyperlipidemia, unspecified: Secondary | ICD-10-CM

## 2024-05-03 DIAGNOSIS — E1159 Type 2 diabetes mellitus with other circulatory complications: Secondary | ICD-10-CM

## 2024-05-03 DIAGNOSIS — Z7985 Long-term (current) use of injectable non-insulin antidiabetic drugs: Secondary | ICD-10-CM

## 2024-05-03 DIAGNOSIS — E119 Type 2 diabetes mellitus without complications: Secondary | ICD-10-CM | POA: Insufficient documentation

## 2024-05-03 MED ORDER — DAPAGLIFLOZIN PROPANEDIOL 5 MG PO TABS
5.0000 mg | ORAL_TABLET | Freq: Every day | ORAL | 3 refills | Status: AC
  Filled 2024-05-03: qty 90, 90d supply, fill #0

## 2024-05-03 MED ORDER — METFORMIN HCL ER 500 MG PO TB24: 500.0000 mg | ORAL_TABLET | Freq: Two times a day (BID) | ORAL | Status: AC

## 2024-05-03 MED ORDER — FREESTYLE LIBRE 3 PLUS SENSOR MISC
1.0000 | 3 refills | Status: AC
  Filled 2024-05-03: qty 6, 90d supply, fill #0

## 2024-05-03 NOTE — Progress Notes (Signed)
 Patient ID: Tyler [name], male   DOB: Aug 16, 1975, 48 y.o.   MRN: [id number]  HPI: Tyler [name] is a 48 y.o.-year-old male, returning for follow-up for DM2, dx in 2013, non-insulin-dependent, uncontrolled, with complications (CAD, CHF, mild CKD). Pt. previously saw Dr. Kassie, but last visit with me 4 months ago.  Interim history: No increased urination, blurry vision, nausea, chest pain.  He prev. had diarrhea with metformin  so he divided the dose to twice a day. He still drinks sweet drinks - rarely now. He mentions that sweet tea is his weakness. He started a CGM since last visit.  Reviewed HbA1c: Lab Results  Component Value Date   HGBA1C 6.4 02/26/2024   HGBA1C 6.1 (A) 01/01/2024   HGBA1C 6.7 (H) 08/25/2023   HGBA1C 6.5 (A) 06/19/2023   HGBA1C 6.4 (A) 02/13/2023   HGBA1C 6.7 (H) 08/29/2022   HGBA1C 6.3 (A) 06/06/2022   HGBA1C 7.4 (A) 02/14/2022   HGBA1C 6.9 (A) 10/18/2021   HGBA1C 7.2 (A) 07/19/2021   Pt is on a regimen of: - Metformin  ER 2000 in am >> 1000 mg 2x a day - Farxiga  5 mg before breakfast - Rybelsus  14 mg before breakfast >> 14 mg 2x a day >> 28 mg before breakfast >> Ozempic  0.5 mg weekly He had diarrhea with regular metformin .  Pt checks his sugars with his CGM:  Previously: - am: 90-102 >> 105-115 >> 100-120s >> 89-115 - 2h after b'fast: n/c - before lunch: n/c - 2h after lunch: n/c >> 105 >> n/c - before dinner: n/c >> 100 >> n/c - 2h after dinner: n/c - bedtime: n/c - nighttime: n/c Lowest: 88 Highest: 120  Glucometer: Freestyle Lite  He was previously drinking 1 Pepsi a day.  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  He mentioned that water caused muscle cramps.  I strongly advised him to stop sweet drinks.  Now drinks 2 a week.  - + Mild CKD, last BUN/creatinine:  Lab Results  Component Value Date   BUN 12 02/26/2024   BUN 16 08/25/2023   CREATININE 0.98 02/26/2024   CREATININE 0.99 08/25/2023   Lab Results  Component  Value Date   MICRALBCREAT 5.3 08/28/2023  He is on Entresto .  -+ HL; last set of lipids: Lab Results  Component Value Date   CHOL 142 02/26/2024   HDL 36.60 (L) 02/26/2024   LDLCALC 80 02/26/2024   LDLDIRECT 183.6 08/02/2011   TRIG 127.0 02/26/2024   CHOLHDL 4 02/26/2024  On Lipitor 80 mg daily and Zetia  10 mg daily.  - last eye exam was 05/09/2022: No DR.  - no numbness and tingling in his feet.  Last foot exam 06/19/2023.  He also has a history of HTN, GERD, sleep apnea, asthmatic bronchitis.  He has a doctor, general practice.  ROS: + see HPI  Past Medical History:  Diagnosis Date   ACHILLES TENDINITIS, BILATERAL 10/16/2008   ALLERGIC RHINITIS 08/13/2009   CARDIOMYOPATHY 04/21/2009   CHF (congestive heart failure) (HCC)    CONGESTIVE HEART FAILURE 12/04/2007   CORONARY ARTERY DISEASE 12/04/2007   GERD (gastroesophageal reflux disease) 11/01/2011   HYPERLIPIDEMIA 03/12/2007   HYPERTENSION 03/12/2007   OBESITY 04/21/2009   Plantar fasciitis, bilateral 11/01/2011   SLEEP APNEA 03/12/2007   Past Surgical History:  Procedure Laterality Date   CARDIAC CATHETERIZATION N/A 12/19/2014   Procedure: Left Heart Cath and Coronary Angiography;  Surgeon: Victory LELON Sharps, MD;  Location: [facility] INVASIVE CV LAB;  Service: Cardiovascular;  Laterality: N/A;   COLONOSCOPY WITH PROPOFOL  N/A 11/19/2020   Procedure: COLONOSCOPY WITH PROPOFOL ;  Surgeon: Teressa Toribio SQUIBB, MD;  Location: [facility];  Service: Endoscopy;  Laterality: N/A;   NO PAST SURGERIES     Social History   Socioeconomic History   Marital status: Married    Spouse name: Not on file   Number of children: 3   Years of education: Not on file   Highest education level: Not on file  Occupational History   Occupation: Naval Architect Driver--[employer]    Employer: [employer]  Tobacco Use   Smoking status: Never   Smokeless tobacco: Never   Tobacco comments:    never  Vaping Use   Vaping status: Never Used   Substance and Sexual Activity   Alcohol use: No   Drug use: No   Sexual activity: Not on file  Other Topics Concern   Not on file  Social History Narrative   Not on file   Social Drivers of Health   Financial Resource Strain: Not on file  Food Insecurity: Not on file  Transportation Needs: Not on file  Physical Activity: Not on file  Stress: Not on file  Social Connections: Not on file  Intimate Partner Violence: Not on file   Current Outpatient Medications on File Prior to Visit  Medication Sig Dispense Refill   albuterol  (VENTOLIN  HFA) 108 (90 Base) MCG/ACT inhaler Inhale 2 puffs into the lungs every 6 (six) hours as needed for wheezing. 6.7 g 11   aspirin  EC 81 MG tablet Take 81 mg by mouth daily.     atorvastatin  (LIPITOR) 80 MG tablet Take 1 tablet by mouth once daily 90 tablet 1   Blood Glucose Monitoring Suppl (FREESTYLE LITE) w/Device KIT Use daily as directed to test blood glucose. 1 kit 3   Blood Glucose Monitoring Suppl (ONETOUCH VERIO REFLECT) w/Device KIT Use as advised 1 kit 0   budesonide -formoterol  (SYMBICORT ) 160-4.5 MCG/ACT inhaler INHALE 2 PUFFS BY MOUTH TWICE A DAY 10.2 g 12   carvedilol  (COREG ) 25 MG tablet TAKE 1 TABLET BY MOUTH TWICE DAILY WITH A MEAL 180 tablet 3   chlorpheniramine-HYDROcodone  (TUSSIONEX) 10-8 MG/5ML Take 5 mLs by mouth every 12 (twelve) hours as needed for cough. 70 mL 0   Cholecalciferol (VITAMIN D ) 50 MCG (2000 UT) tablet Take 2,000 Units by mouth daily.     Continuous Glucose Sensor (FREESTYLE LIBRE 3 PLUS SENSOR) MISC 1 each by Does not apply route every 14 (fourteen) days. 6 each 3   cyclobenzaprine  (FLEXERIL ) 5 MG tablet Take 1 tablet (5 mg total) by mouth 3 (three) times daily as needed. 40 tablet 1   dapagliflozin  propanediol (FARXIGA ) 5 MG TABS tablet Take 1 tablet (5 mg total) by mouth daily before breakfast. 90 tablet 3   ezetimibe  (ZETIA ) 10 MG tablet Take 1 tablet by mouth once daily 90 tablet 3   fluticasone  (FLONASE ) 50  MCG/ACT nasal spray Place 1 spray into both nostrils daily. 16 g 3   furosemide  (LASIX ) 20 MG tablet Take 1 tablet by mouth once daily 90 tablet 1   glucose blood (FREESTYLE LITE) test strip Use 1 strip to check blood glucose once daily as directed. 50 each 5   Lancets (FREESTYLE) lancets Use 1 lancet once daily as directed to test blood glucose. 100 each 12   Lancets MISC Use as directed twice per day E11.9 200 each 12   levocetirizine (XYZAL ) 5 MG tablet  Take 1 tablet (5 mg total) by mouth every evening. 30 tablet 11   metFORMIN  (GLUCOPHAGE -XR) 500 MG 24 hr tablet TAKE 2 TABLETS BY MOUTH TWICE DAILY WITH MEALS 360 tablet 1   Misc Natural Products (BEET ROOT PO) Take by mouth.     Multiple Vitamins-Minerals (MULTIVITAMIN WITH MINERALS) tablet Take 1 tablet by mouth daily.     nitroGLYCERIN  (NITROSTAT ) 0.4 MG SL tablet Place 1 tablet (0.4 mg total) under the tongue every 5 (five) minutes as needed for chest pain. 25 tablet 3   OneTouch Delica Lancets 33G MISC Use once a day 100 each 3   PRESCRIPTION MEDICATION Inhale into the lungs at bedtime. CPAP     sacubitril -valsartan  (ENTRESTO ) 97-103 MG Take 1 tablet by mouth 2 times daily. 180 tablet 1   Semaglutide ,0.25 or 0.5MG /DOS, 2 MG/3ML SOPN Inject 0.5 mg into the skin once a week. 6 mL 3   spironolactone  (ALDACTONE ) 25 MG tablet Take 1 tablet by mouth once daily 90 tablet 1   No current facility-administered medications on file prior to visit.   No Known Allergies Family History  Problem Relation Age of Onset   Hypertension Other    Heart attack Mother    Heart disease Mother    Diabetes Mother    Heart attack Father    Heart disease Father    Diabetes Father    Asthma Brother    Diabetes Maternal Grandfather    PE: BP 112/60   Pulse 71   Ht 5' 9 (1.753 m)   Wt (!) 349 lb 9.6 oz (158.6 kg)   SpO2 96%   BMI 51.63 kg/m  Wt Readings from Last 10 Encounters:  05/03/24 (!) 349 lb 9.6 oz (158.6 kg)  02/26/24 (!) 351 lb (159.2 kg)   01/01/24 (!) 345 lb 9.6 oz (156.8 kg)  09/21/23 (!) 365 lb 12.8 oz (165.9 kg)  08/28/23 (!) 371 lb 6 oz (168.5 kg)  08/21/23 (!) 372 lb 6.4 oz (168.9 kg)  07/10/23 (!) 369 lb (167.4 kg)  06/19/23 (!) 367 lb 3.2 oz (166.6 kg)  05/15/23 (!) 366 lb (166 kg)  02/27/23 (!) 361 lb 2 oz (163.8 kg)   Constitutional: overweight, in NAD Eyes: no exophthalmos ENT: no thyromegaly, no cervical lymphadenopathy Cardiovascular: RRR, No MRG, + pitting LE edema B Respiratory: CTA B Musculoskeletal: no deformities Skin:no rashes Neurological: no tremor with outstretched hands Diabetic Foot Exam - Simple   Simple Foot Form Diabetic Foot exam was performed with the following findings: Yes 05/03/2024  3:39 PM  Visual Inspection No deformities, no ulcerations, no other skin breakdown bilaterally: Yes Sensation Testing Intact to touch and monofilament testing bilaterally: Yes Pulse Check Posterior Tibialis and Dorsalis pulse intact bilaterally: Yes Comments    ASSESSMENT: 1. DM2, non-insulin-dependent, uncontrolled, with complications - CAD - CHF - Nonischemic cardiomyopathy  2. HL  3.  Obesity class III  PLAN:  1. Patient with longstanding, uncontrolled type 2 diabetes, with improved control on metformin , SGLT2 inhibitor and low-dose GLP-1 receptor agonist.  He was previously on Rybelsus  but currently on Ozempic , tolerated well.  He lost more than 20 pounds after starting it.  Since last visit he was about to attach his CGM. CGM interpretation: -At today's visit, we reviewed his CGM downloads: It appears that 100% of values are in target range (goal >70%), while 0% are higher than 180 (goal <25%), and 0% are lower than 70 (goal <4%).  The calculated average blood sugar is 99.  The  projected HbA1c for the next 3 months (GMI) is 5.7%. -Reviewing the CGM trends, sugars are excellent, fluctuating within the target range without hyper or hypoglycemic excursions.  We discussed about reducing the dose  of his metformin  for now.  At next visit, depending on his weight, we may need to increase the Ozempic  dose. -I again underlined the need to stay off sweet drinks - I suggested to:  Patient Instructions  Please decrease: - Metformin  ER 500 mg 2x a day  Please continue: - Farxiga  5 mg before breakfast - Ozempic  0.5 mg weekly    Please return for another visit in 4-6 months.  - we checked his HbA1c: 5.7% (lower) - advised to check sugars at different times of the day - 4x a day, rotating check times - advised for yearly eye exams >> he is not UTD - return to clinic in 3-4 months  2. HL - Latest lipid panel showed an LDL above our target of less than 55, but much improved from baseline, and also low HDL: Lab Results  Component Value Date   CHOL 142 02/26/2024   HDL 36.60 (L) 02/26/2024   LDLCALC 80 02/26/2024   LDLDIRECT 183.6 08/02/2011   TRIG 127.0 02/26/2024   CHOLHDL 4 02/26/2024  -He continues Lipitor 80 mg daily and Zetia  10 mg daily without side effects  3.  Obesity class III - will continue the SGLT2 inhibitor and GLP-1 receptor agonist, which should also help with weight loss - He Lost 22 pounds after switching from Rybelsus  to Ozempic  -we continue to the same dose at last visit and again today but may need to increase it at next visit  Lela Fendt, MD PhD [facility] Endocrinology

## 2024-05-03 NOTE — Patient Instructions (Addendum)
 Please decrease: - Metformin  ER 500 mg 2x a day  Please continue: - Farxiga  5 mg before breakfast - Ozempic  0.5 mg weekly    Please return for another visit in 4-6 months.

## 2024-05-07 ENCOUNTER — Other Ambulatory Visit (HOSPITAL_COMMUNITY): Payer: Self-pay

## 2024-05-26 ENCOUNTER — Other Ambulatory Visit (HOSPITAL_COMMUNITY): Payer: Self-pay

## 2024-06-07 ENCOUNTER — Other Ambulatory Visit (HOSPITAL_COMMUNITY): Payer: Self-pay

## 2024-06-14 ENCOUNTER — Ambulatory Visit: Payer: Self-pay | Admitting: Internal Medicine

## 2024-06-14 DIAGNOSIS — R0600 Dyspnea, unspecified: Secondary | ICD-10-CM | POA: Diagnosis not present

## 2024-06-14 DIAGNOSIS — J189 Pneumonia, unspecified organism: Secondary | ICD-10-CM | POA: Diagnosis not present

## 2024-06-14 DIAGNOSIS — R509 Fever, unspecified: Secondary | ICD-10-CM | POA: Diagnosis not present

## 2024-06-14 DIAGNOSIS — R5381 Other malaise: Secondary | ICD-10-CM | POA: Diagnosis not present

## 2024-06-14 DIAGNOSIS — R051 Acute cough: Secondary | ICD-10-CM | POA: Diagnosis not present

## 2024-06-14 NOTE — Telephone Encounter (Signed)
 FYI Only or Action Required?: FYI only for provider: Patient going to UC.  Patient is followed in Pulmonology for chronic allergic rhinits, last seen on 09/21/2023 by Meade Verdon RAMAN, MD.  Called Nurse Triage reporting Cough.  Symptoms began several days ago.  Interventions attempted: Prescription medications: Albuterol  and symbicort , chlortheniramine hydrocodine 10/-8, Budesonide  & Formoterol .  Symptoms are: unchanged.  Triage Disposition: See Physician Within 24 Hours  Patient/caregiver understands and will follow disposition?: Yes  FYI Only or Action Required?: FYI only for provider: Patient going to UC.  Patient was last seen in primary care on 02/26/2024 by Norleen Lynwood ORN, MD.  Called Nurse Triage reporting Cough.  Symptoms began several days ago.  Interventions attempted: Prescription medications: Albuterol  and symbicort , chlortheniramine hydrocodine 10/-8, Budesonide  & Formoterol .  Symptoms are: unchanged.  Triage Disposition: See Physician Within 24 Hours  Patient/caregiver understands and will follow disposition?: Yes  Copied from CRM #[id number]. Topic: Clinical - Red Word Triage >> Jun 14, 2024 11:16 AM Russell PARAS wrote: Red Word that prompted transfer to Nurse Triage:   Began having flare Christmas Eve  Wheezing Severe cough Difficulty breathing   Pt of Young Reason for Disposition  SEVERE coughing spells (e.g., whooping sound after coughing, vomiting after coughing)  Answer Assessment - Initial Assessment Questions Albuterol  and symbicort , chlortheniramine hydrocodine 10/-8, Budesonide  & Formoterol . All Pulm clinics closed today. No appointments with PCP till 04/17/2024. Patient going to go to UC. 1. ONSET: When did the cough begin?      06/12/2024 2. SEVERITY: How bad is the cough today?      Bad, he is throwing up because he is coughing 3. SPUTUM: Describe the color of your sputum (e.g., none, dry cough; clear, white, yellow, green)     Brown 4.  HEMOPTYSIS: Are you coughing up any blood? If Yes, ask: How much? (e.g., flecks, streaks, tablespoons, etc.)     Denies 5. DIFFICULTY BREATHING: Are you having difficulty breathing? If Yes, ask: How bad is it? (e.g., mild, moderate, severe)      Only when he gets to coughing. Denies any change in breathing other than these coughing fits. Denies SOB when walking. 6. FEVER: Do you have a fever? If Yes, ask: What is your temperature, how was it measured, and when did it start?     Denies 7. CARDIAC HISTORY: Do you have any history of heart disease? (e.g., heart attack, congestive heart failure)      HTN 8. LUNG HISTORY: Do you have any history of lung disease?  (e.g., pulmonary embolus, asthma, emphysema)     Think asthma 9. PE RISK FACTORS: Do you have a history of blood clots? (or: recent major surgery, recent prolonged travel, bedridden)     Denies 10. OTHER SYMPTOMS: Do you have any other symptoms? (e.g., runny nose, wheezing, chest pain) SOB from coughing and trouble catching his breath. Says he feels like he has something in his throat but he can't cough it up. headache, stomach pain from coughing.  Protocols used: Cough - Acute Non-Productive-A-AH

## 2024-06-17 ENCOUNTER — Other Ambulatory Visit (HOSPITAL_COMMUNITY): Payer: Self-pay

## 2024-06-17 ENCOUNTER — Ambulatory Visit: Admitting: Internal Medicine

## 2024-06-17 NOTE — Progress Notes (Signed)
 "              Tyler [name]    [id number]    Jan 06, 1976  Primary Care Physician:John, Lynwood LELON, MD Date of Appointment: 06/17/2024 Established Patient Visit  Chief complaint:   No chief complaint on file.    HPI: Tyler [name] is a 48 y.o. man with OSA on CPAP, CHF, chronic rhinitis.  Interval Updates: Follows with Dr. Neysa for OSA and takes symbicort  for chronic bronchitis.   Here for an acute visit.  Went to urgent care and was told it was allergies and covid/flu negative.  They gave him ipratropium and augmentin.  Coughing at night. Having post nasal drip He says the medications I gave him back in January helped a lot but he didn't know he was supposed to keep taking them.  He was given a symbicort  inhaler for shortness of breath. It seems to be helping.  He also has an albuterol  inhaler which helps his breathing.  I have reviewed the patient's family social and past medical history and updated as appropriate.   Past Medical History:  Diagnosis Date   ACHILLES TENDINITIS, BILATERAL 10/16/2008   ALLERGIC RHINITIS 08/13/2009   CARDIOMYOPATHY 04/21/2009   CHF (congestive heart failure) (HCC)    CONGESTIVE HEART FAILURE 12/04/2007   CORONARY ARTERY DISEASE 12/04/2007   GERD (gastroesophageal reflux disease) 11/01/2011   HYPERLIPIDEMIA 03/12/2007   HYPERTENSION 03/12/2007   OBESITY 04/21/2009   Plantar fasciitis, bilateral 11/01/2011   SLEEP APNEA 03/12/2007    Past Surgical History:  Procedure Laterality Date   CARDIAC CATHETERIZATION N/A 12/19/2014   Procedure: Left Heart Cath and Coronary Angiography;  Surgeon: Victory LELON Sharps, MD;  Location: [facility] INVASIVE CV LAB;  Service: Cardiovascular;  Laterality: N/A;   COLONOSCOPY WITH PROPOFOL  N/A 11/19/2020   Procedure: COLONOSCOPY WITH PROPOFOL ;  Surgeon: Teressa Toribio SQUIBB, MD;  Location: [facility] ENDOSCOPY;  Service: Endoscopy;  Laterality: N/A;   NO PAST SURGERIES      Family History  Problem Relation Age of Onset   Hypertension  Other    Heart attack Mother    Heart disease Mother    Diabetes Mother    Heart attack Father    Heart disease Father    Diabetes Father    Asthma Brother    Diabetes Maternal Grandfather     Social History   Occupational History   Occupation: Engineer, Maintenance OF [place]    Employer: [employer]  Tobacco Use   Smoking status: Never   Smokeless tobacco: Never   Tobacco comments:    never  Vaping Use   Vaping status: Never Used  Substance and Sexual Activity   Alcohol use: No   Drug use: No   Sexual activity: Not on file     Physical Exam: There were no vitals taken for this visit.  Gen:      No distress, obese ENT:  +cobblestoning in oropharynx Lungs:    no increased work of breathing ctab. No wheeze CV:         RRR no mrg   Data Reviewed: Imaging: I have personally reviewed the   PFTs:     Latest Ref Rng & Units 12/17/2014   11:50 AM  PFT Results  FVC-Pre L 3.71   FVC-Predicted Pre % 88   FVC-Post L 3.96   FVC-Predicted Post % 93   Pre FEV1/FVC % % 82   Post FEV1/FCV % % 79   FEV1-Pre L  3.03   FEV1-Predicted Pre % 87   FEV1-Post L 3.14   DLCO uncorrected ml/min/mmHg 33.90   DLCO UNC% % 109   DLVA Predicted % 128   TLC L 5.45   TLC % Predicted % 81   RV % Predicted % 101    I have personally reviewed the patient's PFTs and normal pulmonary function  Labs: Lab Results  Component Value Date   WBC 7.9 08/25/2023   HGB 13.9 08/25/2023   HCT 43.0 08/25/2023   MCV 88.2 08/25/2023   PLT 223.0 08/25/2023   Lab Results  Component Value Date   NA 139 02/26/2024   K 3.9 02/26/2024   CL 103 02/26/2024   CO2 29 02/26/2024    Immunization status: Immunization History  Administered Date(s) Administered   Influenza Whole 04/01/2009, 03/09/2010   Influenza, Seasonal, Injecte, Preservative Fre 02/27/2023, 02/26/2024   Influenza,inj,Quad PF,6+ Mos 04/27/2013, 02/11/2014, 06/09/2015, 04/05/2016, 07/18/2017, 06/22/2018, 04/06/2019,  03/25/2021, 02/28/2022   PFIZER(Purple Top)SARS-COV-2 Vaccination 08/29/2019, 09/19/2019, 05/12/2020   PNEUMOCOCCAL CONJUGATE-20 01/24/2022   Pneumococcal Polysaccharide-23 10/21/2014   Td 10/16/2008   Tdap 08/28/2018    External Records Personally Reviewed: pulmonary, pcp  Assessment:  Chronic cough Most likely secondary to seasonal allergic rhinitis Mild persistent asthma without exacerbation  Plan/Recommendations: I think your cough is related to post nasal drainage.   Start taking Flonase  again 1 spray each side your nose twice a day. Start taking Xyzal  which is the antihistamine to help with his allergies.  You can take the Symbicort  2 puffs in the morning 2 puffs at night, gargle after use.  In between there if you get short of breath you can take the albuterol  inhaler as needed.  The allergies can make the asthma worse and make sure you stay on top of it.  Return to Care: No follow-ups on file.    Verdon Gore, MD Pulmonary and Critical Care Medicine [facility] Office:[phone]      "

## 2024-06-17 NOTE — Telephone Encounter (Signed)
 Noted.    Patient has not been seen since April of 2025  was requested to do f/u in four months over due for a f/u

## 2024-06-18 ENCOUNTER — Ambulatory Visit

## 2024-06-18 ENCOUNTER — Other Ambulatory Visit (HOSPITAL_COMMUNITY): Payer: Self-pay

## 2024-06-18 VITALS — BP 101/68 | HR 85 | Temp 98.1°F | Ht 69.0 in | Wt 349.6 lb

## 2024-06-18 DIAGNOSIS — G4733 Obstructive sleep apnea (adult) (pediatric): Secondary | ICD-10-CM

## 2024-06-18 DIAGNOSIS — J4530 Mild persistent asthma, uncomplicated: Secondary | ICD-10-CM | POA: Diagnosis not present

## 2024-06-18 DIAGNOSIS — J189 Pneumonia, unspecified organism: Secondary | ICD-10-CM

## 2024-06-18 DIAGNOSIS — J45909 Unspecified asthma, uncomplicated: Secondary | ICD-10-CM

## 2024-06-18 MED ORDER — ALBUTEROL SULFATE HFA 108 (90 BASE) MCG/ACT IN AERS
2.0000 | INHALATION_SPRAY | Freq: Four times a day (QID) | RESPIRATORY_TRACT | 4 refills | Status: AC | PRN
  Filled 2024-06-18: qty 6.7, 25d supply, fill #0

## 2024-06-18 MED ORDER — BUDESONIDE-FORMOTEROL FUMARATE 160-4.5 MCG/ACT IN AERO
2.0000 | INHALATION_SPRAY | Freq: Two times a day (BID) | RESPIRATORY_TRACT | 4 refills | Status: AC
  Filled 2024-06-18: qty 10.2, 30d supply, fill #0

## 2024-06-18 NOTE — Patient Instructions (Addendum)
 Dear [name]  I will recommend the following:  -For pneumonia Continue antibiotics and prednisone . I will repeat a chest xray at the next visit  -For asthma: If symptoms are more frequent, please start using the symbicort  2 puffs twice a day.  Albuterol  as need it.  I request the mask for your cpap machine, they will call you for information.    I will see you in 3 months.

## 2024-07-05 ENCOUNTER — Other Ambulatory Visit (HOSPITAL_COMMUNITY): Payer: Self-pay

## 2024-07-15 ENCOUNTER — Other Ambulatory Visit: Payer: Self-pay | Admitting: Internal Medicine

## 2024-07-16 ENCOUNTER — Other Ambulatory Visit (HOSPITAL_COMMUNITY): Payer: Self-pay

## 2024-07-16 ENCOUNTER — Other Ambulatory Visit: Payer: Self-pay

## 2024-07-16 MED ORDER — LEVOCETIRIZINE DIHYDROCHLORIDE 5 MG PO TABS
5.0000 mg | ORAL_TABLET | Freq: Every evening | ORAL | 11 refills | Status: AC
  Filled 2024-07-16: qty 30, 30d supply, fill #0

## 2024-07-23 LAB — OPHTHALMOLOGY REPORT-SCANNED

## 2024-08-26 ENCOUNTER — Ambulatory Visit: Admitting: Internal Medicine

## 2024-09-02 ENCOUNTER — Ambulatory Visit: Admitting: Cardiology

## 2024-09-03 ENCOUNTER — Encounter

## 2024-09-04 ENCOUNTER — Encounter

## 2024-09-04 ENCOUNTER — Ambulatory Visit

## 2024-09-13 ENCOUNTER — Ambulatory Visit: Admitting: Internal Medicine
